# Patient Record
Sex: Male | Born: 1955 | Race: Black or African American | Hispanic: No | Marital: Married | Smoking: Former smoker
Health system: Southern US, Community
[De-identification: ages and names within clinical notes are randomized; demographics above are authoritative.]

## PROBLEM LIST (undated history)

## (undated) ENCOUNTER — Emergency Department (HOSPITAL_COMMUNITY): Payer: Medicare Other | Source: Home / Self Care

## (undated) DIAGNOSIS — G4730 Sleep apnea, unspecified: Secondary | ICD-10-CM

## (undated) DIAGNOSIS — T7840XA Allergy, unspecified, initial encounter: Secondary | ICD-10-CM

## (undated) DIAGNOSIS — H269 Unspecified cataract: Secondary | ICD-10-CM

## (undated) DIAGNOSIS — E669 Obesity, unspecified: Secondary | ICD-10-CM

## (undated) DIAGNOSIS — N529 Male erectile dysfunction, unspecified: Secondary | ICD-10-CM

## (undated) DIAGNOSIS — J302 Other seasonal allergic rhinitis: Secondary | ICD-10-CM

## (undated) DIAGNOSIS — C801 Malignant (primary) neoplasm, unspecified: Secondary | ICD-10-CM

## (undated) DIAGNOSIS — I10 Essential (primary) hypertension: Secondary | ICD-10-CM

## (undated) DIAGNOSIS — K648 Other hemorrhoids: Secondary | ICD-10-CM

## (undated) DIAGNOSIS — Z8619 Personal history of other infectious and parasitic diseases: Secondary | ICD-10-CM

## (undated) DIAGNOSIS — M1710 Unilateral primary osteoarthritis, unspecified knee: Secondary | ICD-10-CM

## (undated) DIAGNOSIS — E119 Type 2 diabetes mellitus without complications: Secondary | ICD-10-CM

## (undated) HISTORY — DX: Sleep apnea, unspecified: G47.30

## (undated) HISTORY — PX: CARPAL TUNNEL RELEASE: SHX101

## (undated) HISTORY — PX: UMBILICAL HERNIA REPAIR: SHX196

## (undated) HISTORY — DX: Unilateral primary osteoarthritis, unspecified knee: M17.10

## (undated) HISTORY — DX: Male erectile dysfunction, unspecified: N52.9

## (undated) HISTORY — DX: Allergy, unspecified, initial encounter: T78.40XA

## (undated) HISTORY — DX: Essential (primary) hypertension: I10

## (undated) HISTORY — PX: HERNIA REPAIR: SHX51

## (undated) HISTORY — DX: Other seasonal allergic rhinitis: J30.2

## (undated) HISTORY — DX: Obesity, unspecified: E66.9

## (undated) HISTORY — DX: Type 2 diabetes mellitus without complications: E11.9

## (undated) HISTORY — DX: Unspecified cataract: H26.9

## (undated) SURGERY — Surgical Case
Anesthesia: *Unknown

---

## 1988-09-14 DIAGNOSIS — I10 Essential (primary) hypertension: Secondary | ICD-10-CM

## 1988-09-14 HISTORY — DX: Essential (primary) hypertension: I10

## 2002-09-14 DIAGNOSIS — G4730 Sleep apnea, unspecified: Secondary | ICD-10-CM

## 2002-09-14 HISTORY — DX: Sleep apnea, unspecified: G47.30

## 2003-11-16 ENCOUNTER — Ambulatory Visit (HOSPITAL_COMMUNITY): Admission: RE | Admit: 2003-11-16 | Discharge: 2003-11-16 | Payer: Self-pay | Admitting: General Surgery

## 2004-08-13 ENCOUNTER — Ambulatory Visit: Payer: Self-pay | Admitting: Family Medicine

## 2004-11-11 ENCOUNTER — Ambulatory Visit: Payer: Self-pay | Admitting: Family Medicine

## 2004-12-23 ENCOUNTER — Ambulatory Visit: Payer: Self-pay | Admitting: Family Medicine

## 2005-02-16 ENCOUNTER — Ambulatory Visit: Payer: Self-pay | Admitting: Family Medicine

## 2005-05-20 ENCOUNTER — Ambulatory Visit: Payer: Self-pay | Admitting: Family Medicine

## 2005-08-18 ENCOUNTER — Ambulatory Visit: Payer: Self-pay | Admitting: Family Medicine

## 2005-11-23 ENCOUNTER — Ambulatory Visit: Payer: Self-pay | Admitting: Family Medicine

## 2006-03-03 ENCOUNTER — Ambulatory Visit: Payer: Self-pay | Admitting: Internal Medicine

## 2006-03-26 ENCOUNTER — Ambulatory Visit: Payer: Self-pay | Admitting: Family Medicine

## 2006-05-26 ENCOUNTER — Encounter: Payer: Self-pay | Admitting: Family Medicine

## 2006-05-26 LAB — CONVERTED CEMR LAB: PSA: 1.05 ng/mL

## 2006-06-01 ENCOUNTER — Ambulatory Visit: Payer: Self-pay | Admitting: Family Medicine

## 2006-09-30 ENCOUNTER — Encounter: Payer: Self-pay | Admitting: Family Medicine

## 2006-09-30 LAB — CONVERTED CEMR LAB: Hgb A1c MFr Bld: 6 % (ref 4.6–6.1)

## 2006-10-04 ENCOUNTER — Ambulatory Visit: Payer: Self-pay | Admitting: Family Medicine

## 2007-01-28 ENCOUNTER — Ambulatory Visit: Payer: Self-pay | Admitting: Family Medicine

## 2007-05-12 ENCOUNTER — Ambulatory Visit: Payer: Self-pay | Admitting: Family Medicine

## 2007-08-17 ENCOUNTER — Ambulatory Visit: Payer: Self-pay | Admitting: Family Medicine

## 2007-08-17 LAB — CONVERTED CEMR LAB: PSA: 1.31 ng/mL

## 2007-08-18 ENCOUNTER — Encounter: Payer: Self-pay | Admitting: Family Medicine

## 2007-08-18 LAB — CONVERTED CEMR LAB
BUN: 15 mg/dL (ref 6–23)
Basophils Absolute: 0 10*3/uL (ref 0.0–0.1)
Basophils Relative: 0 % (ref 0–1)
CO2: 27 meq/L (ref 19–32)
Calcium: 9 mg/dL (ref 8.4–10.5)
Chloride: 102 meq/L (ref 96–112)
Creatinine, Ser: 0.96 mg/dL (ref 0.40–1.50)
Glucose, Bld: 98 mg/dL (ref 70–99)
HCT: 41.6 % (ref 39.0–52.0)
Hemoglobin: 14 g/dL (ref 13.0–17.0)
LDL Cholesterol: 91 mg/dL (ref 0–99)
Lymphocytes Relative: 35 % (ref 12–46)
Monocytes Relative: 9 % (ref 3–12)
Platelets: 223 10*3/uL (ref 150–400)
Potassium: 3.7 meq/L (ref 3.5–5.3)
RDW: 13.4 % (ref 11.5–15.5)
Sodium: 140 meq/L (ref 135–145)
TSH: 1.119 microintl units/mL (ref 0.350–5.50)
Triglycerides: 72 mg/dL (ref <150)
Uric Acid, Serum: 5.3 mg/dL (ref 2.4–7.0)
WBC: 3.7 10*3/uL — ABNORMAL LOW (ref 4.0–10.5)

## 2007-10-04 ENCOUNTER — Ambulatory Visit (HOSPITAL_COMMUNITY): Admission: RE | Admit: 2007-10-04 | Discharge: 2007-10-04 | Payer: Self-pay | Admitting: General Surgery

## 2007-11-09 ENCOUNTER — Ambulatory Visit: Payer: Self-pay | Admitting: Internal Medicine

## 2007-11-09 DIAGNOSIS — J302 Other seasonal allergic rhinitis: Secondary | ICD-10-CM

## 2007-11-09 DIAGNOSIS — G4733 Obstructive sleep apnea (adult) (pediatric): Secondary | ICD-10-CM

## 2007-11-09 DIAGNOSIS — I10 Essential (primary) hypertension: Secondary | ICD-10-CM | POA: Insufficient documentation

## 2007-11-09 DIAGNOSIS — G4710 Hypersomnia, unspecified: Secondary | ICD-10-CM | POA: Insufficient documentation

## 2007-11-09 HISTORY — DX: Obstructive sleep apnea (adult) (pediatric): G47.33

## 2007-11-28 ENCOUNTER — Ambulatory Visit: Payer: Self-pay | Admitting: Family Medicine

## 2007-12-02 ENCOUNTER — Encounter: Payer: Self-pay | Admitting: Family Medicine

## 2007-12-02 DIAGNOSIS — G5600 Carpal tunnel syndrome, unspecified upper limb: Secondary | ICD-10-CM

## 2007-12-09 ENCOUNTER — Ambulatory Visit: Payer: Self-pay | Admitting: Internal Medicine

## 2008-01-22 ENCOUNTER — Encounter: Payer: Self-pay | Admitting: Internal Medicine

## 2008-03-28 ENCOUNTER — Ambulatory Visit: Payer: Self-pay | Admitting: Family Medicine

## 2008-05-07 ENCOUNTER — Encounter: Payer: Self-pay | Admitting: Family Medicine

## 2008-06-11 ENCOUNTER — Ambulatory Visit: Payer: Self-pay | Admitting: Internal Medicine

## 2008-08-15 ENCOUNTER — Encounter: Payer: Self-pay | Admitting: Family Medicine

## 2008-08-17 ENCOUNTER — Encounter: Payer: Self-pay | Admitting: Family Medicine

## 2008-08-17 LAB — CONVERTED CEMR LAB
BUN: 13 mg/dL (ref 6–23)
Basophils Relative: 0 % (ref 0–1)
CO2: 23 meq/L (ref 19–32)
Chloride: 102 meq/L (ref 96–112)
Creatinine, Ser: 0.93 mg/dL (ref 0.40–1.50)
HDL: 41 mg/dL (ref >39)
LDL Cholesterol: 109 mg/dL — ABNORMAL HIGH (ref 0–99)
MCV: 88.1 fL (ref 78.0–100.0)
Monocytes Absolute: 0.3 10*3/uL (ref 0.1–1.0)
PSA: 1.79 ng/mL (ref 0.10–4.00)
Potassium: 3.4 meq/L — ABNORMAL LOW (ref 3.5–5.3)
Triglycerides: 80 mg/dL (ref <150)
Uric Acid, Serum: 5.5 mg/dL (ref 4.0–7.8)
VLDL: 16 mg/dL (ref 0–40)

## 2008-08-23 ENCOUNTER — Ambulatory Visit: Payer: Self-pay | Admitting: Family Medicine

## 2008-08-23 LAB — CONVERTED CEMR LAB: Glucose, Bld: 103 mg/dL

## 2008-08-28 ENCOUNTER — Encounter: Payer: Self-pay | Admitting: Family Medicine

## 2008-09-04 ENCOUNTER — Ambulatory Visit: Payer: Self-pay | Admitting: Internal Medicine

## 2008-09-17 ENCOUNTER — Encounter: Payer: Self-pay | Admitting: Family Medicine

## 2008-11-13 ENCOUNTER — Encounter: Payer: Self-pay | Admitting: Family Medicine

## 2008-11-27 ENCOUNTER — Encounter: Payer: Self-pay | Admitting: Family Medicine

## 2008-11-27 LAB — CONVERTED CEMR LAB
CO2: 25 meq/L (ref 19–32)
Chloride: 103 meq/L (ref 96–112)
Cholesterol: 141 mg/dL (ref 0–200)
Creatinine, Ser: 0.86 mg/dL (ref 0.40–1.50)
Glucose, Bld: 82 mg/dL (ref 70–99)
HDL: 38 mg/dL — ABNORMAL LOW (ref >39)
LDL Cholesterol: 89 mg/dL (ref 0–99)
Sodium: 140 meq/L (ref 135–145)
Total CHOL/HDL Ratio: 3.7
Triglycerides: 72 mg/dL (ref <150)

## 2008-12-12 ENCOUNTER — Encounter: Payer: Self-pay | Admitting: Family Medicine

## 2008-12-24 ENCOUNTER — Encounter: Payer: Self-pay | Admitting: Family Medicine

## 2009-01-03 ENCOUNTER — Ambulatory Visit: Payer: Self-pay | Admitting: Family Medicine

## 2009-04-12 ENCOUNTER — Encounter: Payer: Self-pay | Admitting: Family Medicine

## 2009-05-07 ENCOUNTER — Ambulatory Visit: Payer: Self-pay | Admitting: Family Medicine

## 2009-05-07 LAB — CONVERTED CEMR LAB: Hgb A1c MFr Bld: 6.4 %

## 2009-07-02 ENCOUNTER — Encounter: Payer: Self-pay | Admitting: Family Medicine

## 2009-08-21 ENCOUNTER — Ambulatory Visit: Payer: Self-pay | Admitting: Internal Medicine

## 2009-09-05 ENCOUNTER — Telehealth: Payer: Self-pay | Admitting: Internal Medicine

## 2009-09-11 ENCOUNTER — Telehealth (INDEPENDENT_AMBULATORY_CARE_PROVIDER_SITE_OTHER): Payer: Self-pay | Admitting: *Deleted

## 2009-09-14 HISTORY — PX: JOINT REPLACEMENT: SHX530

## 2009-10-16 LAB — CONVERTED CEMR LAB
BUN: 15 mg/dL (ref 6–23)
Calcium: 8.4 mg/dL (ref 8.4–10.5)
Chloride: 104 meq/L (ref 96–112)
Creatinine, Ser: 1 mg/dL (ref 0.40–1.50)
Creatinine, Urine: 342.5 mg/dL
Eosinophils Absolute: 0.1 10*3/uL (ref 0.0–0.7)
Glucose, Bld: 95 mg/dL (ref 70–99)
Hemoglobin: 13.3 g/dL (ref 13.0–17.0)
Lymphocytes Relative: 38 % (ref 12–46)
MCHC: 32.2 g/dL (ref 30.0–36.0)
MCV: 91.6 fL (ref 78.0–100.0)
Monocytes Absolute: 0.3 10*3/uL (ref 0.1–1.0)
Monocytes Relative: 9 % (ref 3–12)
PSA: 1.52 ng/mL (ref 0.10–4.00)
TSH: 1.297 microintl units/mL (ref 0.350–4.500)
WBC: 3.7 10*3/uL — ABNORMAL LOW (ref 4.0–10.5)

## 2009-10-21 ENCOUNTER — Ambulatory Visit: Payer: Self-pay | Admitting: Family Medicine

## 2009-10-30 DIAGNOSIS — M129 Arthropathy, unspecified: Secondary | ICD-10-CM

## 2009-11-03 ENCOUNTER — Encounter: Payer: Self-pay | Admitting: Internal Medicine

## 2010-01-27 ENCOUNTER — Telehealth: Payer: Self-pay | Admitting: Family Medicine

## 2010-02-11 ENCOUNTER — Encounter: Payer: Self-pay | Admitting: Family Medicine

## 2010-02-19 ENCOUNTER — Ambulatory Visit (HOSPITAL_COMMUNITY): Admission: RE | Admit: 2010-02-19 | Discharge: 2010-02-19 | Payer: Self-pay | Admitting: Family Medicine

## 2010-02-19 ENCOUNTER — Ambulatory Visit: Payer: Self-pay | Admitting: Family Medicine

## 2010-02-20 LAB — CONVERTED CEMR LAB: Vit D, 25-Hydroxy: 26 ng/mL — ABNORMAL LOW (ref 30–89)

## 2010-03-26 ENCOUNTER — Inpatient Hospital Stay (HOSPITAL_COMMUNITY): Admission: RE | Admit: 2010-03-26 | Discharge: 2010-03-28 | Payer: Self-pay | Admitting: Orthopedic Surgery

## 2010-06-14 HISTORY — PX: OTHER SURGICAL HISTORY: SHX169

## 2010-06-26 ENCOUNTER — Ambulatory Visit: Payer: Self-pay | Admitting: Family Medicine

## 2010-06-26 DIAGNOSIS — E669 Obesity, unspecified: Secondary | ICD-10-CM

## 2010-06-26 DIAGNOSIS — E1169 Type 2 diabetes mellitus with other specified complication: Secondary | ICD-10-CM | POA: Insufficient documentation

## 2010-06-27 ENCOUNTER — Encounter: Payer: Self-pay | Admitting: Family Medicine

## 2010-08-20 ENCOUNTER — Ambulatory Visit: Payer: Self-pay | Admitting: Internal Medicine

## 2010-09-23 ENCOUNTER — Telehealth: Payer: Self-pay | Admitting: Internal Medicine

## 2010-09-24 LAB — CONVERTED CEMR LAB
Bilirubin, Direct: 0.1 mg/dL (ref 0.0–0.3)
CO2: 27 meq/L (ref 19–32)
Cholesterol: 159 mg/dL (ref 0–200)
Creatinine, Ser: 0.96 mg/dL (ref 0.40–1.50)
Indirect Bilirubin: 0.6 mg/dL (ref 0.0–0.9)
LDL Cholesterol: 102 mg/dL — ABNORMAL HIGH (ref 0–99)
Potassium: 3.6 meq/L (ref 3.5–5.3)
Sodium: 139 meq/L (ref 135–145)
Total CHOL/HDL Ratio: 3.9
Total Protein: 7.4 g/dL (ref 6.0–8.3)
VLDL: 16 mg/dL (ref 0–40)

## 2010-10-07 ENCOUNTER — Ambulatory Visit
Admission: RE | Admit: 2010-10-07 | Discharge: 2010-10-07 | Payer: Self-pay | Source: Home / Self Care | Attending: Family Medicine | Admitting: Family Medicine

## 2010-10-14 NOTE — Progress Notes (Signed)
Summary: MURPHY/ WAINER  MURPHY/ Thurston [facility]   Imported By: Lind Guest 02/19/2010 09:54:22  _____________________________________________________________________  External Attachment:    Type:   Image     Comment:   External Document

## 2010-10-14 NOTE — Assessment & Plan Note (Signed)
Summary: F UP   Vital Signs:  Patient profile:   55 year old male Height:      66.5 inches Weight:      289.25 pounds BMI:     46.15 O2 Sat:      97 % Pulse rate:   96 / minute Pulse rhythm:   regular Resp:     16 per minute BP sitting:   120 / 82  (left arm) Cuff size:   large  Vitals Entered By: Everitt Amber LPN (June 26, 2010 7:58 AM)  Nutrition Counseling: Patient's BMI is greater than 25 and therefore counseled on weight management options. CC: Follow up chronic problems   Primary Care Provider:  M. [name]  CC:  Follow up chronic problems.  History of Present Illness: Reports  that he has been  doing well. Denies recent fever or chills. Denies sinus pressure, nasal congestion , ear pain or sore throat. Denies chest congestion, or cough productive of sputum. Denies chest pain, palpitations, PND, orthopnea or leg swelling. Denies abdominal pain, nausea, vomitting, diarrhea or constipation. Denies change in bowel movements or bloody stool. Denies dysuria , frequency, incontinence or hesitancy. Denies  joint pain, swelling, or reduced mobility.Reports good result from recent right knee replacement Denies headaches, vertigo, seizures. Denies depression, anxiety or insomnia. Denies  rash, lesions, or itch.     Allergies (verified): No Known Drug Allergies  Past History:  Past medical, surgical, family and social histories (including risk factors) reviewed, and no changes noted (except as noted below).  Past Medical History: Reviewed history from 06/11/2008 and no changes required. Allergic Rhinitis Diabetes Hypertension Sleep Apnea- NPSG 08/26/04 AHI 92/hr in [place]  Past Surgical History: Right Carpal tunnel 1994 Umbilical hernia 2005 Right knee replacement 2011  Family History: Reviewed history from 12/02/2007 and no changes required. MOTHER  DECEASED 77  HTN  LUNG DISEASE FATHER DECEASED 78 COLON CANCER HTN SEVEN SIBLINGS LIVING ONE  MS ONE CVA  UNDER AGE 88 HTN X 5 ONE MX3 NO CHILDREN  Social History: Reviewed history from 11/09/2007 and no changes required. Patient never smoked.  Pt is married. Pt works at a [employer].  Review of Systems      See HPI General:  Complains of fatigue. Eyes:  Complains of blurring and discharge. Endo:  Denies excessive thirst and excessive urination. Heme:  Denies abnormal bruising, bleeding, and skin discoloration. Allergy:  Denies itching eyes and persistent infections.  Physical Exam  General:  Well-developed,obese,in no acute distress; alert,appropriate and cooperative throughout examination HEENT: No facial asymmetry,  EOMI, No sinus tenderness, TM's Clear, oropharynx  pink and moist.   Chest: Clear to auscultation bilaterally.  CVS: S1, S2, No murmurs, No S3.   Abd: Soft, Nontender.  MS: Adequate ROM spine, hips, shoulders and  knees.  Ext: No edema.   CNS: CN 2-12 intact, power tone and sensation normal throughout.   Skin: Intact, no visible lesions or rashes.  Psych: Good eye contact, normal affect.  Memory intact, not anxious or depressed appearing.    Impression & Recommendations:  Problem # 1:  OBESITY (ICD-278.00) Assessment Deteriorated  Ht: 66.5 (06/26/2010)   Wt: 289.25 (06/26/2010)   BMI: 46.15 (06/26/2010) therapeutic lifestyle change discussed and encouraged  Problem # 2:  PREDIABETES (ICD-790.29) Assessment: Improved  The following medications were removed from the medication list:    Metformin Hcl 500 Mg Tb24 (Metformin hcl) .Marland Kitchen... Take 1 tablet by mouth two times a day  Orders: T- Hemoglobin A1C (  [phone]) T- Hemoglobin A1C ([id number])  Labs Reviewed: Creat: 1.00 (10/16/2009)     Problem # 3:  HYPERTENSION (ICD-401.9) Assessment: Improved  The following medications were removed from the medication list:    Procardia Xl 90 Mg Xr24h-tab (Nifedipine) ..... One tab by mouth qd His updated medication list for this problem  includes:    Nifedipine 90 Mg Tb24 (Nifedipine) .Marland Kitchen... Take 1 tablet by mouth once a day    Benazepril-hydrochlorothiazide 20-25 Mg Tabs (Benazepril-hydrochlorothiazide) .Marland Kitchen... Take 1 tablet by mouth once a day  Orders: T-Basic Metabolic Panel ([id number])  BP today: 120/82 Prior BP: 132/84 (02/19/2010)  Labs Reviewed: K+: 3.9 (10/16/2009) Creat: : 1.00 (10/16/2009)   Chol: 141 (11/27/2008)   HDL: 38 (11/27/2008)   LDL: 89 (11/27/2008)   TG: 72 (11/27/2008)  Complete Medication List: 1)  Nifedipine 90 Mg Tb24 (Nifedipine) .... Take 1 tablet by mouth once a day 2)  Benazepril-hydrochlorothiazide 20-25 Mg Tabs (Benazepril-hydrochlorothiazide) .... Take 1 tablet by mouth once a day 3)  Allopurinol 300 Mg Tabs (Allopurinol) .... Take 1 tablet by mouth once a day 4)  Meloxicam 15 Mg Tabs (Meloxicam) .... Take 1 tablet by mouth once a day 5)  Flonase 50 Mcg/act Susp (Fluticasone propionate) .Marland Kitchen.. 1 spray each nostril two times a day 6)  Provigil 200 Mg Tabs (Modafinil) .Marland Kitchen.. 1 daily as needed 7)  Cpap 16 Cwp [facility]  8)  Replacement Cpap Mask and Supplies   Other Orders: T-Hepatic Function [id number]) T-Lipid Profile ([id number]) Influenza Vaccine NON MCR (02725)  Patient Instructions: 1)  Please schedule a follow-up appointment in 3 months. 2)  It is important that you exercise regularly at least 20 minutes 5 times a week. If you develop chest pain, have severe difficulty breathing, or feel very tired , stop exercising immediately and seek medical attention. 3)  You need to lose weight. Consider a lower calorie diet and regular exercise. Goal is 11 pounds 4)  BMP prior to visit, ICD-9: 5)  Hepatic Panel prior to visit, ICD-9: 6)  Lipid Panel prior to visit, ICD-9:   fasting labs today 7)  HbgA1C prior to visit, ICD-9: 8)  Flu vac today. 9)  hBA1C in 3 months 10)  I am removing the dx of diabetes and calling you prediabetic as of today. 11)  pls stop the metformin, and I will send a  messager to the pharmacy not to refill 12)  I know you can do this. 13)  The medication list was reviewed and reconciled..All changed/newly prescribed medications were explained. A complete medication list was provided to the patient/caregiver.  14)  Test sugars at least 3 times weekly 15)  I am happy that your sugar went well   Influenza Vaccine    Vaccine Type: Fluvax Non-MCR    Site: right deltoid    Mfr: novartis    Dose: 0.5 ml    Route: IM    Given by: Everitt Amber LPN    Exp. Date: 01/2011    Lot #: [id number]

## 2010-10-14 NOTE — Letter (Signed)
Summary: med review sheet  med review sheet   Imported By: Rudene Anda 06/27/2010 16:52:05  _____________________________________________________________________  External Attachment:    Type:   Image     Comment:   External Document

## 2010-10-14 NOTE — Progress Notes (Signed)
Summary: referral  Phone Note Call from Patient   Summary of Call: pt requests appt with ortho, Dr Eulah Pont re bilateral knee pain and instability  which is worsening, he saw him years ago and only wants dr Eulah Pont Initial call taken by: Syliva Overman MD,  Jan 27, 2010 1:46 PM  Follow-up for Phone Call        pt has appt with dr. Eulah Pont office for 02/11/2010 8:45. pt aware and notified  Follow-up by: Rudene Anda,  Jan 28, 2010 8:56 AM

## 2010-10-14 NOTE — Letter (Signed)
Summary: [facility] pharmacy  [facility]   Imported By: Lind Guest 06/27/2010 08:00:31  _____________________________________________________________________  External Attachment:    Type:   Image     Comment:   External Document

## 2010-10-14 NOTE — Assessment & Plan Note (Signed)
Summary: office visit   Vital Signs:  Patient profile:   55 year old male Height:      66.5 inches Weight:      278 pounds BMI:     44.36 O2 Sat:      97 % Pulse rate:   66 / minute Pulse rhythm:   regular Resp:     16 per minute BP sitting:   132 / 84  (left arm) Cuff size:   large  Vitals Entered By: Everitt Amber LPN (February 20, 6212 12:55 PM)  Nutrition Counseling: Patient's BMI is greater than 25 and therefore counseled on weight management options. CC: Follow up chronic problems   Primary Care Provider:  M. Lynne Takemoto  CC:  Follow up chronic problems.  History of Present Illness: Reports  that he ahs been  doing well. Denies recent fever or chills. Denies sinus pressure, nasal congestion , ear pain or sore throat. Denies chest congestion, or cough productive of sputum. Denies chest pain, palpitations, PND, orthopnea or leg swelling. Denies abdominal pain, nausea, vomitting, diarrhea or constipation. Denies change in bowel movements or bloody stool. Denies dysuria , frequency, incontinence or hesitancy. Reports increased right knee pain, swelling and reduced mobility has surgery upcoming. Denies headaches, vertigo, seizures. Denies depression, anxiety or insomnia. Denies  rash, lesions, or itch. He tests his blood sugards regularely, once daily and the fasting sugars are seldom over 110 He has been diligent in dietary change with excellent weight loss results.     Current Medications (verified): 1)  Nifedipine 90 Mg  Tb24 (Nifedipine) .... Take 1 Tablet By Mouth Once A Day 2)  Benazepril-Hydrochlorothiazide 20-25 Mg  Tabs (Benazepril-Hydrochlorothiazide) .... Take 1 Tablet By Mouth Once A Day 3)  Metformin Hcl 500 Mg  Tb24 (Metformin Hcl) .... Take 1 Tablet By Mouth Two Times A Day 4)  Allopurinol 300 Mg  Tabs (Allopurinol) .... Take 1 Tablet By Mouth Once A Day 5)  Meloxicam 15 Mg  Tabs (Meloxicam) .... Take 1 Tablet By Mouth Once A Day 6)  Flonase 50 Mcg/act  Susp  (Fluticasone Propionate) .Marland Kitchen.. 1 Spray Each Nostril Two Times A Day 7)  Provigil 200 Mg  Tabs (Modafinil) .Marland Kitchen.. 1 Daily As Needed 8)  Cpap 16 Cwp [facility] 9)  Replacement Cpap Mask and Supplies 10)  Procardia Xl 90 Mg Xr24h-Tab (Nifedipine) .... One Tab By Mouth Qd  Allergies (verified): No Known Drug Allergies  Review of Systems      See HPI Eyes:  Denies blurring and discharge. Endo:  Denies cold intolerance, excessive hunger, excessive thirst, excessive urination, heat intolerance, polyuria, and weight change. Heme:  Denies abnormal bruising and bleeding. Allergy:  Complains of seasonal allergies.  Physical Exam  General:  alert, well-hydrated, and overweight-appearing. HEENT: No facial asymmetry,  EOMI, No sinus tenderness, TM's Clear, oropharynx  pink and moist.   Chest: Clear to auscultation bilaterally.  CVS: S1, S2, No murmurs, No S3.   Abd: Soft, Nontender.  MS: Adequate ROM spine, hips, shoulders and reduced inknees.  Ext: No edema.   CNS: CN 2-12 intact, power tone and sensation normal throughout.   Skin: Intact, tinea pedis bilaterally Psych: Good eye contact, normal affect.  Memory intact, not anxious or depressed appearing.     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  Take 1 tablet by mouth once a day    Benazepril-hydrochlorothiazide 20-25 Mg Tabs (Benazepril-hydrochlorothiazide) .Marland Kitchen... Take 1 tablet by mouth once a day    Procardia Xl 90 Mg Xr24h-tab (Nifedipine) ..... 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 Take 1 tablet by mouth once a day    Metformin Hcl 500 Mg Tb24 (Metformin hcl) .Marland Kitchen... Take 1 tablet by mouth two times a day  Orders: T- Hemoglobin A1C ([id number])  Labs Reviewed: Creat: 1.00 (10/16/2009)    Reviewed HgBA1c results: 5.7 (10/21/2009)  6.4 (05/07/2009)  Complete Medication List: 1)  Nifedipine 90 Mg Tb24 (Nifedipine) .... Take 1 tablet by mouth once a day 2)  Benazepril-hydrochlorothiazide 20-25 Mg Tabs (Benazepril-hydrochlorothiazide) .... Take 1 tablet by mouth once a day 3)  Metformin Hcl 500 Mg Tb24 (Metformin hcl) .... Take 1 tablet by mouth two times a day 4)  Allopurinol 300 Mg Tabs (Allopurinol) .... Take 1 tablet by mouth once a day 5)  Meloxicam 15 Mg Tabs (Meloxicam) .... Take 1 tablet by mouth once a day 6)  Flonase 50 Mcg/act Susp (Fluticasone propionate) .Marland Kitchen.. 1 spray each nostril two times a day 7)  Provigil 200 Mg Tabs (Modafinil) .Marland Kitchen.. 1 daily as needed 8)  Cpap 16 Cwp [facility]  9)  Replacement Cpap Mask and Supplies  10)  Procardia Xl 90 Mg Xr24h-tab (Nifedipine) .... One tab by mouth qd  Other Orders: T-Vitamin D (25-Hydroxy) ([id number])  Patient Instructions: 1)  Please schedule a  follow-up appointment in 4 months. 2)  It is important that you exercise regularly at least 20 minutes 5 times a week. If you develop chest pain, have severe difficulty breathing, or feel very tired , stop exercising immediately and seek medical attention. 3)  You need to lose weight. Consider a lower calorie diet and regular exercise. You are doing exceptionally well, pls keep it up. 4)  HbgA1C prior to visit, ICD-9: 5)  Vitamin d   today 6)  cXR 7)  Reduce metformin to ONE tablet daily.

## 2010-10-14 NOTE — Assessment & Plan Note (Signed)
Summary: OFFICE VISIT   Vital Signs:  Patient profile:   55 year old male Height:      66.5 inches Weight:      284.75 pounds BMI:     45.43 O2 Sat:      97 % Pulse rate:   82 / minute Resp:     16 per minute BP sitting:   134 / 88  Vitals Entered By: Everitt Amber (October 21, 2009 2:09 PM) CC: Follow up chronic problems Is Patient Diabetic? Yes   Primary Care Provider:  M. Zahir Eisenhour  CC:  Follow up chronic problems.  History of Present Illness: Reports  that he has been  doing well. Denies recent fever or chills. Denies sinus pressure, nasal congestion , ear pain or sore throat. Denies chest congestion, or cough productive of sputum. Denies chest pain, palpitations, PND, orthopnea or leg swelling. Denies abdominal pain, nausea, vomitting, diarrhea or constipation. Denies change in bowel movements or bloody stool. Denies dysuria , frequency, incontinence or hesitancy. Denies  joint pain, swelling, or reduced mobility. Denies headaches, vertigo, seizures. Denies depression, anxiety or insomnia. Denies  rash, lesions, or itch.     Current Medications (verified): 1)  Nifedipine 90 Mg  Tb24 (Nifedipine) .... Take 1 Tablet By Mouth Once A Day 2)  Benazepril-Hydrochlorothiazide 20-25 Mg  Tabs (Benazepril-Hydrochlorothiazide) .... Take 1 Tablet By Mouth Once A Day 3)  Metformin Hcl 500 Mg  Tb24 (Metformin Hcl) .... Take 1 Tablet By Mouth Two Times A Day 4)  Allopurinol 300 Mg  Tabs (Allopurinol) .... Take 1 Tablet By Mouth Once A Day 5)  Meloxicam 15 Mg  Tabs (Meloxicam) .... Take 1 Tablet By Mouth Once A Day 6)  Flonase 50 Mcg/act  Susp (Fluticasone Propionate) .Marland Kitchen.. 1 Spray Each Nostril Two Times A Day 7)  Astepro 137 Mcg/spray Soln (Azelastine Hcl) .Marland Kitchen.. 1-2 Sprays Each Nostril Up To Twice Daily As Needed 8)  Provigil 200 Mg  Tabs (Modafinil) .Marland Kitchen.. 1 Daily As Needed 9)  Cpap 16 Cwp [facility] 10)  Replacement Cpap Mask and Supplies 11)  Procardia Xl 90 Mg Xr24h-Tab (Nifedipine)  .... One Tab By Mouth Qd  Allergies (verified): No Known Drug Allergies  Review of Systems      See HPI Eyes:  Denies blurring and discharge. Endo:  Denies cold intolerance, excessive hunger, excessive thirst, excessive urination, heat intolerance, polyuria, and weight change. Heme:  Denies abnormal bruising and bleeding. Allergy:  Complains of seasonal allergies; denies hives or rash.  Physical Exam  General:  alert, well-hydrated, and overweight-appearing. HEENT: No facial asymmetry,  EOMI, No sinus tenderness, TM's Clear, oropharynx  pink and moist.   Chest: Clear to auscultation bilaterally.  CVS: S1, S2, No murmurs, No S3.   Abd: Soft, Nontender.  MS: Adequate ROM spine, hips, shoulders and reduced inknees.  Ext: No edema.   CNS: CN 2-12 intact, power tone and sensation normal throughout.   Skin: Intact, tinea pedis bilaterally Psych: Good eye contact, normal affect.  Memory intact, not anxious or depressed appearing.      Impression & Recommendations:  Problem # 1:  DIABETES MELLITUS, TYPE II, WITHOUT COMPLICATIONS (ICD-250.00) Assessment Improved  His updated medication list for this problem includes:    Benazepril-hydrochlorothiazide 20-25 Mg Tabs (Benazepril-hydrochlorothiazide) .Marland Kitchen... Take 1 tablet by mouth once a day    Metformin Hcl 500 Mg Tb24 (Metformin hcl) .Marland Kitchen... Take 1 tablet by mouth two times a day  Orders: Glucose, (CBG) (82962) Hemoglobin A1C (83036)  Labs  Reviewed: Creat: 1.00 (10/16/2009)    Reviewed HgBA1c results: 5.7 (10/21/2009)  6.4 (05/07/2009)  Problem # 2:  HYPERTENSION (ICD-401.9) Assessment: Unchanged  The following medications were removed from the medication list:    Adalat Cc 90 Mg Xr24h-tab (Nifedipine) ..... One tab by mouth qd His updated medication list for this problem includes:    Nifedipine 90 Mg Tb24 (Nifedipine) .Marland Kitchen... Take 1 tablet by mouth once a day    Benazepril-hydrochlorothiazide 20-25 Mg Tabs  (Benazepril-hydrochlorothiazide) .Marland Kitchen... Take 1 tablet by mouth once a day    Procardia Xl 90 Mg Xr24h-tab (Nifedipine) ..... One tab by mouth qd  BP today: 134/88 Prior BP: 136/88 (08/21/2009)  Labs Reviewed: K+: 3.9 (10/16/2009) Creat: : 1.00 (10/16/2009)   Chol: 141 (11/27/2008)   HDL: 38 (11/27/2008)   LDL: 89 (11/27/2008)   TG: 72 (11/27/2008)  Problem # 3:  OBESITY (ICD-278.00) Assessment: Improved  Ht: 66.5 (10/21/2009)   Wt: 284.75 (10/21/2009)   BMI: 45.43 (10/21/2009)  Problem # 4:  ALLERGIC RHINITIS (ICD-477.9) Assessment: Unchanged  His updated medication list for this problem includes:    Flonase 50 Mcg/act Susp (Fluticasone propionate) .Marland Kitchen... 1 spray each nostril two times a day    Astepro 137 Mcg/spray Soln (Azelastine hcl) .Marland Kitchen... 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 Take 1 tablet by mouth once a day 2)  Benazepril-hydrochlorothiazide 20-25 Mg Tabs (Benazepril-hydrochlorothiazide) .... Take 1 tablet by mouth once a day 3)  Metformin Hcl 500 Mg Tb24 (Metformin hcl) .... Take 1 tablet by mouth two times a day 4)  Allopurinol 300 Mg Tabs (Allopurinol) .... Take 1 tablet by mouth once a day 5)  Meloxicam 15 Mg Tabs (Meloxicam) .... Take 1 tablet by mouth once a day 6)  Flonase 50 Mcg/act Susp (Fluticasone propionate) .Marland Kitchen.. 1 spray each nostril two times a day 7)  Astepro 137 Mcg/spray Soln (Azelastine hcl) .Marland Kitchen.. 1-2 sprays each nostril up to twice daily as needed 8)  Provigil 200 Mg Tabs (Modafinil) .Marland Kitchen.. 1 daily as needed 9)  Cpap 16 Cwp [facility]  10)  Replacement Cpap Mask and Supplies  11)  Procardia Xl 90 Mg Xr24h-tab (Nifedipine) .... One tab by mouth qd  Patient Instructions: 1)  F/U in 3.5 to 4 months 2)  Congrats on weight loss.TEN pOUNDS!!! 3)  Your blood pressure, blood sugar and cholesterol  arer all  much better, they are great , keep it up. 4)  It is important that you exercise regularly at least 20 minutes 5 times a week. If you develop chest pain, have severe difficulty breathing, or feel very tired , stop exercising immediately and seek medical attention. 5)  You need to lose weight. Consider a lower calorie diet and regular exercise.    Laboratory Results   Blood Tests   Date/Time Received: October 21, 2009  Date/Time Reported: October 21, 2009   Glucose (random): 141 mg/dL   (Normal Range: 78-295) HGBA1C: 5.7%   (Normal Range: Non-Diabetic - 3-6%   Control Diabetic - 6-8%)

## 2010-10-16 NOTE — Assessment & Plan Note (Signed)
Summary: rov 1 yr ///kp   Primary Provider/Referring Provider:  M. [name]  CC:  Yearly follow up visit-sleep apnea-using CPAP each night; needs new supplies..  History of Present Illness: 06/11/08- Provigil expensive. No snore through cpap. Compliant. weight gain. some days sleepy, working 8 hrs 5-6 days/wk. Denies cataplexy.  09/04/08- OSA, rhinitis Continues compliant cpap without change. Takes a little while to deal with stuffy nose. Uses Flonase and Astelin. Needs replacement cpap mask-  needing to tighten harder lately. Does comment on exertional painless dyspnea climbing stairs. Working 6-7 days/ week- glad to have a job. sleep hygiene. Dieting has lost 9 lbs.  August 21, 2009- OSA, rhinitis Uses CPAP all night every night. Wife tells him he seems to sleep well. Bedtime 930PM, up at 4AM. On weekends he sleeps in for another hour and that helps some. Seldom naps. No caffeine. Provigil didn't seem to work in past trial. His machine is 54 years old and beginning to sound/ run differently.  August 20, 2010-  OSA, rhinitis Nurse-CC: Yearly follow up visit-sleep apnea-using CPAP each night; needs new supplies. Using CPAP 16 every night- good control, sleeps well.  Since last here had right TKR with no respiratory complications. working on weight and able to come off DM meds. Minor cough blamed on leaf mold. No problem with benazepril.  Hasn't need Provigil lately. Used flonase for a month earlier in the fall.    Preventive Screening-Counseling & Management  Alcohol-Tobacco     Smoking Status: never  Current Medications (verified): 1)  Nifedipine 90 Mg  Tb24 (Nifedipine) .... Take 1 Tablet By Mouth Once A Day 2)  Benazepril-Hydrochlorothiazide 20-25 Mg  Tabs (Benazepril-Hydrochlorothiazide) .... Take 1 Tablet By Mouth Once A Day 3)  Allopurinol 300 Mg  Tabs (Allopurinol) .... Take 1 Tablet By Mouth Once A Day 4)  Meloxicam 15 Mg  Tabs (Meloxicam) .... Take 1 Tablet By Mouth Once A  Day 5)  Flonase 50 Mcg/act  Susp (Fluticasone Propionate) .Marland Kitchen.. 1 Spray Each Nostril Two Times A Day 6)  Provigil 200 Mg  Tabs (Modafinil) .Marland Kitchen.. 1 Daily As Needed 7)  Cpap 16 Cwp Genesis 8)  Replacement Cpap Mask and Supplies  Allergies (verified): No Known Drug Allergies  Past History:  Past Medical History: Last updated: 06/11/2008 Allergic Rhinitis Diabetes Hypertension Sleep Apnea- NPSG 08/26/04 AHI 92/hr in [place]  Past Surgical History: Last updated: 06/26/2010 Right Carpal tunnel 1994 Umbilical hernia 2005 Right knee replacement 2011  Family History: Last updated: Dec 31, 2007 MOTHER  DECEASED 76  HTN  LUNG DISEASE FATHER DECEASED 78 COLON CANCER HTN SEVEN SIBLINGS LIVING ONE  MS ONE CVA UNDER AGE 45 HTN X 5 ONE MX3 NO CHILDREN  Social History: Last updated: 11/09/2007 Patient never smoked.  Pt is married. Pt works at a [employer].  Risk Factors: Smoking Status: never (08/20/2010)  Review of Systems      See HPI       The patient complains of nasal congestion/difficulty breathing through nose and sneezing.  The patient denies shortness of breath with activity, shortness of breath at rest, productive cough, non-productive cough, coughing up blood, chest pain, irregular heartbeats, acid heartburn, indigestion, loss of appetite, weight change, abdominal pain, difficulty swallowing, sore throat, tooth/dental problems, and headaches.    Vital Signs:  Patient profile:   55 year old male Height:      66.5 inches Weight:      291.50 pounds BMI:     46.51 O2 Sat:  99 % on Room air Pulse rate:   71 / minute BP sitting:   134 / 78  (left arm) Cuff size:   large  Vitals Entered By: Reynaldo Minium CMA (August 20, 2010 10:24 AM)  O2 Flow:  Room air CC: Yearly follow up visit-sleep apnea-using CPAP each night; needs new supplies.   Physical Exam  Mouth:  postnasal drip.   Additional Exam:  General: A/Ox3; pleasant and cooperative, NAD, obese,  not sleepy appearing. SKIN: no rash, lesions NODES: no lymphadenopathy HEENT: [place]/AT, EOM- WNL, Conjuctivae- clear, PERRLA, TM-WNL, Nose- clear, Throat- clear and wnl, Mallampati  IV NECK: Supple w/ fair ROM, JVD- none, normal carotid impulses w/o bruits Thyroid- normal to palpation CHEST: Clear to P&A HEART: RRR, no m/g/r heard ABDOMEN: Soft and nl;  WUJ:WJXB, nl pulses,edema right lower leg, Neg Homan's NEURO: Grossly intact to observation      Impression & Recommendations:  Problem # 1:  SLEEP APNEA (ICD-780.57)  Good compliance and control on CPAP at 16. He definitely sleeps better. Weight loss would help substantially.  We reiewed sleep hygiene, the medical goals of CPAP therapy, and some treatment alternatives.  Problem # 2:  ALLERGIC RHINITIS (ICD-477.9) We compared his nasal steroid spray with alternatives.  His updated medication list for this problem includes:    Flonase 50 Mcg/act Susp (Fluticasone propionate) .Marland Kitchen... 1 spray each nostril two times a day  Other Orders: Est. Patient Level IV (14782)  Patient Instructions: 1)  Please schedule a follow-up appointment in 1 year. 2)  continue CPAP at 16. 3)  Set your sights on losing about 20 lbs and I think that would make a real difference.

## 2010-10-16 NOTE — Progress Notes (Signed)
Summary: Replacement CPAP Supplies request  Phone Note Other Incoming   Summary of Call: [name] faxed request for script for CPAP supplies.   Follow-up for Phone Call        Rx for Replacement CPAP Supplies faxed to [facility]. Rhonda Cobb  September 24, 2010 9:45 AM     New/Updated Medications: * REPLACEMENT CPAP SUPPLIES  Prescriptions: REPLACEMENT CPAP SUPPLIES   #1 x prn   Entered and Authorized by:   Waymon Budge MD   Signed by:   Waymon Budge MD on 09/23/2010   Method used:   Printed then faxed to ...       [facility] [address] [phone]* (retail)       [address], [place]  [address]       Ph: [phone]       Fax: [phone]   RxID:   [id number]

## 2010-10-30 NOTE — Assessment & Plan Note (Signed)
Summary: F UP   Vital Signs:  Patient profile:   55 year old male Height:      66.5 inches Weight:      290.50 pounds BMI:     46.35 O2 Sat:      97 % Pulse rate:   76 / minute Pulse rhythm:   regular Resp:     16 per minute BP sitting:   120 / 82  (left arm) Cuff size:   large  Vitals Entered By: Everitt Amber LPN (October 07, 2010 8:12 AM)  Nutrition Counseling: Patient's BMI is greater than 25 and therefore counseled on weight management options. CC: Follow up chronic problems   Primary Care Provider:  M. Sevyn Markham  CC:  Follow up chronic problems.  History of Present Illness: Reports  that he has been doing well. Denies recent fever or chills. Denies sinus pressure, nasal congestion , ear pain or sore throat. Denies chest congestion, or cough productive of sputum. Denies chest pain, palpitations, PND, orthopnea or leg swelling. Denies abdominal pain, nausea, vomitting, diarrhea or constipation. Denies change in bowel movements or bloody stool. Denies dysuria , frequency, incontinence or hesitancy.  Denies headaches, vertigo, seizures. Denies depression, anxiety or insomnia. Denies  rash, lesions, or itch.     Preventive Screening-Counseling & Management      Drug Use:  no.    Current Medications (verified): 1)  Nifedipine 90 Mg  Tb24 (Nifedipine) .... Take 1 Tablet By Mouth Once A Day 2)  Benazepril-Hydrochlorothiazide 20-25 Mg  Tabs (Benazepril-Hydrochlorothiazide) .... Take 1 Tablet By Mouth Once A Day 3)  Allopurinol 300 Mg  Tabs (Allopurinol) .... Take 1 Tablet By Mouth Once A Day 4)  Meloxicam 15 Mg  Tabs (Meloxicam) .... Take 1 Tablet By Mouth Once A Day 5)  Flonase 50 Mcg/act  Susp (Fluticasone Propionate) .Marland Kitchen.. 1 Spray Each Nostril Two Times A Day 6)  Provigil 200 Mg  Tabs (Modafinil) .Marland Kitchen.. 1 Daily As Needed 7)  Cpap 16 Cwp Genesis 8)  Replacement Cpap Mask and Supplies 9)  Replacement Cpap Supplies  Allergies (verified): No Known Drug Allergies  Past  History:  Past Medical History: Allergic Rhinitis, worse in the spring and fall Diabetes, diet controlled in 02-15-2011 Hypertension Sleep Apnea- NPSG 08/26/04 AHI 92/hr in [place] obesity dyslipidemia  Family History: MOTHER  DECEASED 61  HTN  LUNG DISEASE FATHER DECEASED 59 COLON CANCER HTN SIX SIBLINGS LIVING, 2 bothers deceased, one bbrother died under age 40 with mI, sibling #2 died at age 45 in Feb 15, 2011 multiple health issues, exact cause unclear one sibling had a stroke under age 18   HTN X 5 ONE MX3 NO CHILDREN  Social History: Patient never smoked.  Pt is married. Pt works at a [employer]. Alcohol use-no Drug use-no Drug Use:  no  Review of Systems      See HPI General:  Complains of sleep disorder; imroved with new cOPAP. Eyes:  Complains of vision loss-both eyes. MS:  Complains of joint pain and stiffness; inc left knee pain and instability. Endo:  Denies cold intolerance, excessive hunger, excessive thirst, excessive urination, heat intolerance, and polyuria. Heme:  Denies abnormal bruising and bleeding. Allergy:  Complains of seasonal allergies.  Physical Exam  General:  Well-developed,obese,in no acute distress; alert,appropriate and cooperative throughout examination HEENT: No facial asymmetry,  EOMI, No sinus tenderness, TM's Clear, oropharynx  pink and moist.   Chest: Clear to auscultation bilaterally.  CVS: S1, S2, No murmurs, No S3.  Abd: Soft, Nontender.  MS: Adequate ROM spine, hips, shoulders and  knees.  Ext: No edema.   CNS: CN 2-12 intact, power tone and sensation normal throughout.   Skin: Intact, no visible lesions or rashes.  Psych: Good eye contact, normal affect.  Memory intact, not anxious or depressed appearing.    Impression & Recommendations:  Problem # 1:  PREDIABETES (ICD-790.29) Assessment Improved  Orders: T- Hemoglobin A1C ([id number])  Problem # 2:  HYPERTENSION (ICD-401.9) Assessment: Improved  His updated  medication list for this problem includes:    Nifedipine 90 Mg Tb24 (Nifedipine) .Marland Kitchen... Take 1 tablet by mouth once a day    Benazepril-hydrochlorothiazide 20-25 Mg Tabs (Benazepril-hydrochlorothiazide) .Marland Kitchen... Take 1 tablet by mouth once a day  Orders: T-Basic Metabolic Panel ([id number])  BP today: 120/82 Prior BP: 134/78 (08/20/2010)  Labs Reviewed: K+: 3.6 (06/26/2010) Creat: : 0.96 (06/26/2010)   Chol: 159 (06/26/2010)   HDL: 41 (06/26/2010)   LDL: 102 (06/26/2010)   TG: 82 (06/26/2010)  Problem # 3:  OBESITY (ICD-278.00) Assessment: Improved  Orders: T-Lipid Profile ([id number])  Ht: 66.5 (10/07/2010)   Wt: 290.50 (10/07/2010)   BMI: 46.35 (10/07/2010)  Problem # 4:  ERECTILE DYSFUNCTION, ORGANIC (ICD-607.84) Assessment: Deteriorated  His updated medication list for this problem includes:    Viagra 100 Mg Tabs (Sildenafil citrate) ..... One tablet once daily to be used 30 minutes before sexual activity  Discussed proper use of medications, as well as side effects.   Problem # 5:  ALLERGIC RHINITIS (ICD-477.9) Assessment: Unchanged  His updated medication list for this problem includes:    Flonase 50 Mcg/act Susp (Fluticasone propionate) .Marland Kitchen... 1 spray each nostril two times a day  Complete Medication List: 1)  Nifedipine 90 Mg Tb24 (Nifedipine) .... Take 1 tablet by mouth once a day 2)  Benazepril-hydrochlorothiazide 20-25 Mg Tabs (Benazepril-hydrochlorothiazide) .... Take 1 tablet by mouth once a day 3)  Allopurinol 300 Mg Tabs (Allopurinol) .... Take 1 tablet by mouth once a day 4)  Meloxicam 15 Mg Tabs (Meloxicam) .... Take 1 tablet by mouth once a day 5)  Flonase 50 Mcg/act Susp (Fluticasone propionate) .Marland Kitchen.. 1 spray each nostril two times a day 6)  Provigil 200 Mg Tabs (Modafinil) .Marland Kitchen.. 1 daily as needed 7)  Cpap 16 Cwp Genesis  8)  Replacement Cpap Mask and Supplies  9)  Replacement Cpap Supplies  10)  Viagra 100 Mg Tabs (Sildenafil citrate) .... One tablet once  daily to be used 30 minutes before sexual activity  Other Orders: T-CBC w/Diff ([id number]) T-TSH [phone]) T-PSA ([id number]) T-Uric Acid (Blood) ([id number])  Patient Instructions: 1)  Please schedule a follow-up appointment in 4.5 months. 2)  It is important that you exercise regularly at least 45 minutes6 times a week. If you develop chest pain, have severe difficulty breathing, or feel very tired , stop exercising immediately and seek medical attention. 3)  You need to lose weight. Consider a lower calorie diet and regular exercise. goal is 9 to 12 pounds 4)  BMP prior to visit, ICD-9: 5)  Lipid Panel prior to visit, ICD-9: 6)  TSH prior to visit, ICD-9:  fasting March 1 or after 7)  CBC w/ Diff prior to visit, ICD-9: 8)  PSA prior to visit, ICD-9: 9)  uric acid level 10)  HbgA1C prior to visit, ICD-9: 11)  no med changes , alll the best for 2012 12)  We will refill all meds for the next 5 months Prescriptions: VIAGRA 100 MG TABS (SILDENAFIL  CITRATE) one tablet once daily to be used 30 minutes before sexual activity  #8 x 3   Entered and Authorized by:   Syliva Overman MD   Signed by:   Syliva Overman MD on 10/07/2010   Method used:   Electronically to        [facility] [address] [phone]* (retail)       [address] [place], [place]  [address]       Ph: [phone]       Fax: [phone]   RxID:   [id number]    Orders Added: 1)  Est. Patient Level IV [29518] 2)  T-Basic Metabolic Panel [phone] 3)  T-Lipid Profile [[id number]] 4)  T-CBC w/Diff [60109-32355] 5)  T-TSH [73220-25427] 6)  T-PSA [06237-62831] 7)  T-Uric Acid (Blood) [84550-23180] 8)  T- Hemoglobin A1C [83036-23375]   viagra 100mg  x 2 samples given   [id number] 7/15

## 2010-11-29 LAB — CBC
HCT: 35.4 % — ABNORMAL LOW (ref 39.0–52.0)
MCH: 31.5 pg (ref 26.0–34.0)
MCHC: 34.5 g/dL (ref 30.0–36.0)
MCV: 91.4 fL (ref 78.0–100.0)
Platelets: 155 10*3/uL (ref 150–400)
RDW: 13.3 % (ref 11.5–15.5)
WBC: 5.8 10*3/uL (ref 4.0–10.5)

## 2010-11-29 LAB — PROTIME-INR: INR: 1.21 (ref 0.00–1.49)

## 2010-11-29 LAB — BASIC METABOLIC PANEL
BUN: 11 mg/dL (ref 6–23)
CO2: 33 mEq/L — ABNORMAL HIGH (ref 19–32)
GFR calc Af Amer: >60 mL/min (ref >60)
Potassium: 3.4 mEq/L — ABNORMAL LOW (ref 3.5–5.1)
Sodium: 137 mEq/L (ref 135–145)

## 2010-11-29 LAB — GLUCOSE, CAPILLARY

## 2010-11-30 LAB — GLUCOSE, CAPILLARY
Glucose-Capillary: 104 mg/dL — ABNORMAL HIGH (ref 70–99)
Glucose-Capillary: 114 mg/dL — ABNORMAL HIGH (ref 70–99)
Glucose-Capillary: 156 mg/dL — ABNORMAL HIGH (ref 70–99)

## 2010-11-30 LAB — COMPREHENSIVE METABOLIC PANEL
AST: 22 U/L (ref 0–37)
BUN: 11 mg/dL (ref 6–23)
CO2: 30 mEq/L (ref 19–32)
Calcium: 9.4 mg/dL (ref 8.4–10.5)
Chloride: 99 mEq/L (ref 96–112)
Creatinine, Ser: 0.92 mg/dL (ref 0.4–1.5)
GFR calc Af Amer: >60 mL/min (ref >60)
Potassium: 3.2 mEq/L — ABNORMAL LOW (ref 3.5–5.1)
Total Bilirubin: 0.6 mg/dL (ref 0.3–1.2)
Total Protein: 7.5 g/dL (ref 6.0–8.3)

## 2010-11-30 LAB — CBC
HCT: 38 % — ABNORMAL LOW (ref 39.0–52.0)
Hemoglobin: 13.1 g/dL (ref 13.0–17.0)
Hemoglobin: 15 g/dL (ref 13.0–17.0)
MCH: 31.4 pg (ref 26.0–34.0)
MCHC: 34.3 g/dL (ref 30.0–36.0)
MCV: 91.5 fL (ref 78.0–100.0)
MCV: 91.7 fL (ref 78.0–100.0)
RDW: 13.6 % (ref 11.5–15.5)
WBC: 4.9 10*3/uL (ref 4.0–10.5)
WBC: 5.3 10*3/uL (ref 4.0–10.5)

## 2010-11-30 LAB — URINALYSIS, ROUTINE W REFLEX MICROSCOPIC
Ketones, ur: NEGATIVE mg/dL
Nitrite: NEGATIVE
Protein, ur: NEGATIVE mg/dL
Specific Gravity, Urine: 1.012 (ref 1.005–1.030)
pH: 7.5 (ref 5.0–8.0)

## 2010-11-30 LAB — BASIC METABOLIC PANEL
BUN: 11 mg/dL (ref 6–23)
Chloride: 97 mEq/L (ref 96–112)
Glucose, Bld: 136 mg/dL — ABNORMAL HIGH (ref 70–99)
Potassium: 3.9 mEq/L (ref 3.5–5.1)
Sodium: 137 mEq/L (ref 135–145)

## 2010-11-30 LAB — TYPE AND SCREEN: ABO/RH(D): A POS

## 2010-12-19 ENCOUNTER — Other Ambulatory Visit: Payer: Self-pay | Admitting: Family Medicine

## 2010-12-19 LAB — CBC WITH DIFFERENTIAL/PLATELET
Eosinophils Relative: 4 % (ref 0–5)
HCT: 41.5 % (ref 39.0–52.0)
Hemoglobin: 14.2 g/dL (ref 13.0–17.0)
Lymphocytes Relative: 31 % (ref 12–46)
Lymphs Abs: 1.3 10*3/uL (ref 0.7–4.0)
MCV: 88.7 fL (ref 78.0–100.0)
Monocytes Absolute: 0.4 10*3/uL (ref 0.1–1.0)
Monocytes Relative: 10 % (ref 3–12)
Neutro Abs: 2.2 10*3/uL (ref 1.7–7.7)
RBC: 4.68 MIL/uL (ref 4.22–5.81)
RDW: 13.3 % (ref 11.5–15.5)
WBC: 4 10*3/uL (ref 4.0–10.5)

## 2010-12-19 LAB — LIPID PANEL
LDL Cholesterol: 93 mg/dL (ref 0–99)
Triglycerides: 82 mg/dL (ref <150)
VLDL: 16 mg/dL (ref 0–40)

## 2010-12-19 LAB — BASIC METABOLIC PANEL
BUN: 16 mg/dL (ref 6–23)
CO2: 27 mEq/L (ref 19–32)
Chloride: 104 mEq/L (ref 96–112)
Creat: 0.99 mg/dL (ref 0.40–1.50)
Potassium: 3.8 mEq/L (ref 3.5–5.3)

## 2010-12-19 LAB — URIC ACID: Uric Acid, Serum: 5.4 mg/dL (ref 4.0–7.8)

## 2010-12-19 LAB — HEMOGLOBIN A1C
Hgb A1c MFr Bld: 6 % — ABNORMAL HIGH (ref <5.7)
Mean Plasma Glucose: 126 mg/dL — ABNORMAL HIGH (ref <117)

## 2011-01-27 NOTE — H&P (Signed)
NAME:  Christopher Lambert, Christopher Lambert                 ACCOUNT NO.:  [id number]   MEDICAL RECORD NO.:  [id number]          PATIENT TYPE:  AMB   LOCATION:  DAY                           FACILITY:  [facility]   PHYSICIAN:  Dalia Heading, M.D.  DATE OF BIRTH:  03-26-1956   DATE OF ADMISSION:  DATE OF DISCHARGE:  LH                              HISTORY & PHYSICAL   CHIEF COMPLAINT:  Family history of colon carcinoma.   HISTORY OF PRESENT ILLNESS:  The patient is a 55 year old black male who  is referred for endoscopic evaluation.  He needs a colonoscopy due to a  family history of colon carcinoma.  He last had a colonoscopy 10 years  ago.  No nausea, vomiting, diarrhea, constipation, melena, hematochezia  have been noted.   PAST MEDICAL HISTORY:  Includes hypertension, non-insulin-dependent  diabetes mellitus.   PAST SURGICAL HISTORY:  1. Carpal tunnel release.  2. Herniorrhaphies.   CURRENT MEDICATIONS:  Lotensin, Procardia, allopurinol, metformin, an  arthritis pill.   ALLERGIES:  No known drug allergies.   REVIEW OF SYSTEMS:  Noncontributory.   PHYSICAL EXAMINATION:  The patient is a well-developed, well-nourished  black male in no acute distress.  LUNGS:  Clear to auscultation with equal breath sounds bilaterally.  HEART:  Examination reveals a regular rate and rhythm without S3, S4, or  murmurs.  ABDOMEN:  Soft, nontender, nondistended.  No hepatosplenomegaly or  masses are noted.  RECTAL:  Examination was deferred to the procedure.   IMPRESSION:  Family history of colon carcinoma.   PLAN:  The patient is scheduled for a colonoscopy on October 04, 2007.  The risks and benefits of the procedure including bleeding and  perforation were fully explained to the patient, gave informed consent.      Dalia Heading, M.D.  Electronically Signed     MAJ/MEDQ  D:  09/29/2007  T:  09/30/2007  Job:  [id number]   cc:   [name]. Lodema Hong, M.D.  Fax: [phone]

## 2011-01-30 NOTE — H&P (Signed)
NAME:  Christopher Lambert, Christopher Lambert                           ACCOUNT NO.:  [id number]   MEDICAL RECORD NO.:  [id number]                   PATIENT TYPE:  AMB   LOCATION:  DAY                                  FACILITY:  [facility]   PHYSICIAN:  Dirk Dress. Katrinka Blazing, M.D.                DATE OF BIRTH:  1955/12/15   DATE OF ADMISSION:  DATE OF DISCHARGE:                                HISTORY & PHYSICAL   HISTORY OF PRESENT ILLNESS:  [age]-year-old male with a history of an  enlarging symptomatic mass of the umbilicus.  Patient is having pain and  discomfort.  The patient lifts 75-80 pounds daily multiple times during the  day and this seems to be aggravating the problem.  He is scheduled for  repair of an incarcerated umbilical hernia.   PAST HISTORY:  He has:  1. Hypertension.  2. Osteoarthritis.  3. Carpal tunnel syndrome.   MEDICATIONS:  1. Procardia XL 90 mg daily.  2. Lotensin 20/25 daily.  3. Mobic 7.5 mg daily.   ALLERGIES:  He has no known drug allergies.   SOCIAL HISTORY:  He does not drink, smoke or use drugs.  He is employed.   PHYSICAL EXAMINATION:  VITAL SIGNS:  Blood pressure 108/76, pulse 80,  respirations 20.  Weight 296 pounds.  HEENT:  Unremarkable.  NECK:  Neck is supple without JVD or bruit.  CHEST:  Chest clear to auscultation.  HEART:  Regular rate and rhythm without murmur, gallop or rub.  ABDOMEN:  Abdomen soft, nontender.  There is a large umbilical hernia with  incarceration of presumed fat which is exquisitely tender.  EXTREMITIES:  No cyanosis, clubbing or edema.  NEUROLOGIC:  No focal motor, sensory or cerebellar deficit.   IMPRESSION:  1. Incarcerated umbilical hernia.  2. Hypertension.  3. Osteoarthritis.  4. History of carpal tunnel syndrome.   PLAN:  Umbilical hernia repair.     ___________________________________________                                         Dirk Dress. Katrinka Blazing, M.D.   LCS/MEDQ  D:  11/15/2003  T:  11/16/2003  Job:  [id number]

## 2011-01-30 NOTE — Op Note (Signed)
NAME:  Christopher Lambert, Christopher Lambert                           ACCOUNT NO.:  [id number]   MEDICAL RECORD NO.:  [id number]                   PATIENT TYPE:  AMB   LOCATION:  DAY                                  FACILITY:  [facility]   PHYSICIAN:  Dirk Dress. Katrinka Blazing, M.D.                DATE OF BIRTH:  12/28/1955   DATE OF PROCEDURE:  DATE OF DISCHARGE:                                 OPERATIVE REPORT   PREOPERATIVE DIAGNOSES:  Incarcerated umbilical hernia.   POSTOPERATIVE DIAGNOSES:  Incarcerated umbilical hernia.   PROCEDURE:  Umbilical hernia repair.   SURGEON:  Dirk Dress. Katrinka Blazing, M.D.   DESCRIPTION OF PROCEDURE:  Under general anesthesia, the patient's abdomen  was prepped and draped in a sterile field. An infraumbilical incision was  made. The incision extended down to the subcutaneous tissue.  Dissection was  then continued down to the very thin sac which contained intraabdominal  contents. The sac was dissected away from the overlying skin.  It was then  entered. On inspection, it contained only omentum. The edges of the sac were  debrided. The opening in the fascia was extended transversely. The contents  were then pushed back into the peritoneal cavity. There did not seem to be  any bowel involvement.  The fascia was enclosed transversely using  interrupted #0 Prolene.  Eight sutures were used.  This seemed to affect a  tension free closure.  The umbilical skin was then sutured to the fascia  with running 3-0 Biosyn. The subcutaneous tissue was closed with 2-0 Biosyn.  The skin was closed with subcuticular 4-0 Dexon.  The area was infiltrated  with 30 mL of 0.5% Marcaine with epinephrine.  A dressing was placed. The  patient tolerated the procedure well. He was awakened from anesthesia  uneventfully, transferred to a bed and taken to the post anesthesia care  unit for further monitoring.      ___________________________________________                                            Dirk Dress. Katrinka Blazing,  M.D.   LCS/MEDQ  D:  11/16/2003  T:  11/16/2003  Job:  [id number]

## 2011-02-04 ENCOUNTER — Encounter: Payer: Self-pay | Admitting: Family Medicine

## 2011-02-05 ENCOUNTER — Encounter: Payer: Self-pay | Admitting: Family Medicine

## 2011-02-10 ENCOUNTER — Telehealth: Payer: Self-pay | Admitting: Internal Medicine

## 2011-02-10 ENCOUNTER — Encounter: Payer: Self-pay | Admitting: Family Medicine

## 2011-02-10 ENCOUNTER — Ambulatory Visit (INDEPENDENT_AMBULATORY_CARE_PROVIDER_SITE_OTHER): Payer: BC Managed Care – PPO | Admitting: Family Medicine

## 2011-02-10 DIAGNOSIS — E669 Obesity, unspecified: Secondary | ICD-10-CM

## 2011-02-10 DIAGNOSIS — I10 Essential (primary) hypertension: Secondary | ICD-10-CM

## 2011-02-10 DIAGNOSIS — R7309 Other abnormal glucose: Secondary | ICD-10-CM

## 2011-02-10 DIAGNOSIS — E119 Type 2 diabetes mellitus without complications: Secondary | ICD-10-CM

## 2011-02-10 DIAGNOSIS — G4710 Hypersomnia, unspecified: Secondary | ICD-10-CM

## 2011-02-10 MED ORDER — MELOXICAM 15 MG PO TABS: 15.0000 mg | ORAL_TABLET | Freq: Every day | ORAL | Status: DC

## 2011-02-10 MED ORDER — ALLOPURINOL 300 MG PO TABS: 300.0000 mg | ORAL_TABLET | Freq: Every day | ORAL | Status: DC

## 2011-02-10 MED ORDER — BENAZEPRIL-HYDROCHLOROTHIAZIDE 20-25 MG PO TABS: 1.0000 | ORAL_TABLET | Freq: Every day | ORAL | Status: DC

## 2011-02-10 MED ORDER — NIFEDIPINE 90 MG (OSM) PO TB24: 90.0000 mg | ORAL_TABLET | Freq: Every day | ORAL | Status: DC

## 2011-02-10 MED ORDER — FLUTICASONE PROPIONATE 50 MCG/ACT NA SUSP: 2.0000 | Freq: Every day | NASAL | Status: DC

## 2011-02-10 NOTE — Assessment & Plan Note (Signed)
Controlled, no change in medication  

## 2011-02-10 NOTE — Assessment & Plan Note (Signed)
Deteriorated. Patient re-educated about  the importance of commitment to a  minimum of 150 minutes of exercise per week. The importance of healthy food choices with portion control discussed. Encouraged to start a food diary, count calories and to consider  joining a support group. Sample diet sheets offered. Goals set by the patient for the next several months.    

## 2011-02-10 NOTE — Patient Instructions (Signed)
CPE in mid November  It is important that you exercise regularly at least 30 minutes 5 times a week. If you develop chest pain, have severe difficulty breathing, or feel very tired, stop exercising immediately and seek medical attention  A healthy diet is rich in fruit, vegetables and whole grains. Poultry fish, nuts and beans are a healthy choice for protein rather then red meat. A low sodium diet and drinking 64 ounces of water daily is generally recommended. Oils and sweet should be limited. Carbohydrates especially for those who are diabetic or overweight, should be limited to 34-45 gram per meal. It is important to eat on a regular schedule, at least 3 times daily. Snacks should be primarily fruits, vegetables or nuts. Weight loss goal of a minimum of 2 pounds per month.  pls call Dr young  About your sleep apnea.  Blood pressure is great , no med changes  Non Fasting labs in nov, hBA1c and chem 7

## 2011-02-10 NOTE — Assessment & Plan Note (Signed)
Deteriorated, HBA1C increased , aggressive attempts at weight loss and lifestyle change over the next 4 to 5 months

## 2011-02-10 NOTE — Telephone Encounter (Signed)
Spoke to pt and he states  everytime he takes the provigil it causes increased heart rate and he is not comfortable taking this please advise of what pt should do, will he be given a replacement to try or does he need ov to discuss, pt aware it will be tomorrow when we call him back and he states that will be fine

## 2011-02-10 NOTE — Progress Notes (Signed)
  Subjective:    Patient ID: Christopher Lambert, male    DOB: 11/15/1955, 55 y.o.   MRN: [id number]  HPI Pt has been having increased fatigue , saw his specialist in the fall, provigil was prescribed, had palpitations, stopped this, next f/u is in November, states he is nodding off on the job, will call to set up sooner appt today, and I have told him i will send a message to his provider. For annual exam next visit, weight loss goal of approx 2 pounds per month Left knee is still painful with intermittent instability, esp on concrete, awaiting surgery   Review of Systems See HPI Denies recent fever or chills. Denies sinus pressure, nasal congestion, ear pain or sore throat. Denies chest congestion, productive cough or wheezing. Denies chest pains, palpitations, paroxysmal nocturnal dyspnea, orthopnea and leg swelling Denies abdominal pain, nausea, vomiting,diarrhea or constipation.  Denies rectal bleeding or change in bowel movement. Denies dysuria, frequency, hesitancy or incontinence. Denies headaches, seizure, numbness, or tingling. Denies depression, anxiety or insomnia. Denies skin break down or rash.        Objective:   Physical Exam Patient alert and oriented and in no Cardiopulmonary distress.  HEENT: No facial asymmetry, EOMI, no sinus tenderness, TM's clear, Oropharynx pink and moist.  Neck supple no adenopathy.  Chest: Clear to auscultation bilaterally.  CVS: S1, S2 no murmurs, no S3.  ABD: Soft non tender. Bowel sounds normal.  Ext: No edema  MS: Adequate ROM spine, shoulders, hips and reduced in  knees.  Skin: Intact, no ulcerations or rash noted.  Psych: Good eye contact, normal affect. Memory intact not anxious or depressed appearing.  CNS: CN 2-12 intact, power, tone and sensation normal throughout.        Assessment & Plan:

## 2011-02-10 NOTE — Assessment & Plan Note (Signed)
Deteriorated, needs to sched appt with specialist for f/u and re-eval;uation

## 2011-02-11 NOTE — Telephone Encounter (Signed)
Provigil is less apt to cause rapid heart beat than the other alerting meds, so if he can't tolerate the provigil, I should see him to discuss. Routine ov.

## 2011-02-12 NOTE — Telephone Encounter (Signed)
Pt set to see CY tomorrow at 2:45. Carron Curie, CMA

## 2011-02-13 ENCOUNTER — Encounter: Payer: Self-pay | Admitting: Internal Medicine

## 2011-02-13 ENCOUNTER — Ambulatory Visit (INDEPENDENT_AMBULATORY_CARE_PROVIDER_SITE_OTHER): Payer: BC Managed Care – PPO | Admitting: Internal Medicine

## 2011-02-13 ENCOUNTER — Other Ambulatory Visit: Payer: Self-pay | Admitting: Family Medicine

## 2011-02-13 VITALS — BP 132/78 | HR 72 | Ht 67.5 in | Wt 297.6 lb

## 2011-02-13 DIAGNOSIS — I4891 Unspecified atrial fibrillation: Secondary | ICD-10-CM

## 2011-02-13 DIAGNOSIS — G4730 Sleep apnea, unspecified: Secondary | ICD-10-CM

## 2011-02-13 DIAGNOSIS — R059 Cough, unspecified: Secondary | ICD-10-CM | POA: Insufficient documentation

## 2011-02-13 DIAGNOSIS — G4710 Hypersomnia, unspecified: Secondary | ICD-10-CM

## 2011-02-13 DIAGNOSIS — R05 Cough: Secondary | ICD-10-CM

## 2011-02-13 NOTE — Patient Instructions (Signed)
Try a caffeine drink like diet Pepsi, coffee or tea occasionally if needed for alertness. You could also try an otc caffeine tablet like Nodoz  Continue CPAP at 16

## 2011-02-13 NOTE — Progress Notes (Signed)
  Subjective:    Patient ID: Christopher Lambert, male    DOB: 1956-09-07, 55 y.o.   MRN: [id number]  HPI 02/13/11- OSA, rhinitis Last here August 20, 2010. Note reviewed. Continues CPAP 16, all night every night, all night. Still occasional yawn and nod. Tried Provigil for residual sleepiness but it caused tachy palpitation. He has not been a coffee drinker.  Mild nasal congestion early Spring, managed otc, but ok now.   Review of Systems Constitutional:   No weight loss, night sweats,  Fevers, chills, fatigue, lassitude. HEENT:   No headaches,  Difficulty swallowing,  Tooth/dental problems,  Sore throat,                No sneezing, itching, ear ache, nasal congestion, post nasal drip,   CV:  No chest pain,  Orthopnea, PND, swelling in lower extremities, anasarca, dizziness, palpitations  GI  No heartburn, indigestion, abdominal pain, nausea, vomiting, diarrhea, change in bowel habits, loss of appetite  Resp: No shortness of breath with exertion or at rest.  No excess mucus, no productive cough,  No non-productive cough,  No coughing up of blood.  No change in color of mucus.  No wheezing.  Skin: no rash or lesions.  GU: no dysuria, change in color of urine, no urgency or frequency.  No flank pain.  MS:  No joint pain or swelling.  No decreased range of motion.  No back pain.  Psych:  No change in mood or affect. No depression or anxiety.  No memory loss.      Objective:   Physical Exam General- Alert, Oriented, Affect-appropriate, Distress- none acute  Overweight  Skin- rash-none, lesions- none, excoriation- none  Lymphadenopathy- none  Head- atraumatic  Eyes- Gross vision intact, PERRLA, conjunctivae clear secretions  Ears- Hearing, canals, Tm- normal  Nose- Clear, No-Septal dev, mucus, polyps, erosion, perforation   Throat- Mallampati II , mucosa clear , drainage- none, tonsils- atrophic  Neck- flexible , trachea midline, no stridor , thyroid nl, carotid no bruit  Chest -  symmetrical excursion , unlabored     Heart/CV- RRR , no murmur , no gallop  , no rub, nl s1 s2                     - JVD- none , edema- none, stasis changes- none, varices- none     Lung- clear to P&A, wheeze- none, cough- none , dullness-none, rub- none     Chest wall- Abd- tender-no, distended-no, bowel sounds-present,   HSM- no  Br/ Gen/ Rectal- Not done, not indicated  Extrem- cyanosis- none, clubbing, none, atrophy- none, strength- nl  Neuro- grossly intact to observation        Assessment & Plan:

## 2011-02-13 NOTE — Assessment & Plan Note (Signed)
Good compliance and control with CPAP 16. Mild residual sleepiness, but intilerant of provigil. We discussed light use of caffeien and naps.

## 2011-02-15 ENCOUNTER — Encounter: Payer: Self-pay | Admitting: Internal Medicine

## 2011-02-15 NOTE — Assessment & Plan Note (Signed)
Not active complaint- controlled

## 2011-02-15 NOTE — Assessment & Plan Note (Addendum)
Had used occasional Provigil for residual somnolence despite CPAP, but it caused uncomfortable tachypalpitation. We explained other stimulants could be worse.  To emphasize naps and occasional caffeine

## 2011-05-07 ENCOUNTER — Encounter: Payer: Self-pay | Admitting: Family Medicine

## 2011-05-08 ENCOUNTER — Encounter: Payer: Self-pay | Admitting: Family Medicine

## 2011-05-08 ENCOUNTER — Ambulatory Visit (INDEPENDENT_AMBULATORY_CARE_PROVIDER_SITE_OTHER): Payer: BC Managed Care – PPO | Admitting: Family Medicine

## 2011-05-08 VITALS — BP 128/80 | HR 69 | Resp 16 | Ht 67.5 in | Wt 294.0 lb

## 2011-05-08 DIAGNOSIS — R7309 Other abnormal glucose: Secondary | ICD-10-CM

## 2011-05-08 DIAGNOSIS — J309 Allergic rhinitis, unspecified: Secondary | ICD-10-CM

## 2011-05-08 DIAGNOSIS — M129 Arthropathy, unspecified: Secondary | ICD-10-CM

## 2011-05-08 DIAGNOSIS — E669 Obesity, unspecified: Secondary | ICD-10-CM

## 2011-05-08 DIAGNOSIS — E119 Type 2 diabetes mellitus without complications: Secondary | ICD-10-CM

## 2011-05-08 DIAGNOSIS — Z79899 Other long term (current) drug therapy: Secondary | ICD-10-CM

## 2011-05-08 DIAGNOSIS — I10 Essential (primary) hypertension: Secondary | ICD-10-CM

## 2011-05-08 DIAGNOSIS — J302 Other seasonal allergic rhinitis: Secondary | ICD-10-CM

## 2011-05-08 DIAGNOSIS — M109 Gout, unspecified: Secondary | ICD-10-CM

## 2011-05-08 NOTE — Progress Notes (Signed)
  Subjective:    Patient ID: Christopher Lambert, male    DOB: September 27, 1955, 55 y.o.   MRN: [id number]  HPI Increased left knee pain and instability, not ready to see orthopedics as yet. The PT is here for follow up and re-evaluation of chronic medical conditions, medication management and review of any available recent lab and radiology data.  Preventive health is updated, specifically  Cancer screening and Immunization.   Questions or concerns regarding consultations or procedures which the PT has had in the interim are  addressed. The PT denies any adverse reactions to current medications since the last visit.  There are no new concerns.  Pt has not been as diligent as in the past where exercise and diet are concerned due to increased home responsibilities, he intend s to be more diligent in the next several months, his diabetes is controled by diet only      Review of Systems Denies recent fever or chills. Denies sinus pressure, nasal congestion, ear pain or sore throat. Denies chest congestion, productive cough or wheezing. Denies chest pains, palpitations and leg swelling Denies abdominal pain, nausea, vomiting,diarrhea or constipation.   Denies dysuria, frequency, hesitancy or incontinence. Denies headaches, seizures, numbness, or tingling. Denies depression, anxiety or insomnia. Denies skin break down or rash.        Objective:   Physical Exam Patient alert and oriented and in no cardiopulmonary distress.  HEENT: No facial asymmetry, EOMI, no sinus tenderness,  oropharynx pink and moist.  Neck supple no adenopathy.  Chest: Clear to auscultation bilaterally.  CVS: S1, S2 no murmurs, no S3.  ABD: Soft non tender. Bowel sounds normal.  Ext: No edema  MS: Adequate ROM spine, shoulders, hips and reduced in left knee.  Skin: Intact, no ulcerations or rash noted.  Psych: Good eye contact, normal affect. Memory intact not anxious or depressed appearing.  CNS: CN 2-12 intact,  power, tone and sensation normal throughout.        Assessment & Plan:

## 2011-05-09 LAB — BASIC METABOLIC PANEL
CO2: 26 mEq/L (ref 19–32)
Calcium: 8.7 mg/dL (ref 8.4–10.5)
Chloride: 102 mEq/L (ref 96–112)
Creat: 0.93 mg/dL (ref 0.50–1.35)
Glucose, Bld: 112 mg/dL — ABNORMAL HIGH (ref 70–99)
Sodium: 138 mEq/L (ref 135–145)

## 2011-05-09 LAB — HEMOGLOBIN A1C: Mean Plasma Glucose: 137 mg/dL — ABNORMAL HIGH (ref <117)

## 2011-05-09 NOTE — Assessment & Plan Note (Signed)
Unchanged. Patient re-educated about  the importance of commitment to a  minimum of 150 minutes of exercise per week. The importance of healthy food choices with portion control discussed. Encouraged to start a food diary, count calories and to consider  joining a support group. Sample diet sheets offered. Goals set by the patient for the next several months.    

## 2011-05-09 NOTE — Assessment & Plan Note (Signed)
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

## 2011-05-09 NOTE — Assessment & Plan Note (Signed)
Controlled, no change in medication  

## 2011-05-09 NOTE — Assessment & Plan Note (Signed)
Will f/u on hBA1C , pt controls blood sugar wih diet only at this time

## 2011-05-09 NOTE — Patient Instructions (Signed)
F/U in mid December.  No med changes at this visit.  Your blood pressure is excellent.  Consider ortho evaluation of your left knee since it is unstable   A healthy diet is rich in fruit, vegetables and whole grains. Poultry fish, nuts and beans are a healthy choice for protein rather then red meat. A low sodium diet and drinking 64 ounces of water daily is generally recommended. Oils and sweet should be limited. Carbohydrates especially for those who are diabetic or overweight, should be limited to 34-45 gram per meal. It is important to eat on a regular schedule, at least 3 times daily. Snacks should be primarily fruits, vegetables or nuts.  Weight loss recommended too improve your health

## 2011-05-13 ENCOUNTER — Telehealth: Payer: Self-pay | Admitting: Family Medicine

## 2011-05-13 NOTE — Telephone Encounter (Signed)
Duplicate message, see result note

## 2011-05-14 NOTE — Telephone Encounter (Signed)
Patient aware of lab results.

## 2011-05-14 NOTE — Progress Notes (Signed)
Patient aware.

## 2011-08-05 ENCOUNTER — Encounter: Payer: BC Managed Care – PPO | Admitting: Family Medicine

## 2011-08-06 LAB — HEMOGLOBIN A1C: Hgb A1c MFr Bld: 6.2 % — ABNORMAL HIGH (ref <5.7)

## 2011-08-07 LAB — BASIC METABOLIC PANEL
BUN: 17 mg/dL (ref 6–23)
CO2: 28 mEq/L (ref 19–32)
Calcium: 9 mg/dL (ref 8.4–10.5)
Chloride: 100 mEq/L (ref 96–112)
Creat: 1.02 mg/dL (ref 0.50–1.35)

## 2011-08-07 LAB — LIPID PANEL: LDL Cholesterol: 108 mg/dL — ABNORMAL HIGH (ref 0–99)

## 2011-08-11 ENCOUNTER — Other Ambulatory Visit: Payer: Self-pay | Admitting: Family Medicine

## 2011-08-19 ENCOUNTER — Encounter: Payer: Self-pay | Admitting: Internal Medicine

## 2011-08-19 ENCOUNTER — Encounter: Payer: Self-pay | Admitting: Family Medicine

## 2011-08-19 ENCOUNTER — Other Ambulatory Visit: Payer: Self-pay | Admitting: Family Medicine

## 2011-08-19 ENCOUNTER — Ambulatory Visit (INDEPENDENT_AMBULATORY_CARE_PROVIDER_SITE_OTHER): Payer: BC Managed Care – PPO | Admitting: Internal Medicine

## 2011-08-19 VITALS — BP 138/78 | HR 89 | Ht 67.5 in | Wt 300.0 lb

## 2011-08-19 DIAGNOSIS — I4891 Unspecified atrial fibrillation: Secondary | ICD-10-CM

## 2011-08-19 DIAGNOSIS — G4730 Sleep apnea, unspecified: Secondary | ICD-10-CM

## 2011-08-19 DIAGNOSIS — G4710 Hypersomnia, unspecified: Secondary | ICD-10-CM

## 2011-08-19 NOTE — Progress Notes (Signed)
    Patient ID: Christopher Lambert, male    DOB: 23-Jul-1956, 55 y.o.   MRN: [id number]  HPI 02/13/11- OSA, rhinitis Last here August 20, 2010. Note reviewed. Continues CPAP 16, all night every night, all night. Still occasional yawn and nod. Tried Provigil for residual sleepiness but it caused tachy palpitation. He has not been a coffee drinker.  Mild nasal congestion early Spring, managed otc, but ok now.   08/19/11- 55 yoM never smoker followed for OSA, rhinitis, PAF PCP Dr Syliva Overman Has had flu vaccine. 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 Continues CPAP at 16 CWP with good compliance and control. Still experiences daytime sleepiness if quiet and will get himself caffeine to help. Has had intervals of sustained tachycardia palpitation at least twice. He describes a sudden onset without pain or stress, gradually easing off. He carries a diagnosis of atrial fib on his problem list.  Review of Systems Constitutional:   No-   weight loss, night sweats, fevers, chills, fatigue, +lassitude. HEENT:   No-  headaches, difficulty swallowing, tooth/dental problems, sore throat,       No-  sneezing, itching, ear ache, nasal congestion, post nasal drip,  CV:  No-   chest pain, orthopnea, PND, swelling in lower extremities, anasarca,                                  dizziness, +palpitations Resp: No-   shortness of breath with exertion or at rest.              No-   productive cough,  No non-productive cough,  No- coughing up of blood.              No-   change in color of mucus.  No- wheezing.   Skin: No-   rash or lesions.  Objective:   Physical Exam General- Alert, Oriented, Affect-appropriate, Distress- none acute, obese Skin- rash-none, lesions- none, excoriation- none Lymphadenopathy- none Head- atraumatic            Eyes- Gross vision intact, PERRLA, conjunctivae clear secretions            Ears- Hearing, canals-normal            Nose- Clear, no-Septal dev, mucus, polyps, erosion,  perforation             Throat- Mallampati IV , mucosa clear , drainage- none, tonsils- atrophic Neck- flexible , trachea midline, no stridor , thyroid nl, carotid no bruit Chest - symmetrical excursion , unlabored           Heart/CV- RRR , no murmur , no gallop  , no rub, nl s1 s2                           - JVD- none , edema- none, stasis changes- none, varices- none           Lung- clear to P&A, wheeze- none, cough- none , dullness-none, rub- none           Chest wall-  Abd- tender-no, distended-no, bowel sounds-present, HSM- no Br/ Gen/ Rectal- Not done, not indicated Extrem- cyanosis- none, clubbing, none, atrophy- none, strength- nl Neuro- grossly intact to observation

## 2011-08-19 NOTE — Patient Instructions (Addendum)
Continue CPAP 16  Weight loss would help [place] manage the sleep apnea better.  Before we would consider using any kind of stimulant to help with daytime sleepiness, I need you to discuss the spells of rapid heart beat with Dr Lodema Hong next week when you see her.

## 2011-08-22 NOTE — Assessment & Plan Note (Signed)
Describes tachycardia palpitation with abrupt onset and slow spontaneous resolution. I've asked him to discuss this with Dr. Lodema Hong.

## 2011-08-22 NOTE — Assessment & Plan Note (Signed)
He may have a separate primary sleep disorder. Because of his atrial fibrillation/tachycardia palpitations history, I don't want to increase stimulants. I have asked him to report this issue to Dr. Lodema Hong when he sees her as scheduled next week.

## 2011-08-22 NOTE — Assessment & Plan Note (Signed)
CPAP 16 with good compliance and control but with residual hypersomnia.

## 2011-08-24 ENCOUNTER — Ambulatory Visit (INDEPENDENT_AMBULATORY_CARE_PROVIDER_SITE_OTHER): Payer: BC Managed Care – PPO | Admitting: Family Medicine

## 2011-08-24 ENCOUNTER — Encounter: Payer: Self-pay | Admitting: Family Medicine

## 2011-08-24 VITALS — BP 122/84 | HR 62 | Resp 16 | Ht 67.5 in | Wt 298.4 lb

## 2011-08-24 DIAGNOSIS — I10 Essential (primary) hypertension: Secondary | ICD-10-CM

## 2011-08-24 DIAGNOSIS — E669 Obesity, unspecified: Secondary | ICD-10-CM

## 2011-08-24 DIAGNOSIS — R7309 Other abnormal glucose: Secondary | ICD-10-CM

## 2011-08-24 DIAGNOSIS — Z0000 Encounter for general adult medical examination without abnormal findings: Secondary | ICD-10-CM

## 2011-08-24 DIAGNOSIS — M129 Arthropathy, unspecified: Secondary | ICD-10-CM

## 2011-08-24 DIAGNOSIS — B351 Tinea unguium: Secondary | ICD-10-CM | POA: Insufficient documentation

## 2011-08-24 DIAGNOSIS — R002 Palpitations: Secondary | ICD-10-CM

## 2011-08-24 DIAGNOSIS — Z23 Encounter for immunization: Secondary | ICD-10-CM

## 2011-08-24 HISTORY — DX: Palpitations: R00.2

## 2011-08-24 MED ORDER — TERBINAFINE HCL 250 MG PO TABS: 250.0000 mg | ORAL_TABLET | Freq: Every day | ORAL | Status: AC

## 2011-08-24 NOTE — Progress Notes (Signed)
  Subjective:    Patient ID: Christopher Lambert, male    DOB: 1956/01/04, 55 y.o.   MRN: [id number]  HPI 3 weeks ago pt had a single episode of palpitations, no near syncope, no chest pain , nausea, diaphoresis. This happened for 3 to 4 days consecutively, denies symptoms of anxiety , panic or increased caffeine intake.Was diabetic , now controlled on diet only and his brother died of cAD in late 69's Increased pain and instability of left knee noted, knows he needs surgery but still putting this off. No fall    Review of Systems See HPI Denies recent fever or chills. Denies sinus pressure, nasal congestion, ear pain or sore throat. Denies chest congestion, productive cough or wheezing.  Denies abdominal pain, nausea, vomiting,diarrhea or constipation.   Denies dysuria, frequency, hesitancy or incontinence. Denies headaches, seizures, numbness, or tingling. Denies depression, anxiety or insomnia. Denies skin break down or rash.        Objective:   Physical Exam Patient alert and oriented and in no cardiopulmonary distress.  HEENT: No facial asymmetry, EOMI, no sinus tenderness,  oropharynx pink and moist.  Neck supple no adenopathy.  Chest: Clear to auscultation bilaterally.  CVS: S1, S2 no murmurs, no S3.  ABD: Soft non tender. Bowel sounds normal.  Ext: No edema  MS: Adequate ROM spine, shoulders, hips and decreased in left  Knee which is also slightly swollen  Skin: Intact, no ulcerations or rash noted.  Psych: Good eye contact, normal affect. Memory intact not anxious or depressed appearing.  CNS: CN 2-12 intact, power, tone and sensation normal throughout.        Assessment & Plan:

## 2011-08-24 NOTE — Patient Instructions (Signed)
F/u in 4.5 months.  You are being referred to cardiology for further evaluation of new onset palpitations. If symptoms recur, you need to go to the ED  Med is sent in for fungal nail infection, lab needed to check your liver and I will rept tSH due new palpitations  Pls reduce caloric intake to facilitate weight loss,  PSA, HBA!C and chem 7 in 4.5 months just before return also hepatic panel

## 2011-08-24 NOTE — Assessment & Plan Note (Signed)
Check hepatic panel, aln 90 day of lamisil, improved somewhat but still present

## 2011-08-24 NOTE — Assessment & Plan Note (Signed)
New onset palpitations, will do EKG and refer to cardiology due to multiple cardiac risk factors and the need to further eval

## 2011-08-25 ENCOUNTER — Ambulatory Visit (INDEPENDENT_AMBULATORY_CARE_PROVIDER_SITE_OTHER): Payer: BC Managed Care – PPO | Admitting: Cardiology

## 2011-08-25 ENCOUNTER — Encounter: Payer: Self-pay | Admitting: Cardiology

## 2011-08-25 ENCOUNTER — Encounter: Payer: Self-pay | Admitting: *Deleted

## 2011-08-25 VITALS — BP 125/80 | HR 60 | Ht 67.0 in | Wt 297.0 lb

## 2011-08-25 DIAGNOSIS — I10 Essential (primary) hypertension: Secondary | ICD-10-CM

## 2011-08-25 DIAGNOSIS — R0602 Shortness of breath: Secondary | ICD-10-CM

## 2011-08-25 DIAGNOSIS — R002 Palpitations: Secondary | ICD-10-CM

## 2011-08-25 DIAGNOSIS — G4730 Sleep apnea, unspecified: Secondary | ICD-10-CM

## 2011-08-25 LAB — HEPATIC FUNCTION PANEL
ALT: 17 U/L (ref 0–53)
AST: 14 U/L (ref 0–37)
Bilirubin, Direct: 0.2 mg/dL (ref 0.0–0.3)
Total Protein: 6.7 g/dL (ref 6.0–8.3)

## 2011-08-25 NOTE — Assessment & Plan Note (Signed)
Have now resolved, reported episode approximately 3 weeks ago. Baseline ECG shows sinus bradycardia. We discussed the possibility of a transient arrhythmia such as atrial fibrillation, particularly with his history of sleep apnea and hypertension. For now would recommend observation. If he has recurrent symptoms with any regularity, then we can choose an appropriate time course for further outpatient monitoring.

## 2011-08-25 NOTE — Assessment & Plan Note (Signed)
Blood pressure is well-controlled today. 

## 2011-08-25 NOTE — Assessment & Plan Note (Signed)
Described with exertion, particularly when walking up stairs, progressive over the last 6 months. Could be multifactorial in setting of morbid obesity, however additional cardiac risk factors include age and gender, hypertension, also family history of premature cardiovascular disease in a brother. At his baseline ECG shows sinus bradycardia with no acute ST segment changes. He would have difficulty ambulating on a treadmill related to his arthritic knee problems and previous right knee replacement. Plan will be further ischemic evaluation with a 2 day protocol Lexiscan Myoview. I will bring him back to the office to discuss the results.

## 2011-08-25 NOTE — Assessment & Plan Note (Signed)
Patient reports compliance with CPAP 

## 2011-08-25 NOTE — Progress Notes (Signed)
Clinical Summary [name] is a 55 y.o.male referred for cardiology consultation by Dr. Lodema Hong. He reports two separate symptom concerns. Firstly, he mentions an episode of palpitations that he experienced approximately 3 weeks ago. He states he felt like his heart suddenly "sped up" around the time that he prepares to go to bed. This happened for a few evenings in a row, and then completely resolved. Symptoms lasted for a few minutes at a time. There was no associated dizziness or syncope. No chest pain. Secondly, he also has noticed somewhat progressive dyspnea on exertion with activities such as climbing stairs, over the last 6 months. He denies any exertional chest pain, or exertional palpitations.  He reports faithful compliance with CPAP therapy and also his medications. Denies any orthopnea or PND. He does have chronic lower extremity edema, right worse than left, status post previous right knee replacement.   No Known Allergies  Medication list reviewed.  Past Medical History  Diagnosis Date  . Seasonal allergies   . Essential hypertension, benign   . Sleep apnea   . Obesity   . Arthritis of knee     Bilateral  . ED (erectile dysfunction)     Past Surgical History  Procedure Date  . Carpal tunnel release     Right   . Umbilical hernia repair   . Right knee replacement 06/2010    Family History  Problem Relation Age of Onset  . Coronary artery disease Brother     Premature    Social History Mr. Swoboda reports that he has never smoked. He has never used smokeless tobacco. Mr. Faller reports that he does not drink alcohol.  Review of Systems Has chronic arthritic knee pain. Negative except as outlined above.  Physical Examination Filed Vitals:   08/25/11 0834  BP: 125/80  Pulse: 60   Morbidly obese male in no acute distress. HEENT: Conjunctiva and lids normal, oropharynx clear. Neck: Supple, no elevated JVP or carotid bruits, no thyromegaly. Lungs: Diminished but  clear breath sounds, nonlabored. Cardiac: Regular rate and rhythm, no S3, no rub. Abdomen: Obese, nontender, bowel sounds present. Skin: Warm and dry. Extremities: Chronic appearing 2+ edema below the knees, right worse than left. Distal pulses one plus. Musculoskeletal: No kyphosis. Neuropsychiatric: Alert and oriented x3, affect appropriate.  ECG Sinus bradycardia   Problem List and Plan

## 2011-08-25 NOTE — Patient Instructions (Signed)
Your physician recommends that you schedule a follow-up appointment in: 3-4 weeks with Dr Diona Browner  Your physician has requested that you have a lexiscan myoview. For further information please visit [email or web address] Please follow instruction sheet, as given.

## 2011-08-26 NOTE — Assessment & Plan Note (Signed)
unchanged Patient re-educated about  the importance of commitment to a  minimum of 150 minutes of exercise per week. The importance of healthy food choices with portion control discussed. Encouraged to start a food diary, count calories and to consider  joining a support group. Sample diet sheets offered. Goals set by the patient for the next several months.    

## 2011-08-26 NOTE — Assessment & Plan Note (Signed)
Worsening pain in left knee will wait until next Summer for surgery per pt preference, no h/o falls

## 2011-08-26 NOTE — Assessment & Plan Note (Signed)
Controlled, no change in medication  

## 2011-08-26 NOTE — Assessment & Plan Note (Signed)
Controlled with dietary change and weight loss

## 2011-08-31 ENCOUNTER — Encounter: Payer: Self-pay | Admitting: Family Medicine

## 2011-09-02 ENCOUNTER — Encounter (HOSPITAL_COMMUNITY)
Admission: RE | Admit: 2011-09-02 | Discharge: 2011-09-02 | Disposition: A | Discharge status: Home / Self Care | Payer: BC Managed Care – PPO | Source: Ambulatory Visit | Attending: Cardiology | Admitting: Cardiology

## 2011-09-02 ENCOUNTER — Ambulatory Visit (INDEPENDENT_AMBULATORY_CARE_PROVIDER_SITE_OTHER): Payer: BC Managed Care – PPO | Admitting: *Deleted

## 2011-09-02 ENCOUNTER — Encounter (HOSPITAL_COMMUNITY): Payer: Self-pay

## 2011-09-02 DIAGNOSIS — R0602 Shortness of breath: Secondary | ICD-10-CM

## 2011-09-02 DIAGNOSIS — R0609 Other forms of dyspnea: Secondary | ICD-10-CM | POA: Insufficient documentation

## 2011-09-02 DIAGNOSIS — R0989 Other specified symptoms and signs involving the circulatory and respiratory systems: Secondary | ICD-10-CM | POA: Insufficient documentation

## 2011-09-02 DIAGNOSIS — R002 Palpitations: Secondary | ICD-10-CM | POA: Insufficient documentation

## 2011-09-02 MED ORDER — TECHNETIUM TC 99M TETROFOSMIN IV KIT: 30.0000 | PACK | Freq: Once | INTRAVENOUS | Status: DC | PRN

## 2011-09-02 NOTE — Progress Notes (Signed)
Stress Lab Nurses Notes - Christopher Lambert  Christopher Lambert 09/02/2011  Reason for doing test: Dyspnea  Type of test: Steffanie Dunn  Nurse performing test: Marlena Clipper, RN  Nuclear Medicine Tech: Lou Cal  Echo Tech: Not Applicable  MD performing test: Wende Bushy PA  Family MD:   Test explained and consent signed: yes  IV started: 22g jelco  Symptoms: Slight SOB   Treatment/Intervention: None  Reason test stopped: protocol completed  After recovery IV was: Discontinued via X-ray tech and Saline Lock flushed  Patient to return to Nuc. Med at :10:45  Patient discharged: Home  Patient's Condition upon discharge was: stable   Comments: Patient's Resting HR was 57 and resting BP was 118/60. His peak HR was 80 and Peak BP was 120/70. His symptoms from Lexiscan was slight SOB  Jaci Lazier, Lelon Huh

## 2011-09-03 ENCOUNTER — Encounter (HOSPITAL_COMMUNITY)
Admission: RE | Admit: 2011-09-03 | Discharge: 2011-09-03 | Disposition: A | Discharge status: Home / Self Care | Payer: BC Managed Care – PPO | Source: Ambulatory Visit | Attending: Cardiology | Admitting: Cardiology

## 2011-09-03 MED ORDER — TECHNETIUM TC 99M TETROFOSMIN IV KIT
30.0000 | PACK | Freq: Once | INTRAVENOUS | Status: AC | PRN
  Administered 2011-09-03: 25 via INTRAVENOUS

## 2011-09-15 HISTORY — PX: JOINT REPLACEMENT: SHX530

## 2011-09-21 ENCOUNTER — Ambulatory Visit (HOSPITAL_COMMUNITY)
Admission: RE | Admit: 2011-09-21 | Discharge: 2011-09-21 | Disposition: A | Discharge status: Home / Self Care | Payer: BC Managed Care – PPO | Source: Ambulatory Visit | Attending: Cardiology | Admitting: Cardiology

## 2011-09-21 ENCOUNTER — Encounter: Payer: Self-pay | Admitting: Cardiology

## 2011-09-21 ENCOUNTER — Other Ambulatory Visit: Payer: Self-pay | Admitting: Cardiology

## 2011-09-21 ENCOUNTER — Ambulatory Visit (INDEPENDENT_AMBULATORY_CARE_PROVIDER_SITE_OTHER): Payer: BC Managed Care – PPO | Admitting: Cardiology

## 2011-09-21 VITALS — BP 137/82 | HR 65 | Resp 18 | Ht 67.0 in | Wt 296.0 lb

## 2011-09-21 DIAGNOSIS — Z01818 Encounter for other preprocedural examination: Secondary | ICD-10-CM | POA: Insufficient documentation

## 2011-09-21 DIAGNOSIS — I10 Essential (primary) hypertension: Secondary | ICD-10-CM

## 2011-09-21 DIAGNOSIS — R9430 Abnormal result of cardiovascular function study, unspecified: Secondary | ICD-10-CM

## 2011-09-21 DIAGNOSIS — R002 Palpitations: Secondary | ICD-10-CM

## 2011-09-21 DIAGNOSIS — R0602 Shortness of breath: Secondary | ICD-10-CM

## 2011-09-21 HISTORY — DX: Abnormal result of cardiovascular function study, unspecified: R94.30

## 2011-09-21 LAB — BASIC METABOLIC PANEL
Calcium: 9 mg/dL (ref 8.4–10.5)
Creat: 1.04 mg/dL (ref 0.50–1.35)

## 2011-09-21 LAB — CBC WITH DIFFERENTIAL/PLATELET
Basophils Absolute: 0 10*3/uL (ref 0.0–0.1)
Eosinophils Absolute: 0.1 10*3/uL (ref 0.0–0.7)
Eosinophils Relative: 4 % (ref 0–5)
Lymphocytes Relative: 35 % (ref 12–46)
MCH: 30.3 pg (ref 26.0–34.0)
MCV: 89.1 fL (ref 78.0–100.0)
Platelets: 200 10*3/uL (ref 150–400)
RDW: 13.4 % (ref 11.5–15.5)
WBC: 4 10*3/uL (ref 4.0–10.5)

## 2011-09-21 LAB — PROTIME-INR: Prothrombin Time: 13.8 seconds (ref 11.6–15.2)

## 2011-09-21 NOTE — Progress Notes (Signed)
Clinical Summary [name] is a 56 y.o.male presenting for followup. He was seen in December with history of dyspnea on exertion as well as transient palpitations. He was referred for a 2-day Myoview study, interpreted by Dr. Dietrich Lambert to reveal evidence of anteroseptal ischemia, scarring or attenuation affecting the basal inferior wall, and an LVEF of 47%. We discussed these results today.  He denies any further palpitations. Still has dyspnea on exertion, mainly noticed at work when he has to climb up steps or do heavy lifting. No frank chest pain. Possibility of underlying obstructive CAD is certainly to be considered in light of his Myoview, although he is morbidly obese which may also be playing a role.  We discussed either continuing observation on medical therapy for presumptive CAD, versus continuing on to a cardiac catheterization to clearly define the coronary anatomy and assess her revascularization options. He spoke with his wife, and is in agreement to proceed after reviewing the risks and benefits with me.   No Known Allergies  Current Outpatient Prescriptions  Medication Sig Dispense Refill  . allopurinol (ZYLOPRIM) 300 MG tablet TAKE 1 TABLET BY MOUTH EVERY DAY  30 tablet  2  . benazepril-hydrochlorthiazide (LOTENSIN HCT) 20-25 MG per tablet TAKE 1 TABLET BY MOUTH EVERY DAY  30 tablet  2  . cholecalciferol (VITAMIN D) 1000 UNITS tablet Take 1,000 Units by mouth daily.        . clindamycin (CLEOCIN) 150 MG capsule Take 150 mg by mouth. Take 4 capsules by mouth 1 hour prior to procedure       . doxycycline (PERIOSTAT) 20 MG tablet 20 mg 2 (two) times daily.       . fluticasone (FLONASE) 50 MCG/ACT nasal spray Place 2 sprays into the nose daily.  16 g  5  . meloxicam (MOBIC) 15 MG tablet TAKE 1 TABLET BY MOUTH EVERY DAY  30 tablet  2  . NIFEdipine (PROCARDIA XL/ADALAT-CC) 90 MG 24 hr tablet TAKE 1 TABLET BY MOUTH EVERY DAY  30 tablet  2  . terbinafine (LAMISIL) 250 MG tablet Take 1  tablet (250 mg total) by mouth daily.  90 tablet  0    Past Medical History  Diagnosis Date  . Seasonal allergies   . Essential hypertension, benign   . Sleep apnea   . Obesity   . Arthritis of knee     Bilateral  . ED (erectile dysfunction)     Past Surgical History  Procedure Date  . Carpal tunnel release     Right   . Umbilical hernia repair   . Right knee replacement 06/2010    Family History  Problem Relation Age of Onset  . Coronary artery disease Brother     Premature    Social History [name] reports that he has never smoked. He has never used smokeless tobacco. [name] reports that he does not drink alcohol.  Review of Systems No syncope. No reported bleeding problems. No fevers or chills. Otherwise negative except as outlined.  Physical Examination Filed Vitals:   09/21/11 0839  BP: 137/82  Pulse: 65  Resp: 18   Morbidly obese male in no acute distress.  HEENT: Conjunctiva and lids normal, oropharynx clear.  Neck: Supple, no elevated JVP or carotid bruits, no thyromegaly.  Lungs: Diminished but clear breath sounds, nonlabored.  Cardiac: Regular rate and rhythm, no S3, no rub.  Abdomen: Obese, nontender, bowel sounds present.  Skin: Warm and dry.  Extremities: Chronic appearing 2+ edema  below the knees, right worse than left. Distal pulses one plus.  Musculoskeletal: No kyphosis.    Problem List and Plan

## 2011-09-21 NOTE — Assessment & Plan Note (Signed)
For now, continue present regimen.

## 2011-09-21 NOTE — Assessment & Plan Note (Signed)
Plan to exclude obstructive CAD as etiology, however certainly morbid obesity may be playing a role.

## 2011-09-21 NOTE — Patient Instructions (Signed)
Your physician has requested that you have a cardiac catheterization. Cardiac catheterization is used to diagnose and/or treat various heart conditions. Doctors may recommend this procedure for a number of different reasons. The most common reason is to evaluate chest pain. Chest pain can be a symptom of coronary artery disease (CAD), and cardiac catheterization can show whether plaque is narrowing or blocking your heart's arteries. This procedure is also used to evaluate the valves, as well as measure the blood flow and oxygen levels in different parts of your heart. For further information please visit [email or web address]. Please follow instruction sheet, as given.  Your physician recommends that you return for lab work in: today  A chest x-ray takes a picture of the organs and structures inside the chest, including the heart, lungs, and blood vessels. This test can show several things, including, whether the heart is enlarges; whether fluid is building up in the lungs; and whether pacemaker / defibrillator leads are still in place.  Your physician recommends that you schedule a follow-up appointment in: after cath   

## 2011-09-21 NOTE — Assessment & Plan Note (Signed)
As yet not recurrent.

## 2011-09-21 NOTE — Assessment & Plan Note (Signed)
We discussed the results of the recent Myoview, possibility of underlying obstructive CAD with evidence of anteroseptal ischemia and possible basal inferior scar. As discussed above, plan is to pursue a diagnostic cardiac catheterization to clearly define the coronary anatomy and assess revascularization options. This will be arranged later this week as an outpatient. Risks and benefits were discussed.

## 2011-09-21 NOTE — Assessment & Plan Note (Signed)
We did discuss weight loss.

## 2011-09-25 ENCOUNTER — Inpatient Hospital Stay (HOSPITAL_BASED_OUTPATIENT_CLINIC_OR_DEPARTMENT_OTHER)
Admission: RE | Admit: 2011-09-25 | Discharge: 2011-09-25 | Disposition: A | Discharge status: Home / Self Care | Payer: BC Managed Care – PPO | Source: Ambulatory Visit | Attending: Cardiovascular Disease | Admitting: Cardiovascular Disease

## 2011-09-25 ENCOUNTER — Encounter (HOSPITAL_BASED_OUTPATIENT_CLINIC_OR_DEPARTMENT_OTHER)
Admission: RE | Disposition: A | Discharge status: Home / Self Care | Payer: Self-pay | Source: Ambulatory Visit | Attending: Cardiovascular Disease

## 2011-09-25 DIAGNOSIS — R0609 Other forms of dyspnea: Secondary | ICD-10-CM | POA: Insufficient documentation

## 2011-09-25 DIAGNOSIS — R0602 Shortness of breath: Secondary | ICD-10-CM

## 2011-09-25 DIAGNOSIS — R0989 Other specified symptoms and signs involving the circulatory and respiratory systems: Secondary | ICD-10-CM | POA: Insufficient documentation

## 2011-09-25 DIAGNOSIS — R9430 Abnormal result of cardiovascular function study, unspecified: Secondary | ICD-10-CM

## 2011-09-25 HISTORY — PX: CARDIAC CATHETERIZATION: SHX172

## 2011-09-25 SURGERY — JV LEFT HEART CATHETERIZATION WITH CORONARY ANGIOGRAM
Anesthesia: Moderate Sedation

## 2011-09-25 MED ORDER — DIAZEPAM 5 MG PO TABS
5.0000 mg | ORAL_TABLET | ORAL | Status: AC
  Administered 2011-09-25: 5 mg via ORAL

## 2011-09-25 MED ORDER — ASPIRIN 81 MG PO CHEW
324.0000 mg | CHEWABLE_TABLET | ORAL | Status: AC
  Administered 2011-09-25: 324 mg via ORAL

## 2011-09-25 MED ORDER — SODIUM CHLORIDE 0.9 % IV SOLN
INTRAVENOUS | Status: DC
  Administered 2011-09-25: 09:00:00 via INTRAVENOUS

## 2011-09-25 NOTE — Brief Op Note (Signed)
    Cardiac Cath Note  KYAL ARTS [id number] 07/28/1956  Procedure: Left Heart Cardiac Catheterization Note Indications: mildly reduced LV systolic function. dyspnea  Procedure Details Consent: Obtained Time Out: Verified patient identification, verified procedure, site/side was marked, verified correct patient position, special equipment/implants available, Radiology Safety Procedures followed,  medications/allergies/relevent history reviewed, required imaging and test results available.  Performed  Medications: Versed 2 mg IV Fentanyl 25 mcg IV  The right femoral artery was easily canulated using a modified Seldinger technique.  Hemodynamics:   LV pressure: 118/19 Aortic pressure: 118/69  Angiography   Left Main: The left main is large and normal.  Left anterior Descending: The left anterior descending artery is large. It is smooth and normal throughout the course. The LAD gives off a moderate-sized diagonal branch which is normal.  Left Circumflex: The left circumflex artery is a large system is codominant. He gives off a large first obtuse marginal artery which is normal. The posterior descending artery and posterior lateral segment artery are normal.  Right Coronary Artery: Right coronary artery is moderate in size and is codominant. It is smooth and normal throughout its course. The posterior descending artery and in the posterior lateral segment artery are normal.  LV Gram: The left ventriculogram was performed in the 30 RAO position. It reveals normal left ventricular systolic function. Ejection fraction is around 65% to  Complications: No apparent complications Patient did tolerate procedure well.  Conclusions:    1. Smooth and normal coronary arteries 2. Normal left systolic function  Contrast used: 70 cc.   Vesta Mixer, Montez Hageman., MD, [facility] 09/25/2011, 11:38 AM

## 2011-09-25 NOTE — Progress Notes (Signed)
Discharge instructions completed.  Ambulated to bathroom without bleeding from right groin site.  IV d/c'd.  Discharged to home via wheelchair with wife.

## 2011-09-25 NOTE — H&P (View-Only) (Signed)
 Clinical Summary Mr. Barillas is a 56 y.o.male presenting for followup. He was seen in December with history of dyspnea on exertion as well as transient palpitations. He was referred for a 2-day Myoview study, interpreted by Dr. Rothbart to reveal evidence of anteroseptal ischemia, scarring or attenuation affecting the basal inferior wall, and an LVEF of 47%. We discussed these results today.  He denies any further palpitations. Still has dyspnea on exertion, mainly noticed at work when he has to climb up steps or do heavy lifting. No frank chest pain. Possibility of underlying obstructive CAD is certainly to be considered in light of his Myoview, although he is morbidly obese which may also be playing a role.  We discussed either continuing observation on medical therapy for presumptive CAD, versus continuing on to a cardiac catheterization to clearly define the coronary anatomy and assess her revascularization options. He spoke with his wife, and is in agreement to proceed after reviewing the risks and benefits with me.   No Known Allergies  Current Outpatient Prescriptions  Medication Sig Dispense Refill  . allopurinol (ZYLOPRIM) 300 MG tablet TAKE 1 TABLET BY MOUTH EVERY DAY  30 tablet  2  . benazepril-hydrochlorthiazide (LOTENSIN HCT) 20-25 MG per tablet TAKE 1 TABLET BY MOUTH EVERY DAY  30 tablet  2  . cholecalciferol (VITAMIN D) 1000 UNITS tablet Take 1,000 Units by mouth daily.        . clindamycin (CLEOCIN) 150 MG capsule Take 150 mg by mouth. Take 4 capsules by mouth 1 hour prior to procedure       . doxycycline (PERIOSTAT) 20 MG tablet 20 mg 2 (two) times daily.       . fluticasone (FLONASE) 50 MCG/ACT nasal spray Place 2 sprays into the nose daily.  16 g  5  . meloxicam (MOBIC) 15 MG tablet TAKE 1 TABLET BY MOUTH EVERY DAY  30 tablet  2  . NIFEdipine (PROCARDIA XL/ADALAT-CC) 90 MG 24 hr tablet TAKE 1 TABLET BY MOUTH EVERY DAY  30 tablet  2  . terbinafine (LAMISIL) 250 MG tablet Take 1  tablet (250 mg total) by mouth daily.  90 tablet  0    Past Medical History  Diagnosis Date  . Seasonal allergies   . Essential hypertension, benign   . Sleep apnea   . Obesity   . Arthritis of knee     Bilateral  . ED (erectile dysfunction)     Past Surgical History  Procedure Date  . Carpal tunnel release     Right   . Umbilical hernia repair   . Right knee replacement 06/2010    Family History  Problem Relation Age of Onset  . Coronary artery disease Brother     Premature    Social History Mr. Fuertes reports that he has never smoked. He has never used smokeless tobacco. Mr. Highley reports that he does not drink alcohol.  Review of Systems No syncope. No reported bleeding problems. No fevers or chills. Otherwise negative except as outlined.  Physical Examination Filed Vitals:   09/21/11 0839  BP: 137/82  Pulse: 65  Resp: 18   Morbidly obese male in no acute distress.  HEENT: Conjunctiva and lids normal, oropharynx clear.  Neck: Supple, no elevated JVP or carotid bruits, no thyromegaly.  Lungs: Diminished but clear breath sounds, nonlabored.  Cardiac: Regular rate and rhythm, no S3, no rub.  Abdomen: Obese, nontender, bowel sounds present.  Skin: Warm and dry.  Extremities: Chronic appearing 2+ edema   below the knees, right worse than left. Distal pulses one plus.  Musculoskeletal: No kyphosis.    Problem List and Plan   

## 2011-09-25 NOTE — Interval H&P Note (Signed)
History and Physical Interval Note:  09/25/2011 11:37 AM  Christopher Lambert  has presented today for surgery, with the diagnosis of cp  The various methods of treatment have been discussed with the patient and family. After consideration of risks, benefits and other options for treatment, the patient has consented to  Procedure(s): JV LEFT HEART CATHETERIZATION WITH CORONARY ANGIOGRAM as a surgical intervention .  The patients' history has been reviewed, patient examined, no change in status, stable for surgery.  I have reviewed the patients' chart and labs.  Questions were answered to the patient's satisfaction.     Elyn Aquas.

## 2011-09-25 NOTE — OR Nursing (Signed)
Meal served 

## 2011-09-25 NOTE — OR Nursing (Signed)
Dr Nahser at bedside to discuss results and treatment plan with pt and family 

## 2011-09-25 NOTE — Op Note (Signed)
    Cardiac Cath Note  Christopher Lambert [id number] 10/14/1955  Procedure: Left Heart Cardiac Catheterization Note Indications: mildly reduced LV systolic function. dyspnea  Procedure Details Consent: Obtained Time Out: Verified patient identification, verified procedure, site/side was marked, verified correct patient position, special equipment/implants available, Radiology Safety Procedures followed,  medications/allergies/relevent history reviewed, required imaging and test results available.  Performed  Medications: Versed 2 mg IV Fentanyl 25 mcg IV  The right femoral artery was easily canulated using a modified Seldinger technique.  Hemodynamics:   LV pressure: 118/19 Aortic pressure: 118/69  Angiography   Left Main: The left main is large and normal.  Left anterior Descending: The left anterior descending artery is large. It is smooth and normal throughout the course. The LAD gives off a moderate-sized diagonal branch which is normal.  Left Circumflex: The left circumflex artery is a large system is codominant. He gives off a large first obtuse marginal artery which is normal. The posterior descending artery and posterior lateral segment artery are normal.  Right Coronary Artery: Right coronary artery is moderate in size and is codominant. It is smooth and normal throughout its course. The posterior descending artery and in the posterior lateral segment artery are normal.  LV Gram: The left ventriculogram was performed in the 30 RAO position. It reveals normal left ventricular systolic function. Ejection fraction is around 65% to  Complications: No apparent complications Patient did tolerate procedure well.  Conclusions:    1. Smooth and normal coronary arteries 2. Normal left systolic function  Contrast used: 70 cc.   Philip J. Nahser, Jr., MD, FACC 09/25/2011, 11:38 AM     

## 2011-10-09 ENCOUNTER — Ambulatory Visit (INDEPENDENT_AMBULATORY_CARE_PROVIDER_SITE_OTHER): Payer: BC Managed Care – PPO | Admitting: Adult Health

## 2011-10-09 ENCOUNTER — Encounter: Payer: Self-pay | Admitting: Adult Health

## 2011-10-09 DIAGNOSIS — I10 Essential (primary) hypertension: Secondary | ICD-10-CM

## 2011-10-09 DIAGNOSIS — R9430 Abnormal result of cardiovascular function study, unspecified: Secondary | ICD-10-CM

## 2011-10-09 NOTE — Patient Instructions (Signed)
Your physician recommends that you continue on your current medications as directed. Please refer to the Current Medication list given to you today.  Your physician recommends that you schedule a follow-up appointment in: as needed  

## 2011-10-12 ENCOUNTER — Encounter: Payer: Self-pay | Admitting: Adult Health

## 2011-10-12 NOTE — Assessment & Plan Note (Signed)
He is doing better with symptoms. BP is moderately controlled. Recheck in the office exam room correlates with is triage check in BP. He is to continue his medications. Wt loss and increased exercise is encourage. No further cardiac work-up or testing is planned. Will see on PRN basis.

## 2011-10-12 NOTE — Progress Notes (Signed)
   HPI: Christopher Lambert is a 56 y/o patient of Dr. Dietrich Pates we are seeing on hospital follow-up after admission for shortness of breath and chest pressure. He has cardiac cath revealing normal coronary arteries and normal LV fx. He has a history of hypertension and OSA. He comes without complaint today. No recurrent chest pain. He has lost weight and plans on losing about 50 lbs more. He wants to get better about exercise.   No Known Allergies  Current Outpatient Prescriptions  Medication Sig Dispense Refill  . allopurinol (ZYLOPRIM) 300 MG tablet TAKE 1 TABLET BY MOUTH EVERY DAY  30 tablet  2  . benazepril-hydrochlorthiazide (LOTENSIN HCT) 20-25 MG per tablet TAKE 1 TABLET BY MOUTH EVERY DAY  30 tablet  2  . cholecalciferol (VITAMIN D) 1000 UNITS tablet Take 1,000 Units by mouth daily.        . clindamycin (CLEOCIN) 150 MG capsule Take 150 mg by mouth. Take 4 capsules by mouth 1 hour prior to procedure       . doxycycline (PERIOSTAT) 20 MG tablet 20 mg 2 (two) times daily.       . fluticasone (FLONASE) 50 MCG/ACT nasal spray Place 2 sprays into the nose daily.  16 g  5  . meloxicam (MOBIC) 15 MG tablet TAKE 1 TABLET BY MOUTH EVERY DAY  30 tablet  2  . NIFEdipine (PROCARDIA XL/ADALAT-CC) 90 MG 24 hr tablet TAKE 1 TABLET BY MOUTH EVERY DAY  30 tablet  2  . terbinafine (LAMISIL) 250 MG tablet Take 1 tablet (250 mg total) by mouth daily.  90 tablet  0    Past Medical History  Diagnosis Date  . Seasonal allergies   . Essential hypertension, benign   . Sleep apnea   . Obesity   . Arthritis of knee     Bilateral  . ED (erectile dysfunction)     Past Surgical History  Procedure Date  . Carpal tunnel release     Right   . Umbilical hernia repair   . Right knee replacement 06/2010    RUE:AVWUJW of systems complete and found to be negative unless listed above PHYSICAL EXAM BP 140/85  Pulse 67  Ht 5' 7.5" (1.715 m)  Wt 298 lb (135.172 kg)  BMI 45.98 kg/m2 General: Well developed, well  nourished, in no acute distress, Obese Head: Eyes PERRLA, No xanthomas.   Normal cephalic and atramatic  Lungs: Clear bilaterally to auscultation and percussion. Heart: HRRR S1 S2, without MRG.  Pulses are 2+ & equal.            No carotid bruit. No JVD.  No abdominal bruits. No femoral bruits. Abdomen: Bowel sounds are positive, abdomen soft and non-tender without masses or                  Hernia's noted. Msk:  Back normal, normal gait. Normal strength and tone for age. Extremities: No clubbing, cyanosis or edema.  DP +1 Neuro: Alert and oriented X 3. Psych:  Good affect, responds appropriately   ASSESSMENT AND PLAN

## 2011-10-12 NOTE — Assessment & Plan Note (Signed)
Cardiac cath showed normal coronaries and normal LV fx.  No further cardiac recommendations with the exception of wt loss.  Will see on prn basis only.

## 2011-10-12 NOTE — Progress Notes (Deleted)
Anesthesia Post Note  Patient: Christopher Lambert  Procedure(s) Performed: * No surgery found *  Anesthesia type: {PROCEDURES; ANE POST ANESTHESIA TYPE:19480}  Patient location: {PLACES; ANE ZOXW:96045}  Post pain: {FINDINGS; ANE POST WUJW:11914}  Post assessment: {ASSESSMENT; ANE NWGN:56213}  Last Vitals:  Filed Vitals:   10/09/11 1105  BP: 140/85  Pulse: 67    Post vital signs: {DESC; ANE POST VITALS:19483}  Level of consciousness: {FINDINGS; ANE POST LEVEL OF CONSCIOUSNESS:19484}  Complications: {FINDINGS; ANE POST COMPLICATIONS:19485}

## 2011-12-10 LAB — COMPREHENSIVE METABOLIC PANEL
ALT: 19 U/L (ref 0–53)
AST: 19 U/L (ref 0–37)
Albumin: 3.9 g/dL (ref 3.5–5.2)
Alkaline Phosphatase: 40 U/L (ref 39–117)
Glucose, Bld: 121 mg/dL — ABNORMAL HIGH (ref 70–99)
Potassium: 3.5 mEq/L (ref 3.5–5.3)
Sodium: 140 mEq/L (ref 135–145)
Total Bilirubin: 0.9 mg/dL (ref 0.3–1.2)
Total Protein: 6.6 g/dL (ref 6.0–8.3)

## 2011-12-10 LAB — HEPATIC FUNCTION PANEL
ALT: 20 U/L (ref 0–53)
AST: 20 U/L (ref 0–37)
Albumin: 4 g/dL (ref 3.5–5.2)
Alkaline Phosphatase: 41 U/L (ref 39–117)
Indirect Bilirubin: 0.6 mg/dL (ref 0.0–0.9)
Total Protein: 6.6 g/dL (ref 6.0–8.3)

## 2011-12-10 LAB — PSA: PSA: 2 ng/mL (ref <=4.00)

## 2012-01-14 ENCOUNTER — Other Ambulatory Visit: Payer: Self-pay | Admitting: Family Medicine

## 2012-01-19 ENCOUNTER — Other Ambulatory Visit: Payer: Self-pay | Admitting: Family Medicine

## 2012-01-21 ENCOUNTER — Encounter: Payer: Self-pay | Admitting: Family Medicine

## 2012-01-21 ENCOUNTER — Ambulatory Visit (INDEPENDENT_AMBULATORY_CARE_PROVIDER_SITE_OTHER): Payer: BC Managed Care – PPO | Admitting: Family Medicine

## 2012-01-21 VITALS — BP 134/82 | HR 76 | Resp 16 | Ht 67.5 in | Wt 294.0 lb

## 2012-01-21 DIAGNOSIS — M129 Arthropathy, unspecified: Secondary | ICD-10-CM

## 2012-01-21 DIAGNOSIS — I10 Essential (primary) hypertension: Secondary | ICD-10-CM

## 2012-01-21 DIAGNOSIS — R7301 Impaired fasting glucose: Secondary | ICD-10-CM

## 2012-01-21 DIAGNOSIS — E669 Obesity, unspecified: Secondary | ICD-10-CM

## 2012-01-21 DIAGNOSIS — E8881 Metabolic syndrome: Secondary | ICD-10-CM

## 2012-01-21 DIAGNOSIS — Z1211 Encounter for screening for malignant neoplasm of colon: Secondary | ICD-10-CM

## 2012-01-21 DIAGNOSIS — J309 Allergic rhinitis, unspecified: Secondary | ICD-10-CM

## 2012-01-21 LAB — POC HEMOCCULT BLD/STL (OFFICE/1-CARD/DIAGNOSTIC): Fecal Occult Blood, POC: NEGATIVE

## 2012-01-21 NOTE — Patient Instructions (Signed)
F/u mid to end September.  Please call if you need me before.  No changes in medication.  You are referred to Dr. Eulah Pont mid July per your request.  Please be careful not to fall.  Weight loss goal of at least 3 pounds per month  HBA1c in September and chem 7

## 2012-01-21 NOTE — Progress Notes (Signed)
  Subjective:    Patient ID: Christopher Lambert, male    DOB: 04-26-1956, 56 y.o.   MRN: [id number]  HPI The PT is here for follow up and re-evaluation of chronic medical conditions, medication management and review of any available recent lab and radiology data.  Preventive health is updated, specifically  Cancer screening and Immunization.   Questions or concerns regarding consultations or procedures which the PT has had in the interim are  addressed. The PT denies any adverse reactions to current medications since the last visit.  There are no new concerns.  C/o increased left knee pain and instability, recently fell, mild trauma to thumb, ready for knee replacement      Review of Systems    See HPI Denies recent fever or chills. Denies sinus pressure, nasal congestion, ear pain or sore throat.Allergies increased but controlled Denies chest congestion, productive cough or wheezing. Denies chest pains, palpitations and leg swelling Denies abdominal pain, nausea, vomiting,diarrhea or constipation.   Denies dysuria, frequency, hesitancy or incontinence. Denies headaches, seizures, numbness, or tingling. Denies depression, anxiety or insomnia. Denies  rash.     Objective:   Physical Exam Patient alert and oriented and in no cardiopulmonary distress.  HEENT: No facial asymmetry, EOMI, no sinus tenderness,  oropharynx pink and moist.  Neck supple no adenopathy.  Chest: Clear to auscultation bilaterally.  CVS: S1, S2 no murmurs, no S3.  ABD: Soft non tender. Bowel sounds normal.  Ext: No edema  MS: Adequate ROM spine, shoulders, hips and reduced in leftknees.  Skin: Intact, no ulcerations or rash noted.  Psych: Good eye contact, normal affect. Memory intact not anxious or depressed appearing.  CNS: CN 2-12 intact, power, tone and sensation normal throughout.        Assessment & Plan:

## 2012-01-22 ENCOUNTER — Ambulatory Visit: Payer: BC Managed Care – PPO | Admitting: Family Medicine

## 2012-01-23 DIAGNOSIS — E8881 Metabolic syndrome: Secondary | ICD-10-CM

## 2012-01-23 HISTORY — DX: Metabolic syndrome: E88.81

## 2012-01-23 HISTORY — DX: Metabolic syndrome: E88.810

## 2012-01-23 NOTE — Assessment & Plan Note (Signed)
Importance of lifestyle change stressed to lose weight and improve health

## 2012-01-23 NOTE — Assessment & Plan Note (Signed)
Has had right knee replacement, ready for left knee, increased pain and instability, recently fell

## 2012-01-23 NOTE — Assessment & Plan Note (Signed)
Controlled, no change in medication  

## 2012-01-23 NOTE — Assessment & Plan Note (Signed)
Unchanged. Patient re-educated about  the importance of commitment to a  minimum of 150 minutes of exercise per week. The importance of healthy food choices with portion control discussed. Encouraged to start a food diary, count calories and to consider  joining a support group. Sample diet sheets offered. Goals set by the patient for the next several months.    

## 2012-02-05 ENCOUNTER — Telehealth: Payer: Self-pay | Admitting: Internal Medicine

## 2012-02-05 DIAGNOSIS — G4733 Obstructive sleep apnea (adult) (pediatric): Secondary | ICD-10-CM

## 2012-02-05 NOTE — Telephone Encounter (Signed)
Per CY-okay to send RX.

## 2012-02-05 NOTE — Telephone Encounter (Signed)
Order was sent to [facility]  LMTCB for the pt  

## 2012-02-05 NOTE — Telephone Encounter (Signed)
CY please advise if ok to send in rx to [facility] for the cpap supplies.  thanks

## 2012-02-09 NOTE — Telephone Encounter (Signed)
Spoke with pt and notified that order was sent to [facility]. Pt verbalized understanding and states nothing further needed.

## 2012-02-11 ENCOUNTER — Telehealth: Payer: Self-pay | Admitting: Internal Medicine

## 2012-02-11 NOTE — Telephone Encounter (Signed)
Apt has been moved to 6/11 at 9:15

## 2012-02-17 ENCOUNTER — Ambulatory Visit: Payer: BC Managed Care – PPO | Admitting: Internal Medicine

## 2012-02-23 ENCOUNTER — Encounter: Payer: Self-pay | Admitting: Internal Medicine

## 2012-02-23 ENCOUNTER — Ambulatory Visit (INDEPENDENT_AMBULATORY_CARE_PROVIDER_SITE_OTHER): Payer: BC Managed Care – PPO | Admitting: Internal Medicine

## 2012-02-23 VITALS — BP 116/62 | HR 62 | Ht 67.5 in | Wt 295.2 lb

## 2012-02-23 DIAGNOSIS — G4733 Obstructive sleep apnea (adult) (pediatric): Secondary | ICD-10-CM

## 2012-02-23 NOTE — Progress Notes (Signed)
Patient ID: Christopher Lambert, male    DOB: September 11, 1956, 56 y.o.   MRN: [id number]  HPI 02/13/11- OSA, rhinitis Last here August 20, 2010. Note reviewed. Continues CPAP 16, all night every night, all night. Still occasional yawn and nod. Tried Provigil for residual sleepiness but it caused tachy palpitation. He has not been a coffee drinker.  Mild nasal congestion early Spring, managed otc, but ok now.   08/19/11- 55 yoM never smoker followed for OSA, rhinitis, PAF PCP Dr Syliva Overman Has had flu vaccine. 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 Continues CPAP at 16 CWP with good compliance and control. Still experiences daytime sleepiness if quiet and will get himself caffeine to help. Has had intervals of sustained tachycardia palpitation at least twice. He describes a sudden onset without pain or stress, gradually easing off. He carries a diagnosis of atrial fib on his problem list.  02/23/12- 55 yoM never smoker followed for OSA, rhinitis,     PCP Dr Judie Petit. [name] Wears CPAP every night for approx 5-6 hours; pressure  working well; was checked by Cardiology [facility]) and given okay on heart. Good with CPAP 16/ [name]. Waiting arrival for new mask. Some dry mouth  ROS-see HPI Constitutional:   No-   weight loss, night sweats, fevers, chills, fatigue, lassitude. HEENT:   No-  headaches, difficulty swallowing, tooth/dental problems, sore throat,       No-  sneezing, itching, ear ache, nasal congestion, post nasal drip,  CV:  No-   chest pain, orthopnea, PND, swelling in lower extremities, anasarca, dizziness, palpitations Resp: No-   shortness of breath with exertion or at rest.              No-   productive cough,  No non-productive cough,  No- coughing up of blood.              No-   change in color of mucus.  No- wheezing.   Skin: No-   rash or lesions. GI:  No-   heartburn, indigestion, abdominal pain, nausea, vomiting, GU:  MS:  No-   joint pain or swelling.   Neuro-     nothing  unusual Psych:  No- change in mood or affect. No depression or anxiety.  No memory loss.  Objective:   Physical Exam General- Alert, Oriented, Affect-appropriate, Distress- none acute, obese Skin- rash-none, lesions- none, excoriation- none Lymphadenopathy- none Head- atraumatic            Eyes- Gross vision intact, PERRLA, conjunctivae clear secretions            Ears- Hearing, canals-normal            Nose- Clear, no-Septal dev, mucus, polyps, erosion, perforation             Throat- Mallampati IV , mucosa clear and it seems normally moist, drainage- none, tonsils- atrophic Neck- flexible , trachea midline, no stridor , thyroid nl, carotid no bruit Chest - symmetrical excursion , unlabored           Heart/CV- RRR , no murmur , no gallop  , no rub, nl s1 s2                           - JVD- none , edema- none, stasis changes- none, varices- none           Lung- clear to P&A, wheeze- none, cough- none , dullness-none, rub- none  Chest wall-  Abd-  Br/ Gen/ Rectal- Not done, not indicated Extrem- cyanosis- none, clubbing, none, atrophy- none, strength- nl Neuro- grossly intact to observation

## 2012-02-23 NOTE — Patient Instructions (Addendum)
For dry mouth- you can check at your drug store for a chin strap to be worn in addition to CPAP. It might help dryness by keeping your mouth closed.  You can also check at drug store for Biotene for dry mouth. See if it helps if used at bedtime.  We will continue your pressure at 16 for now. That can be changed later if we need to.

## 2012-02-28 NOTE — Assessment & Plan Note (Signed)
Good compliance and control with CPAP therapy For his dry mouth I suggested a trial of a chin strap and I suggested Biotene

## 2012-03-08 ENCOUNTER — Ambulatory Visit: Payer: BC Managed Care – PPO | Admitting: Internal Medicine

## 2012-03-28 ENCOUNTER — Telehealth: Payer: Self-pay | Admitting: Internal Medicine

## 2012-03-28 NOTE — Telephone Encounter (Signed)
Pt is needing new cpap mask and supplies but [name] cannot find where we sent an order for this. Order was sent back in May 2013. Order has been reprinted and faxed [name] as requested, ATTN:  Evalee Jefferson.

## 2012-05-04 ENCOUNTER — Encounter (HOSPITAL_COMMUNITY): Payer: Self-pay | Admitting: Respiratory Therapy

## 2012-05-11 NOTE — Pre-Procedure Instructions (Signed)
20 BROUGHTON EPPINGER  05/11/2012   Your procedure is scheduled on:  Wed, Sept 4 @ 1:15 PM  Report to [facility] at 10:15 AM.  Call this number if you have problems the morning of surgery: [phone]   Remember:   Do not eat food:After Midnight.    Take these medicines the morning of surgery with A SIP OF WATER: Nifedipine(Procardia),Fluticasone(Flonase),Periostat(Doxycycline),Clindamycin(Cleocin),and Allopurinol(Zyloprim)   Do not wear jewelry  Do not wear lotions, powders, or colognes  Men may shave face and neck.  Do not bring valuables to the hospital.  Contacts, dentures or bridgework may not be worn into surgery.  Leave suitcase in the car. After surgery it may be brought to your room.  For patients admitted to the hospital, checkout time is 11:00 AM the day of discharge.   Patients discharged the day of surgery will not be allowed to drive home.  Special Instructions: CHG Shower Use Special Wash: 1/2 bottle night before surgery and 1/2 bottle morning of surgery.   Please read over the following fact sheets that you were given: Pain Booklet, Coughing and Deep Breathing, Blood Transfusion Information, Total Joint Packet, MRSA Information and Surgical Site Infection Prevention

## 2012-05-12 ENCOUNTER — Encounter (HOSPITAL_COMMUNITY): Payer: Self-pay

## 2012-05-12 ENCOUNTER — Encounter (HOSPITAL_COMMUNITY)
Admission: RE | Admit: 2012-05-12 | Discharge: 2012-05-12 | Disposition: A | Discharge status: Home / Self Care | Payer: BC Managed Care – PPO | Source: Ambulatory Visit | Attending: Orthopedic Surgery | Admitting: Orthopedic Surgery

## 2012-05-12 HISTORY — DX: Other hemorrhoids: K64.8

## 2012-05-12 HISTORY — DX: Personal history of other infectious and parasitic diseases: Z86.19

## 2012-05-12 LAB — COMPREHENSIVE METABOLIC PANEL WITH GFR
ALT: 21 U/L (ref 0–53)
AST: 20 U/L (ref 0–37)
Albumin: 3.9 g/dL (ref 3.5–5.2)
Alkaline Phosphatase: 48 U/L (ref 39–117)
BUN: 16 mg/dL (ref 6–23)
CO2: 31 meq/L (ref 19–32)
Calcium: 9.4 mg/dL (ref 8.4–10.5)
Chloride: 101 meq/L (ref 96–112)
Creatinine, Ser: 1.08 mg/dL (ref 0.50–1.35)
GFR calc Af Amer: 87 mL/min — ABNORMAL LOW
GFR calc non Af Amer: 75 mL/min — ABNORMAL LOW
Glucose, Bld: 143 mg/dL — ABNORMAL HIGH (ref 70–99)
Potassium: 3 meq/L — ABNORMAL LOW (ref 3.5–5.1)
Sodium: 141 meq/L (ref 135–145)
Total Bilirubin: 0.7 mg/dL (ref 0.3–1.2)
Total Protein: 7.5 g/dL (ref 6.0–8.3)

## 2012-05-12 LAB — URINALYSIS, ROUTINE W REFLEX MICROSCOPIC
Bilirubin Urine: NEGATIVE
Glucose, UA: NEGATIVE mg/dL
Ketones, ur: NEGATIVE mg/dL
Leukocytes, UA: NEGATIVE
Nitrite: NEGATIVE
Protein, ur: NEGATIVE mg/dL
Specific Gravity, Urine: 1.021 (ref 1.005–1.030)
Urobilinogen, UA: 0.2 mg/dL (ref 0.0–1.0)
pH: 6 (ref 5.0–8.0)

## 2012-05-12 LAB — TYPE AND SCREEN
ABO/RH(D): A POS
Antibody Screen: NEGATIVE

## 2012-05-12 LAB — URINE MICROSCOPIC-ADD ON

## 2012-05-12 LAB — CBC
MCH: 30.8 pg (ref 26.0–34.0)
MCHC: 34.9 g/dL (ref 30.0–36.0)
Platelets: 195 10*3/uL (ref 150–400)

## 2012-05-12 LAB — APTT: aPTT: 31 seconds (ref 24–37)

## 2012-05-12 LAB — SURGICAL PCR SCREEN: MRSA, PCR: NEGATIVE

## 2012-05-12 LAB — PROTIME-INR
INR: 1 (ref 0.00–1.49)
Prothrombin Time: 13.4 s (ref 11.6–15.2)

## 2012-05-12 NOTE — Progress Notes (Signed)
Calf size 21 1/2 in; no TED hose to fit ; [facility] @ Dr Sherene Sires office notified. She will notify Dr Thurston Hole.

## 2012-05-12 NOTE — Progress Notes (Signed)
Pt to bring CPAP mask to hospital.

## 2012-05-15 HISTORY — PX: OTHER SURGICAL HISTORY: SHX169

## 2012-05-17 MED ORDER — DEXTROSE 5 % IV SOLN
3.0000 g | INTRAVENOUS | Status: AC
  Administered 2012-05-18: 3 g via INTRAVENOUS
  Filled 2012-05-17 (×2): qty 3000

## 2012-05-17 NOTE — H&P (Signed)
  [facility] [address]   [address] [place], [place] [address] [phone] A Division of [facility]  Loreta Ave, M.D.   Robert A. Thurston Hole, M.D.   Burnell Blanks, M.D.   Eulas Post, M.D.   Lunette Stands, M.D Buford Dresser, M.D.  Charlsie Quest, M.D.   Estell Harpin, M.D.   Melina Fiddler, M.D. Genene Churn. Barry Dienes, PA-C            Kirstin A. Shepperson, PA-C Josh Mantoloking, PA-C [place], [place]   RE: Christopher Lambert, Christopher Lambert                                [id number]      DOB: 10-04-1955 PROGRESS NOTE: 05-10-12 Chief complaint: Left knee pain.  History of present illness: 60 five year-old black male with a history of end stage DJD, left knee, and chronic pain.  Returns.  States that knee symptoms unchanged from previous visit.  He is wanting to proceed with total knee replacement as scheduled.   Current medications: See attached list. Allergies: No known drug allergies. Past medical/surgical history: Status post right total knee replacement in 2011, Type II diabetes, hypertension, sleep apnea, seasonal allergies and gout.  Review of systems: Patient currently denies lightheadedness, dizziness, fevers or chills, cardiac, pulmonary, GI or GU issues. Family history: Negative. Social history: Does not smoke or drink.  Patient is married.      EXAMINATION: Height: 5?7.  Weight: 286 pounds.  Temperature: 98.7.  Respirations: 16.  Blood pressure: 142/84.  Pulse: 56.  Pleasant, obese black male, alert and oriented x 3 and in no acute distress.  No increase in respiratory effort.  Head is normocephalic, a traumatic.  PERRLA, EOMI.  Cervical spine unremarkable.  Lungs: CTA bilaterally.  No wheezes.  Heart: RR, bradycardic. No murmurs.  Abdomen: Round.  NBS x 4.  Soft and non-tender.  Gait antalgic.  Left knee: Decreased range of motion.  Positive crepitus.  Joint line tender.  Positive effusion.  Ligaments stable.  Calf  non-tender.  Neurovascularly intact.  Skin warm and dry.     X-RAYS: Left knee show end stage DJD with periarticular spurs.    IMPRESSION: End stage DJD, left knee.  Failed conservative treatment.  PLAN: We will proceed with left total knee replacement as scheduled.  Surgical procedure, along with potential rehab/recovery time discussed.  All questions answered.    Loreta Ave, M.D.   Electronically verified by Loreta Ave, M.D. DFM(JMO):jjh D 05-13-12 T 05-13-12

## 2012-05-18 ENCOUNTER — Inpatient Hospital Stay (HOSPITAL_COMMUNITY): Payer: BC Managed Care – PPO

## 2012-05-18 ENCOUNTER — Encounter (HOSPITAL_COMMUNITY): Payer: Self-pay | Admitting: Anesthesiology

## 2012-05-18 ENCOUNTER — Inpatient Hospital Stay (HOSPITAL_COMMUNITY): Payer: BC Managed Care – PPO | Admitting: Anesthesiology

## 2012-05-18 ENCOUNTER — Inpatient Hospital Stay (HOSPITAL_COMMUNITY)
Admission: RE | Admit: 2012-05-18 | Discharge: 2012-05-20 | DRG: 209 | Disposition: A | Discharge status: Home Health | Payer: BC Managed Care – PPO | Source: Ambulatory Visit | Attending: Orthopedic Surgery | Admitting: Orthopedic Surgery

## 2012-05-18 ENCOUNTER — Encounter (HOSPITAL_COMMUNITY)
Admission: RE | Disposition: A | Discharge status: Home Health | Payer: Self-pay | Source: Ambulatory Visit | Attending: Orthopedic Surgery

## 2012-05-18 DIAGNOSIS — Z01818 Encounter for other preprocedural examination: Secondary | ICD-10-CM

## 2012-05-18 DIAGNOSIS — Z23 Encounter for immunization: Secondary | ICD-10-CM

## 2012-05-18 DIAGNOSIS — G4733 Obstructive sleep apnea (adult) (pediatric): Secondary | ICD-10-CM | POA: Diagnosis present

## 2012-05-18 DIAGNOSIS — M1710 Unilateral primary osteoarthritis, unspecified knee: Principal | ICD-10-CM | POA: Diagnosis present

## 2012-05-18 DIAGNOSIS — Z96659 Presence of unspecified artificial knee joint: Secondary | ICD-10-CM

## 2012-05-18 DIAGNOSIS — M109 Gout, unspecified: Secondary | ICD-10-CM | POA: Diagnosis present

## 2012-05-18 DIAGNOSIS — Z01812 Encounter for preprocedural laboratory examination: Secondary | ICD-10-CM

## 2012-05-18 DIAGNOSIS — E119 Type 2 diabetes mellitus without complications: Secondary | ICD-10-CM | POA: Diagnosis present

## 2012-05-18 DIAGNOSIS — Z01811 Encounter for preprocedural respiratory examination: Secondary | ICD-10-CM

## 2012-05-18 DIAGNOSIS — I10 Essential (primary) hypertension: Secondary | ICD-10-CM | POA: Diagnosis present

## 2012-05-18 HISTORY — PX: TOTAL KNEE ARTHROPLASTY: SHX125

## 2012-05-18 SURGERY — ARTHROPLASTY, KNEE, TOTAL
Anesthesia: General | Site: Knee | Laterality: Left | Wound class: Clean

## 2012-05-18 MED ORDER — HYDROCHLOROTHIAZIDE 25 MG PO TABS
25.0000 mg | ORAL_TABLET | Freq: Every day | ORAL | Status: DC
  Administered 2012-05-18 – 2012-05-19 (×2): 25 mg via ORAL
  Filled 2012-05-18 (×5): qty 1

## 2012-05-18 MED ORDER — ACETAMINOPHEN 325 MG PO TABS: 650.0000 mg | ORAL_TABLET | Freq: Four times a day (QID) | ORAL | Status: DC | PRN

## 2012-05-18 MED ORDER — PROPOFOL 10 MG/ML IV EMUL
INTRAVENOUS | Status: DC | PRN
  Administered 2012-05-18: 250 mg via INTRAVENOUS

## 2012-05-18 MED ORDER — METHOCARBAMOL 100 MG/ML IJ SOLN
500.0000 mg | Freq: Four times a day (QID) | INTRAVENOUS | Status: DC | PRN
  Filled 2012-05-18: qty 5

## 2012-05-18 MED ORDER — ENOXAPARIN SODIUM 30 MG/0.3ML ~~LOC~~ SOLN
30.0000 mg | Freq: Two times a day (BID) | SUBCUTANEOUS | Status: DC
  Administered 2012-05-19 – 2012-05-20 (×3): 30 mg via SUBCUTANEOUS
  Filled 2012-05-18 (×5): qty 0.3

## 2012-05-18 MED ORDER — LACTATED RINGERS IV SOLN
INTRAVENOUS | Status: DC | PRN
  Administered 2012-05-18 (×2): via INTRAVENOUS

## 2012-05-18 MED ORDER — MORPHINE SULFATE (PF) 1 MG/ML IV SOLN
INTRAVENOUS | Status: AC
  Filled 2012-05-18: qty 25

## 2012-05-18 MED ORDER — METOCLOPRAMIDE HCL 5 MG/ML IJ SOLN: 5.0000 mg | Freq: Three times a day (TID) | INTRAMUSCULAR | Status: DC | PRN

## 2012-05-18 MED ORDER — MORPHINE SULFATE 4 MG/ML IJ SOLN
INTRAMUSCULAR | Status: DC | PRN
  Administered 2012-05-18: 4 mg via SUBCUTANEOUS

## 2012-05-18 MED ORDER — METHOCARBAMOL 500 MG PO TABS
500.0000 mg | ORAL_TABLET | Freq: Four times a day (QID) | ORAL | Status: DC | PRN
  Administered 2012-05-19: 500 mg via ORAL
  Filled 2012-05-18: qty 1

## 2012-05-18 MED ORDER — MORPHINE SULFATE (PF) 1 MG/ML IV SOLN
INTRAVENOUS | Status: DC
  Administered 2012-05-18 (×2): via INTRAVENOUS
  Administered 2012-05-18: 1.5 mg via INTRAVENOUS
  Administered 2012-05-18: 19.5 mg via INTRAVENOUS
  Administered 2012-05-18: 4.04 mg via INTRAVENOUS
  Administered 2012-05-19: 3 mg via INTRAVENOUS
  Administered 2012-05-19: 6 mg via INTRAVENOUS
  Filled 2012-05-18: qty 25

## 2012-05-18 MED ORDER — WARFARIN SODIUM 10 MG PO TABS
10.0000 mg | ORAL_TABLET | Freq: Once | ORAL | Status: AC
  Administered 2012-05-18: 10 mg via ORAL
  Filled 2012-05-18: qty 1

## 2012-05-18 MED ORDER — COUMADIN BOOK
Freq: Once | Status: AC
  Administered 2012-05-18: 19:00:00
  Filled 2012-05-18: qty 1

## 2012-05-18 MED ORDER — WARFARIN SODIUM 10 MG PO TABS: 10.0000 mg | ORAL_TABLET | Freq: Once | ORAL | Status: DC

## 2012-05-18 MED ORDER — BUPIVACAINE HCL (PF) 0.25 % IJ SOLN
INTRAMUSCULAR | Status: AC
  Filled 2012-05-18: qty 30

## 2012-05-18 MED ORDER — FLEET ENEMA 7-19 GM/118ML RE ENEM: 1.0000 | ENEMA | Freq: Once | RECTAL | Status: AC | PRN

## 2012-05-18 MED ORDER — BENAZEPRIL-HYDROCHLOROTHIAZIDE 20-25 MG PO TABS: 1.0000 | ORAL_TABLET | Freq: Every day | ORAL | Status: DC

## 2012-05-18 MED ORDER — ONDANSETRON HCL 4 MG/2ML IJ SOLN: 4.0000 mg | Freq: Four times a day (QID) | INTRAMUSCULAR | Status: DC | PRN

## 2012-05-18 MED ORDER — CEFAZOLIN SODIUM 1-5 GM-% IV SOLN
1.0000 g | Freq: Three times a day (TID) | INTRAVENOUS | Status: AC
  Administered 2012-05-18 – 2012-05-19 (×2): 1 g via INTRAVENOUS
  Filled 2012-05-18 (×2): qty 50

## 2012-05-18 MED ORDER — NIFEDIPINE ER OSMOTIC RELEASE 90 MG PO TB24
90.0000 mg | ORAL_TABLET | Freq: Every day | ORAL | Status: DC
  Administered 2012-05-19 – 2012-05-20 (×2): 90 mg via ORAL
  Filled 2012-05-18 (×3): qty 1

## 2012-05-18 MED ORDER — NEOSTIGMINE METHYLSULFATE 1 MG/ML IJ SOLN
INTRAMUSCULAR | Status: DC | PRN
  Administered 2012-05-18: 1.5 mg via INTRAVENOUS

## 2012-05-18 MED ORDER — MIDAZOLAM HCL 2 MG/2ML IJ SOLN
1.0000 mg | INTRAMUSCULAR | Status: DC | PRN
  Administered 2012-05-18: 2 mg via INTRAVENOUS

## 2012-05-18 MED ORDER — WARFARIN - PHARMACIST DOSING INPATIENT: Freq: Every day | Status: DC

## 2012-05-18 MED ORDER — FLUTICASONE PROPIONATE 50 MCG/ACT NA SUSP
2.0000 | Freq: Every day | NASAL | Status: DC
  Administered 2012-05-20: 2 via NASAL
  Filled 2012-05-18: qty 16

## 2012-05-18 MED ORDER — DOCUSATE SODIUM 100 MG PO CAPS
100.0000 mg | ORAL_CAPSULE | Freq: Two times a day (BID) | ORAL | Status: DC
  Administered 2012-05-18 – 2012-05-20 (×4): 100 mg via ORAL
  Filled 2012-05-18 (×5): qty 1

## 2012-05-18 MED ORDER — 0.9 % SODIUM CHLORIDE (POUR BTL) OPTIME
TOPICAL | Status: DC | PRN
  Administered 2012-05-18: 1000 mL

## 2012-05-18 MED ORDER — GLYCOPYRROLATE 0.2 MG/ML IJ SOLN
INTRAMUSCULAR | Status: DC | PRN
  Administered 2012-05-18: .2 mg via INTRAVENOUS

## 2012-05-18 MED ORDER — ACETAMINOPHEN 650 MG RE SUPP: 650.0000 mg | Freq: Four times a day (QID) | RECTAL | Status: DC | PRN

## 2012-05-18 MED ORDER — FENTANYL CITRATE 0.05 MG/ML IJ SOLN
INTRAMUSCULAR | Status: DC | PRN
  Administered 2012-05-18: 50 ug via INTRAVENOUS
  Administered 2012-05-18: 150 ug via INTRAVENOUS
  Administered 2012-05-18 (×2): 50 ug via INTRAVENOUS

## 2012-05-18 MED ORDER — BENAZEPRIL HCL 20 MG PO TABS
20.0000 mg | ORAL_TABLET | Freq: Every day | ORAL | Status: DC
  Administered 2012-05-19 – 2012-05-20 (×2): 20 mg via ORAL
  Filled 2012-05-18 (×3): qty 1

## 2012-05-18 MED ORDER — METOCLOPRAMIDE HCL 10 MG PO TABS: 5.0000 mg | ORAL_TABLET | Freq: Three times a day (TID) | ORAL | Status: DC | PRN

## 2012-05-18 MED ORDER — LIDOCAINE HCL (CARDIAC) 20 MG/ML IV SOLN
INTRAVENOUS | Status: DC | PRN
  Administered 2012-05-18: 80 mg via INTRAVENOUS

## 2012-05-18 MED ORDER — SODIUM CHLORIDE 0.9 % IJ SOLN: 9.0000 mL | INTRAMUSCULAR | Status: DC | PRN

## 2012-05-18 MED ORDER — DIPHENHYDRAMINE HCL 50 MG/ML IJ SOLN: 12.5000 mg | Freq: Four times a day (QID) | INTRAMUSCULAR | Status: DC | PRN

## 2012-05-18 MED ORDER — ALLOPURINOL 300 MG PO TABS
300.0000 mg | ORAL_TABLET | Freq: Every day | ORAL | Status: DC
  Administered 2012-05-18 – 2012-05-19 (×2): 300 mg via ORAL
  Filled 2012-05-18 (×3): qty 1

## 2012-05-18 MED ORDER — POTASSIUM CHLORIDE IN NACL 20-0.9 MEQ/L-% IV SOLN
INTRAVENOUS | Status: DC
  Administered 2012-05-18 – 2012-05-19 (×3): via INTRAVENOUS
  Filled 2012-05-18 (×6): qty 1000

## 2012-05-18 MED ORDER — DIPHENHYDRAMINE HCL 12.5 MG/5ML PO ELIX: 12.5000 mg | ORAL_SOLUTION | Freq: Four times a day (QID) | ORAL | Status: DC | PRN

## 2012-05-18 MED ORDER — PNEUMOCOCCAL VAC POLYVALENT 25 MCG/0.5ML IJ INJ
0.5000 mL | INJECTION | INTRAMUSCULAR | Status: AC
  Administered 2012-05-20: 0.5 mL via INTRAMUSCULAR
  Filled 2012-05-18: qty 0.5

## 2012-05-18 MED ORDER — SENNOSIDES-DOCUSATE SODIUM 8.6-50 MG PO TABS: 1.0000 | ORAL_TABLET | Freq: Every evening | ORAL | Status: DC | PRN

## 2012-05-18 MED ORDER — SODIUM CHLORIDE 0.9 % IR SOLN
Status: DC | PRN
  Administered 2012-05-18: 3000 mL

## 2012-05-18 MED ORDER — ONDANSETRON HCL 4 MG/2ML IJ SOLN
INTRAMUSCULAR | Status: DC | PRN
  Administered 2012-05-18: 4 mg via INTRAVENOUS

## 2012-05-18 MED ORDER — BUPIVACAINE HCL (PF) 0.25 % IJ SOLN
INTRAMUSCULAR | Status: DC | PRN
  Administered 2012-05-18: 30 mL via INTRA_ARTICULAR

## 2012-05-18 MED ORDER — NALOXONE HCL 0.4 MG/ML IJ SOLN: 0.4000 mg | INTRAMUSCULAR | Status: DC | PRN

## 2012-05-18 MED ORDER — HYDROMORPHONE HCL PF 1 MG/ML IJ SOLN: 0.2500 mg | INTRAMUSCULAR | Status: DC | PRN

## 2012-05-18 MED ORDER — ONDANSETRON HCL 4 MG PO TABS: 4.0000 mg | ORAL_TABLET | Freq: Four times a day (QID) | ORAL | Status: DC | PRN

## 2012-05-18 MED ORDER — WARFARIN VIDEO: Freq: Once | Status: DC

## 2012-05-18 MED ORDER — LACTATED RINGERS IV SOLN
INTRAVENOUS | Status: DC
  Administered 2012-05-18: 12:00:00 via INTRAVENOUS

## 2012-05-18 MED ORDER — ROCURONIUM BROMIDE 100 MG/10ML IV SOLN
INTRAVENOUS | Status: DC | PRN
  Administered 2012-05-18: 50 mg via INTRAVENOUS

## 2012-05-18 MED ORDER — MORPHINE SULFATE 4 MG/ML IJ SOLN
INTRAMUSCULAR | Status: AC
  Filled 2012-05-18: qty 1

## 2012-05-18 MED ORDER — FENTANYL CITRATE 0.05 MG/ML IJ SOLN
INTRAMUSCULAR | Status: AC
  Filled 2012-05-18: qty 2

## 2012-05-18 MED ORDER — FENTANYL CITRATE 0.05 MG/ML IJ SOLN
50.0000 ug | INTRAMUSCULAR | Status: DC | PRN
  Administered 2012-05-18: 100 ug via INTRAVENOUS

## 2012-05-18 MED ORDER — PHENOL 1.4 % MT LIQD: 1.0000 | OROMUCOSAL | Status: DC | PRN

## 2012-05-18 MED ORDER — MENTHOL 3 MG MT LOZG: 1.0000 | LOZENGE | OROMUCOSAL | Status: DC | PRN

## 2012-05-18 MED ORDER — SODIUM CHLORIDE 0.9 % IR SOLN
Status: DC | PRN
  Administered 2012-05-18: 1000 mL

## 2012-05-18 MED ORDER — MIDAZOLAM HCL 2 MG/2ML IJ SOLN
INTRAMUSCULAR | Status: AC
  Filled 2012-05-18: qty 2

## 2012-05-18 SURGICAL SUPPLY — 60 items
BANDAGE ELASTIC 4 VELCRO ST LF (GAUZE/BANDAGES/DRESSINGS) ×2 IMPLANT
BANDAGE ELASTIC 6 VELCRO ST LF (GAUZE/BANDAGES/DRESSINGS) ×1 IMPLANT
BANDAGE ESMARK 6X9 LF (GAUZE/BANDAGES/DRESSINGS) ×1 IMPLANT
BLADE SAG 18X100X1.27 (BLADE) ×4 IMPLANT
BLADE SURG 10 STRL SS (BLADE) ×4 IMPLANT
BNDG CMPR 9X6 STRL LF SNTH (GAUZE/BANDAGES/DRESSINGS) ×1
BNDG ESMARK 6X9 LF (GAUZE/BANDAGES/DRESSINGS) ×2
BOOTCOVER CLEANROOM LRG (PROTECTIVE WEAR) ×4 IMPLANT
BOWL SMART MIX CTS (DISPOSABLE) ×2 IMPLANT
CEMENT BONE SIMPLEX SPEEDSET (Cement) ×4 IMPLANT
CLOTH BEACON ORANGE TIMEOUT ST (SAFETY) ×2 IMPLANT
COVER BACK TABLE 24X17X13 BIG (DRAPES) ×2 IMPLANT
COVER SURGICAL LIGHT HANDLE (MISCELLANEOUS) ×2 IMPLANT
CUFF TOURNIQUET SINGLE 34IN LL (TOURNIQUET CUFF) ×1 IMPLANT
CUFF TOURNIQUET SINGLE 44IN (TOURNIQUET CUFF) ×1 IMPLANT
DRAPE EXTREMITY T 121X128X90 (DRAPE) ×2 IMPLANT
DRAPE PROXIMA HALF (DRAPES) ×2 IMPLANT
DRAPE U-SHAPE 47X51 STRL (DRAPES) ×2 IMPLANT
DRSG PAD ABDOMINAL 8X10 ST (GAUZE/BANDAGES/DRESSINGS) ×2 IMPLANT
DURAPREP 26ML APPLICATOR (WOUND CARE) ×2 IMPLANT
ELECT CAUTERY BLADE 6.4 (BLADE) ×2 IMPLANT
ELECT REM PT RETURN 9FT ADLT (ELECTROSURGICAL) ×2
ELECTRODE REM PT RTRN 9FT ADLT (ELECTROSURGICAL) ×1 IMPLANT
EVACUATOR 1/8 PVC DRAIN (DRAIN) ×2 IMPLANT
FACESHIELD LNG OPTICON STERILE (SAFETY) ×2 IMPLANT
GAUZE XEROFORM 5X9 LF (GAUZE/BANDAGES/DRESSINGS) ×2 IMPLANT
GLOVE BIOGEL PI IND STRL 8 (GLOVE) ×1 IMPLANT
GLOVE BIOGEL PI INDICATOR 8 (GLOVE) ×1
GLOVE ORTHO TXT STRL SZ7.5 (GLOVE) ×2 IMPLANT
GOWN PREVENTION PLUS XLARGE (GOWN DISPOSABLE) ×4 IMPLANT
GOWN STRL NON-REIN LRG LVL3 (GOWN DISPOSABLE) ×4 IMPLANT
GOWN STRL REIN 2XL XLG LVL4 (GOWN DISPOSABLE) ×2 IMPLANT
HANDPIECE INTERPULSE COAX TIP (DISPOSABLE) ×2
IMMOBILIZER KNEE 22 UNIV (SOFTGOODS) ×2 IMPLANT
IMMOBILIZER KNEE 24 THIGH 36 (MISCELLANEOUS) IMPLANT
IMMOBILIZER KNEE 24 UNIV (MISCELLANEOUS)
KIT BASIN OR (CUSTOM PROCEDURE TRAY) ×2 IMPLANT
KIT ROOM TURNOVER OR (KITS) ×2 IMPLANT
MANIFOLD NEPTUNE II (INSTRUMENTS) ×2 IMPLANT
NS IRRIG 1000ML POUR BTL (IV SOLUTION) ×2 IMPLANT
PACK TOTAL JOINT (CUSTOM PROCEDURE TRAY) ×2 IMPLANT
PAD ARMBOARD 7.5X6 YLW CONV (MISCELLANEOUS) ×4 IMPLANT
PAD CAST 4YDX4 CTTN HI CHSV (CAST SUPPLIES) ×1 IMPLANT
PADDING CAST COTTON 4X4 STRL (CAST SUPPLIES) ×2
PADDING CAST COTTON 6X4 STRL (CAST SUPPLIES) ×2 IMPLANT
RUBBERBAND STERILE (MISCELLANEOUS) ×2 IMPLANT
SET HNDPC FAN SPRY TIP SCT (DISPOSABLE) ×1 IMPLANT
SPONGE GAUZE 4X4 12PLY (GAUZE/BANDAGES/DRESSINGS) ×2 IMPLANT
STAPLER VISISTAT 35W (STAPLE) ×2 IMPLANT
SUCTION FRAZIER TIP 10 FR DISP (SUCTIONS) ×2 IMPLANT
SUT VIC AB 1 CTX 36 (SUTURE) ×4
SUT VIC AB 1 CTX36XBRD ANBCTR (SUTURE) ×2 IMPLANT
SUT VIC AB 2-0 CT1 27 (SUTURE) ×4
SUT VIC AB 2-0 CT1 TAPERPNT 27 (SUTURE) ×2 IMPLANT
SYR 30ML LL (SYRINGE) ×2 IMPLANT
SYR 30ML SLIP (SYRINGE) ×2 IMPLANT
TOWEL OR 17X24 6PK STRL BLUE (TOWEL DISPOSABLE) ×2 IMPLANT
TOWEL OR 17X26 10 PK STRL BLUE (TOWEL DISPOSABLE) ×2 IMPLANT
TRAY FOLEY CATH 14FR (SET/KITS/TRAYS/PACK) ×2 IMPLANT
WATER STERILE IRR 1000ML POUR (IV SOLUTION) ×6 IMPLANT

## 2012-05-18 NOTE — Progress Notes (Signed)
Orthopedic Tech Progress Note Patient Details:  Christopher Lambert [birth year] [id number] Applied overhead frame and trapeze bar. CPM Left Knee CPM Left Knee: On Left Knee Flexion (Degrees): 60  Left Knee Extension (Degrees): 0    [name] 05/18/2012, 4:32 PM

## 2012-05-18 NOTE — Plan of Care (Signed)
Problem: Consults Goal: Diagnosis- Total Joint Replacement Primary Total Knee Left     

## 2012-05-18 NOTE — Anesthesia Procedure Notes (Signed)
Anesthesia Regional Block:  Femoral nerve block  Pre-Anesthetic Checklist: ,, timeout performed, Correct Patient, Correct Site, Correct Laterality, Correct Procedure, Correct Position, site marked, Risks and benefits discussed,  Surgical consent,  Pre-op evaluation,  At surgeon's request and post-op pain management  Laterality: Left  Prep: chloraprep       Needles:   Needle Type: Echogenic Needle          Additional Needles:  Procedures: Doppler guided and ultrasound guided Femoral nerve block  Nerve Stimulator or Paresthesia:  Response: 0.5 mA,   Additional Responses:   Narrative:  Start time: 05/18/2012 12:00 PM End time: 05/18/2012 12:15 PM Injection made incrementally with aspirations every 5 mL. Anesthesiologist: Dr. [name]  Femoral nerve block

## 2012-05-18 NOTE — Transfer of Care (Signed)
Immediate Anesthesia Transfer of Care Note  Patient: Christopher Lambert  Procedure(s) Performed: Procedure(s) (LRB) with comments: TOTAL KNEE ARTHROPLASTY (Left) - left total knee arthroplasty  Patient Location: PACU  Anesthesia Type: General and GA combined with regional for post-op pain  Level of Consciousness: awake, alert  and oriented  Airway & Oxygen Therapy: Patient Spontanous Breathing and Patient connected to face mask oxygen  Post-op Assessment: Report given to PACU RN  Post vital signs: Reviewed and stable  Complications: No apparent anesthesia complications

## 2012-05-18 NOTE — Progress Notes (Signed)
cpm on 0 - 60 4pm

## 2012-05-18 NOTE — Progress Notes (Addendum)
Placed pt on cpap 16cm h2o per home settings.  Pt is wearing his full face mask/tubing from home and tolerating well at this time.  RN notified.

## 2012-05-18 NOTE — Anesthesia Preprocedure Evaluation (Addendum)
Anesthesia Evaluation  Patient identified by MRN, date of birth, ID band Patient awake    Reviewed: Allergy & Precautions, H&P , NPO status , Patient's Chart, lab work & pertinent test results  Airway Mallampati: II      Dental   Pulmonary shortness of breath and with exertion, sleep apnea and Continuous Positive Airway Pressure Ventilation ,  breath sounds clear to auscultation        Cardiovascular hypertension, Pt. on medications Rhythm:Regular Rate:Normal     Neuro/Psych    GI/Hepatic negative GI ROS, Neg liver ROS,   Endo/Other  diabetes, Type 2  Renal/GU negative Renal ROS     Musculoskeletal   Abdominal   Peds  Hematology negative hematology ROS (+)   Anesthesia Other Findings   Reproductive/Obstetrics                           Anesthesia Physical Anesthesia Plan  ASA: III  Anesthesia Plan: General   Post-op Pain Management:    Induction: Intravenous  Airway Management Planned: Oral ETT  Additional Equipment:   Intra-op Plan:   Post-operative Plan: Extubation in OR  Informed Consent:   Dental advisory given  Plan Discussed with: CRNA, Anesthesiologist and Surgeon  Anesthesia Plan Comments:         Anesthesia Quick Evaluation

## 2012-05-18 NOTE — Interval H&P Note (Signed)
History and Physical Interval Note:  05/18/2012 8:11 AM  Christopher Lambert  has presented today for surgery, with the diagnosis of DJD LEFT KNEE  The various methods of treatment have been discussed with the patient and family. After consideration of risks, benefits and other options for treatment, the patient has consented to  Procedure(s) (LRB) with comments: TOTAL KNEE ARTHROPLASTY (Left) as a surgical intervention .  The patient's history has been reviewed, patient examined, no change in status, stable for surgery.  I have reviewed the patient's chart and labs.  Questions were answered to the patient's satisfaction.     La Dibella F

## 2012-05-18 NOTE — Brief Op Note (Signed)
05/18/2012  9:11 PM  PATIENT:  Christopher Lambert  56 y.o. male  PRE-OPERATIVE DIAGNOSIS:  DJD LEFT KNEE  POST-OPERATIVE DIAGNOSIS:  DJD LEFT KNEE  PROCEDURE:  Procedure(s) (LRB) with comments: TOTAL KNEE ARTHROPLASTY (Left) - left total knee arthroplasty  SURGEON:  Surgeon(s) and Role:    * Loreta Ave, MD - Primary  PHYSICIAN ASSISTANT: Zonia Kief M    ANESTHESIA:   regional and general  EBL:   minimal   BLOOD ADMINISTERED:none  SPECIMEN:  No Specimen  DISPOSITION OF SPECIMEN:  N/A  COUNTS:  YES  TOURNIQUET:   Total Tourniquet Time Documented: Thigh (Left) - 78 minutes   PATIENT DISPOSITION:  PACU - hemodynamically stable.

## 2012-05-18 NOTE — Progress Notes (Signed)
ANTICOAGULATION CONSULT NOTE - Initial Consult  Pharmacy Consult for Warfarin Indication: VTE px s/p L-TKA on 9/4  No Known Allergies  Patient Measurements: Height: 5\' 7"  (170.2 cm) Weight: 288 lb 2.3 oz (130.7 kg) IBW/kg (Calculated) : 66.1   Vital Signs: Temp: 97.6 F (36.4 C) (09/04 1630) Temp src: Oral (09/04 0948) BP: 112/61 mmHg (09/04 1630) Pulse Rate: 63  (09/04 1630)  Labs: No results found for this basename: HGB:2,HCT:3,PLT:3,APTT:3,LABPROT:3,INR:3,HEPARINUNFRC:3,CREATININE:3,CKTOTAL:3,CKMB:3,TROPONINI:3 in the last 72 hours  Estimated Creatinine Clearance: 100.5 ml/min (by C-G formula based on Cr of 1.08).   Medical History: Past Medical History  Diagnosis Date  . Seasonal allergies   . Essential hypertension, benign   . Obesity   . Arthritis of knee     Bilateral  . ED (erectile dysfunction)   . Hemorrhoids, internal   . H/O: whooping cough     aschild  . Diabetes mellitus     type 2 NIDDM x 7 yrs; off meds  . Sleep apnea     wears CPAP nightly  x 6 years    Assessment: 56 y.o. M admitted for planned L-TKA on 9/4. The patient is now post-op and pharmacy has been consulted to dose warfarin for VTE prophylaxis. Warfarin points >8, baseline INR 1, baseline Hgb/Hct 14.8/42.4. No new CBC or PT/INR this admission.   Goal of Therapy:  INR 2-3   Plan:  1. Warfarin 10 mg x 1 dose at 1800 today 2. Daily PT/INR 3. Warfarin book/video 4. Will continue to monitor for any signs/symptoms of bleeding and will follow up with PT/INR in the a.m.   Georgina Pillion, PharmD, BCPS Clinical Pharmacist Pager: [phone] 05/18/2012 6:29 PM

## 2012-05-18 NOTE — Anesthesia Postprocedure Evaluation (Signed)
  Anesthesia Post-op Note  Patient: Christopher Lambert  Procedure(s) Performed: Procedure(s) (LRB) with comments: TOTAL KNEE ARTHROPLASTY (Left) - left total knee arthroplasty  Patient Location: PACU  Anesthesia Type: General  Level of Consciousness: awake  Airway and Oxygen Therapy: Patient Spontanous Breathing  Post-op Pain: mild  Post-op Assessment: Post-op Vital signs reviewed  Post-op Vital Signs: Reviewed  Complications: No apparent anesthesia complications

## 2012-05-18 NOTE — Preoperative (Signed)
Beta Blockers   Reason not to administer Beta Blockers:Not Applicable 

## 2012-05-19 ENCOUNTER — Encounter (HOSPITAL_COMMUNITY): Payer: Self-pay | Admitting: Orthopedic Surgery

## 2012-05-19 LAB — BASIC METABOLIC PANEL
BUN: 14 mg/dL (ref 6–23)
CO2: 29 mEq/L (ref 19–32)
Calcium: 8.5 mg/dL (ref 8.4–10.5)
GFR calc non Af Amer: 81 mL/min — ABNORMAL LOW (ref >90)
Glucose, Bld: 154 mg/dL — ABNORMAL HIGH (ref 70–99)
Potassium: 3.6 mEq/L (ref 3.5–5.1)

## 2012-05-19 LAB — CBC
HCT: 39.8 % (ref 39.0–52.0)
Hemoglobin: 13.7 g/dL (ref 13.0–17.0)
MCH: 30.2 pg (ref 26.0–34.0)
MCHC: 34.4 g/dL (ref 30.0–36.0)
RBC: 4.53 MIL/uL (ref 4.22–5.81)

## 2012-05-19 LAB — PROTIME-INR: INR: 1.08 (ref 0.00–1.49)

## 2012-05-19 MED ORDER — WARFARIN SODIUM 10 MG PO TABS
10.0000 mg | ORAL_TABLET | Freq: Once | ORAL | Status: AC
  Administered 2012-05-19: 10 mg via ORAL
  Filled 2012-05-19: qty 1

## 2012-05-19 MED ORDER — OXYCODONE-ACETAMINOPHEN 5-325 MG PO TABS
1.0000 | ORAL_TABLET | ORAL | Status: DC | PRN
  Administered 2012-05-19: 2 via ORAL
  Administered 2012-05-19 (×2): 1 via ORAL
  Administered 2012-05-20 (×2): 2 via ORAL
  Filled 2012-05-19 (×2): qty 2
  Filled 2012-05-19: qty 1
  Filled 2012-05-19: qty 2
  Filled 2012-05-19: qty 1

## 2012-05-19 NOTE — Progress Notes (Signed)
Occupational Therapy Evaluation Patient Details Name: WINNIE BARSKY MRN: [id number] DOB: 24-Dec-1955 Today's Date: 05/19/2012 Time: 4098-1191 OT Time Calculation (min): 24 min  OT Assessment / Plan / Recommendation Clinical Impression  56 yo s/p L TKA. Pt making excellent progress. Pt @ Mod I to S level with all ADL and mobility for ADL. No euipment needs.     OT Assessment  Patient does not need any further OT services    Follow Up Recommendations  No OT follow up    Barriers to Discharge      Equipment Recommendations  None recommended by OT    Recommendations for Other Services    Frequency       Precautions / Restrictions Precautions Precautions: Knee Required Braces or Orthoses: Knee Immobilizer - Left Restrictions Weight Bearing Restrictions: Yes LLE Weight Bearing: Weight bearing as tolerated   Pertinent Vitals/Pain 3    ADL  Grooming: Performed;Modified independent Where Assessed - Grooming: Unsupported standing Upper Body Bathing: Performed;Set up Where Assessed - Upper Body Bathing: Unsupported sitting Lower Body Bathing: Performed;Set up Where Assessed - Lower Body Bathing: Unsupported sit to stand Upper Body Dressing: Performed;Set up Where Assessed - Upper Body Dressing: Unsupported sitting Lower Body Dressing: Performed;Set up Where Assessed - Lower Body Dressing: Unsupported sit to stand Toilet Transfer: Simulated;Modified independent Toilet Transfer Method: Sit to Barista: Other (comment) (bed - chair) Toileting - Clothing Manipulation and Hygiene: Performed;Modified independent Where Assessed - Toileting Clothing Manipulation and Hygiene: Standing Tub/Shower Transfer: Simulated;Modified independent Tub/Shower Transfer Method: Science writer: Other (comment) (will use 3 in 1) Equipment Used: Gait belt;Knee Immobilizer;Reacher;Rolling walker Transfers/Ambulation Related to ADLs: S ADL Comments: Pt @  mod I to S level.    OT Diagnosis:    OT Problem List:   OT Treatment Interventions:     OT Goals Acute Rehab OT Goals OT Goal Formulation:  (eval only)  Visit Information  Last OT Received On: 05/19/12 Assistance Needed: +1 PT/OT Co-Evaluation/Treatment: Yes    Subjective Data      Prior Functioning  Vision/Perception  Home Living Lives With: Spouse Available Help at Discharge: Family Type of Home: House Home Access: Ramped entrance Home Layout: Two level;Able to live on main level with bedroom/bathroom Bathroom Shower/Tub: Walk-in shower;Door;Other (comment) (threshold) Bathroom Toilet: Standard Home Adaptive Equipment: Bedside commode/3-in-1;Shower chair with back;Straight cane;Walker - rolling Prior Function Level of Independence: Independent Able to Take Stairs?: Reciprically Driving: Yes Vocation: Full time employment Comments: Designer, multimedia Communication: No difficulties      Cognition  Overall Cognitive Status: Appears within functional limits for tasks assessed/performed Arousal/Alertness: Awake/alert Orientation Level: Oriented X4 / Intact;Appears intact for tasks assessed Behavior During Session: [facility] for tasks performed    Extremity/Trunk Assessment Right Upper Extremity Assessment RUE ROM/Strength/Tone: [facility] for tasks assessed RUE Sensation: WFL - Light Touch;WFL - Proprioception RUE Coordination: WFL - gross/fine motor Left Upper Extremity Assessment LUE ROM/Strength/Tone: WFL for tasks assessed LUE Sensation: WFL - Light Touch;WFL - Proprioception LUE Coordination: WFL - gross/fine motor Right Lower Extremity Assessment RLE ROM/Strength/Tone: WFL for tasks assessed RLE Sensation: WFL - Light Touch;WFL - Proprioception RLE Coordination: WFL - gross/fine motor Left Lower Extremity Assessment LLE ROM/Strength/Tone: Deficits;Unable to fully assess;Due to pain;Due to precautions Trunk Assessment Trunk Assessment: Normal    Mobility    Bed Mobility Bed Mobility: Supine to Sit;Sitting - Scoot to Edge of Bed Supine to Sit: 6: Modified independent (Device/Increase time) Sitting - Scoot to Edge of Bed: 6:  Modified independent (Device/Increase time) Transfers Transfers: Sit to Stand;Stand to Sit Sit to Stand: 5: Supervision;With upper extremity assist;From bed Stand to Sit: 4: Min guard;To chair/3-in-1;With armrests       Exercise     Balance  WFL   End of Session OT - End of Session Equipment Utilized During Treatment: Gait belt;Left knee immobilizer Activity Tolerance: Patient tolerated treatment well Patient left: in chair;with call bell/phone within reach;with family/visitor present Nurse Communication: Mobility status  GO     Kerman Pfost,HILLARY 05/19/2012, 9:31 AM [facility], OTR/L  [phone] 05/19/2012

## 2012-05-19 NOTE — Op Note (Signed)
NAMECORDAY, WYKA NO.:  [id number]  MEDICAL RECORD NO.:  [id number]  LOCATION:  5N01C                        FACILITY:  [facility]  PHYSICIAN:  Loreta Ave, M.D. DATE OF BIRTH:  Mar 19, 1956  DATE OF PROCEDURE:  05/18/2012 DATE OF DISCHARGE:                              OPERATIVE REPORT   PREOPERATIVE DIAGNOSIS:  Left knee end-stage degenerative arthritis, varus alignment.  POSTOPERATIVE DIAGNOSIS:  Left knee end-stage degenerative arthritis, varus alignment.  PROCEDURE:  Modified minimally invasive left total knee replacement, Stryker triathlon prosthesis.  Soft tissue balancing.  Cemented pegged posterior stabilized #6 femoral component.  Cemented #6 tibial component, 11 mm polyethylene insert.  Cemented medial offset pegged 35 mm patellar component.  SURGEON:  Loreta Ave, M.D.  ASSISTANT:  Genene Churn. Barry Dienes, PA present throughout the entire case and necessary for timely completion of procedure.  ANESTHESIA:  General.  BLOOD LOSS:  Minimal.  SPECIMENS:  None.  CULTURES:  None.  COMPLICATIONS:  None.  DRESSINGS:  Soft compressive knee immobilizer.  DRAINS:  Hemovac x1.  TOURNIQUET TIME:  1 hour.  PROCEDURE:  The patient was brought to the operating room, placed on the operating table in supine position.  After adequate level of anesthesia had been obtained, left knee examined.  Fairly good extension, flexion better at 90 degrees.  Varus alignment correctable.  Tourniquet applied. Prepped and draped in usual sterile fashion.  Exsanguinated with elevation, Esmarch.  Tourniquet inflated to 350 mmHg.  Straight incision above the patella down to tibial tubercle.  Hemostasis with cautery. Medial arthrotomy, vastus splitting, preserving quad tendon.  Knee exposed.  Medial capsule release.  Remnants of menisci, periarticular spurs, cruciate ligaments removed.  Distal femur exposed. Intramedullary guide placed.  10 mm resection, 5 degrees of  valgus. Using epicondylar axis, the femur was sized, cut, and fitted for a posterior stabilized pegged #6 component.  Proximal tibial resection extramedullary guide.  Size #6 component.  Debris cleared throughout all recesses.  Patella exposed, posterior 10 mm removed.  Drilled, sized, and fitted for a 35 mm component.  Thorough lavage throughout.  Trial was put in place.  With the 11 mm insert on the tibia, I was very pleased with mechanical axis, balancing in flexion/extension and patellar tracking.  Tibia was marked for rotation and reamed.  All trials removed.  Copious irrigation with pulse irrigating device. Cement prepared, placed on all components, firmly seated.  Patella held with a clamp.  Once the cement had hardened, the knee was reexamined. Excellent biomechanical axis.  Nicely balanced in flexion/extension. Stable in flexion and extension as well.  Good patellar tracking. Hemovac was placed and brought out through a separate stab wound. Arthrotomy closed with #1 Vicryl.  Skin and subcutaneous tissue with Vicryl and staples.  Sterile compressive dressing applied.  Tourniquet deflated and removed.  Knee immobilizer applied.  Anesthesia reversed. Brought to the recovery room.  Tolerated surgery well.  No complications.     Loreta Ave, M.D.     DFM/MEDQ  D:  05/18/2012  T:  05/19/2012  Job:  [id number]

## 2012-05-19 NOTE — Progress Notes (Addendum)
ANTICOAGULATION CONSULT NOTE - Follow-up  Pharmacy Consult for Warfarin Indication: VTE px s/p L-TKA on 9/4  No Known Allergies  Patient Measurements: Height: 5\' 7"  (170.2 cm) Weight: 288 lb 2.3 oz (130.7 kg) IBW/kg (Calculated) : 66.1   Vital Signs: Temp: 97.8 F (36.6 C) (09/05 0713) BP: 120/62 mmHg (09/05 0713) Pulse Rate: 64  (09/05 0713)  Labs:  Basename 05/19/12 0640  HGB 13.7  HCT 39.8  PLT 177  APTT --  LABPROT 14.2  INR 1.08  HEPARINUNFRC --  CREATININE --  CKTOTAL --  CKMB --  TROPONINI --    Estimated Creatinine Clearance: 100.5 ml/min (by C-G formula based on Cr of 1.08).   Medical History: Past Medical History  Diagnosis Date  . Seasonal allergies   . Essential hypertension, benign   . Obesity   . Arthritis of knee     Bilateral  . ED (erectile dysfunction)   . Hemorrhoids, internal   . H/O: whooping cough     aschild  . Diabetes mellitus     type 2 NIDDM x 7 yrs; off meds  . Sleep apnea     wears CPAP nightly  x 6 years    Assessment: 56 y.o. M admitted for planned L-TKA on 9/4. The patient is now post-op and pharmacy has been consulted to dose warfarin for VTE prophylaxis. Warfarin points >8, baseline INR 1, baseline Hgb/Hct 14.8/42.4. POD 1: Hgb/Hct: 13.7/39.8, INR 1.08.    Goal of Therapy:  INR 2-3   Plan:  1. Repeat Warfarin 10 mg x 1 dose at 1800 today 2. Daily PT/INR 3. Will continue to monitor for any signs/symptoms of bleeding and will follow up with PT/INR in the a.m.     05/19/2012 8:19 AM   Thanks for allowing pharmacy to be a part of this patient's care.  Talbert Cage, PharmD Clinical Pharmacist, [phone]

## 2012-05-19 NOTE — Progress Notes (Signed)
Referral received for SNF. Chart reviewed and CSW has spoken with RNCM who indicates that patient is for DC to home with Home Health and DME if indicated.  CSW to sign off. Please re-consult if CSW needs arise.  Lorri Frederick. [place]  [phone]

## 2012-05-19 NOTE — Progress Notes (Signed)
CARE MANAGEMENT NOTE 05/19/2012  Patient:  Christopher Lambert, Christopher Lambert   Account Number:  [id number]  Date Initiated:  05/19/2012  Documentation initiated by:  Vance Peper  Subjective/Objective Assessment:   56 yr old male s/p left total knee arthroplasty     Action/Plan:   Cm spoke with patient regarding home health and DME needs at discharge. Choice offered. Patient preoperatively setup with [facility], no changes. Rolling walker, 3in1 and CPM have been delivered to the home. Patient has family support .   Anticipated DC Date:  05/20/2012   Anticipated DC Plan:  HOME W HOME HEALTH SERVICES      DC Planning Services  CM consult      [facility] Choice  HOME HEALTH   Choice offered to / List presented to:  C-1 Patient        HH arranged  HH-2 PT  HH-1 RN      [facility] agency  [facility]   Status of service:  Completed, signed off Medicare Important Message given?   (If response is "NO", the following Medicare IM given date fields will be blank) Date Medicare IM given:   Date Additional Medicare IM given:    Discharge Disposition:  HOME W HOME HEALTH SERVICES  Per UR Regulation:    If discussed at Long Length of Stay Meetings, dates discussed:    Comments:

## 2012-05-19 NOTE — Progress Notes (Signed)
UR COMPLETED  

## 2012-05-19 NOTE — Progress Notes (Signed)
Physical Therapy Treatment Patient Details Name: Christopher Lambert MRN: [id number] DOB: [birth year] Today's Date: 05/19/2012 Time: 8295-6213 PT Time Calculation (min): 27 min  PT Assessment / Plan / Recommendation Comments on Treatment Session  Pt tolerated treatment very well. Was able toincrease ambulation significantly and tolerated initiation and set up on CPM maching at end of treatment session.    Follow Up Recommendations  Home health PT    Barriers to Discharge        Equipment Recommendations  None recommended by PT    Recommendations for Other Services    Frequency 7X/week   Plan Discharge plan remains appropriate    Precautions / Restrictions Precautions Precautions: Knee Required Braces or Orthoses: Knee Immobilizer - Left Restrictions Weight Bearing Restrictions: Yes LLE Weight Bearing: Weight bearing as tolerated   Pertinent Vitals/Pain 4/10    Mobility  Bed Mobility Bed Mobility: Sit to Supine Sit to Supine: 6: Modified independent (Device/Increase time) Transfers Transfers: Sit to Stand;Stand to Sit Sit to Stand: 5: Supervision;From chair/3-in-1;With armrests Stand to Sit: 5: Supervision;To bed Ambulation/Gait Ambulation/Gait Assistance: 4: Min guard Ambulation Distance (Feet): 260 Feet Assistive device: Rolling walker Gait Pattern: Step-to pattern;Decreased stride length;Antalgic Gait velocity: decreased Stairs: No      PT Goals    Visit Information  Last PT Received On: 05/19/12 Assistance Needed: +2    Subjective Data  Subjective: i'm gonna get goingt so  I can go home Patient Stated Goal: to go home   Cognition  Overall Cognitive Status: Appears within functional limits for tasks assessed/performed Arousal/Alertness: Awake/alert Orientation Level: Oriented X4 / Intact;Appears intact for tasks assessed Behavior During Session: [facility] for tasks performed    Balance     End of Session PT - End of Session Equipment Utilized During Treatment:  Gait belt;Left knee immobilizer Activity Tolerance: Patient tolerated treatment well Patient left: in chair;with call bell/phone within reach Nurse Communication: Mobility status CPM Left Knee CPM Left Knee: On   GP     Fabio Asa 05/19/2012, 4:01 PM Charlotte Crumb, PT DPT  [phone]

## 2012-05-19 NOTE — Evaluation (Signed)
Physical Therapy Evaluation Patient Details Name: Christopher Lambert MRN: [id number] DOB: Feb 18, 1956 Today's Date: 05/19/2012 Time: 4098-1191 PT Time Calculation (min): 27 min  PT Assessment / Plan / Recommendation Clinical Impression  Pt is a pleasent 56 y.o. male s/p left knee TKA. Pt demonstrates good functional mobility despite deficits in ROM and strength.  Patient able to perform curb/signle step negotiation with ease. Feel patient will be safe for discharge with home PT to follow. Will continue to see to address ROM, mobility and HEP to maximize independence for discharge.    PT Assessment  Patient needs continued PT services    Follow Up Recommendations  Home health PT    Barriers to Discharge        Equipment Recommendations  None recommended by OT    Recommendations for Other Services     Frequency 7X/week    Precautions / Restrictions Precautions Precautions: Knee Required Braces or Orthoses: Knee Immobilizer - Left Restrictions Weight Bearing Restrictions: Yes LLE Weight Bearing: Weight bearing as tolerated   Pertinent Vitals/Pain 3/10      Mobility  Bed Mobility Bed Mobility: Supine to Sit;Sitting - Scoot to Edge of Bed Supine to Sit: 6: Modified independent (Device/Increase time) Sitting - Scoot to Edge of Bed: 6: Modified independent (Device/Increase time) Transfers Transfers: Sit to Stand;Stand to Sit Sit to Stand: 5: Supervision;With upper extremity assist;From bed Stand to Sit: 5: Supervision;To chair/3-in-1;With armrests Ambulation/Gait Ambulation/Gait Assistance: 4: Min guard Ambulation Distance (Feet): 25 Feet Assistive device: Rolling walker Gait Pattern: Step-to pattern;Decreased stride length;Antalgic Gait velocity: decreased Stairs: Yes Stairs Assistance: 5: Supervision Stair Management Technique: No rails;With walker;Backwards;Forwards Number of Stairs: 1     Exercises     PT Diagnosis: Abnormality of gait;Generalized weakness;Acute pain    PT Problem List: Decreased strength;Decreased range of motion;Pain PT Treatment Interventions: Gait training;Stair training;Therapeutic activities;Therapeutic exercise;Patient/family education   PT Goals    Visit Information  Last PT Received On: 05/19/12 Assistance Needed: +1    Subjective Data  Subjective: i'm gonna get goingt so  I can go home Patient Stated Goal: to go home   Prior Functioning  Home Living Lives With: Spouse Available Help at Discharge: Family Type of Home: House Home Access: Ramped entrance Home Layout: Two level;Able to live on main level with bedroom/bathroom Bathroom Shower/Tub: Walk-in shower;Door;Other (comment) (threshold) Bathroom Toilet: Standard Home Adaptive Equipment: Bedside commode/3-in-1;Shower chair with back;Straight cane;Walker - rolling Prior Function Level of Independence: Independent Able to Take Stairs?: Reciprically Driving: Yes Vocation: Full time employment Comments: Designer, multimedia Communication: No difficulties    Cognition  Overall Cognitive Status: Appears within functional limits for tasks assessed/performed Arousal/Alertness: Awake/alert Orientation Level: Oriented X4 / Intact;Appears intact for tasks assessed Behavior During Session: [facility] for tasks performed    Extremity/Trunk Assessment Right Upper Extremity Assessment RUE ROM/Strength/Tone: [facility] for tasks assessed RUE Sensation: WFL - Light Touch;WFL - Proprioception RUE Coordination: WFL - gross/fine motor Left Upper Extremity Assessment LUE ROM/Strength/Tone: WFL for tasks assessed LUE Sensation: WFL - Light Touch;WFL - Proprioception LUE Coordination: WFL - gross/fine motor Right Lower Extremity Assessment RLE ROM/Strength/Tone: WFL for tasks assessed RLE Sensation: WFL - Light Touch;WFL - Proprioception RLE Coordination: WFL - gross/fine motor Left Lower Extremity Assessment LLE ROM/Strength/Tone: Deficits;Unable to fully assess;Due to  pain;Due to precautions Trunk Assessment Trunk Assessment: Normal   Balance    End of Session PT - End of Session Equipment Utilized During Treatment: Gait belt;Left knee immobilizer Activity Tolerance: Patient tolerated treatment well Patient  left: in chair;with call bell/phone within reach Nurse Communication: Mobility status CPM Left Knee CPM Left Knee: Off  GP     Fabio Asa 05/19/2012, 11:00 AM

## 2012-05-19 NOTE — Progress Notes (Signed)
Subjective: Doing well.  Pain controlled. Objective: Vital signs in last 24 hours: Temp:  [97 F (36.1 C)-97.8 F (36.6 C)] 97.8 F (36.6 C) (09/05 0713) Pulse Rate:  [48-68] 64  (09/05 0713) Resp:  [8-22] 18  (09/05 0725) BP: (112-132)/(56-67) 122/67 mmHg (09/05 0943) SpO2:  [95 %-100 %] 99 % (09/05 0725) Weight:  [130.7 kg (288 lb 2.3 oz)] 130.7 kg (288 lb 2.3 oz) (09/04 1640)  Intake/Output from previous day: 09/04 0701 - 09/05 0700 In: 2395 [I.V.:2345; IV Piggyback:50] Out: 2855 [Urine:2725; Drains:80; Blood:50] Intake/Output this shift:     [facility] 05/19/12 0640  HGB 13.7    Basename 05/19/12 0640  WBC 6.7  RBC 4.53  HCT 39.8  PLT 177    Basename 05/19/12 0640  NA 134*  K 3.6  CL 95*  CO2 29  BUN 14  CREATININE 1.02  GLUCOSE 154*  CALCIUM 8.5    Basename 05/19/12 0640  LABPT --  INR 1.08    Exam:  Dressing c/d/i.  Calf nt, nvi.    Assessment/Plan: Anticipate d/c home tomorrow.  D/c pca.  Start therapy.     Nyx Keady M 05/19/2012, 10:02 AM

## 2012-05-20 ENCOUNTER — Encounter (HOSPITAL_COMMUNITY): Payer: Self-pay | Admitting: General Practice

## 2012-05-20 LAB — CBC
HCT: 38.9 % — ABNORMAL LOW (ref 39.0–52.0)
Hemoglobin: 13.5 g/dL (ref 13.0–17.0)
MCV: 88.4 fL (ref 78.0–100.0)
RDW: 13.3 % (ref 11.5–15.5)
WBC: 6.7 10*3/uL (ref 4.0–10.5)

## 2012-05-20 LAB — PROTIME-INR
INR: 1.43 (ref 0.00–1.49)
Prothrombin Time: 17.7 seconds — ABNORMAL HIGH (ref 11.6–15.2)

## 2012-05-20 MED ORDER — MAGNESIUM CITRATE PO SOLN
1.0000 | Freq: Once | ORAL | Status: DC
  Filled 2012-05-20: qty 296

## 2012-05-20 MED ORDER — ENOXAPARIN SODIUM 30 MG/0.3ML ~~LOC~~ SOLN: 30.0000 mg | Freq: Two times a day (BID) | SUBCUTANEOUS | Status: DC

## 2012-05-20 MED ORDER — OXYCODONE-ACETAMINOPHEN 7.5-325 MG PO TABS: 1.0000 | ORAL_TABLET | ORAL | Status: AC | PRN

## 2012-05-20 MED ORDER — WARFARIN SODIUM 5 MG PO TABS: 5.0000 mg | ORAL_TABLET | Freq: Every day | ORAL | Status: DC

## 2012-05-20 MED ORDER — METHOCARBAMOL 500 MG PO TABS: 500.0000 mg | ORAL_TABLET | Freq: Four times a day (QID) | ORAL | Status: AC | PRN

## 2012-05-20 MED ORDER — WARFARIN SODIUM 7.5 MG PO TABS
7.5000 mg | ORAL_TABLET | Freq: Once | ORAL | Status: DC
  Filled 2012-05-20: qty 1

## 2012-05-20 NOTE — Progress Notes (Signed)
Subjective: Doing well.  Great with therapy.  Wants to go home today.   Objective: Vital signs in last 24 hours: Temp:  [98.1 F (36.7 C)-98.4 F (36.9 C)] 98.4 F (36.9 C) (09/06 0650) Pulse Rate:  [56-85] 56  (09/06 0650) Resp:  [18] 18  (09/06 0650) BP: (130-132)/(74-77) 130/77 mmHg (09/06 0650) SpO2:  [98 %-99 %] 98 % (09/06 0650)  Intake/Output from previous day: 09/05 0701 - 09/06 0700 In: 1040 [P.O.:840; I.V.:200] Out: 4400 [Urine:4250; Drains:150] Intake/Output this shift:     Basename 05/20/12 0655 05/19/12 0640  HGB 13.5 13.7    Basename 05/20/12 0655 05/19/12 0640  WBC 6.7 6.7  RBC 4.40 4.53  HCT 38.9* 39.8  PLT 161 177    Basename 05/19/12 0640  NA 134*  K 3.6  CL 95*  CO2 29  BUN 14  CREATININE 1.02  GLUCOSE 154*  CALCIUM 8.5    Basename 05/20/12 0655 05/19/12 0640  LABPT -- --  INR 1.43 1.08    Exam:  Wound looks good. Staples intact.  No signs of infection.  Calf nt, nvi.  Drain removed.  Assessment/Plan: D/c home today.  F/u 2 weeks postop.     OWENS,JAMES M 05/20/2012, 12:34 PM

## 2012-05-20 NOTE — Progress Notes (Signed)
Physical Therapy Treatment Patient Details Name: Christopher Lambert MRN: [id number] DOB: 10-Jan-1956 Today's Date: 05/20/2012 Time: 0802-0830 PT Time Calculation (min): 28 min  PT Assessment / Plan / Recommendation Comments on Treatment Session  Pt tolerated ther-ex and ambulation well.     Follow Up Recommendations  Home health PT       Equipment Recommendations  None recommended by PT       Frequency 7X/week   Plan Discharge plan remains appropriate    Precautions / Restrictions Precautions Precautions: Knee Required Braces or Orthoses: Knee Immobilizer - Left Restrictions Weight Bearing Restrictions: Yes LLE Weight Bearing: Weight bearing as tolerated   Pertinent Vitals/Pain 5/10    Mobility  Bed Mobility Bed Mobility: Not assessed Transfers Transfers: Sit to Stand;Stand to Sit Sit to Stand: 5: Supervision;From chair/3-in-1;With armrests Stand to Sit: 5: Supervision;To chair/3-in-1;With armrests Ambulation/Gait Ambulation/Gait Assistance: 5: Supervision Ambulation Distance (Feet): 75 Feet Assistive device: Rolling walker Gait Pattern: Step-to pattern;Decreased stride length;Antalgic Gait velocity: decreased    Exercises Total Joint Exercises Ankle Circles/Pumps: AROM;Both;20 reps Quad Sets: AROM;Strengthening;Left;20 reps Heel Slides: AROM;Strengthening;Left;20 reps Hip ABduction/ADduction: AROM;Strengthening;Left;20 reps Long Arc Quad: AROM;Strengthening;Left;20 reps Knee Flexion: AROM;Strengthening;Left;20 reps Marching in Standing: AROM;Strengthening;Left;20 reps    PT Goals    Visit Information  Last PT Received On: 05/20/12 Assistance Needed: +1    Subjective Data  Subjective: i'm a little sore today Patient Stated Goal: to go home   Cognition  Overall Cognitive Status: Appears within functional limits for tasks assessed/performed Arousal/Alertness: Awake/alert Orientation Level: Oriented X4 / Intact;Appears intact for tasks assessed Behavior  During Session: [facility] for tasks performed    Balance     End of Session PT - End of Session Equipment Utilized During Treatment: Gait belt;Left knee immobilizer Activity Tolerance: Patient tolerated treatment well Patient left: in chair;with call bell/phone within reach Nurse Communication: Mobility status   GP     Fabio Asa 05/20/2012, 8:44 AM Charlotte Crumb, PT DPT  [phone]

## 2012-05-20 NOTE — Progress Notes (Signed)
ANTICOAGULATION CONSULT NOTE - Follow-up  Pharmacy Consult for Warfarin Indication: VTE px s/p L-TKA on 9/4  No Known Allergies  Patient Measurements: Height: 5\' 7"  (170.2 cm) Weight: 288 lb 2.3 oz (130.7 kg) IBW/kg (Calculated) : 66.1   Vital Signs: Temp: 98.4 F (36.9 C) (09/06 0650) BP: 130/77 mmHg (09/06 0650) Pulse Rate: 56  (09/06 0650)  Labs:  Basename 05/20/12 0655 05/19/12 0640  HGB 13.5 13.7  HCT 38.9* 39.8  PLT 161 177  APTT -- --  LABPROT 17.7* 14.2  INR 1.43 1.08  HEPARINUNFRC -- --  CREATININE -- 1.02  CKTOTAL -- --  CKMB -- --  TROPONINI -- --    Estimated Creatinine Clearance: 106.4 ml/min (by C-G formula based on Cr of 1.02).   Medical History: Past Medical History  Diagnosis Date  . Seasonal allergies   . Essential hypertension, benign   . Obesity   . Arthritis of knee     Bilateral  . ED (erectile dysfunction)   . Hemorrhoids, internal   . H/O: whooping cough     aschild  . Diabetes mellitus     type 2 NIDDM x 7 yrs; off meds  . Sleep apnea     wears CPAP nightly  x 6 years    Assessment: 56 y.o. M admitted for planned L-TKA on 9/4. The patient is now post-op and pharmacy has been consulted to dose warfarin for VTE prophylaxis. Warfarin points >8, baseline INR 1, baseline Hgb/Hct 14.8/42.4. INR today 1.43  Goal of Therapy:  INR 2-3   Plan:  1. Warfarin 7.5 mg x 1 dose at 1800 today 2. Daily PT/INR 3. Rec 6mg  daily as oupt.    05/20/2012 9:27 AM   Thanks for allowing pharmacy to be a part of this patient's care.  Talbert Cage, PharmD Clinical Pharmacist, [phone]

## 2012-05-20 NOTE — Progress Notes (Signed)
D/C instructions reviewed with patient and wife. RX x 1 given. RX x3 called into pharmacy by PA. hh equipment at home already. hh services arranged and confirmed with [facility]. All questions answered. Pt d/c'ed via wheelchair in stable condition

## 2012-05-20 NOTE — Progress Notes (Signed)
Physical Therapy Treatment Patient Details Name: Christopher Lambert MRN: [id number] DOB: 01-03-1956 Today's Date: 05/20/2012 Time: 4098-1191 PT Time Calculation (min): 27 min  PT Assessment / Plan / Recommendation Comments on Treatment Session  Pt tolerated ambulation well and was able to perform car transfer (x3) with no difficulties. Addressed all concerns related to mobility at discharge. Rec d/c home with Home PT.                    Plan Discharge plan remains appropriate    Precautions / Restrictions Precautions Precautions: Knee Restrictions Weight Bearing Restrictions: Yes LLE Weight Bearing: Weight bearing as tolerated   Pertinent Vitals/Pain 4/10    Mobility  Bed Mobility Bed Mobility: Not assessed Transfers Transfers: Sit to Stand;Stand to Sit Sit to Stand: 5: Supervision;From chair/3-in-1;With armrests Stand to Sit: 5: Supervision;To chair/3-in-1;With armrests Ambulation/Gait Ambulation/Gait Assistance: 5: Supervision Ambulation Distance (Feet): 260 Feet Assistive device: Rolling walker Gait Pattern: Step-to pattern;Decreased stride length;Antalgic Gait velocity: decreased      PT Goals    Visit Information  Last PT Received On: 05/20/12 Assistance Needed: +1    Subjective Data  Subjective: I'm doing well and almost ready to get out of here Patient Stated Goal: to go home   Cognition  Overall Cognitive Status: Appears within functional limits for tasks assessed/performed Arousal/Alertness: Awake/alert Orientation Level: Oriented X4 / Intact;Appears intact for tasks assessed Behavior During Session: [facility] for tasks performed    Balance     End of Session PT - End of Session Equipment Utilized During Treatment: Gait belt Activity Tolerance: Patient tolerated treatment well Patient left: in chair;with call bell/phone within reach;with family/visitor present Nurse Communication: Mobility status CPM Left Knee CPM Left Knee: Off   GP     Fabio Asa 05/20/2012, 12:46 PM Charlotte Crumb, PT DPT  [phone]

## 2012-05-27 ENCOUNTER — Other Ambulatory Visit: Payer: Self-pay | Admitting: Family Medicine

## 2012-05-28 LAB — BASIC METABOLIC PANEL
CO2: 31 mEq/L (ref 19–32)
Calcium: 9.1 mg/dL (ref 8.4–10.5)
Chloride: 98 mEq/L (ref 96–112)
Creat: 1.06 mg/dL (ref 0.50–1.35)
Sodium: 138 mEq/L (ref 135–145)

## 2012-05-28 LAB — HEMOGLOBIN A1C: Mean Plasma Glucose: 146 mg/dL — ABNORMAL HIGH (ref <117)

## 2012-05-30 ENCOUNTER — Ambulatory Visit (INDEPENDENT_AMBULATORY_CARE_PROVIDER_SITE_OTHER): Payer: BC Managed Care – PPO | Admitting: Family Medicine

## 2012-05-30 ENCOUNTER — Encounter: Payer: Self-pay | Admitting: Family Medicine

## 2012-05-30 VITALS — BP 148/84 | HR 82 | Resp 18 | Ht 67.5 in | Wt 278.0 lb

## 2012-05-30 DIAGNOSIS — Z23 Encounter for immunization: Secondary | ICD-10-CM

## 2012-05-30 DIAGNOSIS — E669 Obesity, unspecified: Secondary | ICD-10-CM

## 2012-05-30 DIAGNOSIS — Z13220 Encounter for screening for lipoid disorders: Secondary | ICD-10-CM

## 2012-05-30 DIAGNOSIS — I10 Essential (primary) hypertension: Secondary | ICD-10-CM

## 2012-05-30 DIAGNOSIS — Z1329 Encounter for screening for other suspected endocrine disorder: Secondary | ICD-10-CM

## 2012-05-30 DIAGNOSIS — R7309 Other abnormal glucose: Secondary | ICD-10-CM

## 2012-05-30 NOTE — Assessment & Plan Note (Signed)
Controlled, no change in medication DASH diet and commitment to daily physical activity for a minimum of 30 minutes discussed and encouraged, as a part of hypertension management. The importance of attaining a healthy weight is also discussed.  

## 2012-05-30 NOTE — Assessment & Plan Note (Signed)
Deteroioration in hBA1C, no regular activity at this time. Importance of dietary change and weight loss stressed

## 2012-05-30 NOTE — Progress Notes (Signed)
  Subjective:    Patient ID: Christopher Lambert, male    DOB: 1956-03-19, 56 y.o.   MRN: [id number]  HPI  The PT is here for follow up and re-evaluation of chronic medical conditions, medication management and review of any available recent lab and radiology data.  Preventive health is updated, specifically  Cancer screening and Immunization.   Questions or concerns regarding consultations or procedures which the PT has had in the interim are  Addressed.Had left knee replacement 09/4/201, doing well in home Pt, anxious for staples to be removed. Expects to be out for 6 month The PT denies any adverse reactions to current medications since the last visit.  There are no new concerns.  There are no specific complaints excepts discomfort of left knee Has worked hard on dietary change with excellent weight loss    Review of Systems See HPI Denies recent fever or chills. Denies sinus pressure, nasal congestion, ear pain or sore throat. Denies chest congestion, productive cough or wheezing. Denies chest pains, palpitations and leg swelling Denies abdominal pain, nausea, vomiting,diarrhea or constipation.   Denies dysuria, frequency, hesitancy or incontinence.  Denies headaches, seizures, numbness, or tingling. Denies depression, anxiety or insomnia. Denies skin break down or rash.        Objective:   Physical Exam  Patient alert and oriented and in no cardiopulmonary distress.  HEENT: No facial asymmetry, EOMI, no sinus tenderness,  oropharynx pink and moist.  Neck supple no adenopathy.  Chest: Clear to auscultation bilaterally.  CVS: S1, S2 no murmurs, no S3.  ABD: Soft non tender. Bowel sounds normal.  Ext: No edema  MS: Adequate ROM spine, shoulders, hips and reduced in left  knee.  Skin: Intact, no ulcerations or rash noted.  Psych: Good eye contact, normal affect. Memory intact not anxious or depressed appearing.  CNS: CN 2-12 intact, power, tone and sensation normal  throughout.       Assessment & Plan:

## 2012-05-30 NOTE — Patient Instructions (Addendum)
F/u end January, call if you need me before  hBA1c and chem 7, fasting lipid and tSH in January before visit.  I am happy knee replacement, left, went well  Congrats on weight loss, keep healthy healthy eating habits up  Flu vaccine today

## 2012-05-30 NOTE — Assessment & Plan Note (Signed)
Improved. Pt applauded on succesful weight loss through lifestyle change, and encouraged to continue same. Weight loss goal set for the next several months.  

## 2012-05-31 NOTE — Discharge Summary (Signed)
  ABBREVIATED DISCHARGE SUMMARY      DATE OF HOSPITALIZATION:  4 sept 2013  REASON FOR HOSPITALIZATION:  56yo bm with hx end stage djd left knee and pain.  Failed conservative treatment.      SIGNIFICANT FINDINGS:  DJD  OPERATION:  Left total knee replacement  FINAL DIAGNOSIS:  same  SECONDARY DIAGNOSIS: none  CONSULTANTS:  none  DISCHARGE CONDITION:  STABLE  DISCHARGED TO:  HOME

## 2012-07-07 ENCOUNTER — Other Ambulatory Visit: Payer: Self-pay | Admitting: Family Medicine

## 2012-08-17 ENCOUNTER — Other Ambulatory Visit: Payer: Self-pay | Admitting: Family Medicine

## 2012-10-06 ENCOUNTER — Other Ambulatory Visit: Payer: Self-pay | Admitting: Family Medicine

## 2012-10-06 LAB — HEMOGLOBIN A1C: Hgb A1c MFr Bld: 6.4 % — ABNORMAL HIGH (ref <5.7)

## 2012-10-07 LAB — LIPID PANEL
Cholesterol: 157 mg/dL (ref 0–200)
HDL: 40 mg/dL (ref >39)
Total CHOL/HDL Ratio: 3.9 Ratio
VLDL: 13 mg/dL (ref 0–40)

## 2012-10-07 LAB — BASIC METABOLIC PANEL
Calcium: 9.2 mg/dL (ref 8.4–10.5)
Glucose, Bld: 117 mg/dL — ABNORMAL HIGH (ref 70–99)
Potassium: 3.9 mEq/L (ref 3.5–5.3)
Sodium: 141 mEq/L (ref 135–145)

## 2012-10-19 ENCOUNTER — Ambulatory Visit (INDEPENDENT_AMBULATORY_CARE_PROVIDER_SITE_OTHER): Payer: BC Managed Care – PPO | Admitting: Family Medicine

## 2012-10-19 ENCOUNTER — Encounter: Payer: Self-pay | Admitting: Family Medicine

## 2012-10-19 VITALS — BP 130/80 | HR 78 | Resp 16 | Ht 67.5 in | Wt 300.8 lb

## 2012-10-19 DIAGNOSIS — J309 Allergic rhinitis, unspecified: Secondary | ICD-10-CM

## 2012-10-19 DIAGNOSIS — I10 Essential (primary) hypertension: Secondary | ICD-10-CM

## 2012-10-19 DIAGNOSIS — Z125 Encounter for screening for malignant neoplasm of prostate: Secondary | ICD-10-CM

## 2012-10-19 DIAGNOSIS — M109 Gout, unspecified: Secondary | ICD-10-CM

## 2012-10-19 DIAGNOSIS — R7309 Other abnormal glucose: Secondary | ICD-10-CM

## 2012-10-19 DIAGNOSIS — G4733 Obstructive sleep apnea (adult) (pediatric): Secondary | ICD-10-CM

## 2012-10-19 DIAGNOSIS — E669 Obesity, unspecified: Secondary | ICD-10-CM

## 2012-10-19 DIAGNOSIS — E8881 Metabolic syndrome: Secondary | ICD-10-CM

## 2012-10-19 MED ORDER — ASPIRIN EC 81 MG PO TBEC: 81.0000 mg | DELAYED_RELEASE_TABLET | Freq: Every day | ORAL | Status: AC

## 2012-10-19 NOTE — Patient Instructions (Addendum)
F/u in 6 month, call if you need me before  Weight loss goal of 24 pounds   All the best for this year.  HBA1C, chem 7, uric acid , PSA  Pls start aspirin 81mg  once daily top reduce risk of heart disease

## 2012-10-28 NOTE — Assessment & Plan Note (Signed)
Improved HBA1C despite weight gain Patient educated about the importance of limiting  Carbohydrate intake , the need to commit to daily physical activity for a minimum of 30 minutes , and to commit weight loss. The fact that changes in all these areas will reduce or eliminate all together the development of diabetes is stressed.

## 2012-10-28 NOTE — Assessment & Plan Note (Signed)
Deteriorated. Patient re-educated about  the importance of commitment to a  minimum of 150 minutes of exercise per week. The importance of healthy food choices with portion control discussed. Encouraged to start a food diary, count calories and to consider  joining a support group. Sample diet sheets offered. Goals set by the patient for the next several months.    

## 2012-10-28 NOTE — Assessment & Plan Note (Signed)
Controlled, no change in medication  

## 2012-10-28 NOTE — Assessment & Plan Note (Signed)
Importance of compliance with CPAP stressed, ot remains compliant by his reporting

## 2012-10-28 NOTE — Progress Notes (Signed)
  Subjective:    Patient ID: Christopher Lambert, male    DOB: 11-14-1955, 57 y.o.   MRN: [id number]  HPI The PT is here for follow up and re-evaluation of chronic medical conditions, medication management and review of any available recent lab and radiology data.  Preventive health is updated, specifically  Cancer screening and Immunization.   Had successful modified left knee replacement,sept 2013, still out of work and uncertain as to whether he will be able to return to his previous job. He is stressed about this as income will fall, affecting his sleep, but he does believe that things will work out He has been over eating and is not active and has gained a significant mount of weight     Review of Systems See HPI Denies recent fever or chills. Denies sinus pressure, nasal congestion, ear pain or sore throat. Denies chest congestion, productive cough or wheezing. Denies chest pains, palpitations and leg swelling Denies abdominal pain, nausea, vomiting,diarrhea or constipation.   Denies dysuria, frequency, hesitancy or incontinence. Reduced ROM knees Denies headaches, seizures, numbness, or tingling. Denies skin break down or rash.        Objective:   Physical Exam  Patient alert and oriented and in no cardiopulmonary distress.  HEENT: No facial asymmetry, EOMI, no sinus tenderness,  oropharynx pink and moist.  Neck supple no adenopathy.  Chest: Clear to auscultation bilaterally.  CVS: S1, S2 no murmurs, no S3.  ABD: Soft non tender. Bowel sounds normal.  Ext: No edema  MS: Adequate ROM spine, shoulders, hips and reduced in  knees.  Skin: Intact, no ulcerations or rash noted.  Psych: Good eye contact, normal affect. Memory intact not anxious or depressed appearing.  CNS: CN 2-12 intact, power, tone and sensation normal throughout.       Assessment & Plan:

## 2012-10-28 NOTE — Assessment & Plan Note (Signed)
Controlled, no change in medication DASH diet and commitment to daily physical activity for a minimum of 30 minutes discussed and encouraged, as a part of hypertension management. The importance of attaining a healthy weight is also discussed.  

## 2012-11-19 ENCOUNTER — Other Ambulatory Visit: Payer: Self-pay | Admitting: Family Medicine

## 2012-12-05 ENCOUNTER — Other Ambulatory Visit: Payer: Self-pay | Admitting: Family Medicine

## 2013-01-09 ENCOUNTER — Other Ambulatory Visit: Payer: Self-pay | Admitting: Family Medicine

## 2013-02-22 ENCOUNTER — Ambulatory Visit: Payer: BC Managed Care – PPO | Admitting: Internal Medicine

## 2013-02-24 ENCOUNTER — Ambulatory Visit: Payer: BC Managed Care – PPO | Admitting: Internal Medicine

## 2013-03-07 ENCOUNTER — Other Ambulatory Visit: Payer: Self-pay

## 2013-03-07 MED ORDER — FLUTICASONE PROPIONATE 50 MCG/ACT NA SUSP: 2.0000 | Freq: Every day | NASAL | Status: DC

## 2013-03-16 ENCOUNTER — Encounter: Payer: Self-pay | Admitting: Internal Medicine

## 2013-03-16 ENCOUNTER — Ambulatory Visit (INDEPENDENT_AMBULATORY_CARE_PROVIDER_SITE_OTHER): Payer: BC Managed Care – PPO | Admitting: Internal Medicine

## 2013-03-16 VITALS — BP 120/80 | HR 71 | Ht 67.5 in | Wt 311.8 lb

## 2013-03-16 DIAGNOSIS — G4733 Obstructive sleep apnea (adult) (pediatric): Secondary | ICD-10-CM

## 2013-03-16 NOTE — Patient Instructions (Addendum)
Order- DME Christopher Lambert  CPAP reduce pressure to 14 cwp    Also, needs replacement straps and supplies      Dx OSA

## 2013-03-16 NOTE — Progress Notes (Signed)
Patient ID: Christopher Lambert, male    DOB: [birth year], 56 y.o.   MRN: [id number]  HPI 02/13/11- OSA, rhinitis Last here August 20, 2010. Note reviewed. Continues CPAP 16, all night every night, all night. Still occasional yawn and nod. Tried Provigil for residual sleepiness but it caused tachy palpitation. He has not been a coffee drinker.  Mild nasal congestion early Spring, managed otc, but ok now.   08/19/11- 55 yoM never smoker followed for OSA, rhinitis, PAF PCP Dr Syliva Overman Has had flu vaccine. 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 Continues CPAP at 16 CWP with good compliance and control. Still experiences daytime sleepiness if quiet and will get himself caffeine to help. Has had intervals of sustained tachycardia palpitation at least twice. He describes a sudden onset without pain or stress, gradually easing off. He carries a diagnosis of atrial fib on his problem list.  02/23/12- 55 yoM never smoker followed for OSA, rhinitis,     PCP Dr Judie Petit. [name] Wears CPAP every night for approx 5-6 hours; pressure  working well; was checked by Cardiology [facility]) and given okay on heart. Good with CPAP 16/ [name]. Waiting arrival for new mask. Some dry mouth  03/16/13- 57 yoM never smoker followed for OSA, rhinitis,     PCP Dr Judie Petit. [name] Yearly Follow up.  Wearing cpap16/ [name]  every night for approx 5-6 hours.  Wakes up with dry mouth in the mornings.  Head strap is loosing up.  Dry mouth despite Biotene and fullface mask. Hx bilateral TKR- disabled  CXR 05/18/12 IMPRESSION:  No acute abnormality.  Original Report Authenticated By: Fuller Canada, M.D.  ROS-see HPI Constitutional:   No-   weight loss, night sweats, fevers, chills, fatigue, lassitude. HEENT:   No-  headaches, difficulty swallowing, tooth/dental problems, sore throat,       No-  sneezing, itching, ear ache, nasal congestion, post nasal drip,  CV:  No-   chest pain, orthopnea, PND, swelling in lower extremities,  anasarca, dizziness, palpitations Resp: No-   shortness of breath with exertion or at rest.              No-   productive cough,  No non-productive cough,  No- coughing up of blood.              No-   change in color of mucus.  No- wheezing.   Skin: No-   rash or lesions. GI:  No-   heartburn, indigestion, abdominal pain, nausea, vomiting, GU:  MS:  No-   joint pain or swelling.   Neuro-     nothing unusual Psych:  No- change in mood or affect. No depression or anxiety.  No memory loss.  Objective:   Physical Exam General- Alert, Oriented, Affect-appropriate, Distress- none acute, obese Skin- rash-none, lesions- none, excoriation- none Lymphadenopathy- none Head- atraumatic            Eyes- Gross vision intact, PERRLA, conjunctivae clear secretions            Ears- Hearing, canals-normal            Nose- Clear, no-Septal dev, mucus, polyps, erosion, perforation             Throat- Mallampati IV , mucosa clear and it seems normally moist, drainage- none, tonsils- atrophic Neck- flexible , trachea midline, no stridor , thyroid nl, carotid no bruit Chest - symmetrical excursion , unlabored  Heart/CV- RRR , no murmur , no gallop  , no rub, nl s1 s2                           - JVD- none , edema4+, stasis changes-+, varices- none           Lung- clear to P&A, wheeze- none, cough- none , dullness-none, rub- none           Chest wall-  Abd-  Br/ Gen/ Rectal- Not done, not indicated Extrem- cyanosis- none, clubbing, none, atrophy- none, strength- nl Neuro- grossly intact to observation

## 2013-03-28 ENCOUNTER — Telehealth: Payer: Self-pay | Admitting: Internal Medicine

## 2013-03-28 NOTE — Telephone Encounter (Signed)
Please advise PCC's thanks  EPIC shows order was sent.

## 2013-03-29 NOTE — Telephone Encounter (Signed)
Unable to reach pt printed and refaxed order to [facility]

## 2013-04-02 NOTE — Assessment & Plan Note (Signed)
Adequate compliance and the pressure seems appropriate. Because of his complaints of dry mouth we will try reducing pressure to 14 to reduce airflow.

## 2013-04-10 ENCOUNTER — Other Ambulatory Visit: Payer: Self-pay | Admitting: Family Medicine

## 2013-04-14 LAB — BASIC METABOLIC PANEL
CO2: 28 mEq/L (ref 19–32)
Calcium: 8.7 mg/dL (ref 8.4–10.5)
Chloride: 102 mEq/L (ref 96–112)
Glucose, Bld: 122 mg/dL — ABNORMAL HIGH (ref 70–99)
Potassium: 3.4 mEq/L — ABNORMAL LOW (ref 3.5–5.3)
Sodium: 137 mEq/L (ref 135–145)

## 2013-04-14 LAB — PSA: PSA: 3.97 ng/mL (ref <=4.00)

## 2013-04-14 LAB — HEMOGLOBIN A1C: Mean Plasma Glucose: 131 mg/dL — ABNORMAL HIGH (ref <117)

## 2013-04-17 ENCOUNTER — Encounter: Payer: Self-pay | Admitting: Family Medicine

## 2013-04-17 ENCOUNTER — Ambulatory Visit (INDEPENDENT_AMBULATORY_CARE_PROVIDER_SITE_OTHER): Payer: BC Managed Care – PPO | Admitting: Family Medicine

## 2013-04-17 VITALS — BP 130/70 | HR 60 | Resp 18 | Ht 67.5 in | Wt 308.0 lb

## 2013-04-17 DIAGNOSIS — I10 Essential (primary) hypertension: Secondary | ICD-10-CM

## 2013-04-17 DIAGNOSIS — Z1211 Encounter for screening for malignant neoplasm of colon: Secondary | ICD-10-CM

## 2013-04-17 DIAGNOSIS — E669 Obesity, unspecified: Secondary | ICD-10-CM

## 2013-04-17 DIAGNOSIS — Z1212 Encounter for screening for malignant neoplasm of rectum: Secondary | ICD-10-CM

## 2013-04-17 DIAGNOSIS — M129 Arthropathy, unspecified: Secondary | ICD-10-CM

## 2013-04-17 DIAGNOSIS — J309 Allergic rhinitis, unspecified: Secondary | ICD-10-CM

## 2013-04-17 DIAGNOSIS — G4733 Obstructive sleep apnea (adult) (pediatric): Secondary | ICD-10-CM

## 2013-04-17 DIAGNOSIS — R7309 Other abnormal glucose: Secondary | ICD-10-CM

## 2013-04-17 LAB — POC HEMOCCULT BLD/STL (OFFICE/1-CARD/DIAGNOSTIC): Fecal Occult Blood, POC: NEGATIVE

## 2013-04-17 NOTE — Progress Notes (Signed)
  Subjective:    Patient ID: Christopher Lambert, male    DOB: 01/18/1956, 57 y.o.   MRN: [id number]  HPI The PT is here for follow up and re-evaluation of chronic medical conditions, medication management and review of any available recent lab and radiology data.  Preventive health is updated, specifically  Cancer screening and Immunization.   Out of work permanently due to bilateral knee re[placements, figuring out what to do to keep himself busy, but enjoying being home with his wife. The PT denies any adverse reactions to current medications since the last visit.  There are no new concerns except excessive weight gain which he attributes to reduced activity as well as to home environment all day  There are no specific complaints       Review of Systems See HPI Denies recent fever or chills. Denies sinus pressure, nasal congestion, ear pain or sore throat. Denies chest congestion, productive cough or wheezing. Denies chest pains, palpitations and leg swelling Denies abdominal pain, nausea, vomiting,diarrhea or constipation.   Denies dysuria, frequency, hesitancy or incontinence. Denies joint pain,  He does have swelling and limitation in mobility of right knee in particular Denies headaches, seizures, numbness, or tingling. Denies depression, anxiety or insomnia. Denies skin break down or rash.        Objective:   Physical Exam  Patient alert and oriented and in no cardiopulmonary distress.  HEENT: No facial asymmetry, EOMI, no sinus tenderness,  oropharynx pink and moist.  Neck supple no adenopathy.  Chest: Clear to auscultation bilaterally.  CVS: S1, S2 no murmurs, no S3.  ABD: Soft non tender. Bowel sounds normal. Rectal: no mass, prostate smooth, not enlarged . Heme negative stool Ext: No edema  MS: Adequate ROM spine, shoulders, hips and reduced in  knees.  Skin: Intact, no ulcerations or rash noted.  Psych: Good eye contact, normal affect. Memory intact not  anxious or depressed appearing.  CNS: CN 2-12 intact, power, tone and sensation normal throughout.       Assessment & Plan:

## 2013-04-17 NOTE — Patient Instructions (Addendum)
F/u in January, after 25th, call if you need me before  Call in October for your flu vaccine  Blood pressure is good.   Please  Work on changing eating habits so that you lose some weight. You have gained 30 pounds   PLAN on a 10 pound weigh loss by January  STOP meloxicam, instead use tylenol as needed for arthritis pain  Fasting lipid, chem 7, TSH, HBA1C sand CBC January 23 or after   Rectal; today

## 2013-04-18 ENCOUNTER — Ambulatory Visit: Payer: BC Managed Care – PPO | Admitting: Family Medicine

## 2013-04-29 NOTE — Assessment & Plan Note (Signed)
Controlled, no change in medication DASH diet and commitment to daily physical activity for a minimum of 30 minutes discussed and encouraged, as a part of hypertension management. The importance of attaining a healthy weight is also discussed.  

## 2013-04-29 NOTE — Assessment & Plan Note (Signed)
Improved, despite weight gain Patient educated about the importance of limiting  Carbohydrate intake , the need to commit to daily physical activity for a minimum of 30 minutes , and to commit weight loss. The fact that changes in all these areas will reduce or eliminate all together the development of diabetes is stressed.

## 2013-04-29 NOTE — Assessment & Plan Note (Signed)
Deteriorated. Patient re-educated about  the importance of commitment to a  minimum of 150 minutes of exercise per week. The importance of healthy food choices with portion control discussed. Encouraged to start a food diary, count calories and to consider  joining a support group. Sample diet sheets offered. Goals set by the patient for the next several months.    

## 2013-04-29 NOTE — Assessment & Plan Note (Signed)
Compliance with CPAP use is stressed

## 2013-04-29 NOTE — Assessment & Plan Note (Signed)
Controlled, no change in medication  

## 2013-05-04 ENCOUNTER — Other Ambulatory Visit: Payer: Self-pay

## 2013-05-04 MED ORDER — NIFEDIPINE ER OSMOTIC RELEASE 90 MG PO TB24: ORAL_TABLET | ORAL | Status: DC

## 2013-05-04 MED ORDER — BENAZEPRIL-HYDROCHLOROTHIAZIDE 20-25 MG PO TABS: ORAL_TABLET | ORAL | Status: DC

## 2013-06-23 ENCOUNTER — Ambulatory Visit (INDEPENDENT_AMBULATORY_CARE_PROVIDER_SITE_OTHER): Payer: BC Managed Care – PPO

## 2013-06-23 ENCOUNTER — Encounter (INDEPENDENT_AMBULATORY_CARE_PROVIDER_SITE_OTHER): Payer: Self-pay

## 2013-06-23 DIAGNOSIS — Z23 Encounter for immunization: Secondary | ICD-10-CM

## 2013-10-06 LAB — BASIC METABOLIC PANEL
BUN: 15 mg/dL (ref 6–23)
CO2: 29 mEq/L (ref 19–32)
Calcium: 8.9 mg/dL (ref 8.4–10.5)
Chloride: 101 mEq/L (ref 96–112)
Creat: 1.05 mg/dL (ref 0.50–1.35)
Glucose, Bld: 116 mg/dL — ABNORMAL HIGH (ref 70–99)
Potassium: 3.6 mEq/L (ref 3.5–5.3)
Sodium: 140 mEq/L (ref 135–145)

## 2013-10-06 LAB — CBC
HCT: 45.2 % (ref 39.0–52.0)
Hemoglobin: 15 g/dL (ref 13.0–17.0)
MCH: 30 pg (ref 26.0–34.0)
MCHC: 33.2 g/dL (ref 30.0–36.0)
MCV: 90.4 fL (ref 78.0–100.0)
Platelets: 226 10*3/uL (ref 150–400)
RBC: 5 MIL/uL (ref 4.22–5.81)
RDW: 14 % (ref 11.5–15.5)
WBC: 3.8 10*3/uL — ABNORMAL LOW (ref 4.0–10.5)

## 2013-10-06 LAB — LIPID PANEL
Cholesterol: 167 mg/dL (ref 0–200)
HDL: 37 mg/dL — ABNORMAL LOW (ref >39)
LDL Cholesterol: 114 mg/dL — ABNORMAL HIGH (ref 0–99)
Total CHOL/HDL Ratio: 4.5 Ratio
Triglycerides: 80 mg/dL (ref <150)
VLDL: 16 mg/dL (ref 0–40)

## 2013-10-06 LAB — HEMOGLOBIN A1C
Hgb A1c MFr Bld: 6.4 % — ABNORMAL HIGH (ref <5.7)
Mean Plasma Glucose: 137 mg/dL — ABNORMAL HIGH (ref <117)

## 2013-10-06 LAB — TSH: TSH: 1.495 u[IU]/mL (ref 0.350–4.500)

## 2013-10-09 ENCOUNTER — Ambulatory Visit (INDEPENDENT_AMBULATORY_CARE_PROVIDER_SITE_OTHER): Payer: BC Managed Care – PPO | Admitting: Family Medicine

## 2013-10-09 ENCOUNTER — Encounter (INDEPENDENT_AMBULATORY_CARE_PROVIDER_SITE_OTHER): Payer: Self-pay

## 2013-10-09 ENCOUNTER — Encounter: Payer: Self-pay | Admitting: Family Medicine

## 2013-10-09 VITALS — BP 130/74 | HR 70 | Resp 18 | Ht 67.5 in | Wt 301.1 lb

## 2013-10-09 DIAGNOSIS — E669 Obesity, unspecified: Secondary | ICD-10-CM

## 2013-10-09 DIAGNOSIS — E8881 Metabolic syndrome: Secondary | ICD-10-CM

## 2013-10-09 DIAGNOSIS — E785 Hyperlipidemia, unspecified: Secondary | ICD-10-CM

## 2013-10-09 DIAGNOSIS — E119 Type 2 diabetes mellitus without complications: Secondary | ICD-10-CM

## 2013-10-09 DIAGNOSIS — I10 Essential (primary) hypertension: Secondary | ICD-10-CM

## 2013-10-09 DIAGNOSIS — G4733 Obstructive sleep apnea (adult) (pediatric): Secondary | ICD-10-CM

## 2013-10-09 DIAGNOSIS — E1169 Type 2 diabetes mellitus with other specified complication: Secondary | ICD-10-CM

## 2013-10-09 MED ORDER — METFORMIN HCL 500 MG PO TABS: 500.0000 mg | ORAL_TABLET | Freq: Every day | ORAL | Status: DC

## 2013-10-09 MED ORDER — ATORVASTATIN CALCIUM 10 MG PO TABS: 10.0000 mg | ORAL_TABLET | Freq: Every day | ORAL | Status: DC

## 2013-10-09 NOTE — Patient Instructions (Addendum)
F/u in  4  Month, please call if you need me before  Microalb from office today    New additional medication for cholesterol is lipitor  And metformin for your blood sugar continue BP med as before  Please consider exercise twice daily for 30 mins each session and cutting back on food portion size, also increased vegetable in diet so that you are able to lose weight  Congrats on weight loss , keep it up  Fasting l;ipid, cmp and EGFR, and HBA1C in 4 month, before next viisit  All the best for 2015!

## 2013-10-09 NOTE — Progress Notes (Signed)
   Subjective:    Patient ID: Christopher Lambert, male    DOB: 1956-01-13, 58 y.o.   MRN: [id number]  HPI The PT is here for follow up and re-evaluation of chronic medical conditions, medication management and review of any available recent lab and radiology data.  Preventive health is updated, specifically  Cancer screening and Immunization.   Questions or concerns regarding consultations or procedures which the PT has had in the interim are  addressed. The PT denies any adverse reactions to current medications since the last visit.  There are no new concerns.  There are no specific complaints       Review of Systems See HPI Denies recent fever or chills. Denies sinus pressure, nasal congestion, ear pain or sore throat. Denies chest congestion, productive cough or wheezing. Denies chest pains, palpitations and leg swelling Denies abdominal pain, nausea, vomiting,diarrhea or constipation.   Denies dysuria, frequency, hesitancy or incontinence. Denies joint pain, swelling and limitation in mobility. Denies headaches, seizures, numbness, or tingling. Denies depression, anxiety or insomnia. Denies skin break down or rash.        Objective:   Physical Exam Patient alert and oriented and in no cardiopulmonary distress.  HEENT: No facial asymmetry, EOMI, no sinus tenderness,  oropharynx pink and moist.  Neck supple no adenopathy.  Chest: Clear to auscultation bilaterally.  CVS: S1, S2 no murmurs, no S3.  ABD: Soft non tender. Bowel sounds normal.  Ext: No edema  MS: Adequate ROM spine, shoulders, hips and knees.  Skin: Intact, no ulcerations or rash noted.  Psych: Good eye contact, normal affect. Memory intact not anxious or depressed appearing.  CNS: CN 2-12 intact, power, tone and sensation normal throughout.        Assessment & Plan:

## 2013-10-11 LAB — MICROALBUMIN / CREATININE URINE RATIO
Creatinine, Urine: 107.9 mg/dL
MICROALB UR: 0.5 mg/dL (ref 0.00–1.89)
Microalb Creat Ratio: 4.6 mg/g (ref 0.0–30.0)

## 2013-10-15 DIAGNOSIS — E785 Hyperlipidemia, unspecified: Secondary | ICD-10-CM

## 2013-10-15 DIAGNOSIS — E1169 Type 2 diabetes mellitus with other specified complication: Secondary | ICD-10-CM | POA: Insufficient documentation

## 2013-10-15 NOTE — Assessment & Plan Note (Signed)
Deteriorated. Resume metformin  Patient advised to reduce carb and sweets, commit to regular physical activity, take meds as prescribed, test blood as directed, and attempt to lose weight, to improve blood sugar control.

## 2013-10-15 NOTE — Assessment & Plan Note (Signed)
The increased risk of cardiovascular disease associated with this diagnosis, and the need to consistently work on lifestyle to change this is discussed. Following  a  heart healthy diet ,commitment to 30 minutes of exercise at least 5 days per week, as well as control of blood sugar and cholesterol , and achieving a healthy weight are all the areas to be addressed .  

## 2013-10-15 NOTE — Assessment & Plan Note (Signed)
Ongoing use of CPAP, followed by pulmonary

## 2013-10-15 NOTE — Assessment & Plan Note (Signed)
Improved. Pt applauded on succesful weight loss through lifestyle change, and encouraged to continue same. Weight loss goal set for the next several months.  

## 2013-10-15 NOTE — Assessment & Plan Note (Signed)
Hyperlipidemia:Low fat diet discussed and encouraged.  Start statin  Increase exercise to improve HDL

## 2013-10-15 NOTE — Assessment & Plan Note (Signed)
Controlled, no change in medication DASH diet and commitment to daily physical activity for a minimum of 30 minutes discussed and encouraged, as a part of hypertension management. The importance of attaining a healthy weight is also discussed.  

## 2013-12-05 ENCOUNTER — Other Ambulatory Visit: Payer: Self-pay

## 2013-12-05 DIAGNOSIS — E119 Type 2 diabetes mellitus without complications: Secondary | ICD-10-CM

## 2013-12-05 MED ORDER — METFORMIN HCL 500 MG PO TABS: 500.0000 mg | ORAL_TABLET | Freq: Every day | ORAL | Status: DC

## 2013-12-05 MED ORDER — ATORVASTATIN CALCIUM 10 MG PO TABS: 10.0000 mg | ORAL_TABLET | Freq: Every day | ORAL | Status: DC

## 2013-12-11 ENCOUNTER — Other Ambulatory Visit: Payer: Self-pay | Admitting: Family Medicine

## 2013-12-31 ENCOUNTER — Other Ambulatory Visit: Payer: Self-pay | Admitting: Family Medicine

## 2014-02-01 LAB — COMPLETE METABOLIC PANEL WITH GFR
ALK PHOS: 37 U/L — AB (ref 39–117)
ALT: 18 U/L (ref 0–53)
AST: 16 U/L (ref 0–37)
Albumin: 3.9 g/dL (ref 3.5–5.2)
BUN: 12 mg/dL (ref 6–23)
CO2: 28 mEq/L (ref 19–32)
Calcium: 8.8 mg/dL (ref 8.4–10.5)
Chloride: 101 mEq/L (ref 96–112)
Creat: 0.99 mg/dL (ref 0.50–1.35)
GFR, Est African American: >89 mL/min
GFR, Est Non African American: 85 mL/min
Glucose, Bld: 110 mg/dL — ABNORMAL HIGH (ref 70–99)
POTASSIUM: 3.4 meq/L — AB (ref 3.5–5.3)
Sodium: 137 mEq/L (ref 135–145)
TOTAL PROTEIN: 6.8 g/dL (ref 6.0–8.3)
Total Bilirubin: 1.1 mg/dL (ref 0.2–1.2)

## 2014-02-01 LAB — LIPID PANEL
CHOL/HDL RATIO: 2.9 ratio
Cholesterol: 99 mg/dL (ref 0–200)
HDL: 34 mg/dL — ABNORMAL LOW (ref >39)
LDL Cholesterol: 47 mg/dL (ref 0–99)
Triglycerides: 90 mg/dL (ref <150)
VLDL: 18 mg/dL (ref 0–40)

## 2014-02-01 LAB — HEMOGLOBIN A1C
Hgb A1c MFr Bld: 6.5 % — ABNORMAL HIGH (ref <5.7)
Mean Plasma Glucose: 140 mg/dL — ABNORMAL HIGH (ref <117)

## 2014-02-06 ENCOUNTER — Encounter: Payer: Self-pay | Admitting: Family Medicine

## 2014-02-06 ENCOUNTER — Encounter (INDEPENDENT_AMBULATORY_CARE_PROVIDER_SITE_OTHER): Payer: Self-pay

## 2014-02-06 ENCOUNTER — Ambulatory Visit (INDEPENDENT_AMBULATORY_CARE_PROVIDER_SITE_OTHER): Payer: BC Managed Care – PPO | Admitting: Family Medicine

## 2014-02-06 VITALS — BP 120/80 | HR 80 | Resp 16 | Wt 297.0 lb

## 2014-02-06 DIAGNOSIS — Z96659 Presence of unspecified artificial knee joint: Secondary | ICD-10-CM

## 2014-02-06 DIAGNOSIS — E785 Hyperlipidemia, unspecified: Secondary | ICD-10-CM

## 2014-02-06 DIAGNOSIS — I10 Essential (primary) hypertension: Secondary | ICD-10-CM

## 2014-02-06 DIAGNOSIS — E1169 Type 2 diabetes mellitus with other specified complication: Secondary | ICD-10-CM

## 2014-02-06 DIAGNOSIS — G4733 Obstructive sleep apnea (adult) (pediatric): Secondary | ICD-10-CM

## 2014-02-06 DIAGNOSIS — Z125 Encounter for screening for malignant neoplasm of prostate: Secondary | ICD-10-CM

## 2014-02-06 DIAGNOSIS — M129 Arthropathy, unspecified: Secondary | ICD-10-CM

## 2014-02-06 DIAGNOSIS — E669 Obesity, unspecified: Secondary | ICD-10-CM

## 2014-02-06 DIAGNOSIS — E119 Type 2 diabetes mellitus without complications: Secondary | ICD-10-CM

## 2014-02-06 NOTE — Assessment & Plan Note (Signed)
Controlled, no change in medication DASH diet and commitment to daily physical activity for a minimum of 30 minutes discussed and encouraged, as a part of hypertension management. The importance of attaining a healthy weight is also discussed.  

## 2014-02-06 NOTE — Progress Notes (Signed)
   Subjective:    Patient ID: Christopher Lambert, male    DOB: 03/17/1956, 58 y.o.   MRN: [id number]  HPI The PT is here for follow up and re-evaluation of chronic medical conditions, medication management and review of any available recent lab and radiology data.  Preventive health is updated, specifically  Cancer screening and Immunization.   Questions or concerns regarding consultations or procedures which the PT has had in the interim are  addressed. The PT denies any adverse reactions to current medications since the last visit.  There are no new concerns.  There are no specific complaints , requests handicap form replacement       Review of Systems See HPI Denies recent fever or chills. Denies sinus pressure, nasal congestion, ear pain or sore throat. Denies chest congestion, productive cough or wheezing. Denies chest pains, palpitations and leg swelling Denies abdominal pain, nausea, vomiting,diarrhea or constipation.   Denies dysuria, frequency, hesitancy or incontinence. C/o limitation in mobility and inability to weight bear for prolonged periods following bilateral knee replacement Denies headaches, seizures, numbness, or tingling. Denies depression, anxiety or insomnia. Denies skin break down or rash.        Objective:   Physical Exam  BP 120/80  Pulse 80  Resp 16  Wt 297 lb (134.718 kg)  SpO2 97% Patient alert and oriented and in no cardiopulmonary distress.  HEENT: No facial asymmetry, EOMI, no sinus tenderness,  oropharynx pink and moist.  Neck supple no adenopathy.  Chest: Clear to auscultation bilaterally.  CVS: S1, S2 no murmurs, no S3.  ABD: Soft non tender. Bowel sounds normal.  Ext: No edema  MS: Adequate ROM spine, shoulders, hips and reduced in  knees.  Skin: Intact, no ulcerations or rash noted.  Psych: Good eye contact, normal affect. Memory intact not anxious or depressed appearing.  CNS: CN 2-12 intact, power, tone and sensation normal  throughout.       Assessment & Plan:  Essential hypertension, benign Controlled, no change in medication DASH diet and commitment to daily physical activity for a minimum of 30 minutes discussed and encouraged, as a part of hypertension management. The importance of attaining a healthy weight is also discussed.   Obstructive sleep apnea Reports daily use of CPAP, encouraged to maintain this  DM type 2 with diabetic dyslipidemia Increase in hBA1C, needs to reduce sugar and carb intake Patient advised to reduce carb and sweets, commit to regular physical activity, take meds as prescribed, test blood as directed, and attempt to lose weight, to improve blood sugar control. Needs eye exam and is aware  Reduce dose of lipitor to 5mg  daily, will split the 10mg  tab   OBESITY Improved. Pt applauded on succesful weight loss through lifestyle change, and encouraged to continue same. Weight loss goal set for the next several months.   ARTHRITIS, KNEES, BILATERAL Requests handicap form renewal, despite bilateral replacement , reports inabilityu to weight bear for prolonged periods without needing to rest, form provided

## 2014-02-06 NOTE — Assessment & Plan Note (Signed)
Requests handicap form renewal, despite bilateral replacement , reports inabilityu to weight bear for prolonged periods without needing to rest, form provided

## 2014-02-06 NOTE — Assessment & Plan Note (Addendum)
Increase in hBA1C, needs to reduce sugar and carb intake Patient advised to reduce carb and sweets, commit to regular physical activity, take meds as prescribed, test blood as directed, and attempt to lose weight, to improve blood sugar control. Needs eye exam and is aware  Reduce dose of lipitor to 5mg  daily, will split the 10mg  tab

## 2014-02-06 NOTE — Assessment & Plan Note (Signed)
Improved. Pt applauded on succesful weight loss through lifestyle change, and encouraged to continue same. Weight loss goal set for the next several months.  

## 2014-02-06 NOTE — Patient Instructions (Addendum)
F/u with rectal in October, call if you need me before  Congrats on weight loss.  Need to reduce sugar and carb intake , blood sugar has increased slightly  Cut lipitor 44m tab in half, dose is reduced   Handicap sticker today  Please try to get eye exam over the Summer  Foot exam today is good  HBA1C, chem 7 and EGFR , pSA in October before visit, non fasting

## 2014-02-06 NOTE — Assessment & Plan Note (Signed)
Reports daily use of CPAP, encouraged to maintain this

## 2014-02-12 ENCOUNTER — Other Ambulatory Visit: Payer: Self-pay | Admitting: Family Medicine

## 2014-03-19 ENCOUNTER — Encounter: Payer: Self-pay | Admitting: Internal Medicine

## 2014-03-19 ENCOUNTER — Ambulatory Visit (INDEPENDENT_AMBULATORY_CARE_PROVIDER_SITE_OTHER): Payer: BC Managed Care – PPO | Admitting: Internal Medicine

## 2014-03-19 VITALS — BP 128/86 | HR 64

## 2014-03-19 DIAGNOSIS — G4733 Obstructive sleep apnea (adult) (pediatric): Secondary | ICD-10-CM

## 2014-03-19 NOTE — Patient Instructions (Signed)
We can continue CPAP [name]. Ask them to check out your humidifier as we discussed.  Please call if we can help.

## 2014-03-19 NOTE — Progress Notes (Signed)
Patient ID: Christopher Lambert, male    DOB: 1956-08-05, 58 y.o.   MRN: [id number]  HPI 02/13/11- OSA, rhinitis Last here August 20, 2010. Note reviewed. Continues CPAP 16, all night every night, all night. Still occasional yawn and nod. Tried Provigil for residual sleepiness but it caused tachy palpitation. He has not been a coffee drinker.  Mild nasal congestion early Spring, managed otc, but ok now.   08/19/11- 65 yoM never smoker followed for OSA, rhinitis, PAF PCP Dr Tula Nakayama Has had flu vaccine. 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 Continues CPAP at [facility] with good compliance and control. Still experiences daytime sleepiness if quiet and will get himself caffeine to help. Has had intervals of sustained tachycardia palpitation at least twice. He describes a sudden onset without pain or stress, gradually easing off. He carries a diagnosis of atrial fib on his problem list.  02/23/12- 55 yoM never smoker followed for OSA, rhinitis,     PCP Dr Jerilynn Mages. [name] Wears CPAP every night for approx 5-6 hours; pressure  working well; was checked by [facility]) and given okay on heart. Good with CPAP 16/ [facility]. Waiting arrival for new mask. Some dry mouth  03/16/13- 57 yoM never smoker followed for OSA, rhinitis,     PCP Dr Jerilynn Mages. [name] Yearly Follow up.  Wearing cpap16/ [name]  every night for approx 5-6 hours.  Wakes up with dry mouth in the mornings.  Head strap is loosing up.  Dry mouth despite Biotene and fullface mask. Hx bilateral TKR- disabled  CXR 05/18/12 IMPRESSION:  No acute abnormality.  Original Report Authenticated By: Doug Sou, M.D.  03/19/14- 66 yoM never smoker followed for OSA, rhinitis,     PCP Dr Jerilynn Mages. [name] FOLLOWS FOR: Wears CPAP 16/ [facility] every night for about 7-8 hours;Pt thinks his humidifier is not working correctly-will contact [facility] about this.  Tolerated spring pollen -Flonase  ROS-see HPI Constitutional:   No-   weight loss, night sweats,  fevers, chills, fatigue, lassitude. HEENT:   No-  headaches, difficulty swallowing, tooth/dental problems, sore throat,       No-  sneezing, itching, ear ache, nasal congestion, post nasal drip,  CV:  No-   chest pain, orthopnea, PND, swelling in lower extremities, anasarca, dizziness, palpitations Resp: No-   shortness of breath with exertion or at rest.              No-   productive cough,  No non-productive cough,  No- coughing up of blood.              No-   change in color of mucus.  No- wheezing.   Skin: No-   rash or lesions. GI:  No-   heartburn, indigestion, abdominal pain, nausea, vomiting, GU:  MS:  No-   joint pain or swelling.   Neuro-     nothing unusual Psych:  No- change in mood or affect. No depression or anxiety.  No memory loss.  Objective:   Physical Exam General- Alert, Oriented, Affect-appropriate, Distress- none acute, +obese Skin- rash-none, lesions- none, excoriation- none Lymphadenopathy- none Head- atraumatic            Eyes- Gross vision intact, PERRLA, conjunctivae clear secretions            Ears- Hearing, canals-normal            Nose- Clear, no-Septal dev, mucus, polyps, erosion, perforation  Throat- Mallampati IV , mucosa clear and it seems normally moist, drainage- none, tonsils- atrophic Neck- flexible , trachea midline, no stridor , thyroid nl, carotid no bruit Chest - symmetrical excursion , unlabored           Heart/CV- RRR , no murmur , no gallop  , no rub, nl s1 s2                           - JVD- none , edema 4+, stasis changes-+, varices- none           Lung- clear to P&A, wheeze- none, cough- none , dullness-none, rub- none           Chest wall-  Abd-  Br/ Gen/ Rectal- Not done, not indicated Extrem- cyanosis- none, clubbing, none, atrophy- none, strength- nl Neuro- grossly intact to observation

## 2014-04-06 ENCOUNTER — Other Ambulatory Visit: Payer: Self-pay | Admitting: Internal Medicine

## 2014-04-06 DIAGNOSIS — G4733 Obstructive sleep apnea (adult) (pediatric): Secondary | ICD-10-CM

## 2014-06-10 NOTE — Assessment & Plan Note (Signed)
Fair control at pressure 16. Discussed comfort measures for compliance Plan- he'll discuss humidifier attachment with his DME company

## 2014-06-13 ENCOUNTER — Other Ambulatory Visit: Payer: Self-pay | Admitting: Family Medicine

## 2014-06-14 LAB — BASIC METABOLIC PANEL WITH GFR
BUN: 12 mg/dL (ref 6–23)
CO2: 31 mEq/L (ref 19–32)
CREATININE: 1.01 mg/dL (ref 0.50–1.35)
Calcium: 8.9 mg/dL (ref 8.4–10.5)
Chloride: 100 mEq/L (ref 96–112)
GFR, EST NON AFRICAN AMERICAN: 82 mL/min
GFR, Est African American: >89 mL/min
Glucose, Bld: 121 mg/dL — ABNORMAL HIGH (ref 70–99)
Potassium: 3.8 mEq/L (ref 3.5–5.3)
Sodium: 138 mEq/L (ref 135–145)

## 2014-06-14 LAB — PSA: PSA: 3.78 ng/mL (ref <=4.00)

## 2014-06-14 LAB — HEMOGLOBIN A1C
HEMOGLOBIN A1C: 6.6 % — AB (ref <5.7)
MEAN PLASMA GLUCOSE: 143 mg/dL — AB (ref <117)

## 2014-06-15 ENCOUNTER — Other Ambulatory Visit: Payer: Self-pay

## 2014-06-15 MED ORDER — ATORVASTATIN CALCIUM 10 MG PO TABS: 10.0000 mg | ORAL_TABLET | Freq: Every day | ORAL | Status: DC

## 2014-06-15 MED ORDER — NIFEDIPINE ER OSMOTIC RELEASE 90 MG PO TB24: ORAL_TABLET | ORAL | Status: DC

## 2014-06-15 MED ORDER — BENAZEPRIL-HYDROCHLOROTHIAZIDE 20-25 MG PO TABS: ORAL_TABLET | ORAL | Status: DC

## 2014-06-15 MED ORDER — METFORMIN HCL 500 MG PO TABS: 500.0000 mg | ORAL_TABLET | Freq: Every day | ORAL | Status: DC

## 2014-06-19 ENCOUNTER — Encounter: Payer: Self-pay | Admitting: Family Medicine

## 2014-06-19 ENCOUNTER — Ambulatory Visit (INDEPENDENT_AMBULATORY_CARE_PROVIDER_SITE_OTHER): Payer: BC Managed Care – PPO | Admitting: Family Medicine

## 2014-06-19 VITALS — BP 122/82 | HR 81 | Resp 16 | Ht 67.0 in | Wt 296.8 lb

## 2014-06-19 DIAGNOSIS — K649 Unspecified hemorrhoids: Secondary | ICD-10-CM

## 2014-06-19 DIAGNOSIS — E119 Type 2 diabetes mellitus without complications: Secondary | ICD-10-CM

## 2014-06-19 DIAGNOSIS — I10 Essential (primary) hypertension: Secondary | ICD-10-CM

## 2014-06-19 DIAGNOSIS — Z23 Encounter for immunization: Secondary | ICD-10-CM

## 2014-06-19 DIAGNOSIS — E1169 Type 2 diabetes mellitus with other specified complication: Secondary | ICD-10-CM

## 2014-06-19 DIAGNOSIS — B351 Tinea unguium: Secondary | ICD-10-CM

## 2014-06-19 DIAGNOSIS — E785 Hyperlipidemia, unspecified: Secondary | ICD-10-CM

## 2014-06-19 DIAGNOSIS — K648 Other hemorrhoids: Secondary | ICD-10-CM

## 2014-06-19 HISTORY — DX: Unspecified hemorrhoids: K64.9

## 2014-06-19 LAB — LIPID PANEL
CHOLESTEROL: 117 mg/dL (ref 0–200)
HDL: 38 mg/dL — AB (ref >39)
LDL Cholesterol: 55 mg/dL (ref 0–99)
TRIGLYCERIDES: 121 mg/dL (ref <150)
Total CHOL/HDL Ratio: 3.1 Ratio
VLDL: 24 mg/dL (ref 0–40)

## 2014-06-19 LAB — HEPATIC FUNCTION PANEL
ALBUMIN: 4 g/dL (ref 3.5–5.2)
ALT: 19 U/L (ref 0–53)
AST: 16 U/L (ref 0–37)
Alkaline Phosphatase: 39 U/L (ref 39–117)
BILIRUBIN INDIRECT: 0.4 mg/dL (ref 0.2–1.2)
Bilirubin, Direct: 0.1 mg/dL (ref 0.0–0.3)
Total Bilirubin: 0.5 mg/dL (ref 0.2–1.2)
Total Protein: 7.1 g/dL (ref 6.0–8.3)

## 2014-06-19 NOTE — Assessment & Plan Note (Signed)
Will check updated lDL, had recent dose reduction in statin, lab add on

## 2014-06-19 NOTE — Assessment & Plan Note (Signed)
unchnaged Patient re-educated about  the importance of commitment to a  minimum of 150 minutes of exercise per week. The importance of healthy food choices with portion control discussed. Encouraged to start a food diary, count calories and to consider  joining a support group. Sample diet sheets offered. Goals set by the patient for the next several months.    

## 2014-06-19 NOTE — Patient Instructions (Signed)
F/u with rectal in 4 month, call if you need me before  Increase metformin to ONE daily  We will call with cholesterol info tomorrow and also to let you know to fill new script that will be sent for fungal infection once liver is good  Excellent BP, may be able to reduce dose at next visit  Lets get disciplined re care of body so thing will continue to improve  hBA1c cmp7 and EGFR, CBC, tSH, and microalb non fast in 4 month  Flu vaccine today  Please sched eye exam this is past due

## 2014-06-19 NOTE — Assessment & Plan Note (Signed)
Well controlled, may be able to reduce med dose at next visit DASH diet and commitment to daily physical activity for a minimum of 30 minutes discussed and encouraged, as a part of hypertension management. The importance of attaining a healthy weight is also discussed.

## 2014-06-19 NOTE — Assessment & Plan Note (Signed)
Severe, will re treat x 4 months if liver function tests are good

## 2014-06-19 NOTE — Progress Notes (Signed)
Subjective:    Patient ID: Christopher Lambert, male    DOB: 25-Aug-1956, 58 y.o.   MRN: [id number]  HPI  The PT is here for follow up and re-evaluation of chronic medical conditions, medication management and review of any available recent lab and radiology data.  Preventive health is updated, specifically  Cancer screening and Immunization.   . The PT denies any adverse reactions to current medications since the last visit.  There are no new concerns. Has been having intermittent rectal bleeding for the past 1 month due to flare of hemorhoids. Denies change in stiool caliber, elects to have rectal at next visit due to current condition , which is reasonable. Has been inconsistent with health lifestyle , but will work on this There are no specific complaints      Review of Systems See HPI Denies recent fever or chills. Denies sinus pressure, nasal congestion, ear pain or sore throat. Denies chest congestion, productive cough or wheezing. Denies chest pains, palpitations and leg swelling Denies abdominal pain, nausea, vomiting,diarrhea has had some  Hard stool with decreased  Denies dysuria, frequency, hesitancy or incontinence. Denies joint pain, swelling and limitation in mobility. Denies headaches, seizures, numbness, or tingling. Denies depression, anxiety or insomnia. excessive fungal infection of toenails and feet    Objective:   Physical Exam BP 122/82  Pulse 81  Resp 16  Ht 5\' 7"  (1.702 m)  Wt 296 lb 12.8 oz (134.628 kg)  BMI 46.47 kg/m2  SpO2 96% Patient alert and oriented and in no cardiopulmonary distress.  HEENT: No facial asymmetry, EOMI,   oropharynx pink and moist.  Neck supple no JVD, no mass.  Chest: Clear to auscultation bilaterally.  CVS: S1, S2 no murmurs, no S3.Regular rate.  ABD: Soft non tender.   Ext: No edema  MS: Adequate ROM spine, shoulders, hips and knees.  Skin: severe  tinea pedis and onychomycosis bilaterally Psych: Good eye contact,  normal affect. Memory intact not anxious or depressed appearing.  CNS: CN 2-12 intact, power,  normal throughout.no focal deficits noted.        Assessment & Plan:  Piles (hemorrhoids) Bleeding x 4 weeks intermittent has established hemmoryoids and has been straining at stool from bad diet, will chnage this, states he will start increased vegetables toaddress  Essential hypertension, benign Well controlled, may be able to reduce med dose at next visit DASH diet and commitment to daily physical activity for a minimum of 30 minutes discussed and encouraged, as a part of hypertension management. The importance of attaining a healthy weight is also discussed.   DM type 2 with diabetic dyslipidemia Controlled but hBa1C has increased , pt was taking half tab metformin , he is to increase to whole Patient advised to reduce carb and sweets, commit to regular physical activity, take meds as prescribed, test blood as directed, and attempt to lose weight, to improve blood sugar control.   Onychomycosis Severe, will re treat x 4 months if liver function tests are good  Morbid obesity unchnaged Patient re-educated about  the importance of commitment to a  minimum of 150 minutes of exercise per week. The importance of healthy food choices with portion control discussed. Encouraged to start a food diary, count calories and to consider  joining a support group. Sample diet sheets offered. Goals set by the patient for the next several months.     Dyslipidemia, goal LDL below 100 Will check updated lDL, had recent dose reduction in statin, lab  add on  Need for prophylactic vaccination and inoculation against influenza Vaccine administered at visit.

## 2014-06-19 NOTE — Assessment & Plan Note (Signed)
Controlled but hBa1C has increased , pt was taking half tab metformin , he is to increase to whole Patient advised to reduce carb and sweets, commit to regular physical activity, take meds as prescribed, test blood as directed, and attempt to lose weight, to improve blood sugar control.

## 2014-06-19 NOTE — Assessment & Plan Note (Signed)
Vaccine administered at visit.  

## 2014-06-19 NOTE — Assessment & Plan Note (Signed)
Bleeding x 4 weeks intermittent has established hemmoryoids and has been straining at stool from bad diet, will chnage this, states he will start increased vegetables toaddress

## 2014-06-21 ENCOUNTER — Ambulatory Visit: Payer: BC Managed Care – PPO | Admitting: Family Medicine

## 2014-07-03 ENCOUNTER — Ambulatory Visit: Payer: BC Managed Care – PPO | Admitting: Family Medicine

## 2014-10-23 ENCOUNTER — Ambulatory Visit (INDEPENDENT_AMBULATORY_CARE_PROVIDER_SITE_OTHER): Payer: BC Managed Care – PPO | Admitting: Family Medicine

## 2014-10-23 ENCOUNTER — Encounter: Payer: Self-pay | Admitting: Family Medicine

## 2014-10-23 VITALS — BP 120/70 | HR 72 | Resp 16 | Ht 67.0 in | Wt 298.0 lb

## 2014-10-23 DIAGNOSIS — Z23 Encounter for immunization: Secondary | ICD-10-CM

## 2014-10-23 DIAGNOSIS — E119 Type 2 diabetes mellitus without complications: Secondary | ICD-10-CM

## 2014-10-23 DIAGNOSIS — E1169 Type 2 diabetes mellitus with other specified complication: Secondary | ICD-10-CM

## 2014-10-23 DIAGNOSIS — I10 Essential (primary) hypertension: Secondary | ICD-10-CM

## 2014-10-23 DIAGNOSIS — G4733 Obstructive sleep apnea (adult) (pediatric): Secondary | ICD-10-CM

## 2014-10-23 DIAGNOSIS — E785 Hyperlipidemia, unspecified: Secondary | ICD-10-CM

## 2014-10-23 DIAGNOSIS — Z1211 Encounter for screening for malignant neoplasm of colon: Secondary | ICD-10-CM

## 2014-10-23 DIAGNOSIS — E8881 Metabolic syndrome: Secondary | ICD-10-CM

## 2014-10-23 LAB — COMPLETE METABOLIC PANEL WITH GFR
ALBUMIN: 3.8 g/dL (ref 3.5–5.2)
ALK PHOS: 31 U/L — AB (ref 39–117)
ALT: 22 U/L (ref 0–53)
AST: 16 U/L (ref 0–37)
BUN: 15 mg/dL (ref 6–23)
CHLORIDE: 101 meq/L (ref 96–112)
CO2: 30 mEq/L (ref 19–32)
CREATININE: 0.93 mg/dL (ref 0.50–1.35)
Calcium: 8.7 mg/dL (ref 8.4–10.5)
GFR, Est African American: >89 mL/min
GFR, Est Non African American: >89 mL/min
Glucose, Bld: 116 mg/dL — ABNORMAL HIGH (ref 70–99)
Potassium: 4.2 mEq/L (ref 3.5–5.3)
Sodium: 139 mEq/L (ref 135–145)
TOTAL PROTEIN: 6.6 g/dL (ref 6.0–8.3)
Total Bilirubin: 0.6 mg/dL (ref 0.2–1.2)

## 2014-10-23 LAB — POC HEMOCCULT BLD/STL (OFFICE/1-CARD/DIAGNOSTIC): FECAL OCCULT BLD: NEGATIVE

## 2014-10-23 LAB — CBC WITH DIFFERENTIAL/PLATELET
Basophils Absolute: 0 K/uL (ref 0.0–0.1)
Basophils Relative: 0 % (ref 0–1)
Eosinophils Absolute: 0.1 K/uL (ref 0.0–0.7)
Eosinophils Relative: 3 % (ref 0–5)
HCT: 42.5 % (ref 39.0–52.0)
Hemoglobin: 14.4 g/dL (ref 13.0–17.0)
Lymphocytes Relative: 31 % (ref 12–46)
Lymphs Abs: 1.2 K/uL (ref 0.7–4.0)
MCH: 30.1 pg (ref 26.0–34.0)
MCHC: 33.9 g/dL (ref 30.0–36.0)
MCV: 88.7 fL (ref 78.0–100.0)
Monocytes Absolute: 0.4 K/uL (ref 0.1–1.0)
Monocytes Relative: 9 % (ref 3–12)
Neutro Abs: 2.2 K/uL (ref 1.7–7.7)
Neutrophils Relative %: 57 % (ref 43–77)
Platelets: 203 K/uL (ref 150–400)
RBC: 4.79 MIL/uL (ref 4.22–5.81)
RDW: 14.2 % (ref 11.5–15.5)
WBC: 3.9 K/uL — ABNORMAL LOW (ref 4.0–10.5)

## 2014-10-23 LAB — HEMOGLOBIN A1C
HEMOGLOBIN A1C: 6.8 % — AB (ref <5.7)
Mean Plasma Glucose: 148 mg/dL — ABNORMAL HIGH (ref <117)

## 2014-10-23 LAB — MICROALBUMIN / CREATININE URINE RATIO
Creatinine, Urine: 277.9 mg/dL
MICROALB UR: 0.4 mg/dL (ref <2.0)
Microalb Creat Ratio: 1.4 mg/g (ref 0.0–30.0)

## 2014-10-23 LAB — TSH: TSH: 2.27 u[IU]/mL (ref 0.350–4.500)

## 2014-10-23 NOTE — Assessment & Plan Note (Signed)
Deteriorated Patient advised to reduce carb and sweets, commit to regular physical activity, take meds as prescribed, test blood as directed, and attempt to lose weight, to improve blood sugar control. Updated lab needed at/ before next visit.

## 2014-10-23 NOTE — Progress Notes (Signed)
Subjective:    Patient ID: Christopher Lambert, male    DOB: 1956/09/13, 59 y.o.   MRN: [id number]  HPI  The PT is here for follow up and re-evaluation of chronic medical conditions, medication management and review of any available recent lab and radiology data.  Preventive health is updated, specifically  Cancer screening and Immunization.   Questions or concerns regarding consultations or procedures which the PT has had in the interim are  addressed. The PT denies any adverse reactions to current medications since the last visit.  There are no new concerns.  There are no specific complaints  Denies polyuria, polydipsia, blurred vision , or hypoglycemic episodes.      Review of Systems See HPI Denies recent fever or chills. Denies sinus pressure, nasal congestion, ear pain or sore throat. Denies chest congestion, productive cough or wheezing. Denies chest pains, palpitations and leg swelling Denies abdominal pain, nausea, vomiting,diarrhea or constipation.   Denies dysuria, frequency, hesitancy or incontinence. Denies joint pain, swelling and limitation in mobility. Denies headaches, seizures, numbness, or tingling. Denies depression, anxiety or insomnia. Denies skin break down or rash.        Objective:   Physical Exam  BP 120/70 mmHg  Pulse 72  Resp 16  Ht 5\' 7"  (1.702 m)  Wt 298 lb (135.172 kg)  BMI 46.66 kg/m2  SpO2 97% Patient alert and oriented and in no cardiopulmonary distress.  HEENT: No facial asymmetry, EOMI,   oropharynx pink and moist.  Neck supple no JVD, no mass.  Chest: Clear to auscultation bilaterally.  CVS: S1, S2 no murmurs, no S3.Regular rate.  ABD: Soft non tender.   Ext: No edema  MS: Adequate ROM spine, shoulders, hips and knees.  Skin: Intact, no ulcerations or rash noted.  Psych: Good eye contact, normal affect. Memory intact not anxious or depressed appearing.  CNS: CN 2-12 intact, power,  normal throughout.no focal deficits  noted.       Assessment & Plan:  Essential hypertension, benign Controlled, no change in medication DASH diet and commitment to daily physical activity for a minimum of 30 minutes discussed and encouraged, as a part of hypertension management. The importance of attaining a healthy weight is also discussed.    DM type 2 with diabetic dyslipidemia Deteriorated Patient advised to reduce carb and sweets, commit to regular physical activity, take meds as prescribed, test blood as directed, and attempt to lose weight, to improve blood sugar control. Updated lab needed at/ before next visit.    Need for vaccination with 13-polyvalent pneumococcal conjugate vaccine Vaccine administered at visit.    Dyslipidemia, goal LDL below 100 Controlled, no change in medication Hyperlipidemia:Low fat diet discussed and encouraged.  Updated lab needed at/ before next visit.    Morbid obesity Deteriorated. Patient re-educated about  the importance of commitment to a  minimum of 150 minutes of exercise per week. The importance of healthy food choices with portion control discussed. Encouraged to start a food diary, count calories and to consider  joining a support group. Sample diet sheets offered. Goals set by the patient for the next several months.      Metabolic syndrome The increased risk of cardiovascular disease associated with this diagnosis, and the need to consistently work on lifestyle to change this is discussed. Following  a  heart healthy diet ,commitment to 30 minutes of exercise at least 5 days per week, as well as control of blood sugar and cholesterol , and achieving a  healthy weight are all the areas to be addressed .    Obstructive sleep apnea Importance of ongoing compliance with CPAP is stressed   Special screening for malignant neoplasms, colon Heme negative stool, prostate smooth and normal in size, no [palpable rectal mass

## 2014-10-23 NOTE — Patient Instructions (Signed)
F/u in 4.5 month, call if you need me before  PLEASE get eye exam , past due  It is important that you exercise regularly at least 30 minutes 5 times a week. If you develop chest pain, have severe difficulty breathing, or feel very tired, stop exercising immediately and seek medical attention  A healthy diet is rich in fruit, vegetables and whole grains. Poultry fish, nuts and beans are a healthy choice for protein rather then red meat. A low sodium diet and drinking 64 ounces of water daily is generally recommended. Oils and sweet should be limited. Carbohydrates especially for those who are diabetic or overweight, should be limited to 45 to 60 gram per meal. It is important to eat on a regular schedule, at least 3 times daily. Snacks should be primarily fruits, vegetables or nuts.  No hidden blood in stool and normal rectal exam  Prevnar today  Try and improve blood sugar, blood pressure is exellent  Fasting lipid, cmp and EGFr, HBA1C in 4.5 month

## 2014-10-23 NOTE — Assessment & Plan Note (Signed)
Vaccine administered at visit.  

## 2014-10-23 NOTE — Assessment & Plan Note (Signed)
Controlled, no change in medication DASH diet and commitment to daily physical activity for a minimum of 30 minutes discussed and encouraged, as a part of hypertension management. The importance of attaining a healthy weight is also discussed.  

## 2014-10-29 NOTE — Assessment & Plan Note (Signed)
Heme negative stool, prostate smooth and normal in size, no [palpable rectal mass

## 2014-10-29 NOTE — Assessment & Plan Note (Signed)
Deteriorated. Patient re-educated about  the importance of commitment to a  minimum of 150 minutes of exercise per week. The importance of healthy food choices with portion control discussed. Encouraged to start a food diary, count calories and to consider  joining a support group. Sample diet sheets offered. Goals set by the patient for the next several months.    

## 2014-10-29 NOTE — Assessment & Plan Note (Signed)
The increased risk of cardiovascular disease associated with this diagnosis, and the need to consistently work on lifestyle to change this is discussed. Following  a  heart healthy diet ,commitment to 30 minutes of exercise at least 5 days per week, as well as control of blood sugar and cholesterol , and achieving a healthy weight are all the areas to be addressed .  

## 2014-10-29 NOTE — Assessment & Plan Note (Signed)
Controlled, no change in medication Hyperlipidemia:Low fat diet discussed and encouraged.  Updated lab needed at/ before next visit.  

## 2014-10-29 NOTE — Assessment & Plan Note (Signed)
Importance of ongoing compliance with CPAP is stressed

## 2014-12-24 ENCOUNTER — Other Ambulatory Visit: Payer: Self-pay | Admitting: Family Medicine

## 2015-01-09 ENCOUNTER — Telehealth: Payer: Self-pay | Admitting: Internal Medicine

## 2015-01-09 NOTE — Telephone Encounter (Signed)
Pt is in need of new CPAP supplies. Has upcoming appointment on 03/21/15. Needs to be seen before this time due to needing supplies.  CY - please advise if we can work pt in sooner.

## 2015-01-10 NOTE — Telephone Encounter (Signed)
Pt returning call for eariler appointment and i used 5/11 @ 11:15 ok to use per Joellen Jersey.Hillery Hunter

## 2015-01-10 NOTE — Telephone Encounter (Signed)
Katie, please see what scheduling we can work out

## 2015-01-10 NOTE — Telephone Encounter (Signed)
Wednesday 01-23-15 at 11:15am can be used. Thanks.

## 2015-01-10 NOTE — Telephone Encounter (Signed)
[email or web address] x1 

## 2015-01-23 ENCOUNTER — Encounter: Payer: Self-pay | Admitting: Internal Medicine

## 2015-01-23 ENCOUNTER — Ambulatory Visit (INDEPENDENT_AMBULATORY_CARE_PROVIDER_SITE_OTHER): Payer: Medicare Other | Admitting: Internal Medicine

## 2015-01-23 VITALS — BP 126/76 | HR 73 | Ht 67.5 in | Wt 297.8 lb

## 2015-01-23 DIAGNOSIS — G4733 Obstructive sleep apnea (adult) (pediatric): Secondary | ICD-10-CM

## 2015-01-23 NOTE — Progress Notes (Signed)
Patient ID: Christopher Lambert, male    DOB: Jan 16, 1956, 59 y.o.   MRN: [id number]  HPI 02/13/11- OSA, rhinitis Last here August 20, 2010. Note reviewed. Continues CPAP 16, all night every night, all night. Still occasional yawn and nod. Tried Provigil for residual sleepiness but it caused tachy palpitation. He has not been a coffee drinker.  Mild nasal congestion early Spring, managed otc, but ok now.   08/19/11- 4 yoM never smoker followed for OSA, rhinitis, PAF PCP Dr Tula Nakayama Has had flu vaccine. 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 Continues CPAP at 16 CWP with good compliance and control. Still experiences daytime sleepiness if quiet and will get himself caffeine to help. Has had intervals of sustained tachycardia palpitation at least twice. He describes a sudden onset without pain or stress, gradually easing off. He carries a diagnosis of atrial fib on his problem list.  02/23/12- 55 yoM never smoker followed for OSA, rhinitis,     PCP Dr Jerilynn Mages. [name] Wears CPAP every night for approx 5-6 hours; pressure  working well; was checked by [facility]) and given okay on heart. Good with CPAP 16/ [name]. Waiting arrival for new mask. Some dry mouth  03/16/13- 57 yoM never smoker followed for OSA, rhinitis,     PCP Dr Jerilynn Mages. [name] Yearly Follow up.  Wearing cpap16/ [name]  every night for approx 5-6 hours.  Wakes up with dry mouth in the mornings.  Head strap is loosing up.  Dry mouth despite Biotene and fullface mask. Hx bilateral TKR- disabled  CXR 05/18/12 IMPRESSION:  No acute abnormality.  Original Report Authenticated By: Christopher Lambert, M.D.  03/19/14- 33 yoM never smoker followed for OSA, rhinitis,     PCP Dr Jerilynn Mages. [name] FOLLOWS FOR: Wears CPAP 16/ Christopher Lambert every night for about 7-8 hours;Pt thinks his humidifier is not working correctly-will contact Christopher Lambert about this.  Tolerated spring pollen -Flonase  01/23/15- 58 yoM never smoker followed for OSA, rhinitis,     PCP Dr  Jerilynn Mages. [name] FOLLOWS FOR: Due to contract of insurnace with [name]-pt will need to go through [facility] sleep study is too old per [facility]-will need new sleep study to transfer to [facility]. Wears CPAP 16 every night for about 8-9 hours. We discussed transfer to [facility] company to take over management of his CPAP. We should be able to get an unattended study for this documentation  ROS-see HPI Constitutional:   No-   weight loss, night sweats, fevers, chills, fatigue, lassitude. HEENT:   No-  headaches, difficulty swallowing, tooth/dental problems, sore throat,       No-  sneezing, itching, ear ache, nasal congestion, post nasal drip,  CV:  No-   chest pain, orthopnea, PND, swelling in lower extremities, anasarca, dizziness, palpitations Resp: No-   shortness of breath with exertion or at rest.              No-   productive cough,  No non-productive cough,  No- coughing up of blood.              No-   change in color of mucus.  No- wheezing.   Skin: No-   rash or lesions. GI:  No-   heartburn, indigestion, abdominal pain, nausea, vomiting, GU:  MS:  No-   joint pain or swelling.   Neuro-     nothing unusual Psych:  No- change in mood or affect. No depression or anxiety.  No memory loss.  Objective:   Physical Exam General- Alert, Oriented, Affect-appropriate, Distress- none acute,                +obese Skin- rash-none, lesions- none, excoriation- none Lymphadenopathy- none Head- atraumatic            Eyes- Gross vision intact, PERRLA, conjunctivae clear secretions            Ears- Hearing, canals-normal            Nose- Clear, no-Septal dev, mucus, polyps, erosion, perforation             Throat- Mallampati IV , mucosa clear and it seems normally moist, drainage- none, tonsils- atrophic Neck- flexible , trachea midline, no stridor , thyroid nl, carotid no bruit Chest - symmetrical excursion , unlabored           Heart/CV- RRR , no murmur , no gallop  , no rub, nl s1 s2                            - JVD- none , edema 3+, stasis changes-+, varices- none           Lung- clear to P&A, wheeze- none, cough- none , dullness-none, rub- none           Chest wall-  Abd-  Br/ Gen/ Rectal- Not done, not indicated Extrem- +scars from bilateral TKR Neuro- grossly intact to observation

## 2015-01-23 NOTE — Assessment & Plan Note (Signed)
He should be a good candidate for an unattended home sleep study to 3 document his obstructive sleep apnea. He has no cardiopulmonary disease or other complicating issues. If his insurance will not cover, we can arrange an unattended study. Once this is completed we can go forward with ordering replacement CPAP machine and supplies from his new DME company.

## 2015-01-23 NOTE — Assessment & Plan Note (Signed)
He remains too heavy. Mobility is better since he's had both knees replaced, but still limited. Option to refer for bariatric evaluation.

## 2015-01-23 NOTE — Patient Instructions (Addendum)
Order- schedule Unattended home sleep study   Dx OSA Sent to [facility] for in lab study-quicker for patient.  As soon as we have the documentation from your sleep study, we should be able to order your replacement CPAP machine and supplies through [facility]  It will be important that we see you back between 31 and 90 days after your study

## 2015-01-31 ENCOUNTER — Ambulatory Visit: Payer: Medicare Other | Attending: Internal Medicine | Admitting: Sleep Medicine

## 2015-01-31 DIAGNOSIS — G4730 Sleep apnea, unspecified: Secondary | ICD-10-CM | POA: Diagnosis present

## 2015-01-31 DIAGNOSIS — Z6841 Body Mass Index (BMI) 40.0 and over, adult: Secondary | ICD-10-CM | POA: Insufficient documentation

## 2015-01-31 DIAGNOSIS — G4710 Hypersomnia, unspecified: Secondary | ICD-10-CM | POA: Insufficient documentation

## 2015-01-31 DIAGNOSIS — G4733 Obstructive sleep apnea (adult) (pediatric): Secondary | ICD-10-CM

## 2015-02-01 ENCOUNTER — Telehealth: Payer: Self-pay | Admitting: Internal Medicine

## 2015-02-01 NOTE — Telephone Encounter (Signed)
Pt called back. Per note he only needs appt. Pt already has appt for July 6. Made him aware.

## 2015-02-01 NOTE — Telephone Encounter (Signed)
Looks like he needs ov 30-90 days p PSG LMTCB

## 2015-02-01 NOTE — Telephone Encounter (Signed)
Pt just needs appt with [facility] in 31-90 days  LMTCB to make his appt

## 2015-02-01 NOTE — Telephone Encounter (Signed)
Pt returned call [phone]

## 2015-02-10 DIAGNOSIS — G4733 Obstructive sleep apnea (adult) (pediatric): Secondary | ICD-10-CM | POA: Diagnosis not present

## 2015-02-10 NOTE — Sleep Study (Signed)
   NAME: Christopher Lambert DATE OF BIRTH:  Jun 07, 1956 MEDICAL RECORD NUMBER [id number]  LOCATION: [facility]  PHYSICIAN: Inri Sobieski D  DATE OF STUDY: 01/31/2015  SLEEP STUDY TYPE: Nocturnal Polysomnogram               REFERRING PHYSICIAN: Baird Lyons D, MD  INDICATION FOR STUDY: Hypersomnia with sleep apnea  EPWORTH SLEEPINESS SCORE:   7/24 HEIGHT: 5' 7.5" (171.5 cm)  WEIGHT: 294 lb (133.358 kg)    Body mass index is 45.34 kg/(m^2).  NECK SIZE: 18 in.  MEDICATIONS: Charted for review  SLEEP ARCHITECTURE: Split study protocol. During the diagnostic phase, total sleep time 104.5 minutes with sleep efficiency 66.6%. Stage I was 38.3%, stage II 49.3%, stage III absent, REM 12.4% of total sleep time. Sleep latency 10.5 minutes, REM latency 48 minutes, awake after sleep onset 36 minutes, arousal index 54.5, bedtime medication: Benazepril HCTZ, metformin, atorvastatin, aspirin, vitamin D, Flonase  RESPIRATORY DATA: Apnea hypopnea index (AHI) 49.4 per hour. 86 total events scored including 5 obstructive apneas and 81 hypopneas. All events were while supine. REM AHI 64.6 per hour. CPAP was titrated to 16 CWP, AHI 0 per hour. He wore a medium fullface mask.  OXYGEN DATA: Loud snoring before CPAP with oxygen desaturation to a nadir of 84% on room air. With CPAP control, snoring was prevented and mean oxygen saturation was 95.5%.  CARDIAC DATA: Sinus rhythm with occasional PAC  MOVEMENT/PARASOMNIA: Limb jerks were noted only during the titration phase. A total of 51 them jerks were counted of which 6 were associated with arousal or awakening for periodic limb movement with arousal index of 2.2 per hour. Bathroom 1  IMPRESSION/ RECOMMENDATION:   1) Severe obstructive sleep apnea/hypopnea syndrome, AHI 49.4 per hour with mostly supine sleep and events. REM AHI 64.6 per hour. Loud snoring with oxygen desaturation to a nadir of 84% on room air. 2) CPAP titration to 16 CWP.  Adequate control of obstructive events was noted at pressures of 11 CWP and higher. Suggest initial trial at CPAP 13. He wore a medium F&P Simplus fullface mask with heated humidifier. Snoring was prevented and mean oxygen saturation was 95.5%.  Deneise Lever Diplomate, American Board of Sleep Medicine  ELECTRONICALLY SIGNED ON:  02/10/2015, 9:17 AM [place] PH: [phone]   FX: [phone] [place]

## 2015-02-26 LAB — COMPLETE METABOLIC PANEL WITH GFR
ALT: 23 U/L (ref 0–53)
AST: 19 U/L (ref 0–37)
Albumin: 3.9 g/dL (ref 3.5–5.2)
Alkaline Phosphatase: 35 U/L — ABNORMAL LOW (ref 39–117)
BILIRUBIN TOTAL: 0.7 mg/dL (ref 0.2–1.2)
BUN: 12 mg/dL (ref 6–23)
CO2: 29 meq/L (ref 19–32)
Calcium: 8.7 mg/dL (ref 8.4–10.5)
Chloride: 99 mEq/L (ref 96–112)
Creat: 0.99 mg/dL (ref 0.50–1.35)
GFR, EST NON AFRICAN AMERICAN: 84 mL/min
Glucose, Bld: 98 mg/dL (ref 70–99)
Potassium: 3.8 mEq/L (ref 3.5–5.3)
Sodium: 139 mEq/L (ref 135–145)
Total Protein: 7 g/dL (ref 6.0–8.3)

## 2015-02-26 LAB — LIPID PANEL
CHOL/HDL RATIO: 2.8 ratio
CHOLESTEROL: 109 mg/dL (ref 0–200)
HDL: 39 mg/dL — ABNORMAL LOW (ref >=40)
LDL CALC: 53 mg/dL (ref 0–99)
Triglycerides: 86 mg/dL (ref <150)
VLDL: 17 mg/dL (ref 0–40)

## 2015-02-26 LAB — HEMOGLOBIN A1C
Hgb A1c MFr Bld: 6.3 % — ABNORMAL HIGH (ref <5.7)
MEAN PLASMA GLUCOSE: 134 mg/dL — AB (ref <117)

## 2015-02-27 ENCOUNTER — Ambulatory Visit (INDEPENDENT_AMBULATORY_CARE_PROVIDER_SITE_OTHER): Payer: Medicare Other | Admitting: Family Medicine

## 2015-02-27 ENCOUNTER — Encounter: Payer: Self-pay | Admitting: Family Medicine

## 2015-02-27 ENCOUNTER — Telehealth: Payer: Self-pay | Admitting: Internal Medicine

## 2015-02-27 VITALS — BP 124/74 | HR 61 | Resp 16 | Ht 68.0 in | Wt 292.8 lb

## 2015-02-27 DIAGNOSIS — E1169 Type 2 diabetes mellitus with other specified complication: Secondary | ICD-10-CM | POA: Diagnosis not present

## 2015-02-27 DIAGNOSIS — Z125 Encounter for screening for malignant neoplasm of prostate: Secondary | ICD-10-CM

## 2015-02-27 DIAGNOSIS — G4733 Obstructive sleep apnea (adult) (pediatric): Secondary | ICD-10-CM

## 2015-02-27 DIAGNOSIS — I10 Essential (primary) hypertension: Secondary | ICD-10-CM

## 2015-02-27 DIAGNOSIS — Z23 Encounter for immunization: Secondary | ICD-10-CM | POA: Diagnosis not present

## 2015-02-27 DIAGNOSIS — E785 Hyperlipidemia, unspecified: Secondary | ICD-10-CM

## 2015-02-27 DIAGNOSIS — E119 Type 2 diabetes mellitus without complications: Secondary | ICD-10-CM | POA: Diagnosis not present

## 2015-02-27 DIAGNOSIS — J3089 Other allergic rhinitis: Secondary | ICD-10-CM

## 2015-02-27 DIAGNOSIS — E8881 Metabolic syndrome: Secondary | ICD-10-CM

## 2015-02-27 MED ORDER — SIMVASTATIN 5 MG PO TABS: 5.0000 mg | ORAL_TABLET | Freq: Every day | ORAL | Status: DC

## 2015-02-27 NOTE — Progress Notes (Signed)
Subjective:    Patient ID: Christopher Lambert, male    DOB: 04/07/1956, 59 y.o.   MRN: [id number]  HPI   AMOND SPERANZA     MRN: [id number]      DOB: 08-14-1956   HPI Mr. Cornforth is here for follow up and re-evaluation of chronic medical conditions, medication management and review of any available recent lab and radiology data.  Preventive health is updated, specifically  Cancer screening and Immunization.   Questions or concerns regarding consultations or procedures which the PT has had in the interim are  addressed. The PT denies any adverse reactions to current medications since the last visit.  Denies polyuria, polydipsia, blurred vision , or hypoglycemic episodes. ROS Denies recent fever or chills. Denies sinus pressure, nasal congestion, ear pain or sore throat. Denies chest congestion, productive cough or wheezing. Denies chest pains, palpitations and leg swelling Denies abdominal pain, nausea, vomiting,diarrhea or constipation.   Denies dysuria, frequency, hesitancy or incontinence. Denies uncontrolled joint pain, swelling and limitation in mobility. Denies headaches, seizures, numbness, or tingling. Denies depression, anxiety or insomnia. C/o small cut on left 5th finger due to recent injury , accidental, no pain, redness or drainage  PE  BP 124/74 mmHg  Pulse 61  Resp 16  Ht 5\' 8"  (1.727 m)  Wt 292 lb 12.8 oz (132.813 kg)  BMI 44.53 kg/m2  SpO2 100%  Patient alert and oriented and in no cardiopulmonary distress.  HEENT: No facial asymmetry, EOMI,   oropharynx pink and moist.  Neck supple no JVD, no mass.  Chest: Clear to auscultation bilaterally.  CVS: S1, S2 no murmurs, no S3.Regular rate.  ABD: Soft non tender.   Ext: No edema  MS: Adequate ROM spine, shoulders, hips and knees.  Skin: open laceration on left 5th digit , approx 1.5 cm long  Psych: Good eye contact, normal affect. Memory intact not anxious or depressed appearing.  CNS: CN 2-12 intact, power,   normal throughout.no focal deficits noted.   Assessment & Plan   Essential hypertension, benign Controlled, no change in medication DASH diet and commitment to daily physical activity for a minimum of 30 minutes discussed and encouraged, as a part of hypertension management. The importance of attaining a healthy weight is also discussed.  BP/Weight 02/27/2015 01/31/2015 01/23/2015 10/23/2014 06/19/2014 03/19/2014 0/06/2724  Systolic BP 366 - 440 347 425 956 387  Diastolic BP 74 - 76 70 82 86 80  Wt. (Lbs) 292.8 294 297.8 298 296.8 - 297  BMI 44.53 45.34 45.93 46.66 46.47 - 45.8        Morbid obesity Improved Patient re-educated about  the importance of commitment to a  minimum of 150 minutes of exercise per week.  The importance of healthy food choices with portion control discussed. Encouraged to start a food diary, count calories and to consider  joining a support group. Sample diet sheets offered. Goals set by the patient for the next several months.   Weight /BMI 02/27/2015 01/31/2015 01/23/2015  WEIGHT 292 lb 12.8 oz 294 lb 297 lb 12.8 oz  HEIGHT 5\' 8"  5' 7.5" 5' 7.5"  BMI 44.53 kg/m2 45.34 kg/m2 45.93 kg/m2    Current exercise per week 120 minutes.   Metabolic syndrome The increased risk of cardiovascular disease associated with this diagnosis, and the need to consistently work on lifestyle to change this is discussed. Following  a  heart healthy diet ,commitment to 30 minutes of exercise at least 5 days per week, as  well as control of blood sugar and cholesterol , and achieving a healthy weight are all the areas to be addressed .   Dyslipidemia, goal LDL below 100 Over  Corrected reduced med dose. Hyperlipidemia:Low fat diet discussed and encouraged.   Lipid Panel  Lab Results  Component Value Date   CHOL 109 02/25/2015   HDL 39* 02/25/2015   LDLCALC 53 02/25/2015   TRIG 86 02/25/2015   CHOLHDL 2.8 02/25/2015  increased exercise to improve HDL     Obstructive  sleep apnea Reports consistent use of CPA machine  Allergic rhinitis No current symptoms , controlled , no change in management  DM type 2 with diabetic dyslipidemia Mr. Fosdick is reminded of the importance of commitment to daily physical activity for 30 minutes or more, as able and the need to limit carbohydrate intake to 30 to 60 grams per meal to help with blood sugar control.   The need to take medication as prescribed, test blood sugar as directed, and to call between visits if there is a concern that blood sugar is uncontrolled is also discussed.   Mr. Alessio is reminded of the importance of daily foot exam, annual eye examination, and good blood sugar, blood pressure and cholesterol control. Improved, pt applauded on this  Diabetic Labs Latest Ref Rng 02/25/2015 10/22/2014 06/13/2014 02/01/2014 10/10/2013  HbA1c <5.7 % 6.3(H) 6.8(H) 6.6(H) 6.5(H) -  Microalbumin <2.0 mg/dL - 0.4 - - 0.50  Micro/Creat Ratio 0.0 - 30.0 mg/g - 1.4 - - 4.6  Chol 0 - 200 mg/dL 109 - 117 99 -  HDL >=40 mg/dL 39(L) - 38(L) 34(L) -  Calc LDL 0 - 99 mg/dL 53 - 55 47 -  Triglycerides <150 mg/dL 86 - 121 90 -  Creatinine 0.50 - 1.35 mg/dL 0.99 0.93 1.01 0.99 -   BP/Weight 02/27/2015 01/31/2015 01/23/2015 10/23/2014 06/19/2014 03/19/2014 6/59/9357  Systolic BP 017 - 793 903 009 233 007  Diastolic BP 74 - 76 70 82 86 80  Wt. (Lbs) 292.8 294 297.8 298 296.8 - 297  BMI 44.53 45.34 45.93 46.66 46.47 - 45.8   Foot/eye exam completion dates 02/27/2015  Foot Form Completion Done         Need for Tdap vaccination Laceration to left 5th finger, open, not infected, TdAP administered as past due      Review of Systems     Objective:   Physical Exam        Assessment & Plan:

## 2015-02-27 NOTE — Patient Instructions (Addendum)
Welcome to medicare in 4 months  Congrats on improved blood sugar , cholesterol and weight loss  Foot exam today is good  STOP lipitor when you have finished current script, new is simvastatin (zocor) at 5mg  daily  pls commit to daily exercise for 30 mins daily

## 2015-02-27 NOTE — Telephone Encounter (Signed)
Order has been placed. Pt is aware of results. Nothing further was needed.

## 2015-02-27 NOTE — Telephone Encounter (Signed)
Spoke with pt. He is wanting his sleep study results from May. He has pending appt 03/19/14. Also requesting results be sent to [facility] so he can get supplies for his CPAP. Please advise Dr. Annamaria Boots thanks

## 2015-02-27 NOTE — Telephone Encounter (Signed)
Due to insurance he was changing to [facility] for management of CPAP  For dx OSA  His sleep study 01/31/15 does confirm he still needs CPAP. AHI was in the severe range  49.4/ hr.  Please send [facility] order to continue his CPAP:  CPAP 13 cwp, mask of choice, supplies, humidifier, set up AirView for downloads.

## 2015-03-10 DIAGNOSIS — Z23 Encounter for immunization: Secondary | ICD-10-CM | POA: Insufficient documentation

## 2015-03-10 NOTE — Assessment & Plan Note (Signed)
Over  Corrected reduced med dose. Hyperlipidemia:Low fat diet discussed and encouraged.   Lipid Panel  Lab Results  Component Value Date   CHOL 109 02/25/2015   HDL 39* 02/25/2015   LDLCALC 53 02/25/2015   TRIG 86 02/25/2015   CHOLHDL 2.8 02/25/2015  increased exercise to improve HDL

## 2015-03-10 NOTE — Assessment & Plan Note (Signed)
The increased risk of cardiovascular disease associated with this diagnosis, and the need to consistently work on lifestyle to change this is discussed. Following  a  heart healthy diet ,commitment to 30 minutes of exercise at least 5 days per week, as well as control of blood sugar and cholesterol , and achieving a healthy weight are all the areas to be addressed .  

## 2015-03-10 NOTE — Assessment & Plan Note (Signed)
Reports consistent use of CPA machine

## 2015-03-10 NOTE — Assessment & Plan Note (Signed)
No current symptoms , controlled , no change in management

## 2015-03-10 NOTE — Assessment & Plan Note (Signed)
Christopher Lambert is reminded of the importance of commitment to daily physical activity for 30 minutes or more, as able and the need to limit carbohydrate intake to 30 to 60 grams per meal to help with blood sugar control.   The need to take medication as prescribed, test blood sugar as directed, and to call between visits if there is a concern that blood sugar is uncontrolled is also discussed.   Christopher Lambert is reminded of the importance of daily foot exam, annual eye examination, and good blood sugar, blood pressure and cholesterol control. Improved, pt applauded on this  Diabetic Labs Latest Ref Rng 02/25/2015 10/22/2014 06/13/2014 02/01/2014 10/10/2013  HbA1c <5.7 % 6.3(H) 6.8(H) 6.6(H) 6.5(H) -  Microalbumin <2.0 mg/dL - 0.4 - - 0.50  Micro/Creat Ratio 0.0 - 30.0 mg/g - 1.4 - - 4.6  Chol 0 - 200 mg/dL 109 - 117 99 -  HDL >=40 mg/dL 39(L) - 38(L) 34(L) -  Calc LDL 0 - 99 mg/dL 53 - 55 47 -  Triglycerides <150 mg/dL 86 - 121 90 -  Creatinine 0.50 - 1.35 mg/dL 0.99 0.93 1.01 0.99 -   BP/Weight 02/27/2015 01/31/2015 01/23/2015 10/23/2014 06/19/2014 03/19/2014 12/22/7351  Systolic BP 299 - 242 683 419 622 297  Diastolic BP 74 - 76 70 82 86 80  Wt. (Lbs) 292.8 294 297.8 298 296.8 - 297  BMI 44.53 45.34 45.93 46.66 46.47 - 45.8   Foot/eye exam completion dates 02/27/2015  Foot Form Completion Done

## 2015-03-10 NOTE — Assessment & Plan Note (Signed)
Laceration to left 5th finger, open, not infected, TdAP administered as past due

## 2015-03-10 NOTE — Assessment & Plan Note (Signed)
Improved Patient re-educated about  the importance of commitment to a  minimum of 150 minutes of exercise per week.  The importance of healthy food choices with portion control discussed. Encouraged to start a food diary, count calories and to consider  joining a support group. Sample diet sheets offered. Goals set by the patient for the next several months.   Weight /BMI 02/27/2015 01/31/2015 01/23/2015  WEIGHT 292 lb 12.8 oz 294 lb 297 lb 12.8 oz  HEIGHT 5\' 8"  5' 7.5" 5' 7.5"  BMI 44.53 kg/m2 45.34 kg/m2 45.93 kg/m2    Current exercise per week 120 minutes.

## 2015-03-10 NOTE — Assessment & Plan Note (Signed)
Controlled, no change in medication DASH diet and commitment to daily physical activity for a minimum of 30 minutes discussed and encouraged, as a part of hypertension management. The importance of attaining a healthy weight is also discussed.  BP/Weight 02/27/2015 01/31/2015 01/23/2015 10/23/2014 06/19/2014 03/19/2014 1/59/4585  Systolic BP 929 - 244 628 638 177 116  Diastolic BP 74 - 76 70 82 86 80  Wt. (Lbs) 292.8 294 297.8 298 296.8 - 297  BMI 44.53 45.34 45.93 46.66 46.47 - 45.8

## 2015-03-13 LAB — HM DIABETES EYE EXAM

## 2015-03-19 ENCOUNTER — Other Ambulatory Visit: Payer: Self-pay | Admitting: Internal Medicine

## 2015-03-19 DIAGNOSIS — G4733 Obstructive sleep apnea (adult) (pediatric): Secondary | ICD-10-CM

## 2015-03-20 ENCOUNTER — Encounter: Payer: Self-pay | Admitting: Internal Medicine

## 2015-03-20 ENCOUNTER — Ambulatory Visit (INDEPENDENT_AMBULATORY_CARE_PROVIDER_SITE_OTHER): Payer: Medicare Other | Admitting: Internal Medicine

## 2015-03-20 VITALS — BP 136/72 | HR 62 | Ht 67.5 in | Wt 293.4 lb

## 2015-03-20 DIAGNOSIS — G4733 Obstructive sleep apnea (adult) (pediatric): Secondary | ICD-10-CM | POA: Diagnosis not present

## 2015-03-20 DIAGNOSIS — I10 Essential (primary) hypertension: Secondary | ICD-10-CM | POA: Diagnosis not present

## 2015-03-20 NOTE — Progress Notes (Signed)
Patient ID: Christopher Lambert, male    DOB: 1956/01/17, 59 y.o.   MRN: [id number]  HPI 02/13/11- OSA, rhinitis Last here August 20, 2010. Note reviewed. Continues CPAP 16, all night every night, all night. Still occasional yawn and nod. Tried Provigil for residual sleepiness but it caused tachy palpitation. He has not been a coffee drinker.  Mild nasal congestion early Spring, managed otc, but ok now.   08/19/11- 53 yoM never smoker followed for OSA, rhinitis, PAF PCP Dr Tula Nakayama Has had flu vaccine. 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 Continues CPAP at 16 CWP with good compliance and control. Still experiences daytime sleepiness if quiet and will get himself caffeine to help. Has had intervals of sustained tachycardia palpitation at least twice. He describes a sudden onset without pain or stress, gradually easing off. He carries a diagnosis of atrial fib on his problem list.  02/23/12- 59 yoM never smoker followed for OSA, rhinitis,     PCP Dr Jerilynn Mages. [name] Wears CPAP every night for approx 5-6 hours; pressure  working well; was checked by Cardiology [facility]) and given okay on heart. Good with CPAP 16/ [name]. Waiting arrival for new mask. Some dry mouth  03/16/13- 59 yoM never smoker followed for OSA, rhinitis,     PCP Dr Jerilynn Mages. [name] Yearly Follow up.  Wearing cpap16/ [name]  every night for approx 5-6 hours.  Wakes up with dry mouth in the mornings.  Head strap is loosing up.  Dry mouth despite Biotene and fullface mask. Hx bilateral TKR- disabled  CXR 05/18/12 IMPRESSION:  No acute abnormality.  Original Report Authenticated By: Doug Sou, M.D.  03/19/14- 59 yoM never smoker followed for OSA, rhinitis,     PCP Dr Jerilynn Mages. [name] FOLLOWS FOR: Wears CPAP 16/ [name] every night for about 7-8 hours;Pt thinks his humidifier is not working correctly-will contact [name] about this.  Tolerated spring pollen -Flonase  01/23/15- 58 yoM never smoker followed for OSA, rhinitis,     PCP Dr  Jerilynn Mages. [name] FOLLOWS FOR: Due to contract of insurnace with [name]-pt will need to go through [facility] sleep study is too old per [facility]-will need new sleep study to transfer to [facility]. Wears CPAP 16 every night for about 8-9 hours. We discussed transfer to [place] [employer] company to take over management of his CPAP. We should be able to get an unattended study for this documentation\  03/20/15-58 yoM never smoker followed for OSA, rhinitis,     PCP Dr Jerilynn Mages. [name] FOLLOWS FOR: DME is [facility]; pt had newer sleep study as [facility] needed this for insurance purposes. Pt would like copy of sleep study as well. Wears CPAP every night. NPSG 01/31/15- severe OSA AHI 49.4/ hr, CPAP suggested 13, AHI 0.0, weight 294 lbs He still has an old machine needing supplies. He needed documentation with transferred to [facility] so that they can begin providing service. The new sleep study should meet requirement. He has not achieved meaningful weight loss. His responsibility for safe driving again reinforced.  ROS-see HPI Constitutional:   No-   weight loss, night sweats, fevers, chills, fatigue, lassitude. HEENT:   No-  headaches, difficulty swallowing, tooth/dental problems, sore throat,       No-  sneezing, itching, ear ache, nasal congestion, post nasal drip,  CV:  No-   chest pain, orthopnea, PND, swelling in lower extremities, anasarca, dizziness, palpitations Resp: No-   shortness of breath with exertion or at rest.  No-   productive cough,  No non-productive cough,  No- coughing up of blood.              No-   change in color of mucus.  No- wheezing.   Skin: No-   rash or lesions. GI:  No-   heartburn, indigestion, abdominal pain, nausea, vomiting, GU:  MS:  No-   joint pain or swelling.   Neuro-     nothing unusual Psych:  No- change in mood or affect. No depression or anxiety.  No memory loss.  Objective:   Physical Exam General- Alert, Oriented, Affect-appropriate, Distress- none acute,                 +obese Skin- rash-none, lesions- none, excoriation- none Lymphadenopathy- none Head- atraumatic            Eyes- Gross vision intact, PERRLA, conjunctivae clear secretions            Ears- Hearing, canals-normal            Nose- Clear, no-Septal dev, mucus, polyps, erosion, perforation             Throat- Mallampati IV , mucosa clear and it seems normally moist, drainage- none, tonsils- atrophic Neck- flexible , trachea midline, no stridor , thyroid nl, carotid no bruit Chest - symmetrical excursion , unlabored           Heart/CV- RRR , no murmur , no gallop  , no rub, nl s1 s2                           - JVD- none , edema 2+, stasis changes-+, varices- none           Lung- clear to P&A, wheeze- none, cough- none , dullness-none, rub- none           Chest wall-  Abd-  Br/ Gen/ Rectal- Not done, not indicated Extrem- +scars from bilateral TKR Neuro- grossly intact to observation

## 2015-03-20 NOTE — Patient Instructions (Signed)
Order- [facility] replacement for old, worn out CPAP machine, 13 cwp, mask of choice, humidifier, supplies    Dx OSA  Please call as needed

## 2015-03-24 NOTE — Assessment & Plan Note (Signed)
We have spent a fair amount of time interacting between the patient and [facility] to hopefully get him settled in with them for his DME services, replacement for old CPAP machine and supplies. We can start with the most recent pressure of record which we think was 13 and adjust based on download results

## 2015-03-24 NOTE — Assessment & Plan Note (Signed)
Discussed interaction of obstructive sleep apnea with hypertension

## 2015-04-01 ENCOUNTER — Other Ambulatory Visit: Payer: Self-pay | Admitting: Family Medicine

## 2015-07-01 ENCOUNTER — Encounter: Payer: Self-pay | Admitting: Family Medicine

## 2015-07-01 ENCOUNTER — Other Ambulatory Visit: Payer: Self-pay | Admitting: Family Medicine

## 2015-07-01 ENCOUNTER — Ambulatory Visit: Payer: Medicare Other | Admitting: Internal Medicine

## 2015-07-01 ENCOUNTER — Ambulatory Visit (INDEPENDENT_AMBULATORY_CARE_PROVIDER_SITE_OTHER): Payer: Medicare Other | Admitting: Family Medicine

## 2015-07-01 VITALS — BP 120/84 | HR 85 | Resp 16 | Ht 67.0 in | Wt 293.4 lb

## 2015-07-01 DIAGNOSIS — Z0000 Encounter for general adult medical examination without abnormal findings: Secondary | ICD-10-CM

## 2015-07-01 DIAGNOSIS — E1169 Type 2 diabetes mellitus with other specified complication: Secondary | ICD-10-CM

## 2015-07-01 DIAGNOSIS — E785 Hyperlipidemia, unspecified: Secondary | ICD-10-CM

## 2015-07-01 DIAGNOSIS — R319 Hematuria, unspecified: Secondary | ICD-10-CM

## 2015-07-01 DIAGNOSIS — R001 Bradycardia, unspecified: Secondary | ICD-10-CM

## 2015-07-01 DIAGNOSIS — Z23 Encounter for immunization: Secondary | ICD-10-CM

## 2015-07-01 LAB — POCT URINALYSIS DIPSTICK
BILIRUBIN UA: NEGATIVE
Glucose, UA: NEGATIVE
Ketones, UA: NEGATIVE
LEUKOCYTES UA: NEGATIVE
NITRITE UA: NEGATIVE
PH UA: 6
Protein, UA: NEGATIVE
Spec Grav, UA: 1.02
UROBILINOGEN UA: 0.2

## 2015-07-01 MED ORDER — SIMVASTATIN 5 MG PO TABS: 5.0000 mg | ORAL_TABLET | Freq: Every day | ORAL | Status: DC

## 2015-07-01 NOTE — Patient Instructions (Signed)
Annual physical exam end Feb, call if you need me before  Flu vaccine today  Please work on good  health habits so that your health will improve. 1. Commitment to daily physical activity for 30 to 60  minutes, if you are able to do this.  2. Commitment to wise food choices. Aim for half of your  food intake to be vegetable and fruit, one quarter starchy foods, and one quarter protein. Try to eat on a regular schedule  3 meals per day, snacking between meals should be limited to vegetables or fruits or small portions of nuts. 64 ounces of water per day is generally recommended, unless you have specific health conditions, like heart failure or kidney failure where you will need to limit fluid intake.  3. Commitment to sufficient and a  good quality of physical and mental rest daily, generally between 6 to 8 hours per day.  WITH PERSISTANCE AND PERSEVERANCE, THE IMPOSSIBLE , BECOMES THE NORM!  Thanks for choosing [facility], we consider it a privelige to serve you.

## 2015-07-01 NOTE — Progress Notes (Signed)
Subjective:    Patient ID: Christopher Lambert, male    DOB: August 16, 1956, 59 y.o.   MRN: [id number]  HPI  Preventive Screening-Counseling & Management   Patient present here today for a welcome to medicare l wellness visit.   Current Problems (verified)   Medications Prior to Visit Allergies (verified)   PAST HISTORY  Family History (verified)   Social History  Married for 30 plus years, no children, patient disabled x 5 years. Never smoker    Risk Factors  Current exercise habits: walks 3 days a week, cuts wood and gets physical activitiy working outdoors     Dietary issues discussed: Low fat, low carb    Cardiac risk factors: Type 2 diabetes   Depression Screen  (Note: if answer to either of the following is "Yes", a more complete depression screening is indicated)   Over the past two weeks, have you felt down, depressed or hopeless? No  Over the past two weeks, have you felt little interest or pleasure in doing things? No  Have you lost interest or pleasure in daily life? No  Do you often feel hopeless? No  Do you cry easily over simple problems? No   Activities of Daily Living  In your present state of health, do you have any difficulty performing the following activities?  Driving?: No Managing money?: No Feeding yourself?:No Getting from bed to chair?:No Climbing a flight of stairs?:No Preparing food and eating?:No Bathing or showering?:No Getting dressed?:No Getting to the toilet?:No Using the toilet?:No Moving around from place to place?: No  Fall Risk Assessment In the past year have you fallen or had a near fall?:No Are you currently taking any medications that make you dizzy?:No   Hearing Difficulties: No Do you often ask people to speak up or repeat themselves?:No Do you experience ringing or noises in your ears?:No Do you have difficulty understanding soft or whispered voices?:No  Cognitive Testing  Alert? Yes Normal Appearance?Yes  Oriented to  person? Yes Place? Yes  Time? Yes  Displays appropriate judgment?Yes  Can read the correct time from a watch face? yes Are you having problems remembering things?No  Advanced Directives have been discussed with the patient?Yes, brochure given    List the Names of Other Physician/Practitioners you currently use:  Dr Annamaria Boots (pulmonary )  Dr Percell Miller (ortho)   Indicate any recent Medical Services you may have received from other than [facility] providers in the past year (date may be approximate).   Assessment:    Annual Wellness Exam   Plan:     Medicare Attestation  I have personally reviewed:  The patient's medical and social history  Their use of alcohol, tobacco or illicit drugs  Their current medications and supplements  The patient's functional ability including ADLs,fall risks, home safety risks, cognitive, and hearing and visual impairment  Diet and physical activities  Evidence for depression or mood disorders  The patient's weight, height, BMI, and visual acuity have been recorded in the chart. I have made referrals, counseling, and provided education to the patient based on review of the above and I have provided the patient with a written personalized care plan for preventive services.     Review of Systems     Objective:   Physical Exam BP 120/84 mmHg  Pulse 85  Resp 16  Ht 5\' 7"  (1.702 m)  Wt 293 lb 6.4 oz (133.085 kg)  BMI 45.94 kg/m2  SpO2 97%  Chest: Clear to ascultation bilaterally EKG: sinus  bradycardia, no ischemic changes UA, positive for blood      Assessment & Plan:  Welcome to Medicare preventive visit Annual exam as documented. Counseling done  re healthy lifestyle involving commitment to 150 minutes exercise per week, heart healthy diet, and attaining healthy weight.The importance of adequate sleep also discussed. Regular seat belt use and home safety, is also discussed. Changes in health habits are decided on by the patient with goals and time  frames  set for achieving them. Immunization and cancer screening needs are specifically addressed at this visit.   Need for influenza vaccination After obtaining informed consent, the vaccine is  administered by LPN.   Hematuria Painless hematuria, needs to see urology for eval and management   Sinus bradycardia on ECG Improved rates since last test approx  4 years ago, and asymptomatic, no med change , healthy habits with a weight loss establishe  Morbid obesity Unchanged. Patient re-educated about  the importance of commitment to a  minimum of 150 minutes of exercise per week.  The importance of healthy food choices with portion control discussed. Encouraged to start a food diary, count calories and to consider  joining a support group. Sample diet sheets offered. Goals set by the patient for the next several months.   Weight /BMI 07/01/2015 03/20/2015 02/27/2015  WEIGHT 293 lb 6.4 oz 293 lb 6.4 oz 292 lb 12.8 oz  HEIGHT 5\' 7"  5' 7.5" 5\' 8"   BMI 45.94 kg/m2 45.25 kg/m2 44.53 kg/m2    Current exercise per week 60 minutes.

## 2015-07-02 DIAGNOSIS — R001 Bradycardia, unspecified: Secondary | ICD-10-CM | POA: Insufficient documentation

## 2015-07-02 DIAGNOSIS — Z23 Encounter for immunization: Secondary | ICD-10-CM | POA: Insufficient documentation

## 2015-07-02 DIAGNOSIS — R319 Hematuria, unspecified: Secondary | ICD-10-CM | POA: Insufficient documentation

## 2015-07-02 DIAGNOSIS — Z0000 Encounter for general adult medical examination without abnormal findings: Secondary | ICD-10-CM | POA: Insufficient documentation

## 2015-07-02 LAB — HIV ANTIBODY (ROUTINE TESTING W REFLEX): HIV 1&2 Ab, 4th Generation: NONREACTIVE

## 2015-07-02 LAB — COMPLETE METABOLIC PANEL WITH GFR
ALT: 22 U/L (ref 9–46)
AST: 16 U/L (ref 10–35)
Albumin: 3.9 g/dL (ref 3.6–5.1)
Alkaline Phosphatase: 35 U/L — ABNORMAL LOW (ref 40–115)
BUN: 14 mg/dL (ref 7–25)
CALCIUM: 8.7 mg/dL (ref 8.6–10.3)
CO2: 32 mmol/L — AB (ref 20–31)
CREATININE: 1.08 mg/dL (ref 0.70–1.33)
Chloride: 99 mmol/L (ref 98–110)
GFR, EST AFRICAN AMERICAN: 87 mL/min (ref >=60)
GFR, Est Non African American: 75 mL/min (ref >=60)
Glucose, Bld: 127 mg/dL — ABNORMAL HIGH (ref 65–99)
Potassium: 3.6 mmol/L (ref 3.5–5.3)
Sodium: 138 mmol/L (ref 135–146)
TOTAL PROTEIN: 7.3 g/dL (ref 6.1–8.1)
Total Bilirubin: 1 mg/dL (ref 0.2–1.2)

## 2015-07-02 LAB — PSA, MEDICARE: PSA: 3.14 ng/mL (ref <=4.00)

## 2015-07-02 LAB — HEPATITIS C ANTIBODY: HCV Ab: NEGATIVE

## 2015-07-02 NOTE — Addendum Note (Signed)
Addended by: Denman George B on: 07/02/2015 09:23 AM   Modules accepted: Orders

## 2015-07-02 NOTE — Assessment & Plan Note (Signed)
After obtaining informed consent, the vaccine is  administered by LPN.  

## 2015-07-02 NOTE — Assessment & Plan Note (Signed)
Improved rates since last test approx  4 years ago, and asymptomatic, no med change , healthy habits with a weight loss establishe

## 2015-07-02 NOTE — Assessment & Plan Note (Signed)

## 2015-07-02 NOTE — Assessment & Plan Note (Addendum)
Unchanged. Patient re-educated about  the importance of commitment to a  minimum of 150 minutes of exercise per week.  The importance of healthy food choices with portion control discussed. Encouraged to start a food diary, count calories and to consider  joining a support group. Sample diet sheets offered. Goals set by the patient for the next several months.   Weight /BMI 07/01/2015 03/20/2015 02/27/2015  WEIGHT 293 lb 6.4 oz 293 lb 6.4 oz 292 lb 12.8 oz  HEIGHT 5\' 7"  5' 7.5" 5\' 8"   BMI 45.94 kg/m2 45.25 kg/m2 44.53 kg/m2    Current exercise per week 60 minutes.

## 2015-07-02 NOTE — Assessment & Plan Note (Signed)
Painless hematuria, needs to see urology for eval and management

## 2015-07-16 ENCOUNTER — Encounter: Payer: Self-pay | Admitting: Family Medicine

## 2015-07-17 ENCOUNTER — Telehealth: Payer: Self-pay | Admitting: Internal Medicine

## 2015-07-17 DIAGNOSIS — G4733 Obstructive sleep apnea (adult) (pediatric): Secondary | ICD-10-CM

## 2015-07-17 NOTE — Telephone Encounter (Signed)
Spoke with pt, states that Christopher Lambert at [facility] called pt today saying that they need ov notes, sleep study, and other info from our office.   lmtcb X1 for Christopher Lambert at [facility] at the below #.

## 2015-07-18 NOTE — Telephone Encounter (Signed)
lmtcb x2 for Christopher Lambert.

## 2015-07-19 NOTE — Telephone Encounter (Signed)
lmtcb for tina w/ [facility]

## 2015-07-22 NOTE — Telephone Encounter (Signed)
Left message for Otila Kluver with [facility] to call back.

## 2015-07-22 NOTE — Telephone Encounter (Signed)
Spoke with pt. States that he needs a new CPAP machine order sent to [facility]. The last OV note and his last sleep study will need to be sent along with that. Order will be placed. Nothing further was needed.

## 2015-07-22 NOTE — Telephone Encounter (Signed)
Pt has called [facility] and spoke to [place]. They state we have not tried to call them.  Pt is willing to come by and pick up a rx or whatever is needed.  [phone] or call [phone]

## 2015-10-09 ENCOUNTER — Ambulatory Visit (INDEPENDENT_AMBULATORY_CARE_PROVIDER_SITE_OTHER): Payer: Medicare Other | Admitting: Internal Medicine

## 2015-10-09 ENCOUNTER — Encounter: Payer: Self-pay | Admitting: Internal Medicine

## 2015-10-09 VITALS — BP 138/88 | HR 69 | Ht 68.25 in | Wt 304.4 lb

## 2015-10-09 DIAGNOSIS — G4733 Obstructive sleep apnea (adult) (pediatric): Secondary | ICD-10-CM

## 2015-10-09 NOTE — Progress Notes (Signed)
Patient ID: Christopher Lambert, male    DOB: 17-Apr-1956, 60 y.o.   MRN: [id number]  HPI 02/13/11- OSA, rhinitis Last here August 20, 2010. Note reviewed. Continues CPAP 16, all night every night, all night. Still occasional yawn and nod. Tried Provigil for residual sleepiness but it caused tachy palpitation. He has not been a coffee drinker.  Mild nasal congestion early Spring, managed otc, but ok now.   08/19/11- 51 yoM never smoker followed for OSA, rhinitis, PAF PCP Dr Tula Nakayama Has had flu vaccine. 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 Continues CPAP at 16 CWP with good compliance and control. Still experiences daytime sleepiness if quiet and will get himself caffeine to help. Has had intervals of sustained tachycardia palpitation at least twice. He describes a sudden onset without pain or stress, gradually easing off. He carries a diagnosis of atrial fib on his problem list.  02/23/12- 55 yoM never smoker followed for OSA, rhinitis,     PCP Dr Jerilynn Mages. [name] Wears CPAP every night for approx 5-6 hours; pressure  working well; was checked by Cardiology [facility]) and given okay on heart. Good with CPAP 16/ [name]. Waiting arrival for new mask. Some dry mouth  03/16/13- 57 yoM never smoker followed for OSA, rhinitis,     PCP Dr Jerilynn Mages. [name] Yearly Follow up.  Wearing cpap16/ [name]  every night for approx 5-6 hours.  Wakes up with dry mouth in the mornings.  Head strap is loosing up.  Dry mouth despite Biotene and fullface mask. Hx bilateral TKR- disabled  CXR 05/18/12 IMPRESSION:  No acute abnormality.  Original Report Authenticated By: Christopher Lambert, M.D.  03/19/14- 80 yoM never smoker followed for OSA, rhinitis,     PCP Dr Jerilynn Mages. [name] FOLLOWS FOR: Wears CPAP 16/ Christopher Lambert every night for about 7-8 hours;Pt thinks his humidifier is not working correctly-will contact Christopher Lambert about this.  Tolerated spring pollen -Flonase  01/23/15- 58 yoM never smoker followed for OSA, rhinitis,     PCP Dr  Jerilynn Mages. [name] FOLLOWS FOR: Due to contract of insurnace with [name]-pt will need to go through [facility] sleep study is too old per [facility]-will need new sleep study to transfer to [facility]. Wears CPAP 16 every night for about 8-9 hours. We discussed transfer to [place] [facility] company to take over management of his CPAP. We should be able to get an unattended study for this documentation\  03/20/15-58 yoM never smoker followed for OSA, rhinitis,     PCP Dr Jerilynn Mages. [name] FOLLOWS FOR: DME is [facility]; pt had newer sleep study as [facility] needed this for insurance purposes. Pt would like copy of sleep study as well. Wears CPAP every night. NPSG 01/31/15- severe OSA AHI 49.4/ hr, CPAP suggested 13, AHI 0.0, weight 294 lbs He still has an old machine needing supplies. He needed documentation with transferred to [facility] so that they can begin providing service. The new sleep study should meet requirement. He has not achieved meaningful weight loss. His responsibility for safe driving again reinforced.  10/09/2015-60 year old male never smoker followed for OSA, rhinitis CPAP 16/ [facility] FOLLOW FOR: Sleep Apnea.  Patient using a loaner machine from [facility] that has name of another patient on SD card, however, patient states that he is the only one that has been using it.  Patient wears CPAP machine every night.  Patient still waiting for his machine to be replaced. The download sheet provided had a different person's name on it, a pressure of  14 instead of the 16 we have record of using, and very poor control of AHI despite excellent compliance. Not sure it is his information. It may have been left on the card from previous user. He admits he snores a little at times but uses CPAP every night and sleeps well with it. He is still trying to work out how to finance new CPAP machine.  ROS-see HPI Constitutional:   No-   weight loss, night sweats, fevers, chills, fatigue, lassitude. HEENT:   No-  headaches, difficulty  swallowing, tooth/dental problems, sore throat,       No-  sneezing, itching, ear ache, nasal congestion, post nasal drip,  CV:  No-   chest pain, orthopnea, PND, swelling in lower extremities, anasarca, dizziness, palpitations Resp: No-   shortness of breath with exertion or at rest.              No-   productive cough,  No non-productive cough,  No- coughing up of blood.              No-   change in color of mucus.  No- wheezing.   Skin: No-   rash or lesions. GI:  No-   heartburn, indigestion, abdominal pain, nausea, vomiting, GU:  MS:  No-   joint pain or swelling.   Neuro-     nothing unusual Psych:  No- change in mood or affect. No depression or anxiety.  No memory loss.  Objective:   Physical Exam General- Alert, Oriented, Affect-appropriate, Distress- none acute, +obese Skin- rash-none, lesions- none, excoriation- none Lymphadenopathy- none Head- atraumatic            Eyes- Gross vision intact, PERRLA, conjunctivae clear secretions            Ears- Hearing, canals-normal            Nose- Clear, no-Septal dev, mucus, polyps, erosion, perforation             Throat- Mallampati IV , mucosa clear and it seems normally moist, drainage- none, tonsils- atrophic Neck- flexible , trachea midline, no stridor , thyroid nl, carotid no bruit Chest - symmetrical excursion , unlabored           Heart/CV- RRR , no murmur , no gallop  , no rub, nl s1 s2                           - JVD- none , edema 1- 2+, stasis changes-+, varices- none           Lung- clear to P&A, wheeze- none, cough- none , dullness-none, rub- none           Chest wall-  Abd-  Br/ Gen/ Rectal- Not done, not indicated Extrem- +scars from bilateral TKR Neuro- grossly intact to observation

## 2015-10-09 NOTE — Assessment & Plan Note (Signed)
Discrepancy between the information on the download sheet provided from his SD card in the loaner machine he has been using Plan-we will ask his DME company to help [place] clarify settings and download status. Continue encouraging his efforts to finances new machine.

## 2015-10-09 NOTE — Patient Instructions (Signed)
Order- [facility]- please change CPAP pressure to 16. Note that download from [employer] had a different patient's name on it. Please verify and send [place] current download for him.  Please call as needed

## 2015-10-09 NOTE — Assessment & Plan Note (Signed)
He has not been prepared to make lifestyle changes required to lose significant weight. We again discussed the impact of morbid obesity on his sleep apnea and his diabetes

## 2015-11-11 ENCOUNTER — Encounter: Payer: Self-pay | Admitting: Family Medicine

## 2015-11-11 ENCOUNTER — Ambulatory Visit (INDEPENDENT_AMBULATORY_CARE_PROVIDER_SITE_OTHER): Payer: Medicare Other | Admitting: Family Medicine

## 2015-11-11 VITALS — BP 124/84 | HR 66 | Resp 18 | Ht 67.0 in | Wt 300.1 lb

## 2015-11-11 DIAGNOSIS — E785 Hyperlipidemia, unspecified: Secondary | ICD-10-CM | POA: Diagnosis not present

## 2015-11-11 DIAGNOSIS — E119 Type 2 diabetes mellitus without complications: Secondary | ICD-10-CM

## 2015-11-11 DIAGNOSIS — Z125 Encounter for screening for malignant neoplasm of prostate: Secondary | ICD-10-CM

## 2015-11-11 DIAGNOSIS — E669 Obesity, unspecified: Secondary | ICD-10-CM

## 2015-11-11 DIAGNOSIS — I10 Essential (primary) hypertension: Secondary | ICD-10-CM

## 2015-11-11 DIAGNOSIS — Z0000 Encounter for general adult medical examination without abnormal findings: Secondary | ICD-10-CM | POA: Diagnosis not present

## 2015-11-11 DIAGNOSIS — Z1211 Encounter for screening for malignant neoplasm of colon: Secondary | ICD-10-CM | POA: Diagnosis not present

## 2015-11-11 DIAGNOSIS — E1169 Type 2 diabetes mellitus with other specified complication: Secondary | ICD-10-CM

## 2015-11-11 LAB — CBC
HCT: 42.2 % (ref 39.0–52.0)
Hemoglobin: 14.7 g/dL (ref 13.0–17.0)
MCH: 31.1 pg (ref 26.0–34.0)
MCHC: 34.8 g/dL (ref 30.0–36.0)
MCV: 89.4 fL (ref 78.0–100.0)
MPV: 9.1 fL (ref 8.6–12.4)
PLATELETS: 226 10*3/uL (ref 150–400)
RBC: 4.72 MIL/uL (ref 4.22–5.81)
RDW: 13.7 % (ref 11.5–15.5)
WBC: 3.4 10*3/uL — AB (ref 4.0–10.5)

## 2015-11-11 LAB — COMPREHENSIVE METABOLIC PANEL
ALBUMIN: 4 g/dL (ref 3.6–5.1)
ALT: 21 U/L (ref 9–46)
AST: 17 U/L (ref 10–35)
Alkaline Phosphatase: 32 U/L — ABNORMAL LOW (ref 40–115)
BUN: 11 mg/dL (ref 7–25)
CALCIUM: 8.9 mg/dL (ref 8.6–10.3)
CHLORIDE: 100 mmol/L (ref 98–110)
CO2: 30 mmol/L (ref 20–31)
Creat: 1.01 mg/dL (ref 0.70–1.33)
GLUCOSE: 125 mg/dL — AB (ref 65–99)
Potassium: 3.7 mmol/L (ref 3.5–5.3)
SODIUM: 139 mmol/L (ref 135–146)
Total Bilirubin: 0.6 mg/dL (ref 0.2–1.2)
Total Protein: 7.1 g/dL (ref 6.1–8.1)

## 2015-11-11 LAB — POC HEMOCCULT BLD/STL (OFFICE/1-CARD/DIAGNOSTIC): FECAL OCCULT BLD: NEGATIVE

## 2015-11-11 LAB — LIPID PANEL
CHOLESTEROL: 124 mg/dL — AB (ref 125–200)
HDL: 35 mg/dL — AB (ref >=40)
LDL Cholesterol: 69 mg/dL (ref <130)
TRIGLYCERIDES: 102 mg/dL (ref <150)
Total CHOL/HDL Ratio: 3.5 Ratio (ref <=5.0)
VLDL: 20 mg/dL (ref <30)

## 2015-11-11 LAB — TSH: TSH: 1.47 mIU/L (ref 0.40–4.50)

## 2015-11-11 MED ORDER — FLUTICASONE PROPIONATE 50 MCG/ACT NA SUSP: 2.0000 | Freq: Two times a day (BID) | NASAL | Status: DC

## 2015-11-11 NOTE — Assessment & Plan Note (Signed)
Annual exam as documented. Counseling done  re healthy lifestyle involving commitment to 150 minutes exercise per week, heart healthy diet, and attaining healthy weight.The importance of adequate sleep also discussed. Changes in health habits are decided on by the patient with goals and time frames  set for achieving them. Immunization and cancer screening needs are specifically addressed at this visit. 

## 2015-11-11 NOTE — Progress Notes (Signed)
   Subjective:    Patient ID: Christopher Lambert, male    DOB: [birth year], 60 y.o.   MRN: [id number]  HPI Patient is in for annual physical exam. No other health concerns are expressed or addressed at the visit. Recent labs, e are reviewed. Immunization is reviewed , and  updated if needed.   Review of Systems See HPI     Objective:   Physical Exam  BP 124/84 mmHg  Pulse 66  Resp 18  Ht 5\' 7"  (1.702 m)  Wt 300 lb 1.9 oz (136.134 kg)  BMI 46.99 kg/m2  SpO2 99%  Pleasant obese male, alert and oriented x 3, in no cardio-pulmonary distress. Afebrile. HEENT No facial trauma or asymetry. Sinuses non tender. EOMI, PERTL External ears normal, tympanic membranes clear. Oropharynx moist, no exudate,fairly  good dentition. Neck: supple, no adenopathy,JVD or thyromegaly.No bruits.  Chest: Clear to ascultation bilaterally.No crackles or wheezes. Non tender to palpation  Breast: No asymetry,no masses. No nipple discharge or inversion. No axillary or supraclavicular adenopathy  Cardiovascular system; Heart sounds normal,  S1 and  S2 ,no S3.  No murmur, or thrill. Apical beat not displaced Peripheral pulses normal.  Abdomen: Soft, non tender, no organomegaly or masses. No bruits. Bowel sounds normal. No guarding, tenderness or rebound.  Rectal:  Normal sphincter tone. No hemorrhoids or  masses. guaiac negative stool. Prostate smooth and firm  GU: Not examined  Musculoskeletal exam: Full ROM of spine, hips , shoulders and knees. No deformity ,swelling or crepitus noted. No muscle wasting or atrophy.   Neurologic: Cranial nerves 2 to 12 intact. Power, tone ,sensation and reflexes normal throughout. No disturbance in gait. No tremor.  Skin: Intact, tinea pedis Pigmentation normal throughout  Psych; Normal mood and affect. Judgement and concentration normal       Assessment & Plan:  Annual physical exam Annual exam as documented. Counseling done  re healthy  lifestyle involving commitment to 150 minutes exercise per week, heart healthy diet, and attaining healthy weight.The importance of adequate sleep also discussed.  Changes in health habits are decided on by the patient with goals and time frames  set for achieving them. Immunization and cancer screening needs are specifically addressed at this visit.

## 2015-11-11 NOTE — Patient Instructions (Signed)
Annual wellness 10/18 or after , call if you need me sooner  Call and come for flu vaccine in early September  Fasting labs today   Please work on, losing weight with lifestyle change, this is very important  Eye exam due in June, pls schedule  No med changes   Exam today is good

## 2015-11-12 LAB — HEMOGLOBIN A1C
HEMOGLOBIN A1C: 6.8 % — AB (ref <5.7)
Mean Plasma Glucose: 148 mg/dL — ABNORMAL HIGH (ref <117)

## 2015-11-13 ENCOUNTER — Telehealth: Payer: Self-pay | Admitting: Internal Medicine

## 2015-11-13 NOTE — Telephone Encounter (Signed)
Spoke with pt, needs to see CY by the end of the month per his insurance d/t cpap guidelines.  Pt scheduled Friday at 10:30.  Nothing further needed.

## 2015-11-14 NOTE — Addendum Note (Signed)
Addended by: Eual Fines on: 11/14/2015 03:28 PM   Modules accepted: Orders

## 2015-11-15 ENCOUNTER — Encounter: Payer: Self-pay | Admitting: Internal Medicine

## 2015-11-15 ENCOUNTER — Ambulatory Visit (INDEPENDENT_AMBULATORY_CARE_PROVIDER_SITE_OTHER): Payer: Medicare Other | Admitting: Internal Medicine

## 2015-11-15 VITALS — BP 126/84 | HR 66 | Ht 68.25 in | Wt 306.8 lb

## 2015-11-15 DIAGNOSIS — G4733 Obstructive sleep apnea (adult) (pediatric): Secondary | ICD-10-CM

## 2015-11-15 DIAGNOSIS — J302 Other seasonal allergic rhinitis: Secondary | ICD-10-CM | POA: Diagnosis not present

## 2015-11-15 NOTE — Patient Instructions (Signed)
We agreed to continue CPAP 16 for now. If we want, we can try a little higher pressure sometime in the future.  Keep up the good work !  Please call if we can help

## 2015-11-15 NOTE — Assessment & Plan Note (Signed)
Plan-he will use Flonase and antihistamines. If these are insufficient he will let us know.

## 2015-11-15 NOTE — Assessment & Plan Note (Signed)
He has not accomplished weight loss

## 2015-11-15 NOTE — Assessment & Plan Note (Signed)
Very good compliance and patient comfort with improved quality of life using CPAP. We can increase pressure if needed.

## 2015-11-15 NOTE — Progress Notes (Signed)
Patient ID: Christopher Lambert, male    DOB: [birth year], 60 y.o.   MRN: [id number]  HPI 02/13/11- OSA, rhinitis Last here August 20, 2010. Note reviewed. Continues CPAP 16, all night every night, all night. Still occasional yawn and nod. Tried Provigil for residual sleepiness but it caused tachy palpitation. He has not been a coffee drinker.  Mild nasal congestion early Spring, managed otc, but ok now.   08/19/11- 58 yoM never smoker followed for OSA, rhinitis, PAF PCP Dr Tula Nakayama Has had flu vaccine. 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 Continues CPAP at 16 CWP with good compliance and control. Still experiences daytime sleepiness if quiet and will get himself caffeine to help. Has had intervals of sustained tachycardia palpitation at least twice. He describes a sudden onset without pain or stress, gradually easing off. He carries a diagnosis of atrial fib on his problem list.  02/23/12- 55 yoM never smoker followed for OSA, rhinitis,     PCP Dr Jerilynn Mages. [name] Wears CPAP every night for approx 5-6 hours; pressure  working well; was checked by [facility]) and given okay on heart. Good with CPAP 16/ [name]. Waiting arrival for new mask. Some dry mouth  03/16/13- 57 yoM never smoker followed for OSA, rhinitis,     PCP Dr Jerilynn Mages. [name] Yearly Follow up.  Wearing cpap16/ [name]  every night for approx 5-6 hours.  Wakes up with dry mouth in the mornings.  Head strap is loosing up.  Dry mouth despite Biotene and fullface mask. Hx bilateral TKR- disabled  CXR 05/18/12 IMPRESSION:  No acute abnormality.  Original Report Authenticated By: Doug Sou, M.D.  03/19/14- 74 yoM never smoker followed for OSA, rhinitis,     PCP Dr Jerilynn Mages. [name] FOLLOWS FOR: Wears CPAP 16/ [name] every night for about 7-8 hours;Pt thinks his humidifier is not working correctly-will contact [name] about this.  Tolerated spring pollen -Flonase  01/23/15- 58 yoM never smoker followed for OSA, rhinitis,     PCP Dr  Jerilynn Mages. [name] FOLLOWS FOR: Due to contract of insurnace with [name]-pt will need to go through [facility] sleep study is too old per [facility]-will need new sleep study to transfer to [facility]. Wears CPAP 16 every night for about 8-9 hours. We discussed transfer to [facility] company to take over management of his CPAP. We should be able to get an unattended study for this documentation\  03/20/15-58 yoM never smoker followed for OSA, rhinitis,     PCP Dr Jerilynn Mages. [name] FOLLOWS FOR: DME is [facility]; pt had newer sleep study as [facility] needed this for insurance purposes. Pt would like copy of sleep study as well. Wears CPAP every night. NPSG 01/31/15- severe OSA AHI 49.4/ hr, CPAP suggested 13, AHI 0.0, weight 294 lbs He still has an old machine needing supplies. He needed documentation with transferred to [facility] so that they can begin providing service. The new sleep study should meet requirement. He has not achieved meaningful weight loss. His responsibility for safe driving again reinforced.  10/09/2015-60 year old male never smoker followed for OSA, rhinitis CPAP 16/ [facility] FOLLOW FOR: Sleep Apnea.  Patient using a loaner machine from [facility] that has name of another patient on SD card, however, patient states that he is the only one that has been using it.  Patient wears CPAP machine every night.  Patient still waiting for his machine to be replaced. The download sheet provided had a different person's name on it, a pressure of  14 instead of the 16 we have record of using, and very poor control of AHI despite excellent compliance. Not sure it is his information. It may have been left on the card from previous user. He admits he snores a little at times but uses CPAP every night and sleeps well with it. He is still trying to work out how to finance new CPAP machine.  11/15/2015-60 year old male never smoker followed for OSA, rhinitis CPAP 16/ [facility] FOLLOWS FOR: Wears CPAP nightly. Denies problems with  mask/pressure. Reports good tolerance and sleeping through the night.  Very happy with his CPAP. Download confirms excellent compliance at 16. Residual AHI is a little high at 8.5 but this is unlikely to be medically important. We discussed option to increase his pressure but he would prefer to stay where he is comfortable. Also encourage weight loss. Some spring pollen rhinitis. He has Flonase and can add an antihistamine as needed.  ROS-see HPI Constitutional:   No-   weight loss, night sweats, fevers, chills, fatigue, lassitude. HEENT:   No-  headaches, difficulty swallowing, tooth/dental problems, sore throat,       No-  sneezing, itching, ear ache, nasal congestion, post nasal drip,  CV:  No-   chest pain, orthopnea, PND, swelling in lower extremities, anasarca, dizziness, palpitations Resp: No-   shortness of breath with exertion or at rest.              No-   productive cough,  No non-productive cough,  No- coughing up of blood.              No-   change in color of mucus.  No- wheezing.   Skin: No-   rash or lesions. GI:  No-   heartburn, indigestion, abdominal pain, nausea, vomiting, GU:  MS:  No-   joint pain or swelling.   Neuro-     nothing unusual Psych:  No- change in mood or affect. No depression or anxiety.  No memory loss.  Objective:   Physical Exam General- Alert, Oriented, Affect-appropriate, Distress- none acute, +obese Skin- rash-none, lesions- none, excoriation- none Lymphadenopathy- none Head- atraumatic            Eyes- Gross vision intact, PERRLA, conjunctivae clear secretions            Ears- Hearing, canals-normal            Nose- Clear, no-Septal dev, mucus, polyps, erosion, perforation             Throat- Mallampati IV , mucosa clear and it seems normally moist, drainage- none, tonsils- atrophic Neck- flexible , trachea midline, no stridor , thyroid nl, carotid no bruit Chest - symmetrical excursion , unlabored           Heart/CV- RRR , no murmur , no  gallop  , no rub, nl s1 s2                           - JVD- none , edema 1- 2+, stasis changes-+, varices- none           Lung- clear to P&A, wheeze- none, cough- none , dullness-none, rub- none           Chest wall-  Abd-  Br/ Gen/ Rectal- Not done, not indicated Extrem- +scars from bilateral TKR Neuro- grossly intact to observation

## 2015-11-20 LAB — HM DIABETES EYE EXAM

## 2015-11-26 ENCOUNTER — Encounter: Payer: Self-pay | Admitting: Internal Medicine

## 2015-12-02 ENCOUNTER — Encounter: Payer: Self-pay | Admitting: Internal Medicine

## 2015-12-30 ENCOUNTER — Other Ambulatory Visit: Payer: Self-pay | Admitting: Family Medicine

## 2016-04-07 ENCOUNTER — Ambulatory Visit: Payer: Medicare Other | Admitting: Internal Medicine

## 2016-06-22 ENCOUNTER — Encounter: Payer: Self-pay | Admitting: Family Medicine

## 2016-06-22 ENCOUNTER — Other Ambulatory Visit (HOSPITAL_COMMUNITY)
Admission: AD | Admit: 2016-06-22 | Discharge: 2016-06-22 | Disposition: A | Discharge status: Home / Self Care | Payer: Medicare Other | Source: Skilled Nursing Facility | Attending: Family Medicine | Admitting: Family Medicine

## 2016-06-22 ENCOUNTER — Ambulatory Visit (INDEPENDENT_AMBULATORY_CARE_PROVIDER_SITE_OTHER): Payer: Medicare Other | Admitting: Family Medicine

## 2016-06-22 VITALS — BP 122/78 | HR 89 | Temp 99.8°F | Ht 68.25 in | Wt 290.1 lb

## 2016-06-22 DIAGNOSIS — R3915 Urgency of urination: Secondary | ICD-10-CM

## 2016-06-22 DIAGNOSIS — N3091 Cystitis, unspecified with hematuria: Secondary | ICD-10-CM

## 2016-06-22 LAB — POC URINALSYSI DIPSTICK (AUTOMATED)
GLUCOSE UA: NEGATIVE
Ketones, UA: NEGATIVE
NITRITE UA: NEGATIVE
Spec Grav, UA: 1.025
Urobilinogen, UA: 1
pH, UA: 6

## 2016-06-22 MED ORDER — CIPROFLOXACIN HCL 500 MG PO TABS: 500.0000 mg | ORAL_TABLET | Freq: Two times a day (BID) | ORAL | 0 refills | Status: DC

## 2016-06-22 NOTE — Patient Instructions (Signed)
Take the antibiotic twice a day Today, take both doses, one now and one at bedtime  You may take tylenol or advil for pain Rest  Call if not improving in a couple of days  If you become worse, high fevers, vomiting - go to the ER

## 2016-06-22 NOTE — Progress Notes (Signed)
Chief Complaint  Patient presents with  . Urinary Tract Infection    x4 days, urgency, mild burning, no appetite, hemorroids   History of hematuria in the past with full urology workup in October last year negative Ct, cystoscopy and eval. No history kidney stone or infection, prostate enlargement of infection, urine tract infections at all. Now here for acute visit for urinary symptoms for 4 days.  Frequency, urgency, dysuria and intermittent hematuria with low back pain and fever.  No nausea or vomiting, but has decreased appetite.  Has not taken temp at home, just feels hot and sweats.    Patient Active Problem List   Diagnosis Date Noted  . Annual physical exam 11/11/2015  . Welcome to Medicare preventive visit 07/02/2015  . Need for influenza vaccination 07/02/2015  . Hematuria 07/02/2015  . Sinus bradycardia on ECG 07/02/2015  . Special screening for malignant neoplasms, colon 10/23/2014  . Piles (hemorrhoids) 06/19/2014  . Dyslipidemia, goal LDL below 100 06/19/2014  . 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 Metabolic syndrome 123456  . Abnormal cardiovascular function study 09/21/2011  . Palpitations 08/24/2011  . Onychomycosis 08/24/2011  . Diabetes mellitus type 2 in obese ([place]) 06/26/2010  . Morbid obesity ([place]) 12/02/2007  . Essential hypertension, benign 11/09/2007  . Seasonal allergic rhinitis 11/09/2007  . Obstructive sleep apnea 11/09/2007    Outpatient Encounter Prescriptions as of 06/22/2016  Medication Sig  . aspirin 81 MG EC tablet TAKE 1 TABLET EVERY DAY  . benazepril-hydrochlorthiazide (LOTENSIN HCT) 20-25 MG tablet TAKE 1 TABLET BY MOUTH EVERY DAY  . cholecalciferol (VITAMIN D) 1000 UNITS tablet Take 1,000 Units by mouth daily.    . fluticasone (FLONASE) 50 MCG/ACT nasal spray Place 2 sprays into both nostrils 2 (two) times daily.  . metFORMIN (GLUCOPHAGE) 500 MG tablet TAKE 1 TABLET BY MOUTH EVERY DAY WITH BREAKFAST  . NIFEdipine  (PROCARDIA XL/ADALAT-CC) 90 MG 24 hr tablet TAKE 1 TABLET BY MOUTH EVERY DAY  . simvastatin (ZOCOR) 5 MG tablet TAKE 1 TABLET BY MOUTH AT BEDTIME  . ciprofloxacin (CIPRO) 500 MG tablet Take 1 tablet (500 mg total) by mouth 2 (two) times daily.   No facility-administered encounter medications on file as of 06/22/2016.     No Known Allergies  Review of Systems  Constitutional: Positive for activity change, appetite change, chills and fever. Negative for unexpected weight change.  HENT: Negative for congestion, rhinorrhea and sore throat.   Eyes: Negative for redness and visual disturbance.  Respiratory: Negative.  Negative for cough and shortness of breath.   Cardiovascular: Negative.  Negative for chest pain, palpitations and leg swelling.  Gastrointestinal: Positive for blood in stool. Negative for abdominal pain, diarrhea, nausea and vomiting.       Hemorrhoids flared up  Genitourinary: Positive for dysuria, frequency, hematuria and urgency. Negative for discharge and flank pain.  Musculoskeletal: Positive for arthralgias and back pain.       Chronic per patient  Skin: Negative.  Negative for rash.  Neurological: Negative.   Psychiatric/Behavioral: Negative.     BP 122/78 (BP Location: Left Arm, Patient Position: Sitting, Cuff Size: Large)   Pulse 89   Temp 99.8 F (37.7 C) (Oral)   Ht 5' 8.25" (1.734 m)   Wt 290 lb 1.9 oz (131.6 kg)   SpO2 97%   BMI 43.79 kg/m   Physical Exam  Constitutional: He is oriented to person, place, and time. He appears well-developed and well-nourished.  Pleasant and  cooperative.  Obese.  Moves slowly  HENT:  Head: Normocephalic and atraumatic.  Right Ear: External ear normal.  Left Ear: External ear normal.  Mouth/Throat: Oropharynx is clear and moist.  Eyes: Conjunctivae are normal. Pupils are equal, round, and reactive to light.  Neck: Normal range of motion. No thyromegaly present.  Cardiovascular: Normal rate, regular rhythm and normal  heart sounds.   Pulmonary/Chest: Effort normal and breath sounds normal. He has no wheezes. He has no rales.  Abdominal: Soft. Bowel sounds are normal. He exhibits no distension. There is no tenderness.  No CVAT.  No suprapubic tenderness  Musculoskeletal: Normal range of motion. He exhibits no edema.  Lymphadenopathy:    He has no cervical adenopathy.  Neurological: He is alert and oriented to person, place, and time.  Psychiatric: He has a normal mood and affect. His behavior is normal. Thought content normal.  Results for HAYVEN, NACHMAN (MRN [id number]) as of 06/22/2016 13:43  Ref. Range 06/22/2016 13:10  Bilirubin, UA Unknown small  Clarity, UA Unknown cloudy  Color, UA Unknown Brown  Glucose Unknown negative  Ketones, UA Unknown negative  Leukocytes, UA Latest Ref Range: Negative  small (1+)   Nitrite, UA Unknown negative  pH, UA Unknown 6.0  Protein, UA Unknown 2+  RBC, UA Unknown Large  Specific Gravity, UA Unknown 1.025  Urobilinogen, UA Unknown 1.0    ASSESSMENT/PLAN:  1. Cystitis with hematuria  2. Urgency of urination  - POCT Urinalysis Dipstick (Automated) - Urine Culture   Patient Instructions  Take the antibiotic twice a day Today, take both doses, one now and one at bedtime  You may take tylenol or advil for pain Rest  Call if not improving in a couple of days  If you become worse, high fevers, vomiting - go to the ER     Raylene Everts, MD

## 2016-06-25 LAB — URINE CULTURE: Culture: >=100000 — AB

## 2016-06-26 ENCOUNTER — Other Ambulatory Visit: Payer: Self-pay | Admitting: Family Medicine

## 2016-06-29 ENCOUNTER — Telehealth: Payer: Self-pay

## 2016-06-29 DIAGNOSIS — Z125 Encounter for screening for malignant neoplasm of prostate: Secondary | ICD-10-CM

## 2016-06-29 DIAGNOSIS — E1169 Type 2 diabetes mellitus with other specified complication: Secondary | ICD-10-CM

## 2016-06-29 DIAGNOSIS — E785 Hyperlipidemia, unspecified: Principal | ICD-10-CM

## 2016-06-29 DIAGNOSIS — I10 Essential (primary) hypertension: Secondary | ICD-10-CM

## 2016-06-29 DIAGNOSIS — E559 Vitamin D deficiency, unspecified: Secondary | ICD-10-CM

## 2016-06-29 LAB — PSA: PSA: 10.9 ng/mL — ABNORMAL HIGH (ref <=4.0)

## 2016-06-29 LAB — HEMOGLOBIN A1C
HEMOGLOBIN A1C: 6.3 % — AB (ref <5.7)
MEAN PLASMA GLUCOSE: 134 mg/dL

## 2016-06-29 NOTE — Telephone Encounter (Signed)
Expired labs reordered  

## 2016-06-30 ENCOUNTER — Other Ambulatory Visit: Payer: Self-pay | Admitting: Family Medicine

## 2016-06-30 DIAGNOSIS — R972 Elevated prostate specific antigen [PSA]: Secondary | ICD-10-CM

## 2016-06-30 LAB — COMPLETE METABOLIC PANEL WITH GFR
ALK PHOS: 29 U/L — AB (ref 40–115)
ALT: 45 U/L (ref 9–46)
AST: 29 U/L (ref 10–35)
Albumin: 3.5 g/dL — ABNORMAL LOW (ref 3.6–5.1)
BILIRUBIN TOTAL: 0.7 mg/dL (ref 0.2–1.2)
BUN: 15 mg/dL (ref 7–25)
CALCIUM: 9 mg/dL (ref 8.6–10.3)
CO2: 25 mmol/L (ref 20–31)
CREATININE: 1.17 mg/dL (ref 0.70–1.33)
Chloride: 100 mmol/L (ref 98–110)
GFR, EST AFRICAN AMERICAN: 78 mL/min (ref >=60)
GFR, EST NON AFRICAN AMERICAN: 68 mL/min (ref >=60)
Glucose, Bld: 114 mg/dL — ABNORMAL HIGH (ref 65–99)
Potassium: 4.2 mmol/L (ref 3.5–5.3)
Sodium: 140 mmol/L (ref 135–146)
TOTAL PROTEIN: 6.9 g/dL (ref 6.1–8.1)

## 2016-06-30 LAB — LIPID PANEL
CHOLESTEROL: 133 mg/dL (ref 125–200)
HDL: 28 mg/dL — AB (ref >=40)
LDL CALC: 82 mg/dL (ref <130)
TRIGLYCERIDES: 116 mg/dL (ref <150)
Total CHOL/HDL Ratio: 4.8 Ratio (ref <=5.0)
VLDL: 23 mg/dL (ref <30)

## 2016-06-30 LAB — VITAMIN D 25 HYDROXY (VIT D DEFICIENCY, FRACTURES): VIT D 25 HYDROXY: 40 ng/mL (ref 30–100)

## 2016-07-05 LAB — URINE CULTURE

## 2016-07-14 ENCOUNTER — Ambulatory Visit (INDEPENDENT_AMBULATORY_CARE_PROVIDER_SITE_OTHER): Payer: Medicare Other

## 2016-07-14 VITALS — BP 130/76 | HR 70 | Resp 18 | Ht 68.25 in | Wt 293.1 lb

## 2016-07-14 DIAGNOSIS — Z23 Encounter for immunization: Secondary | ICD-10-CM | POA: Diagnosis not present

## 2016-07-14 DIAGNOSIS — Z0000 Encounter for general adult medical examination without abnormal findings: Secondary | ICD-10-CM | POA: Diagnosis not present

## 2016-07-14 NOTE — Patient Instructions (Signed)
Thank you for choosing [facility] for your health care needs  The Annual Wellness Visit is designed to allow [place] the chance to assist you in preserving and improving you health.   Dr. Moshe Cipro will see you back in 4 months for your next   Your lab order will be mailed to you with the date to have them done  If you have any concerns please don't hesitate to call.  The new # is [phone]

## 2016-07-19 NOTE — Progress Notes (Signed)
Subjective:    Christopher Lambert is a 60 y.o. male who presents for Medicare Initial preventive examination.   Preventive Screening-Counseling & Management  Tobacco History  Smoking Status  . Never Smoker  Smokeless Tobacco  . Never Used     Current Problems (verified) Patient Active Problem List   Diagnosis Date Noted  . Annual physical exam 11/11/2015  . Welcome to Medicare preventive visit 07/02/2015  . Need for influenza vaccination 07/02/2015  . Hematuria 07/02/2015  . Sinus bradycardia on ECG 07/02/2015  . Special screening for malignant neoplasms, colon 10/23/2014  . Piles (hemorrhoids) 06/19/2014  . Dyslipidemia, goal LDL below 100 06/19/2014  . 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 Metabolic syndrome 123456  . Abnormal cardiovascular function study 09/21/2011  . Palpitations 08/24/2011  . Onychomycosis 08/24/2011  . Diabetes mellitus type 2 in obese ([place]) 06/26/2010  . Morbid obesity ([place]) 12/02/2007  . Essential hypertension, benign 11/09/2007  . Seasonal allergic rhinitis 11/09/2007  . Obstructive sleep apnea 11/09/2007    Medications Prior to Visit Current Outpatient Prescriptions on File Prior to Visit  Medication Sig Dispense Refill  . aspirin 81 MG EC tablet TAKE 1 TABLET EVERY DAY 150 tablet 3  . benazepril-hydrochlorthiazide (LOTENSIN HCT) 20-25 MG tablet TAKE 1 TABLET BY MOUTH EVERY DAY 90 tablet 1  . cholecalciferol (VITAMIN D) 1000 UNITS tablet Take 1,000 Units by mouth daily.      . fluticasone (FLONASE) 50 MCG/ACT nasal spray Place 2 sprays into both nostrils 2 (two) times daily. 16 g 4  . metFORMIN (GLUCOPHAGE) 500 MG tablet TAKE 1 TABLET BY MOUTH EVERY DAY WITH BREAKFAST 90 tablet 1  . NIFEdipine (PROCARDIA XL/ADALAT-CC) 90 MG 24 hr tablet TAKE 1 TABLET BY MOUTH EVERY DAY 90 tablet 1  . simvastatin (ZOCOR) 5 MG tablet TAKE 1 TABLET BY MOUTH AT BEDTIME 90 tablet 1   No current facility-administered medications on file prior to  visit.     Current Medications (verified) Current Outpatient Prescriptions  Medication Sig Dispense Refill  . aspirin 81 MG EC tablet TAKE 1 TABLET EVERY DAY 150 tablet 3  . benazepril-hydrochlorthiazide (LOTENSIN HCT) 20-25 MG tablet TAKE 1 TABLET BY MOUTH EVERY DAY 90 tablet 1  . cholecalciferol (VITAMIN D) 1000 UNITS tablet Take 1,000 Units by mouth daily.      . fluticasone (FLONASE) 50 MCG/ACT nasal spray Place 2 sprays into both nostrils 2 (two) times daily. 16 g 4  . metFORMIN (GLUCOPHAGE) 500 MG tablet TAKE 1 TABLET BY MOUTH EVERY DAY WITH BREAKFAST 90 tablet 1  . NIFEdipine (PROCARDIA XL/ADALAT-CC) 90 MG 24 hr tablet TAKE 1 TABLET BY MOUTH EVERY DAY 90 tablet 1  . simvastatin (ZOCOR) 5 MG tablet TAKE 1 TABLET BY MOUTH AT BEDTIME 90 tablet 1   No current facility-administered medications for this visit.      Allergies (verified) Patient has no known allergies.   PAST HISTORY  Family History Family History  Problem Relation Age of Onset  . Coronary artery disease Brother 48    Premature  . Hypertension Brother   . Hypertension Father   . Cancer Father 75    colon   . Hypertension Mother   . Diabetes Brother   . Hypertension Sister   . Hypertension Sister   . Hypertension Sister   . Hypertension Sister     Social History Social History  Substance Use Topics  . Smoking status: Never Smoker  . Smokeless tobacco: Never Used  .  Alcohol use No    Are there smokers in your home (other than you)?  No  Risk Factors Current exercise habits: The patient does not participate in regular exercise at present.  Dietary issues discussed: Portion control and changes to food labels     Cardiac risk factors: advanced age (older than 80 for men, 8 for women), diabetes mellitus, dyslipidemia, hypertension, male gender, obesity (BMI >= 30 kg/m2) and sedentary lifestyle.  Depression Screen (Note: if answer to either of the following is "Yes", a more complete depression  screening is indicated)   Q1: Over the past two weeks, have you felt down, depressed or hopeless? No  Q2: Over the past two weeks, have you felt little interest or pleasure in doing things? No  Have you lost interest or pleasure in daily life? No  Do you often feel hopeless? No  Do you cry easily over simple problems? No  Activities of Daily Living In your present state of health, do you have any difficulty performing the following activities?:  Driving? No Managing money?  No Feeding yourself? No Getting from bed to chair? No Climbing a flight of stairs? No Preparing food and eating?: No Bathing or showering? No Getting dressed: No Getting to the toilet? No Using the toilet:No Moving around from place to place: No In the past year have you fallen or had a near fall?:No   Are you sexually active?  Yes  Do you have more than one partner?  No  Hearing Difficulties: No Do you often ask people to speak up or repeat themselves? No Do you experience ringing or noises in your ears? No Do you have difficulty understanding soft or whispered voices? No   Do you feel that you have a problem with memory? No  Do you often misplace items? No  Do you feel safe at home?  Yes  Cognitive Testing  Alert? Yes  Normal Appearance?Yes  Oriented to person? Yes  Place? Yes   Time? Yes  Recall of three objects?  Yes  Can perform simple calculations? Yes  Displays appropriate judgment?Yes  Can read the correct time from a watch face?Yes   Advanced Directives have been discussed with the patient? Yes   List the Names of Other Physician/Practitioners you currently use: 1.  Dr.  Jeffie Pollock (urology)  2.  Dr.  Annamaria Boots (pulmonology)   Indicate any recent Medical Services you may have received from other than [facility] providers in the past year (date may be approximate).  Immunization History  Administered Date(s) Administered  . Influenza Split 08/24/2011, 05/30/2012  . Influenza Whole 08/18/2005,  06/26/2010  . Influenza,inj,Quad PF,36+ Mos 06/23/2013, 06/19/2014, 07/14/2016  . Influenza-Unspecified 05/31/2015  . Pneumococcal Conjugate-13 10/23/2014  . Pneumococcal Polysaccharide-23 02/16/2005, 05/20/2012  . Td 02/16/2005  . Tdap 02/27/2015    Screening Tests Health Maintenance  Topic Date Due  . FOOT EXAM  11/10/2016  . OPHTHALMOLOGY EXAM  11/19/2016  . HEMOGLOBIN A1C  12/28/2016  . COLONOSCOPY  10/03/2017  . TETANUS/TDAP  02/26/2025  . PNEUMOCOCCAL POLYSACCHARIDE VACCINE  Completed  . INFLUENZA VACCINE  Completed  . Hepatitis C Screening  Completed  . HIV Screening  Completed    All answers were reviewed with the patient and necessary referrals were made:  Vanetta Mulders, LPN   [id number]   History reviewed: allergies, current medications, past family history, past medical history, past social history, past surgical history and problem list  Review of Systems A comprehensive review of systems was negative.  Objective:     Vision by Snellen chart: right eye:20/20, left eye:20/20 Blood pressure 130/76, pulse 70, resp. rate 18, height 5' 8.25" (1.734 m), weight 293 lb 1.9 oz (133 kg), SpO2 94 %. Body mass index is 44.24 kg/m.  No exam performed today, annual wellness without physical exam.     Assessment:   Medicare annual wellness visit, subsequent Annual exam as documented. Counseling done  re healthy lifestyle involving commitment to 150 minutes exercise per week, heart healthy diet, and attaining healthy weight.The importance of adequate sleep also discussed. Regular seat belt use and home safety, is also discussed. Changes in health habits are decided on by the patient with goals and time frames  set for achieving them. Immunization and cancer screening needs are specifically addressed at this visit.   Need for influenza vaccination After obtaining informed consent, the vaccine is  administered by LPN.        Plan:     During the  course of the visit the patient was educated and counseled about appropriate screening and preventive services including:    Influenza vaccine  Nutrition counseling   Diet review for nutrition referral? Yes ____  Not Indicated _x___   Patient Instructions (the written plan) was given to the patient.  Medicare Attestation I have personally reviewed: The patient's medical and social history Their use of alcohol, tobacco or illicit drugs Their current medications and supplements The patient's functional ability including ADLs,fall risks, home safety risks, cognitive, and hearing and visual impairment Diet and physical activities Evidence for depression or mood disorders  The patient's weight, height, BMI, and visual acuity have been recorded in the chart.  I have made referrals, counseling, and provided education to the patient based on review of the above and I have provided the patient with a written personalized care plan for preventive services.     Denman George [place], [place]   [id number]

## 2016-07-21 DIAGNOSIS — Z0000 Encounter for general adult medical examination without abnormal findings: Secondary | ICD-10-CM | POA: Insufficient documentation

## 2016-07-21 NOTE — Assessment & Plan Note (Signed)
After obtaining informed consent, the vaccine is  administered by LPN.  

## 2016-07-21 NOTE — Assessment & Plan Note (Signed)

## 2016-07-25 ENCOUNTER — Telehealth: Payer: Self-pay

## 2016-07-25 DIAGNOSIS — E1169 Type 2 diabetes mellitus with other specified complication: Secondary | ICD-10-CM

## 2016-07-25 DIAGNOSIS — E669 Obesity, unspecified: Secondary | ICD-10-CM

## 2016-07-25 DIAGNOSIS — I10 Essential (primary) hypertension: Secondary | ICD-10-CM

## 2016-07-25 DIAGNOSIS — E785 Hyperlipidemia, unspecified: Secondary | ICD-10-CM

## 2016-07-25 NOTE — Telephone Encounter (Signed)
Labs ordered and mailed to patient.  

## 2016-11-02 LAB — CBC
HEMATOCRIT: 42 % (ref 38.5–50.0)
HEMOGLOBIN: 14.1 g/dL (ref 13.2–17.1)
MCH: 29.9 pg (ref 27.0–33.0)
MCHC: 33.6 g/dL (ref 32.0–36.0)
MCV: 89 fL (ref 80.0–100.0)
MPV: 9.6 fL (ref 7.5–12.5)
PLATELETS: 235 10*3/uL (ref 140–400)
RBC: 4.72 MIL/uL (ref 4.20–5.80)
RDW: 14.3 % (ref 11.0–15.0)
WBC: 4.3 10*3/uL (ref 3.8–10.8)

## 2016-11-03 LAB — COMPLETE METABOLIC PANEL WITH GFR
ALBUMIN: 4 g/dL (ref 3.6–5.1)
ALK PHOS: 34 U/L — AB (ref 40–115)
ALT: 18 U/L (ref 9–46)
AST: 13 U/L (ref 10–35)
BILIRUBIN TOTAL: 1 mg/dL (ref 0.2–1.2)
BUN: 13 mg/dL (ref 7–25)
CALCIUM: 8.9 mg/dL (ref 8.6–10.3)
CO2: 30 mmol/L (ref 20–31)
CREATININE: 1.06 mg/dL (ref 0.70–1.33)
Chloride: 102 mmol/L (ref 98–110)
GFR, EST AFRICAN AMERICAN: 88 mL/min (ref >=60)
GFR, EST NON AFRICAN AMERICAN: 76 mL/min (ref >=60)
Glucose, Bld: 116 mg/dL — ABNORMAL HIGH (ref 65–99)
Potassium: 3.8 mmol/L (ref 3.5–5.3)
Sodium: 140 mmol/L (ref 135–146)
TOTAL PROTEIN: 7 g/dL (ref 6.1–8.1)

## 2016-11-03 LAB — HEMOGLOBIN A1C
HEMOGLOBIN A1C: 6.3 % — AB (ref <5.7)
Mean Plasma Glucose: 134 mg/dL

## 2016-11-03 LAB — LIPID PANEL
CHOLESTEROL: 137 mg/dL (ref <200)
HDL: 37 mg/dL — ABNORMAL LOW (ref >40)
LDL CALC: 79 mg/dL (ref <100)
TRIGLYCERIDES: 103 mg/dL (ref <150)
Total CHOL/HDL Ratio: 3.7 Ratio (ref <5.0)
VLDL: 21 mg/dL (ref <30)

## 2016-11-03 LAB — TSH: TSH: 2.06 mIU/L (ref 0.40–4.50)

## 2016-11-12 ENCOUNTER — Ambulatory Visit (INDEPENDENT_AMBULATORY_CARE_PROVIDER_SITE_OTHER): Payer: Medicare Other | Admitting: Family Medicine

## 2016-11-12 ENCOUNTER — Encounter: Payer: Self-pay | Admitting: Family Medicine

## 2016-11-12 VITALS — BP 120/82 | HR 65 | Resp 15 | Ht 68.0 in | Wt 288.1 lb

## 2016-11-12 DIAGNOSIS — Z0000 Encounter for general adult medical examination without abnormal findings: Secondary | ICD-10-CM

## 2016-11-12 DIAGNOSIS — E669 Obesity, unspecified: Secondary | ICD-10-CM

## 2016-11-12 DIAGNOSIS — M5431 Sciatica, right side: Secondary | ICD-10-CM

## 2016-11-12 DIAGNOSIS — E1169 Type 2 diabetes mellitus with other specified complication: Secondary | ICD-10-CM

## 2016-11-12 MED ORDER — IBUPROFEN 800 MG PO TABS: ORAL_TABLET | ORAL | 0 refills | Status: AC

## 2016-11-12 MED ORDER — PREDNISONE 5 MG (21) PO TBPK: 5.0000 mg | ORAL_TABLET | ORAL | 0 refills | Status: DC

## 2016-11-12 NOTE — Assessment & Plan Note (Signed)
3 week history, ibuprofen and prednisone short term if no response , ortho eval

## 2016-11-12 NOTE — Assessment & Plan Note (Signed)

## 2016-11-12 NOTE — Patient Instructions (Signed)
Wellness visit Novemebr 1 or after , call if you need me sooner  PLEASE ensure that you follow up with urology re your prostate  Ibuprofen and prednisone sent for right sciaitc pain if persists , call , I will refer you to Dr Percell Miller  Excellent labs, no med change  Please work on good  health habits so that your health will improve. 1. Commitment to daily physical activity for 30 to 60  minutes, if you are able to do this.  2. Commitment to wise food choices. Aim for half of your  food intake to be vegetable and fruit, one quarter starchy foods, and one quarter protein. Try to eat on a regular schedule  3 meals per day, snacking between meals should be limited to vegetables or fruits or small portions of nuts. 64 ounces of water per day is generally recommended, unless you have specific health conditions, like heart failure or kidney failure where you will need to limit fluid intake.  3. Commitment to sufficient and a  good quality of physical and mental rest daily, generally between 6 to 8 hours per day.  WITH PERSISTANCE AND PERSEVERANCE, THE IMPOSSIBLE , BECOMES THE NORM! Thank you  for choosing [facility]. We consider it a privelige to serve you.  Delivering excellent health care in a caring and  compassionate way is our goal.  Partnering with you,  so that together we can achieve this goal is our strategy.

## 2016-11-13 ENCOUNTER — Encounter: Payer: Self-pay | Admitting: Family Medicine

## 2016-11-13 NOTE — Assessment & Plan Note (Signed)
Controlled, no change in medication Christopher Lambert is reminded of the importance of commitment to daily physical activity for 30 minutes or more, as able and the need to limit carbohydrate intake to 30 to 60 grams per meal to help with blood sugar control.   The need to take medication as prescribed, test blood sugar as directed, and to call between visits if there is a concern that blood sugar is uncontrolled is also discussed.   Christopher Lambert is reminded of the importance of daily foot exam, annual eye examination, and good blood sugar, blood pressure and cholesterol control.  Diabetic Labs Latest Ref Rng & Units 11/02/2016 06/29/2016 11/11/2015 07/01/2015 02/25/2015  HbA1c <5.7 % 6.3(H) 6.3(H) 6.8(H) - 6.3(H)  Microalbumin <2.0 mg/dL - - - - -  Micro/Creat Ratio 0.0 - 30.0 mg/g - - - - -  Chol <200 mg/dL 137 133 124(L) - 109  HDL >40 mg/dL 37(L) 28(L) 35(L) - 39(L)  Calc LDL <100 mg/dL 79 82 69 - 53  Triglycerides <150 mg/dL 103 116 102 - 86  Creatinine 0.70 - 1.33 mg/dL 1.06 1.17 1.01 1.08 0.99   BP/Weight 11/12/2016 07/14/2016 06/22/2016 11/15/2015 11/11/2015 10/09/2015 [id number]  Systolic BP [id number]  Diastolic BP 82 76 78 84 84 88 84  Wt. (Lbs) 288.12 293.12 290.12 306.8 300.12 304.4 293.4  BMI 43.81 44.24 43.79 46.28 46.99 45.92 45.94   Foot/eye exam completion dates Latest Ref Rng & Units 11/12/2016 11/20/2015  Eye Exam No Retinopathy - No Retinopathy  Foot Form Completion - Done -

## 2016-11-13 NOTE — Progress Notes (Signed)
   Christopher Lambert     MRN: [id number]      DOB: 12-31-1955   HPI: Patient is in for annual physical exam. 4 week h/o right buttock pain which radiates to toes, worse after sitting, this is new, no aggravating or relieving factors noted Recent labs, are reviewed. Immunization is reviewed , and  updated if needed.    PE; Pleasant male, alert and oriented x 3, in no cardio-pulmonary distress. Afebrile. HEENT No facial trauma or asymetry. Sinuses non tender. EOMI, pupils equally reactive to light. External ears normal, tympanic membranes clear. Oropharynx moist, no exudate. Neck: supple, no adenopathy,JVD or thyromegaly.No bruits.  Chest: Clear to ascultation bilaterally.No crackles or wheezes. Non tender to palpation  Breast: No asymetry,no masses. No nipple discharge or inversion. No axillary or supraclavicular adenopathy  Cardiovascular system; Heart sounds normal,  S1 and  S2 ,no S3.  No murmur, or thrill. Apical beat not displaced Peripheral pulses normal.  Abdomen: Soft, non tender, no organomegaly or masses. No bruits. Bowel sounds normal. No guarding, tenderness or rebound.  Rectal:  Normal sphincter tone. No hemorrhoids or  masses. guaiac negative stool. Prostate smooth and firm    Musculoskeletal exam: Decreased ROM of lumbar  Spine, adequate in  hips , shoulders and knees. No deformity ,swelling or crepitus noted. No muscle wasting or atrophy.   Neurologic: Cranial nerves 2 to 12 intact. Power, tone ,sensation and reflexes normal throughout. No disturbance in gait. No tremor.  Skin: Intact, no ulceration, erythema , scaling or rash noted. Pigmentation normal throughout  Psych; Normal mood and affect. Judgement and concentration normal   Assessment & Plan:  Annual physical exam Annual exam as documented. Counseling done  re healthy lifestyle involving commitment to 150 minutes exercise per week, heart healthy diet, and attaining healthy  weight.The importance of adequate sleep also discussed. Regular seat belt use and home safety, is also discussed. Changes in health habits are decided on by the patient with goals and time frames  set for achieving them. Immunization and cancer screening needs are specifically addressed at this visit.   Sciatica, right side 3 week history, ibuprofen and prednisone short term if no response , ortho eval

## 2016-11-23 ENCOUNTER — Ambulatory Visit (INDEPENDENT_AMBULATORY_CARE_PROVIDER_SITE_OTHER): Payer: Medicare Other | Admitting: Internal Medicine

## 2016-11-23 ENCOUNTER — Encounter: Payer: Self-pay | Admitting: Internal Medicine

## 2016-11-23 DIAGNOSIS — G4733 Obstructive sleep apnea (adult) (pediatric): Secondary | ICD-10-CM

## 2016-11-23 NOTE — Progress Notes (Signed)
Patient ID: Christopher Lambert, male    DOB: [birth year], 61 y.o.   MRN: [id number]  HPI male never smoker followed for OSA, rhinitis, complicated by HBP, DM 2, morbid obesity NPSG 01/31/15- severe OSA AHI 49.4/ hr, CPAP suggested 13, AHI 0.0, weight 294 lb  -------------------------------------------------------------------------------  10/09/2015-61 year old male never smoker followed for OSA, rhinitis CPAP 16/ [facility] FOLLOW FOR: Sleep Apnea.  Patient using a loaner machine from [facility] that has name of another patient on SD card, however, patient states that he is the only one that has been using it.  Patient wears CPAP machine every night.  Patient still waiting for his machine to be replaced. The download sheet provided had a different person's name on it, a pressure of 14 instead of the 16 we have record of using, and very poor control of AHI despite excellent compliance. Not sure it is his information. It may have been left on the card from previous user. He admits he snores a little at times but uses CPAP every night and sleeps well with it. He is still trying to work out how to finance new CPAP machine.  11/23/2016- 61 year old male never smoker followed for OSA, rhinitis, complicated by HBP, DM 2, morbid obesity CPAP 16/[facility] FOLLOWS FOR: DME:[facility]. Pt wears CPAP nightly and DL attached.  Pt will need order for new supplies.  Download 100%/4 hour compliance with AHI 2.7/hour indicating great control. He definitely feels he benefits. Admits he feels tired if he doesn't use it.  ROS-see HPI Constitutional:   No-   weight loss, night sweats, fevers, chills, fatigue, lassitude. HEENT:   No-  headaches, difficulty swallowing, tooth/dental problems, sore throat,       No-  sneezing, itching, ear ache, nasal congestion, post nasal drip,  CV:  No-   chest pain, orthopnea, PND, swelling in lower extremities, anasarca, dizziness, palpitations Resp: No-   shortness of breath with exertion or at  rest.              No-   productive cough,  No non-productive cough,  No- coughing up of blood.              No-   change in color of mucus.  No- wheezing.   Skin: No-   rash or lesions. GI:  No-   heartburn, indigestion, abdominal pain, nausea, vomiting, GU:  MS:  No-   joint pain or swelling.   Neuro-     nothing unusual Psych:  No- change in mood or affect. No depression or anxiety.  No memory loss.  Objective:   Physical Exam General- Alert, Oriented, Affect-appropriate, Distress- none acute, +obese Skin- rash-none, lesions- none, excoriation- none Lymphadenopathy- none Head- atraumatic            Eyes- Gross vision intact, PERRLA, conjunctivae clear secretions            Ears- Hearing, canals-normal            Nose- Clear, no-Septal dev, mucus, polyps, erosion, perforation             Throat- Mallampati IV , mucosa clear and it seems normally moist, drainage- none, tonsils- atrophic Neck- flexible , trachea midline, no stridor , thyroid nl, carotid no bruit Chest - symmetrical excursion , unlabored           Heart/CV- RRR , no murmur , no gallop  , no rub, nl s1 s2                           -  JVD- none , edema 1+, stasis changes-+, varices- none           Lung- clear to P&A, wheeze- none, cough- none , dullness-none, rub- none           Chest wall-  Abd-  Br/ Gen/ Rectal- Not done, not indicated Extrem- +scars from bilateral TKR Neuro- grossly intact to observation

## 2016-11-23 NOTE — Patient Instructions (Signed)
Order- DME [facility] Patient needs new CPAP supplies please. We can continue CPAP 16, mask of choice, humidifier, supplies, AirView    Dx OSA  Please call as needed

## 2016-12-26 ENCOUNTER — Other Ambulatory Visit: Payer: Self-pay | Admitting: Family Medicine

## 2017-01-10 NOTE — Assessment & Plan Note (Signed)
We can continue CPAP 16/[facility]. He realizes that he is better off with CPAP and demonstrates excellent compliance and control.

## 2017-01-10 NOTE — Assessment & Plan Note (Signed)
Consider bariatric referral 

## 2017-05-06 ENCOUNTER — Other Ambulatory Visit: Payer: Self-pay | Admitting: Family Medicine

## 2017-06-23 ENCOUNTER — Other Ambulatory Visit: Payer: Self-pay | Admitting: Family Medicine

## 2017-06-23 NOTE — Telephone Encounter (Signed)
Seen 3 1 18

## 2017-07-29 ENCOUNTER — Ambulatory Visit: Payer: Medicare Other

## 2017-08-09 NOTE — Progress Notes (Deleted)
Subjective:   Christopher Lambert is a 61 y.o. male who presents for Medicare Annual/Subsequent preventive examination.  Review of Systems:  ***       Objective:    Vitals: There were no vitals taken for this visit.  There is no height or weight on file to calculate BMI.  Tobacco Social History   Tobacco Use  Smoking Status Never Smoker  Smokeless Tobacco Never Used     Counseling given: Not Answered   Past Medical History:  Diagnosis Date  . Arthritis of knee    Bilateral  . 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 ED (erectile dysfunction)   . Essential hypertension, benign 1990  . H/O: whooping cough    aschild  . Hemorrhoids, internal   . Obesity   . Seasonal allergies   . Sleep apnea 2004   wears CPAP nightly     Past Surgical History:  Procedure Laterality Date  . CARDIAC CATHETERIZATION  09/25/2011  . CARPAL TUNNEL RELEASE     Right   . HERNIA REPAIR  7544 approx   umbilical  . JOINT REPLACEMENT  2011   right knee  . JOINT REPLACEMENT  2013   left knee  . left knee replacement   sept 2013  . Right knee replacement  06/2010  . TOTAL KNEE ARTHROPLASTY  05/18/2012   Procedure: TOTAL KNEE ARTHROPLASTY;  Surgeon: Ninetta Lights, MD;  Location: [place];  Service: Orthopedics;  Laterality: Left;  left total knee arthroplasty  . UMBILICAL HERNIA REPAIR     Family History  Problem Relation Age of Onset  . Coronary artery disease Brother 48       Premature  . Hypertension Brother   . Hypertension Father   . Cancer Father 2       colon   . Hypertension Mother   . Diabetes Brother   . Hypertension Sister   . Hypertension Sister   . Hypertension Sister   . Hypertension Sister    Social History   Substance and Sexual Activity  Sexual Activity Yes    Outpatient Encounter Medications as of 08/09/2017  Medication Sig  . aspirin 81 MG EC tablet TAKE 1 TABLET EVERY DAY  . benazepril-hydrochlorthiazide (LOTENSIN HCT) 20-25 MG tablet  TAKE 1 TABLET BY MOUTH EVERY DAY  . cholecalciferol (VITAMIN D) 1000 UNITS tablet Take 1,000 Units by mouth daily.    . fluticasone (FLONASE) 50 MCG/ACT nasal spray PLACE 2 SPRAYS INTO BOTH NOSTRILS 2 (TWO) TIMES DAILY.  . metFORMIN (GLUCOPHAGE) 500 MG tablet TAKE 1 TABLET BY MOUTH EVERY DAY WITH BREAKFAST  . NIFEdipine (PROCARDIA XL/ADALAT-CC) 90 MG 24 hr tablet TAKE 1 TABLET BY MOUTH EVERY DAY  . simvastatin (ZOCOR) 5 MG tablet TAKE 1 TABLET BY MOUTH AT BEDTIME   No facility-administered encounter medications on file as of 08/09/2017.     Activities of Daily Living No flowsheet data found.  Patient Care Team: [name], MD as PCP - General Deneise Lever, MD as Consulting Physician (Pulmonary Disease)   Assessment:    *** Exercise Activities and Dietary recommendations    Goals    . Weight (lb) < 250 lb (113.4 kg)      Fall Risk Fall Risk  07/01/2015 10/09/2013  Falls in the past year? No No   Depression Screen PHQ 2/9 Scores 11/12/2016 02/27/2015 10/23/2014 10/09/2013  PHQ - 2 Score 0 0 0 0  PHQ- 9  Score - 0 0 -    Cognitive Function        Immunization History  Administered Date(s) Administered  . Influenza Split 08/24/2011, 05/30/2012  . Influenza Whole 08/18/2005, 06/26/2010  . Influenza,inj,Quad PF,6+ Mos 06/23/2013, 06/19/2014, 07/14/2016  . Influenza-Unspecified 05/31/2015  . Pneumococcal Conjugate-13 10/23/2014  . Pneumococcal Polysaccharide-23 02/16/2005, 05/20/2012  . Td 02/16/2005  . Tdap 02/27/2015   Screening Tests Health Maintenance  Topic Date Due  . OPHTHALMOLOGY EXAM  11/19/2016  . INFLUENZA VACCINE  04/14/2017  . HEMOGLOBIN A1C  05/02/2017  . COLONOSCOPY  10/03/2017  . FOOT EXAM  11/13/2017  . TETANUS/TDAP  02/26/2025  . PNEUMOCOCCAL POLYSACCHARIDE VACCINE  Completed  . Hepatitis C Screening  Completed  . HIV Screening  Completed      Plan:   ***  I have personally reviewed and noted the following in the patient's chart:    . Medical and social history . Use of alcohol, tobacco or illicit drugs  . Current medications and supplements . Functional ability and status . Nutritional status . Physical activity . Advanced directives . List of other physicians . Hospitalizations, surgeries, and ER visits in previous 12 months . Vitals . Screenings to include cognitive, depression, and falls . Referrals and appointments  In addition, I have reviewed and discussed with patient certain preventive protocols, quality metrics, and best practice recommendations. A written personalized care plan for preventive services as well as general preventive health recommendations were provided to patient.     Merceda Elks, LPN  45/11/8880

## 2017-08-09 NOTE — Progress Notes (Signed)
This encounter was created in error - please disregard.

## 2017-09-01 ENCOUNTER — Encounter: Payer: Self-pay | Admitting: Family Medicine

## 2017-09-01 ENCOUNTER — Ambulatory Visit (INDEPENDENT_AMBULATORY_CARE_PROVIDER_SITE_OTHER): Payer: Medicare Other | Admitting: Family Medicine

## 2017-09-01 VITALS — BP 120/78 | HR 73 | Resp 16 | Ht 68.0 in | Wt 286.8 lb

## 2017-09-01 DIAGNOSIS — E1169 Type 2 diabetes mellitus with other specified complication: Secondary | ICD-10-CM

## 2017-09-01 DIAGNOSIS — Z125 Encounter for screening for malignant neoplasm of prostate: Secondary | ICD-10-CM

## 2017-09-01 DIAGNOSIS — Z0000 Encounter for general adult medical examination without abnormal findings: Secondary | ICD-10-CM

## 2017-09-01 DIAGNOSIS — I10 Essential (primary) hypertension: Secondary | ICD-10-CM

## 2017-09-01 DIAGNOSIS — Z23 Encounter for immunization: Secondary | ICD-10-CM

## 2017-09-01 DIAGNOSIS — E669 Obesity, unspecified: Secondary | ICD-10-CM

## 2017-09-01 DIAGNOSIS — E785 Hyperlipidemia, unspecified: Secondary | ICD-10-CM

## 2017-09-01 NOTE — Progress Notes (Signed)
Preventive Screening-Counseling & Management   Patient present here today for a Medicare annual wellness visit.   Current Problems (verified)   Medications Prior to Visit Allergies (verified)   PAST HISTORY  Family History (updated)   Social History Married, no children, disabled at least 7 years from double knee replacements. Never been a smoker or regular alcohol user.    Risk Factors  Current exercise habits: Gets physical activity from cutting wood, pushing wheel barrow around the yard (usually 5 trips per day)   Dietary issues discussed: heart healthy, low carb low fat diet encouraged    Cardiac risk factors: Type 2 diabetes  Depression Screen  (Note: if answer to either of the following is "Yes", a more complete depression screening is indicated)   Over the past two weeks, have you felt down, depressed or hopeless? No  Over the past two weeks, have you felt little interest or pleasure in doing things? No  Have you lost interest or pleasure in daily life? No  Do you often feel hopeless? No  Do you cry easily over simple problems? No   Activities of Daily Living  In your present state of health, do you have any difficulty performing the following activities?  Driving?: No Managing money?: No Feeding yourself?:No Getting from bed to chair?:No Climbing a flight of stairs?: Able to climb stairs- does so at his home daily  Preparing food and eating?:No Bathing or showering?:No Getting dressed?:No Getting to the toilet?:No Using the toilet?:No Moving around from place to place?: No, hasn't had much problem since knee replacements  Fall Risk Assessment In the past year have you fallen or had a near fall?:No Are you currently taking any medications that make you dizzy?:No   Hearing Difficulties: No Do you often ask people to speak up or repeat themselves?:No Do you experience ringing or noises in your ears?:No Do you have difficulty understanding soft or whispered  voices?:No  Cognitive Testing  Alert? Yes Normal Appearance?Yes  Oriented to person? Yes Place? Yes  Time? Yes  Displays appropriate judgment?Yes  Can read the correct time from a watch face? yes Are you having problems remembering things?No  Advanced Directives have been discussed with the patient?Yes    List the Names of Other Physician/Practitioners you currently use:  Dr Annamaria Boots (pulmonary)- sleep apnea    Indicate any recent Medical Services you may have received from other than [facility] providers in the past year (date may be approximate).     Medicare Attestation  I have personally reviewed:  The patient's medical and social history  Their use of alcohol, tobacco or illicit drugs  Their current medications and supplements  The patient's functional ability including ADLs,fall risks, home safety risks, cognitive, and hearing and visual impairment  Diet and physical activities  Evidence for depression or mood disorders  The patient's weight, height, BMI, and visual acuity have been recorded in the chart. I have made referrals, counseling, and provided education to the patient based on review of the above and I have provided the patient with a written personalized care plan for preventive services.    Physical Exam BP 120/78   Pulse 73   Resp 16   Ht 5\' 8"  (1.727 m)   Wt 286 lb 12.8 oz (130.1 kg)   SpO2 98%   BMI 43.61 kg/m     Assessment & Plan:  Medicare annual wellness visit, subsequent Annual exam as documented. Counseling done  re healthy lifestyle involving commitment to 150 minutes exercise  per week, heart healthy diet, and attaining healthy weight.The importance of adequate sleep also discussed. Regular seat belt use and home safety, is also discussed. Changes in health habits are decided on by the patient with goals and time frames  set for achieving them. Immunization and cancer screening needs are specifically addressed at this visit.   DM type 2 with  diabetic dyslipidemia Controlled, no change in medication Mr. Glidden is reminded of the importance of commitment to daily physical activity for 30 minutes or more, as able and the need to limit carbohydrate intake to 30 to 60 grams per meal to help with blood sugar control.   The need to take medication as prescribed, test blood sugar as directed, and to call between visits if there is a concern that blood sugar is uncontrolled is also discussed.   Mr. Soffer is reminded of the importance of daily foot exam, annual eye examination, and good blood sugar, blood pressure and cholesterol control.  Diabetic Labs Latest Ref Rng & Units 11/02/2016 06/29/2016 11/11/2015 07/01/2015 02/25/2015  HbA1c <5.7 % 6.3(H) 6.3(H) 6.8(H) - 6.3(H)  Microalbumin <2.0 mg/dL - - - - -  Micro/Creat Ratio 0.0 - 30.0 mg/g - - - - -  Chol <200 mg/dL 137 133 124(L) - 109  HDL >40 mg/dL 37(L) 28(L) 35(L) - 39(L)  Calc LDL <100 mg/dL 79 82 69 - 53  Triglycerides <150 mg/dL 103 116 102 - 86  Creatinine 0.70 - 1.33 mg/dL 1.06 1.17 1.01 1.08 0.99   BP/Weight 09/01/2017 11/23/2016 11/12/2016 07/14/2016 06/22/2016 11/15/2015 3/90/3009  Systolic BP 233 007 622 633 354 562 563  Diastolic BP 78 72 82 76 78 84 84  Wt. (Lbs) 286.8 295.4 288.12 293.12 290.12 306.8 300.12  BMI 43.61 44.59 43.81 44.24 43.79 46.28 46.99   Foot/eye exam completion dates Latest Ref Rng & Units 09/01/2017 11/12/2016  Eye Exam No Retinopathy - -  Foot Form Completion - Done Done

## 2017-09-01 NOTE — Assessment & Plan Note (Signed)

## 2017-09-01 NOTE — Patient Instructions (Addendum)
Annual physical exam end April, call if you need me sooner  Flu vaccine today  You are being referred for diabetic foot care at the diabetic foot center in [facility] on great health habits  And improved health overall  We will give you information on living will today before you leave and encourage you to re address this  ' You will be  referred to Dr Jeffie Pollock for follow up   You will be referred to Dr Laural Golden for screening colonoscopy   Please work on good  health habits so that your health will improve. 1. Commitment to daily physical activity for 30 to 60  minutes, if you are able to do this.  2. Commitment to wise food choices. Aim for half of your  food intake to be vegetable and fruit, one quarter starchy foods, and one quarter protein. Try to eat on a regular schedule  3 meals per day, snacking between meals should be limited to vegetables or fruits or small portions of nuts. 64 ounces of water per day is generally recommended, unless you have specific health conditions, like heart failure or kidney failure where you will need to limit fluid intake.  3. Commitment to sufficient and a  good quality of physical and mental rest daily, generally between 6 to 8 hours per day.  WITH PERSISTANCE AND PERSEVERANCE, THE IMPOSSIBLE , BECOMES THE NORM! It is important that you exercise regularly at least 30 minutes 5 times a week. If you develop chest pain, have severe difficulty breathing, or feel very tired, stop exercising immediately and seek medical attention  Thank you  for choosing [facility]. We consider it a privelige to serve you.  Delivering excellent health care in a caring and  compassionate way is our goal.  Partnering with you,  so that together we can achieve this goal is our strategy.    Fasting lipid, cmp and eGFR, HBA1C and pSA end of next week

## 2017-09-04 ENCOUNTER — Encounter: Payer: Self-pay | Admitting: Family Medicine

## 2017-09-04 NOTE — Assessment & Plan Note (Signed)
Controlled, no change in medication Christopher Lambert is reminded of the importance of commitment to daily physical activity for 30 minutes or more, as able and the need to limit carbohydrate intake to 30 to 60 grams per meal to help with blood sugar control.   The need to take medication as prescribed, test blood sugar as directed, and to call between visits if there is a concern that blood sugar is uncontrolled is also discussed.   Christopher Lambert is reminded of the importance of daily foot exam, annual eye examination, and good blood sugar, blood pressure and cholesterol control.  Diabetic Labs Latest Ref Rng & Units 11/02/2016 06/29/2016 11/11/2015 07/01/2015 02/25/2015  HbA1c <5.7 % 6.3(H) 6.3(H) 6.8(H) - 6.3(H)  Microalbumin <2.0 mg/dL - - - - -  Micro/Creat Ratio 0.0 - 30.0 mg/g - - - - -  Chol <200 mg/dL 137 133 124(L) - 109  HDL >40 mg/dL 37(L) 28(L) 35(L) - 39(L)  Calc LDL <100 mg/dL 79 82 69 - 53  Triglycerides <150 mg/dL 103 116 102 - 86  Creatinine 0.70 - 1.33 mg/dL 1.06 1.17 1.01 1.08 0.99   BP/Weight 09/01/2017 11/23/2016 11/12/2016 07/14/2016 06/22/2016 11/15/2015 0/99/8338  Systolic BP 250 539 767 341 937 902 409  Diastolic BP 78 72 82 76 78 84 84  Wt. (Lbs) 286.8 295.4 288.12 293.12 290.12 306.8 300.12  BMI 43.61 44.59 43.81 44.24 43.79 46.28 46.99   Foot/eye exam completion dates Latest Ref Rng & Units 09/01/2017 11/12/2016  Eye Exam No Retinopathy - -  Foot Form Completion - Done Done

## 2017-09-21 ENCOUNTER — Encounter: Payer: Self-pay | Admitting: Family Medicine

## 2017-09-21 LAB — HEMOGLOBIN A1C
Hgb A1c MFr Bld: 6 % of total Hgb — ABNORMAL HIGH (ref <5.7)
Mean Plasma Glucose: 126 (calc)
eAG (mmol/L): 7 (calc)

## 2017-09-21 LAB — COMPLETE METABOLIC PANEL WITH GFR
AG Ratio: 1.5 (calc) (ref 1.0–2.5)
ALT: 16 U/L (ref 9–46)
AST: 13 U/L (ref 10–35)
Albumin: 4.1 g/dL (ref 3.6–5.1)
Alkaline phosphatase (APISO): 34 U/L — ABNORMAL LOW (ref 40–115)
BUN: 17 mg/dL (ref 7–25)
CALCIUM: 8.8 mg/dL (ref 8.6–10.3)
CO2: 29 mmol/L (ref 20–32)
Chloride: 101 mmol/L (ref 98–110)
Creat: 1.06 mg/dL (ref 0.70–1.25)
GFR, EST NON AFRICAN AMERICAN: 76 mL/min/{1.73_m2} (ref >=60)
GFR, Est African American: 88 mL/min/{1.73_m2} (ref >=60)
GLOBULIN: 2.8 g/dL (ref 1.9–3.7)
Glucose, Bld: 107 mg/dL — ABNORMAL HIGH (ref 65–99)
Potassium: 3.5 mmol/L (ref 3.5–5.3)
Sodium: 139 mmol/L (ref 135–146)
Total Bilirubin: 1.1 mg/dL (ref 0.2–1.2)
Total Protein: 6.9 g/dL (ref 6.1–8.1)

## 2017-09-21 LAB — LIPID PANEL
Cholesterol: 141 mg/dL (ref <200)
HDL: 38 mg/dL — AB (ref >40)
LDL Cholesterol (Calc): 84 mg/dL (calc)
NON-HDL CHOLESTEROL (CALC): 103 mg/dL (ref <130)
TRIGLYCERIDES: 94 mg/dL (ref <150)
Total CHOL/HDL Ratio: 3.7 (calc) (ref <5.0)

## 2017-09-21 LAB — PSA: PSA: 3 ng/mL (ref <=4.0)

## 2017-10-06 ENCOUNTER — Encounter: Payer: Self-pay | Admitting: Podiatry

## 2017-10-06 ENCOUNTER — Ambulatory Visit: Payer: Medicare Other | Admitting: Podiatry

## 2017-10-06 DIAGNOSIS — E109 Type 1 diabetes mellitus without complications: Secondary | ICD-10-CM

## 2017-10-06 DIAGNOSIS — M21619 Bunion of unspecified foot: Secondary | ICD-10-CM | POA: Diagnosis not present

## 2017-10-06 DIAGNOSIS — M79674 Pain in right toe(s): Secondary | ICD-10-CM | POA: Diagnosis not present

## 2017-10-06 DIAGNOSIS — B351 Tinea unguium: Secondary | ICD-10-CM

## 2017-10-06 DIAGNOSIS — M79675 Pain in left toe(s): Secondary | ICD-10-CM

## 2017-10-06 DIAGNOSIS — E139 Other specified diabetes mellitus without complications: Secondary | ICD-10-CM

## 2017-10-06 NOTE — Progress Notes (Signed)
Subjective:   Patient ID: Christopher Lambert, male   DOB: 62 y.o.   MRN: [id number]   HPI Patient presents with elongated nails 1-5 both feet that are thick yellow and brittle and states he has diabetes that is managed currently at this time and bunion deformity.  Patient does not smoke and likes to be active   Review of Systems  All other systems reviewed and are negative.       Objective:  Physical Exam  Constitutional: He appears well-developed and well-nourished.  Cardiovascular: Intact distal pulses.  Pulmonary/Chest: Effort normal.  Musculoskeletal: Normal range of motion.  Neurological: He is alert.  Skin: Skin is warm.  Nursing note and vitals reviewed.   Neurovascular status found to be intact with normal sharp dull vibratory currently.  He is found to have thickened yellow brittle nailbeds 1-5 both feet that are painful and moderate structural bunion deformity bilateral.  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  Wider shoes recommended and debrided nailbeds today 1-5 both feet with no iatrogenic bleeding noted

## 2017-11-22 ENCOUNTER — Encounter: Payer: Self-pay | Admitting: Internal Medicine

## 2017-11-23 ENCOUNTER — Encounter: Payer: Self-pay | Admitting: Internal Medicine

## 2017-11-23 ENCOUNTER — Ambulatory Visit: Payer: Medicare Other | Admitting: Internal Medicine

## 2017-11-23 VITALS — BP 130/78 | HR 64 | Ht 68.25 in | Wt 282.4 lb

## 2017-11-23 DIAGNOSIS — G4733 Obstructive sleep apnea (adult) (pediatric): Secondary | ICD-10-CM | POA: Diagnosis not present

## 2017-11-23 NOTE — Assessment & Plan Note (Signed)
Significant effort at normalizing weight is recommended

## 2017-11-23 NOTE — Patient Instructions (Signed)
We can continue CPAP 16, mask of choice, humidifier, supplies, AirView  Please call if we can help

## 2017-11-23 NOTE — Assessment & Plan Note (Signed)
He continues to recognize benefit from CPAP and demonstrates excellent compliance and control. Plan-continue CPAP 16

## 2017-11-23 NOTE — Progress Notes (Signed)
Patient ID: Christopher Lambert, male    DOB: 11/25/1955, 62 y.o.   MRN: [id number]  HPI male never smoker followed for OSA, rhinitis, complicated by HBP, DM 2, morbid obesity NPSG 01/31/15- severe OSA AHI 49.4/ hr, CPAP suggested 13, AHI 0.0, weight 294 lb  -------------------------------------------------------------------------------  11/23/2016- 62 year old male never smoker followed for OSA, rhinitis, complicated by HBP, DM 2, morbid obesity CPAP 16/Advanced FOLLOWS FOR: DME:[facility]. Pt wears CPAP nightly and DL attached.  Pt will need order for new supplies.  Download 100%/4 hour compliance with AHI 2.7/hour indicating great control. He definitely feels he benefits. Admits he feels tired if he doesn't use it.  11/23/17- 62 year old male never smoker followed for OSA, rhinitis, complicated by HBP, DM 2, morbid obesity CPAP 16/Advanced ----OSA: DME: [facility]. Pt wears CPAP nightly and DL attached. No new supplies needed at this time. Current machine 30 or 61 years old and working well.  Download compliance 97%, AHI 2.2/hour.  Sleeps "a whole lot better with CPAP".  ROS-see HPI + = positive Constitutional:   No-   weight loss, night sweats, fevers, chills, fatigue, lassitude. HEENT:   No-  headaches, difficulty swallowing, tooth/dental problems, sore throat,       No-  sneezing, itching, ear ache, nasal congestion, post nasal drip,  CV:  No-   chest pain, orthopnea, PND, swelling in lower extremities, anasarca, dizziness, palpitations Resp: No-   shortness of breath with exertion or at rest.              No-   productive cough,  No non-productive cough,  No- coughing up of blood.              No-   change in color of mucus.  No- wheezing.   Skin: No-   rash or lesions. GI:  No-   heartburn, indigestion, abdominal pain, nausea, vomiting, GU:  MS:  No-   joint pain or swelling.   Neuro-     nothing unusual Psych:  No- change in mood or affect. No depression or anxiety.  No memory loss.  Objective:    Physical Exam General- Alert, Oriented, Affect-appropriate, Distress- none acute, +overweight Skin- rash-none, lesions- none, excoriation- none Lymphadenopathy- none Head- atraumatic            Eyes- Gross vision intact, PERRLA, conjunctivae clear secretions            Ears- Hearing, canals-normal            Nose- Clear, no-Septal dev, mucus, polyps, erosion, perforation             Throat- Mallampati IV , mucosa clear and it seems normally moist, drainage- none, tonsils- atrophic Neck- flexible , trachea midline, no stridor , thyroid nl, carotid no bruit Chest - symmetrical excursion , unlabored           Heart/CV- RRR , no murmur , no gallop  , no rub, nl s1 s2                           - JVD- none , edema 1+, stasis changes-+, varices- none           Lung- clear to P&A, wheeze- none, cough- none , dullness-none, rub- none           Chest wall-  Abd-  Br/ Gen/ Rectal- Not done, not indicated Extrem- +scars from bilateral TKR Neuro- grossly intact to observation

## 2017-12-24 ENCOUNTER — Other Ambulatory Visit: Payer: Self-pay

## 2017-12-24 ENCOUNTER — Other Ambulatory Visit: Payer: Self-pay | Admitting: Family Medicine

## 2017-12-24 MED ORDER — BENAZEPRIL-HYDROCHLOROTHIAZIDE 20-25 MG PO TABS: 1.0000 | ORAL_TABLET | Freq: Every day | ORAL | 1 refills | Status: DC

## 2017-12-24 MED ORDER — NIFEDIPINE ER OSMOTIC RELEASE 90 MG PO TB24: 90.0000 mg | ORAL_TABLET | Freq: Every day | ORAL | 1 refills | Status: DC

## 2017-12-24 MED ORDER — SIMVASTATIN 5 MG PO TABS: 5.0000 mg | ORAL_TABLET | Freq: Every day | ORAL | 1 refills | Status: DC

## 2017-12-24 MED ORDER — METFORMIN HCL 500 MG PO TABS: ORAL_TABLET | ORAL | 1 refills | Status: DC

## 2017-12-27 ENCOUNTER — Other Ambulatory Visit: Payer: Self-pay | Admitting: Family Medicine

## 2017-12-28 ENCOUNTER — Other Ambulatory Visit: Payer: Self-pay | Admitting: Family Medicine

## 2017-12-28 ENCOUNTER — Telehealth: Payer: Self-pay | Admitting: Family Medicine

## 2017-12-28 MED ORDER — LISINOPRIL-HYDROCHLOROTHIAZIDE 20-25 MG PO TABS: 1.0000 | ORAL_TABLET | Freq: Every day | ORAL | 3 refills | Status: DC

## 2017-12-28 NOTE — Telephone Encounter (Signed)
Please let patient know that I am honoring his request to change to a lower cost but similar blood pressure medication, lisinopril/ HCTZ. I have sent in this new medication today, he will  NOT take this with his old medication benazepril/hctz

## 2017-12-30 LAB — HM DIABETES EYE EXAM

## 2018-01-05 ENCOUNTER — Other Ambulatory Visit (HOSPITAL_COMMUNITY)
Admission: RE | Admit: 2018-01-05 | Discharge: 2018-01-05 | Disposition: A | Discharge status: Home / Self Care | Payer: Medicare Other | Source: Ambulatory Visit | Attending: Family Medicine | Admitting: Family Medicine

## 2018-01-05 ENCOUNTER — Ambulatory Visit (INDEPENDENT_AMBULATORY_CARE_PROVIDER_SITE_OTHER): Payer: Medicare Other | Admitting: Podiatry

## 2018-01-05 ENCOUNTER — Encounter: Payer: Self-pay | Admitting: Podiatry

## 2018-01-05 ENCOUNTER — Encounter: Payer: Self-pay | Admitting: Family Medicine

## 2018-01-05 ENCOUNTER — Ambulatory Visit (INDEPENDENT_AMBULATORY_CARE_PROVIDER_SITE_OTHER): Payer: Medicare Other | Admitting: Family Medicine

## 2018-01-05 VITALS — BP 124/80 | HR 70 | Ht 67.5 in | Wt 292.0 lb

## 2018-01-05 DIAGNOSIS — Z1211 Encounter for screening for malignant neoplasm of colon: Secondary | ICD-10-CM

## 2018-01-05 DIAGNOSIS — M79675 Pain in left toe(s): Secondary | ICD-10-CM | POA: Diagnosis not present

## 2018-01-05 DIAGNOSIS — E1169 Type 2 diabetes mellitus with other specified complication: Secondary | ICD-10-CM

## 2018-01-05 DIAGNOSIS — B351 Tinea unguium: Secondary | ICD-10-CM

## 2018-01-05 DIAGNOSIS — E119 Type 2 diabetes mellitus without complications: Secondary | ICD-10-CM

## 2018-01-05 DIAGNOSIS — Z0000 Encounter for general adult medical examination without abnormal findings: Secondary | ICD-10-CM

## 2018-01-05 DIAGNOSIS — M79674 Pain in right toe(s): Secondary | ICD-10-CM | POA: Diagnosis not present

## 2018-01-05 DIAGNOSIS — E669 Obesity, unspecified: Secondary | ICD-10-CM

## 2018-01-05 MED ORDER — FLUTICASONE PROPIONATE 50 MCG/ACT NA SUSP: 2.0000 | Freq: Two times a day (BID) | NASAL | 1 refills | Status: DC

## 2018-01-05 NOTE — Progress Notes (Signed)
Christopher Lambert     MRN: [id number]      DOB: 05-May-1956   HPI: Patient is in for annual physical exam. Chronic health conditions are reviewed, and labs of January Are reviewed.    PE; BP 124/80 (BP Location: Left Arm, Patient Position: Sitting, Cuff Size: Large)   Pulse 70   Ht 5' 7.5" (1.715 m)   Wt 292 lb 0.6 oz (132.5 kg)   SpO2 97%   BMI 45.06 kg/m   Pleasant male, alert and oriented x 3, in no cardio-pulmonary distress. Afebrile. HEENT No facial trauma or asymetry. Sinuses non tender. EOMI, pupils equally reactive to light. External ears normal, ar. Oropharynx moist, no exudate. Neck: supple, no adenopathy,JVD or thyromegaly.No bruits.  Chest: Clear to ascultation bilaterally.No crackles or wheezes. Non tender to palpation    Cardiovascular system; Heart sounds normal,  S1 and  S2 ,no S3.  No murmur, or thrill. Apical beat not displaced Peripheral pulses normal.  Abdomen: Soft, non tender, no organomegaly or masses. No bruits. Bowel sounds normal. No guarding, tenderness or rebound.  Rectal:  Deferred , needs colonoscopy and will be referred, denies rectal bleeding or change in stool PSA normal in 09/2017    Musculoskeletal exam: Adequate  ROM of spine, hips , shoulders and knees. No deformity ,swelling or crepitus noted. No muscle wasting or atrophy.   Neurologic: Cranial nerves 2 to 12 intact. Power, tone ,sensation  normal throughout. No disturbance in gait. No tremor.  Skin: Intact, no ulceration,  rash noted. Pigmentation normal throughout  Psych; Normal mood and affect. Judgement and concentration normal   Assessment & Plan:  Annual physical exam Annual exam as documented. Counseling done  re healthy lifestyle involving commitment to 150 minutes exercise per week, heart healthy diet, and attaining healthy weight.The importance of adequate sleep also discussed.  Immunization and cancer screening needs are specifically addressed at  this visit.   Diabetes mellitus type 2 in obese (HCC) Controlled, no change in medication Christopher Lambert is reminded of the importance of commitment to daily physical activity for 30 minutes or more, as able and the need to limit carbohydrate intake to 30 to 60 grams per meal to help with blood sugar control.   The need to take medication as prescribed, test blood sugar as directed, and to call between visits if there is a concern that blood sugar is uncontrolled is also discussed.   Christopher Lambert is reminded of the importance of daily foot exam, annual eye examination, and good blood sugar, blood pressure and cholesterol control.  Diabetic Labs Latest Ref Rng & Units 01/05/2018 09/20/2017 11/02/2016 06/29/2016 11/11/2015  HbA1c <5.7 % of total Hgb - 6.0(H) 6.3(H) 6.3(H) 6.8(H)  Microalbumin Not Estab. ug/mL <3.0(H) - - - -  Micro/Creat Ratio 0.0 - 30.0 mg/g creat <1.9 - - - -  Chol <200 mg/dL - 141 137 133 124(L)  HDL >40 mg/dL - 38(L) 37(L) 28(L) 35(L)  Calc LDL mg/dL (calc) - 84 79 82 69  Triglycerides <150 mg/dL - 94 103 116 102  Creatinine 0.70 - 1.25 mg/dL - 1.06 1.06 1.17 1.01   BP/Weight 01/05/2018 11/23/2017 09/01/2017 11/23/2016 11/12/2016 07/14/2016 44/0/1027  Systolic BP 253 664 403 474 259 563 875  Diastolic BP 80 78 78 72 82 76 78  Wt. (Lbs) 292.04 282.4 286.8 295.4 288.12 293.12 290.12  BMI 45.06 42.62 43.61 44.59 43.81 44.24 43.79   Foot/eye exam completion dates Latest Ref Rng & Units 01/05/2018 09/01/2017  Eye  Exam No Retinopathy - -  Foot Form Completion - Done Done         Morbid obesity Deteriorated. Patient re-educated about  the importance of commitment to a  minimum of 150 minutes of exercise per week.  The importance of healthy food choices with portion control discussed. Encouraged to start a food diary, count calories and to consider  joining a support group. Sample diet sheets offered. Goals set by the patient for the next several months.   Weight /BMI 01/05/2018  11/23/2017 09/01/2017  WEIGHT 292 lb 0.6 oz 282 lb 6.4 oz 286 lb 12.8 oz  HEIGHT 5' 7.5" 5' 8.25" 5\' 8"   BMI 45.06 kg/m2 42.62 kg/m2 43.61 kg/m2

## 2018-01-05 NOTE — Telephone Encounter (Signed)
Pt aware.

## 2018-01-05 NOTE — Progress Notes (Signed)
Complaint:  Visit Type: Patient returns to my office for continued preventative foot care services. Complaint: Patient states" my nails have grown long and thick and become painful to walk and wear shoes" Patient has been diagnosed with DM with no foot complications. The patient presents for preventative foot care services. No changes to ROS  Podiatric Exam: Vascular: dorsalis pedis and posterior tibial pulses are palpable bilateral. Capillary return is immediate. Temperature gradient is WNL. Skin turgor WNL  Sensorium: Normal Semmes Weinstein monofilament test. Normal tactile sensation bilaterally. Nail Exam: Pt has thick disfigured discolored nails with subungual debris noted bilateral entire nail hallux through fifth toenails Ulcer Exam: There is no evidence of ulcer or pre-ulcerative changes or infection. Orthopedic Exam: Muscle tone and strength are WNL. No limitations in general ROM. No crepitus or effusions noted. Foot type and digits show no abnormalities. Bony prominences are unremarkable. Skin: No Porokeratosis. No infection or ulcers  Diagnosis:  Onychomycosis, , Pain in right toe, pain in left toes  Treatment & Plan Procedures and Treatment: Consent by patient was obtained for treatment procedures.   Debridement of mycotic and hypertrophic toenails, 1 through 5 bilateral and clearing of subungual debris. No ulceration, no infection noted.  Return Visit-Office Procedure: Patient instructed to return to the office for a follow up visit 3 months for continued evaluation and treatment.    Cayman Brogden DPM 

## 2018-01-05 NOTE — Patient Instructions (Signed)
Wellness with MD in 4 months, call if you need me sooner  Lifestyle change commitment for heart health as we have discussed  I am referring you to dr Sydell Axon for average risk screening colonoscopy  You will be referred to cardiology for re evaluation for heart disease after your next visit with me

## 2018-01-06 LAB — MICROALBUMIN / CREATININE URINE RATIO
CREATININE, UR: 161.5 mg/dL
Microalb Creat Ratio: <1.9 mg/g creat (ref 0.0–30.0)
Microalb, Ur: <3 ug/mL — ABNORMAL HIGH

## 2018-01-09 ENCOUNTER — Encounter: Payer: Self-pay | Admitting: Family Medicine

## 2018-01-09 NOTE — Assessment & Plan Note (Signed)
Deteriorated. Patient re-educated about  the importance of commitment to a  minimum of 150 minutes of exercise per week.  The importance of healthy food choices with portion control discussed. Encouraged to start a food diary, count calories and to consider  joining a support group. Sample diet sheets offered. Goals set by the patient for the next several months.   Weight /BMI 01/05/2018 11/23/2017 09/01/2017  WEIGHT 292 lb 0.6 oz 282 lb 6.4 oz 286 lb 12.8 oz  HEIGHT 5' 7.5" 5' 8.25" 5\' 8"   BMI 45.06 kg/m2 42.62 kg/m2 43.61 kg/m2

## 2018-01-09 NOTE — Assessment & Plan Note (Signed)
Controlled, no change in medication Christopher Lambert is reminded of the importance of commitment to daily physical activity for 30 minutes or more, as able and the need to limit carbohydrate intake to 30 to 60 grams per meal to help with blood sugar control.   The need to take medication as prescribed, test blood sugar as directed, and to call between visits if there is a concern that blood sugar is uncontrolled is also discussed.   Christopher Lambert is reminded of the importance of daily foot exam, annual eye examination, and good blood sugar, blood pressure and cholesterol control.  Diabetic Labs Latest Ref Rng & Units 01/05/2018 09/20/2017 11/02/2016 06/29/2016 11/11/2015  HbA1c <5.7 % of total Hgb - 6.0(H) 6.3(H) 6.3(H) 6.8(H)  Microalbumin Not Estab. ug/mL <3.0(H) - - - -  Micro/Creat Ratio 0.0 - 30.0 mg/g creat <1.9 - - - -  Chol <200 mg/dL - 141 137 133 124(L)  HDL >40 mg/dL - 38(L) 37(L) 28(L) 35(L)  Calc LDL mg/dL (calc) - 84 79 82 69  Triglycerides <150 mg/dL - 94 103 116 102  Creatinine 0.70 - 1.25 mg/dL - 1.06 1.06 1.17 1.01   BP/Weight 01/05/2018 11/23/2017 09/01/2017 11/23/2016 11/12/2016 07/14/2016 21/11/863  Systolic BP 784 696 295 284 132 440 102  Diastolic BP 80 78 78 72 82 76 78  Wt. (Lbs) 292.04 282.4 286.8 295.4 288.12 293.12 290.12  BMI 45.06 42.62 43.61 44.59 43.81 44.24 43.79   Foot/eye exam completion dates Latest Ref Rng & Units 01/05/2018 09/01/2017  Eye Exam No Retinopathy - -  Foot Form Completion - Done Done

## 2018-01-09 NOTE — Assessment & Plan Note (Signed)
Annual exam as documented. Counseling done  re healthy lifestyle involving commitment to 150 minutes exercise per week, heart healthy diet, and attaining healthy weight.The importance of adequate sleep also discussed.  Immunization and cancer screening needs are specifically addressed at this visit.  

## 2018-01-27 ENCOUNTER — Ambulatory Visit (INDEPENDENT_AMBULATORY_CARE_PROVIDER_SITE_OTHER): Payer: Self-pay

## 2018-01-27 DIAGNOSIS — Z1211 Encounter for screening for malignant neoplasm of colon: Secondary | ICD-10-CM

## 2018-01-27 MED ORDER — NA SULFATE-K SULFATE-MG SULF 17.5-3.13-1.6 GM/177ML PO SOLN: 1.0000 | ORAL | 0 refills | Status: DC

## 2018-01-27 NOTE — Progress Notes (Signed)
Gastroenterology Pre-Procedure Review  Request Date:01/27/18 Requesting Physician: Dr.[name] ( previous tcs 2009- Dr.Jenkins- no polyps)  PATIENT REVIEW QUESTIONS: The patient responded to the following health history questions as indicated:    1. Diabetes Melitis: yes (metformin) 2. Joint replacements in the past 12 months: no 3. Major health problems in the past 3 months: no 4. Has an artificial valve or MVP: no 5. Has a defibrillator: no 6. Has been advised in past to take antibiotics in advance of a procedure like teeth cleaning: yes (knee replacements 7-8 years ago) 7. Family history of colon cancer: yes (dad- mid 106's)  50. Alcohol Use: no 9. History of sleep apnea: yes (cpap)  10. History of coronary artery or other vascular stents placed within the last 12 months: no 11. History of any prior anesthesia complications: no    MEDICATIONS & ALLERGIES:    Patient reports the following regarding taking any blood thinners:   Plavix? no Aspirin? yes (81mg ) Coumadin? no Brilinta? no Xarelto? no Eliquis? no Pradaxa? no Savaysa? no Effient? no  Patient confirms/reports the following medications:  Current Outpatient Medications  Medication Sig Dispense Refill  . aspirin 81 MG EC tablet TAKE 1 TABLET EVERY DAY 150 tablet 3  . cholecalciferol (VITAMIN D) 1000 UNITS tablet Take 1,000 Units by mouth daily.      . fluticasone (FLONASE) 50 MCG/ACT nasal spray Place 2 sprays into both nostrils 2 (two) times daily. 16 g 1  . lisinopril-hydrochlorothiazide (PRINZIDE,ZESTORETIC) 20-25 MG tablet Take 1 tablet by mouth daily. Please discontinue Benazepril/ HCTZ effective 12/28/2017 due to cost 90 tablet 3  . metFORMIN (GLUCOPHAGE) 500 MG tablet TAKE 1 TABLET BY MOUTH EVERY DAY WITH BREAKFAST 90 tablet 1  . NIFEdipine (PROCARDIA XL/ADALAT-CC) 90 MG 24 hr tablet Take 1 tablet (90 mg total) by mouth daily. 90 tablet 1  . simvastatin (ZOCOR) 5 MG tablet Take 1 tablet (5 mg total) by mouth at  bedtime. 90 tablet 1   No current facility-administered medications for this visit.     Patient confirms/reports the following allergies:  No Known Allergies  No orders of the defined types were placed in this encounter.   AUTHORIZATION INFORMATION Primary Insurance: BCBS medicare,  [place] #: [id number] Pre-Cert / Josem Kaufmann required: Pre-Cert / Auth #:   SCHEDULE INFORMATION: Procedure has been scheduled as follows:  Date:03/23/18 , Time: 8:00 Location: [facility] Dr.Rourk  This Gastroenterology Pre-Precedure Review Form is being routed to the following provider(s):

## 2018-01-27 NOTE — Patient Instructions (Signed)
Christopher Lambert  12/20/1955 MRN: [id number]     Procedure Date: 03/23/18 Time to register: 7:00 Place to register: [place] [facility] Procedure Time: 8:00 Scheduled provider: R. Garfield Cornea, MD    PREPARATION FOR COLONOSCOPY WITH SUPREP BOWEL PREP KIT  Note: Suprep Bowel Prep Kit is a split-dose (2day) regimen. Consumption of BOTH 6-ounce bottles is required for a complete prep.  Please notify us immediately if you are diabetic, take iron supplements, or if you are on Coumadin or any other blood thinners.  Please hold the following medications: I will mail you a letter with this information                                                                                                                                                   2 DAYS BEFORE PROCEDURE:  DATE: 03/21/18   DAY: Monday Begin clear liquid diet AFTER your lunch meal. NO SOLID FOODS after this point.  1 DAY BEFORE PROCEDURE:  DATE: 03/22/18   DAY: Tuesday Continue clear liquids the entire day - NO SOLID FOOD.   Diabetic medications adjustments for today: see letter   At 6:00pm: Complete steps 1 through 4 below, using ONE (1) 6-ounce bottle, before going to bed. Step 1:  Pour ONE (1) 6-ounce bottle of SUPREP liquid into the mixing container.  Step 2:  Add cool drinking water to the 16 ounce line on the container and mix.  Note: Dilute the solution concentrate as directed prior to use. Step 3:  DRINK ALL the liquid in the container. Step 4:  You MUST drink an additional two (2) or more 16 ounce containers of water over the next one (1) hour.   Continue clear liquids only, until midnight. Do not eat or drink anything after midnight. EXCEPTION: If you take medications for your heart, blood pressure, or breathing, you may take these medications with a small amount of clear liquid.   DAY OF PROCEDURE:   DATE: 03/23/18   DAY: Wednesday  Diabetic medications adjustments for today:see letter  5 hours before your  procedure at 3:00am: Step 1:  Pour ONE (1) 6-ounce bottle of SUPREP liquid into the mixing container.  Step 2:  Add cool drinking water to the 16 ounce line on the container and mix.  Note: Dilute the solution concentrate as directed prior to use. Step 3:  DRINK ALL the liquid in the container. Step 4:  You MUST drink an additional two (2) or more 16 ounce containers of water over the next one (1) hour. You MUST complete the final glass of water at least 3 hours before your colonoscopy.   Nothing by mouth past 5:00am  You may take your morning medications with sip of water unless we have instructed otherwise.    Please see below for Dietary Information.  CLEAR LIQUIDS INCLUDE:  Water Jello (NOT red in color)   Ice Popsicles (NOT red in color)   Tea (sugar ok, no milk/cream) Powdered fruit flavored drinks  Coffee (sugar ok, no milk/cream) Gatorade/ Lemonade/ Kool-Aid  (NOT red in color)   Juice: apple, white grape, white cranberry Soft drinks  Clear bullion, consomme, broth (fat free beef/chicken/vegetable)  Carbonated beverages (any kind)  Strained chicken noodle soup Hard Candy   Remember: Clear liquids are liquids that will allow you to see your fingers on the other side of a clear glass. Be sure liquids are NOT red in color, and not cloudy, but CLEAR.  DO NOT EAT OR DRINK ANY OF THE FOLLOWING:  Dairy products of any kind   Cranberry juice Tomato juice / V8 juice   Grapefruit juice Orange juice     Red grape juice  Do not eat any solid foods, including such foods as: cereal, oatmeal, yogurt, fruits, vegetables, creamed soups, eggs, bread, crackers, pureed foods in a blender, etc.   HELPFUL HINTS FOR DRINKING PREP SOLUTION:   Make sure prep is extremely cold. Mix and refrigerate the the morning of the prep. You may also put in the freezer.   You may try mixing some Crystal Light or Country Time Lemonade if you prefer. Mix in small amounts; add more if necessary.  Try drinking  through a straw  Rinse mouth with water or a mouthwash between glasses, to remove after-taste.  Try sipping on a cold beverage /ice/ popsicles between glasses of prep.  Place a piece of sugar-free hard candy in mouth between glasses.  If you become nauseated, try consuming smaller amounts, or stretch out the time between glasses. Stop for 30-60 minutes, then slowly start back drinking.     OTHER INSTRUCTIONS  You will need a responsible adult at least 61 years of age to accompany you and drive you home. This person must remain in the waiting room during your procedure. The hospital will cancel your procedure if you do not have a responsible adult with you.   1. Wear loose fitting clothing that is easily removed. 2. Leave jewelry and other valuables at home.  3. Remove all body piercing jewelry and leave at home. 4. Total time from sign-in until discharge is approximately 2-3 hours. 5. You should go home directly after your procedure and rest. You can resume normal activities the day after your procedure. 6. The day of your procedure you should not:  Drive  Make legal decisions  Operate machinery  Drink alcohol  Return to work   You may call the office (Dept: [phone]) before 5:00pm, or page the doctor on call [phone]) after 5:00pm, for further instructions, if necessary.   Insurance Information YOU WILL NEED TO CHECK WITH YOUR INSURANCE COMPANY FOR THE BENEFITS OF COVERAGE YOU HAVE FOR THIS PROCEDURE.  UNFORTUNATELY, NOT ALL INSURANCE COMPANIES HAVE BENEFITS TO COVER ALL OR PART OF THESE TYPES OF PROCEDURES.  IT IS YOUR RESPONSIBILITY TO CHECK YOUR BENEFITS, HOWEVER, WE WILL BE GLAD TO ASSIST YOU WITH ANY CODES YOUR INSURANCE COMPANY MAY NEED.    PLEASE NOTE THAT MOST INSURANCE COMPANIES WILL NOT COVER A SCREENING COLONOSCOPY FOR PEOPLE UNDER THE AGE OF 50  IF YOU HAVE BCBS INSURANCE, YOU MAY HAVE BENEFITS FOR A SCREENING COLONOSCOPY BUT IF POLYPS ARE FOUND THE  DIAGNOSIS WILL CHANGE AND THEN YOU MAY HAVE A DEDUCTIBLE THAT WILL NEED TO BE MET. SO PLEASE MAKE SURE YOU CHECK YOUR BENEFITS FOR A SCREENING COLONOSCOPY AS WELL  AS A DIAGNOSTIC COLONOSCOPY.

## 2018-01-31 NOTE — Progress Notes (Signed)
Appropriate, no metformin day of procedure.

## 2018-02-01 NOTE — Progress Notes (Signed)
Letter mailed to the pt with DM instructions 

## 2018-03-16 ENCOUNTER — Ambulatory Visit: Payer: Medicare Other | Admitting: Podiatry

## 2018-03-16 ENCOUNTER — Encounter: Payer: Self-pay | Admitting: Podiatry

## 2018-03-16 DIAGNOSIS — M79675 Pain in left toe(s): Secondary | ICD-10-CM

## 2018-03-16 DIAGNOSIS — M79674 Pain in right toe(s): Secondary | ICD-10-CM

## 2018-03-16 DIAGNOSIS — E119 Type 2 diabetes mellitus without complications: Secondary | ICD-10-CM | POA: Diagnosis not present

## 2018-03-16 DIAGNOSIS — B351 Tinea unguium: Secondary | ICD-10-CM

## 2018-03-16 DIAGNOSIS — M21619 Bunion of unspecified foot: Secondary | ICD-10-CM

## 2018-03-16 NOTE — Progress Notes (Signed)
Complaint:  Visit Type: Patient returns to my office for continued preventative foot care services. Complaint: Patient states" my nails have grown long and thick and become painful to walk and wear shoes" Patient has been diagnosed with DM with no foot complications. The patient presents for preventative foot care services. No changes to ROS  Podiatric Exam: Vascular: dorsalis pedis and posterior tibial pulses are palpable bilateral. Capillary return is immediate. Temperature gradient is WNL. Skin turgor WNL  Sensorium: Normal Semmes Weinstein monofilament test. Normal tactile sensation bilaterally. Nail Exam: Pt has thick disfigured discolored nails with subungual debris noted bilateral entire nail hallux through fifth toenails Ulcer Exam: There is no evidence of ulcer or pre-ulcerative changes or infection. Orthopedic Exam: Muscle tone and strength are WNL. No limitations in general ROM. No crepitus or effusions noted. Foot type and digits show no abnormalities. Bony prominences are unremarkable. Skin: No Porokeratosis. No infection or ulcers  Diagnosis:  Onychomycosis, , Pain in right toe, pain in left toes  Treatment & Plan Procedures and Treatment: Consent by patient was obtained for treatment procedures.   Debridement of mycotic and hypertrophic toenails, 1 through 5 bilateral and clearing of subungual debris. No ulceration, no infection noted.  Return Visit-Office Procedure: Patient instructed to return to the office for a follow up visit 3 months for continued evaluation and treatment.    Taviana Westergren DPM 

## 2018-03-23 ENCOUNTER — Ambulatory Visit (HOSPITAL_COMMUNITY)
Admission: RE | Admit: 2018-03-23 | Discharge: 2018-03-23 | Disposition: A | Discharge status: Home / Self Care | Payer: Medicare Other | Source: Ambulatory Visit | Attending: Internal Medicine | Admitting: Internal Medicine

## 2018-03-23 ENCOUNTER — Encounter (HOSPITAL_COMMUNITY): Payer: Self-pay | Admitting: Emergency Medicine

## 2018-03-23 ENCOUNTER — Encounter (HOSPITAL_COMMUNITY)
Admission: RE | Disposition: A | Discharge status: Home / Self Care | Payer: Self-pay | Source: Ambulatory Visit | Attending: Internal Medicine

## 2018-03-23 ENCOUNTER — Other Ambulatory Visit: Payer: Self-pay

## 2018-03-23 DIAGNOSIS — I10 Essential (primary) hypertension: Secondary | ICD-10-CM | POA: Insufficient documentation

## 2018-03-23 DIAGNOSIS — Z7984 Long term (current) use of oral hypoglycemic drugs: Secondary | ICD-10-CM | POA: Diagnosis not present

## 2018-03-23 DIAGNOSIS — Z96653 Presence of artificial knee joint, bilateral: Secondary | ICD-10-CM | POA: Diagnosis not present

## 2018-03-23 DIAGNOSIS — D121 Benign neoplasm of appendix: Secondary | ICD-10-CM

## 2018-03-23 DIAGNOSIS — G4730 Sleep apnea, unspecified: Secondary | ICD-10-CM | POA: Insufficient documentation

## 2018-03-23 DIAGNOSIS — D124 Benign neoplasm of descending colon: Secondary | ICD-10-CM

## 2018-03-23 DIAGNOSIS — K635 Polyp of colon: Secondary | ICD-10-CM | POA: Diagnosis not present

## 2018-03-23 DIAGNOSIS — Q858 Other phakomatoses, not elsewhere classified: Secondary | ICD-10-CM | POA: Diagnosis not present

## 2018-03-23 DIAGNOSIS — Z9989 Dependence on other enabling machines and devices: Secondary | ICD-10-CM | POA: Diagnosis not present

## 2018-03-23 DIAGNOSIS — Z79899 Other long term (current) drug therapy: Secondary | ICD-10-CM | POA: Diagnosis not present

## 2018-03-23 DIAGNOSIS — E119 Type 2 diabetes mellitus without complications: Secondary | ICD-10-CM | POA: Diagnosis not present

## 2018-03-23 DIAGNOSIS — K5730 Diverticulosis of large intestine without perforation or abscess without bleeding: Secondary | ICD-10-CM | POA: Diagnosis not present

## 2018-03-23 DIAGNOSIS — Z800 Family history of malignant neoplasm of digestive organs: Secondary | ICD-10-CM | POA: Insufficient documentation

## 2018-03-23 DIAGNOSIS — K640 First degree hemorrhoids: Secondary | ICD-10-CM | POA: Insufficient documentation

## 2018-03-23 DIAGNOSIS — Z8249 Family history of ischemic heart disease and other diseases of the circulatory system: Secondary | ICD-10-CM | POA: Insufficient documentation

## 2018-03-23 DIAGNOSIS — Z1211 Encounter for screening for malignant neoplasm of colon: Secondary | ICD-10-CM | POA: Diagnosis not present

## 2018-03-23 DIAGNOSIS — Z7982 Long term (current) use of aspirin: Secondary | ICD-10-CM | POA: Insufficient documentation

## 2018-03-23 HISTORY — PX: COLONOSCOPY: SHX5424

## 2018-03-23 HISTORY — PX: POLYPECTOMY: SHX5525

## 2018-03-23 LAB — GLUCOSE, CAPILLARY: Glucose-Capillary: 108 mg/dL — ABNORMAL HIGH (ref 70–99)

## 2018-03-23 SURGERY — COLONOSCOPY
Anesthesia: Moderate Sedation

## 2018-03-23 MED ORDER — MEPERIDINE HCL 100 MG/ML IJ SOLN
INTRAMUSCULAR | Status: DC | PRN
  Administered 2018-03-23: 25 mg via INTRAVENOUS

## 2018-03-23 MED ORDER — ONDANSETRON HCL 4 MG/2ML IJ SOLN
INTRAMUSCULAR | Status: DC | PRN
  Administered 2018-03-23: 4 mg via INTRAVENOUS

## 2018-03-23 MED ORDER — ONDANSETRON HCL 4 MG/2ML IJ SOLN
INTRAMUSCULAR | Status: AC
  Filled 2018-03-23: qty 2

## 2018-03-23 MED ORDER — SIMETHICONE 40 MG/0.6ML PO SUSP
ORAL | Status: DC | PRN
  Administered 2018-03-23: 09:00:00

## 2018-03-23 MED ORDER — MIDAZOLAM HCL 5 MG/5ML IJ SOLN
INTRAMUSCULAR | Status: DC | PRN
  Administered 2018-03-23: 2 mg via INTRAVENOUS
  Administered 2018-03-23 (×3): 1 mg via INTRAVENOUS

## 2018-03-23 MED ORDER — MEPERIDINE HCL 100 MG/ML IJ SOLN
INTRAMUSCULAR | Status: AC
  Filled 2018-03-23: qty 2

## 2018-03-23 MED ORDER — SODIUM CHLORIDE 0.9 % IV SOLN
INTRAVENOUS | Status: DC
  Administered 2018-03-23: 08:00:00 via INTRAVENOUS

## 2018-03-23 MED ORDER — MIDAZOLAM HCL 5 MG/5ML IJ SOLN
INTRAMUSCULAR | Status: AC
  Filled 2018-03-23: qty 10

## 2018-03-23 NOTE — Discharge Instructions (Signed)
°Colonoscopy °Discharge Instructions ° °Read the instructions outlined below and refer to this sheet in the next few weeks. These discharge instructions provide you with general information on caring for yourself after you leave the hospital. Your doctor may also give you specific instructions. While your treatment has been planned according to the most current medical practices available, unavoidable complications occasionally occur. If you have any problems or questions after discharge, call Dr. Rourk at [phone]. °ACTIVITY °· You may resume your regular activity, but move at a slower pace for the next 24 hours.  °· Take frequent rest periods for the next 24 hours.  °· Walking will help get rid of the air and reduce the bloated feeling in your belly (abdomen).  °· No driving for 24 hours (because of the medicine (anesthesia) used during the test).   °· Do not sign any important legal documents or operate any machinery for 24 hours (because of the anesthesia used during the test).  °NUTRITION °· Drink plenty of fluids.  °· You may resume your normal diet as instructed by your doctor.  °· Begin with a light meal and progress to your normal diet. Heavy or fried foods are harder to digest and may make you feel sick to your stomach (nauseated).  °· Avoid alcoholic beverages for 24 hours or as instructed.  °MEDICATIONS °· You may resume your normal medications unless your doctor tells you otherwise.  °WHAT YOU CAN EXPECT TODAY °· Some feelings of bloating in the abdomen.  °· Passage of more gas than usual.  °· Spotting of blood in your stool or on the toilet paper.  °IF YOU HAD POLYPS REMOVED DURING THE COLONOSCOPY: °· No aspirin products for 7 days or as instructed.  °· No alcohol for 7 days or as instructed.  °· Eat a soft diet for the next 24 hours.  °FINDING OUT THE RESULTS OF YOUR TEST °Not all test results are available during your visit. If your test results are not back during the visit, make an appointment  with your caregiver to find out the results. Do not assume everything is normal if you have not heard from your caregiver or the medical facility. It is important for you to follow up on all of your test results.  °SEEK IMMEDIATE MEDICAL ATTENTION IF: °· You have more than a spotting of blood in your stool.  °· Your belly is swollen (abdominal distention).  °· You are nauseated or vomiting.  °· You have a temperature over 101.  °· You have abdominal pain or discomfort that is severe or gets worse throughout the day.  ° °Colon Polyps °Polyps are tissue growths inside the body. Polyps can grow in many places, including the large intestine (colon). A polyp may be a round bump or a mushroom-shaped growth. You could have one polyp or several. °Most colon polyps are noncancerous (benign). However, some colon polyps can become cancerous over time. °What are the causes? °The exact cause of colon polyps is not known. °What increases the risk? °This condition is more likely to develop in people who: °· Have a family history of colon cancer or colon polyps. °· Are older than 50 or older than 45 if they are African American. °· Have inflammatory bowel disease, such as ulcerative colitis or Crohn disease. °· Are overweight. °· Smoke cigarettes. °· Do not get enough exercise. °· Drink too much alcohol. °· Eat a diet that is: °? High in fat and red meat. °? Low in fiber. °·   Had childhood cancer that was treated with abdominal radiation. ° °What are the signs or symptoms? °Most polyps do not cause symptoms. If you have symptoms, they may include: °· Blood coming from your rectum when having a bowel movement. °· Blood in your stool. The stool may look dark red or black. °· A change in bowel habits, such as constipation or diarrhea. ° °How is this diagnosed? °This condition is diagnosed with a colonoscopy. This is a procedure that uses a lighted, flexible scope to look at the inside of your colon. °How is this treated? °Treatment for  this condition involves removing any polyps that are found. Those polyps will then be tested for cancer. If cancer is found, your health care provider will talk to you about options for colon cancer treatment. °Follow these instructions at home: °Diet °· Eat plenty of fiber, such as fruits, vegetables, and whole grains. °· Eat foods that are high in calcium and vitamin D, such as milk, cheese, yogurt, eggs, liver, fish, and broccoli. °· Limit foods high in fat, red meats, and processed meats, such as hot dogs, sausage, bacon, and lunch meats. °· Maintain a healthy weight, or lose weight if recommended by your health care provider. °General instructions °· Do not smoke cigarettes. °· Do not drink alcohol excessively. °· Keep all follow-up visits as told by your health care provider. This is important. This includes keeping regularly scheduled colonoscopies. Talk to your health care provider about when you need a colonoscopy. °· Exercise every day or as told by your health care provider. °Contact a health care provider if: °· You have new or worsening bleeding during a bowel movement. °· You have new or increased blood in your stool. °· You have a change in bowel habits. °· You unexpectedly lose weight. °This information is not intended to replace advice given to you by your health care provider. Make sure you discuss any questions you have with your health care provider. °Document Released: 05/27/2004 Document Revised: 02/06/2016 Document Reviewed: 07/22/2015 °Elsevier Interactive Patient Education © 2018 [employer] ° ° ° °Colon polyp information provided ° °Further recommendations to follow pending review of pathology report °

## 2018-03-23 NOTE — Op Note (Signed)
[facility] Patient Name: Christopher Lambert Procedure Date: 03/23/2018 7:17 AM MRN: [id number] Date of Birth: Sep 19, 1955 Attending MD: Norvel Richards , MD CSN: [id number] Age: 62 Admit Type: Outpatient Procedure:                Colonoscopy Indications:              Screening for colorectal malignant neoplasm Providers:                Norvel Richards, MD, Jeanann Lewandowsky. Gwenlyn Perking RN, RN,                            Aram Candela Referring MD:              Medicines:                Midazolam 5 mg IV, Meperidine 25 mg IV Complications:            No immediate complications. Estimated Blood Loss:     Estimated blood loss was minimal. Procedure:                Pre-Anesthesia Assessment:                           - Prior to the procedure, a History and Physical                            was performed, and patient medications and                            allergies were reviewed. The patient's tolerance of                            previous anesthesia was also reviewed. The risks                            and benefits of the procedure and the sedation                            options and risks were discussed with the patient.                            All questions were answered, and informed consent                            was obtained. Prior Anticoagulants: The patient has                            taken no previous anticoagulant or antiplatelet                            agents. ASA Grade Assessment: II - A patient with                            mild systemic disease. After reviewing the risks  and benefits, the patient was deemed in                            satisfactory condition to undergo the procedure.                           After obtaining informed consent, the colonoscope                            was passed under direct vision. Throughout the                            procedure, the patient's blood pressure, pulse, and           oxygen saturations were monitored continuously. The                            CF-HQ190L ([id number]) scope was introduced through                            the anus and advanced to the the cecum, identified                            by appendiceal orifice and ileocecal valve. The                            colonoscopy was performed without difficulty. The                            patient tolerated the procedure well. The quality                            of the bowel preparation was adequate. The                            ileocecal valve, appendiceal orifice, and rectum                            were photographed. The entire colon was well                            visualized. Scope In: 8:45:46 AM Scope Out: 9:04:22 AM Scope Withdrawal Time: 0 hours 8 minutes 39 seconds  Total Procedure Duration: 0 hours 18 minutes 36 seconds  Findings:      The perianal and digital rectal examinations were normal.      A 4 mm polyp was found in the appendiceal orifice. The polyp was       sessile. This was biopsied with a cold forceps for histology. Estimated       blood loss was minimal.      A 5 mm polyp was found in the descending colon. The polyp was sessile.       The polyp was removed with a cold snare. Resection and retrieval were       complete. Estimated blood loss was minimal.      A few small-mouthed diverticula  were found in the ascending colon.      Non-bleeding internal hemorrhoids were found during retroflexion. The       hemorrhoids were moderate, large and Grade I (internal hemorrhoids that       do not prolapse).      The exam was otherwise without abnormality on direct and retroflexion       views. Impression:               - One 4 mm polyp at the appendiceal orifice.                            Biopsied.                           - One 5 mm polyp in the descending colon, removed                            with a cold snare. Resected and retrieved.                            - Diverticulosis in the ascending colon.                           - Non-bleeding internal hemorrhoids.                           - The examination was otherwise normal on direct                            and retroflexion views. Moderate Sedation:      Moderate (conscious) sedation was administered by the endoscopy nurse       and supervised by the endoscopist. The following parameters were       monitored: oxygen saturation, heart rate, blood pressure, respiratory       rate, EKG, adequacy of pulmonary ventilation, and response to care.       Total physician intraservice time was 19 minutes. Recommendation:           - Patient has a contact number available for                            emergencies. The signs and symptoms of potential                            delayed complications were discussed with the                            patient. Return to normal activities tomorrow.                            Written discharge instructions were provided to the                            patient.                           - Resume previous diet.                           -  Continue present medications.                           - Repeat colonoscopy date to be determined after                            pending pathology results are reviewed for                            surveillance based on pathology results.                           - Return to GI office PRN. Procedure Code(s):        --- Professional ---                           [phone], Colonoscopy, flexible; with removal of                            tumor(s), polyp(s), or other lesion(s) by snare                            technique                           45380, 59, Colonoscopy, flexible; with biopsy,                            single or multiple                           G0500, Moderate sedation services provided by the                            same physician or other qualified health care                             professional performing a gastrointestinal                            endoscopic service that sedation supports,                            requiring the presence of an independent trained                            observer to assist in the monitoring of the                            patient's level of consciousness and physiological                            status; initial 15 minutes of intra-service time;                            patient age 61 years  or older (additional time may                            be reported with [phone], as appropriate) Diagnosis Code(s):        --- Professional ---                           Z12.11, Encounter for screening for malignant                            neoplasm of colon                           D12.1, Benign neoplasm of appendix                           D12.4, Benign neoplasm of descending colon                           K64.0, First degree hemorrhoids                           K57.30, Diverticulosis of large intestine without                            perforation or abscess without bleeding CPT copyright 2017 American Medical Association. All rights reserved. The codes documented in this report are preliminary and upon coder review may  be revised to meet current compliance requirements. Cristopher Estimable. Tarry Blayney, MD Norvel Richards, MD 03/23/2018 9:13:10 AM This report has been signed electronically. Number of Addenda: 0

## 2018-03-23 NOTE — H&P (Signed)
@LOGO @   Primary Care Physician:  [name], MD Primary Gastroenterologist:  Dr. Gala Romney  Pre-Procedure History & Physical: HPI:  Christopher Lambert is a 62 y.o. male is here for a screening colonoscopy.   Negative colonoscopy 2009. Father diagnosed with colon cancer-late 5s.  No bowel symptoms.  Past Medical History:  Diagnosis Date  . Arthritis of knee    Bilateral  . 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 ED (erectile dysfunction)   . Essential hypertension, benign 1990  . H/O: whooping cough    aschild  . Hemorrhoids, internal   . Obesity   . Seasonal allergies   . Sleep apnea 2004   wears CPAP nightly      Past Surgical History:  Procedure Laterality Date  . CARDIAC CATHETERIZATION  09/25/2011  . CARPAL TUNNEL RELEASE     Right   . HERNIA REPAIR  1275 approx   umbilical  . JOINT REPLACEMENT  2011   right knee  . JOINT REPLACEMENT  2013   left knee  . left knee replacement   sept 2013  . Right knee replacement  06/2010  . TOTAL KNEE ARTHROPLASTY  05/18/2012   Procedure: TOTAL KNEE ARTHROPLASTY;  Surgeon: Ninetta Lights, MD;  Location: [facility];  Service: Orthopedics;  Laterality: Left;  left total knee arthroplasty  . UMBILICAL HERNIA REPAIR      Prior to Admission medications   Medication Sig Start Date End Date Taking? Authorizing Provider  aspirin 81 MG EC tablet TAKE 1 TABLET EVERY DAY 02/12/14  Yes [name], MD  benazepril-hydrochlorthiazide (LOTENSIN HCT) 20-25 MG tablet Take 1 tablet by mouth daily.   Yes [provider]  cholecalciferol (VITAMIN D) 1000 UNITS tablet Take 1,000 Units by mouth daily.     Yes [provider]  fluticasone (FLONASE) 50 MCG/ACT nasal spray Place 2 sprays into both nostrils 2 (two) times daily. Patient taking differently: Place 2 sprays into both nostrils 2 (two) times daily as needed for allergies.  01/05/18  Yes [name], MD  lisinopril-hydrochlorothiazide  (PRINZIDE,ZESTORETIC) 20-25 MG tablet Take 1 tablet by mouth daily. Please discontinue Benazepril/ HCTZ effective 12/28/2017 due to cost 12/28/17 12/29/18 Yes [name], MD  metFORMIN (GLUCOPHAGE) 500 MG tablet TAKE 1 TABLET BY MOUTH EVERY DAY WITH BREAKFAST 12/24/17  Yes [name], MD  NIFEdipine (PROCARDIA XL/ADALAT-CC) 90 MG 24 hr tablet Take 1 tablet (90 mg total) by mouth daily. 12/24/17  Yes [name], MD  simvastatin (ZOCOR) 5 MG tablet Take 1 tablet (5 mg total) by mouth at bedtime. 12/24/17  Yes [name], MD    Allergies as of 01/28/2018  . (No Known Allergies)    Family History  Problem Relation Age of Onset  . Coronary artery disease Brother 48       Premature  . Hypertension Brother   . Hypertension Father   . Cancer Father 65       colon   . Hypertension Mother   . Diabetes Brother   . Hypertension Sister   . Hypertension Sister   . Hypertension Sister   . Hypertension Sister     Social History   Socioeconomic History  . Marital status: Married    Spouse name: [place]  . Number of children: 0  . Years of education: Not on file  . Highest education level: Not on file  Occupational History  Employer: [employer]  Social Needs  . Financial resource strain: Not on file  . Food insecurity:    Worry: Not on file    Inability: Not on file  . Transportation needs:    Medical: Not on file    Non-medical: Not on file  Tobacco Use  . Smoking status: Never Smoker  . Smokeless tobacco: Never Used  Substance and Sexual Activity  . Alcohol use: No  . Drug use: No  . Sexual activity: Yes  Lifestyle  . Physical activity:    Days per week: Not on file    Minutes per session: Not on file  . Stress: Not on file  Relationships  . Social connections:    Talks on phone: Not on file    Gets together: Not on file    Attends religious service: Not on file    Active member of club or organization: Not on file     Attends meetings of clubs or organizations: Not on file    Relationship status: Not on file  . Intimate partner violence:    Fear of current or ex partner: Not on file    Emotionally abused: Not on file    Physically abused: Not on file    Forced sexual activity: Not on file  Other Topics Concern  . Not on file  Social History Narrative  . Not on file    Review of Systems: See HPI, otherwise negative ROS  Physical Exam: BP 128/70   Pulse (!) 50   Temp 97.7 F (36.5 C) (Oral)   Resp 13   SpO2 100%  General:   Alert,  Well-developed, well-nourished, pleasant and cooperative in NAD Lungs:  Clear throughout to auscultation.   No wheezes, crackles, or rhonchi. No acute distress. Heart:  Regular rate and rhythm; no murmurs, clicks, rubs,  or gallops. Abdomen:  Soft, nontender and nondistended. No masses, hepatosplenomegaly or hernias noted. Normal bowel sounds, without guarding, and without rebound.    Impression/Plan: Christopher Lambert is now here to undergo a screening colonoscopy.  Average risk screening colonoscopy( family history of colon cancer but at advanced age).  Risks, benefits, limitations, imponderables and alternatives regarding colonoscopy have been reviewed with the patient. Questions have been answered. All parties agreeable.     Notice:  This dictation was prepared with Dragon dictation along with smaller phrase technology. Any transcriptional errors that result from this process are unintentional and may not be corrected upon review.

## 2018-03-25 ENCOUNTER — Encounter: Payer: Self-pay | Admitting: Internal Medicine

## 2018-03-28 ENCOUNTER — Encounter (HOSPITAL_COMMUNITY): Payer: Self-pay | Admitting: Internal Medicine

## 2018-05-09 ENCOUNTER — Encounter: Payer: Self-pay | Admitting: Family Medicine

## 2018-05-09 ENCOUNTER — Ambulatory Visit (INDEPENDENT_AMBULATORY_CARE_PROVIDER_SITE_OTHER): Payer: Medicare Other | Admitting: Family Medicine

## 2018-05-09 VITALS — BP 120/86 | HR 71 | Resp 12 | Ht 67.5 in | Wt 285.0 lb

## 2018-05-09 DIAGNOSIS — E669 Obesity, unspecified: Secondary | ICD-10-CM

## 2018-05-09 DIAGNOSIS — I10 Essential (primary) hypertension: Secondary | ICD-10-CM

## 2018-05-09 DIAGNOSIS — Z0000 Encounter for general adult medical examination without abnormal findings: Secondary | ICD-10-CM

## 2018-05-09 DIAGNOSIS — Z23 Encounter for immunization: Secondary | ICD-10-CM

## 2018-05-09 DIAGNOSIS — E8881 Metabolic syndrome: Secondary | ICD-10-CM

## 2018-05-09 DIAGNOSIS — E1169 Type 2 diabetes mellitus with other specified complication: Secondary | ICD-10-CM

## 2018-05-09 NOTE — Patient Instructions (Addendum)
Physical exam with MD end April , call if you need me before  HBA1C, chem 7 and EGFR, TSH today  Flu vaccine today  Please check and get if able the Shingles vaccins through your pharmacy starting in October  Continue healthy food choice weight loss goal of 8  to 10 pounds  Remember to know the year next time, or else!!!

## 2018-05-09 NOTE — Assessment & Plan Note (Signed)
After obtaining informed consent, the vaccine is  administered by LPN.  

## 2018-05-09 NOTE — Assessment & Plan Note (Signed)

## 2018-05-09 NOTE — Progress Notes (Signed)
Preventive Screening-Counseling & Management   Patient present here today for a Medicare annual wellness visit.   Current Problems (verified) Type 2 Diabetes, morbid obesity, hyperlipidemia, essential hypertension  Medications Prior to Visit Allergies (verified)   PAST HISTORY  Family History  Social History Married , no children, no alcohol, tobacco or drug use    Risk Factors  Current exercise habits:  Encourage to exercise daily for at least 30 minutes, 150 minutes weekly.  Dietary issues discussed: Encouraged to eat a heart healthy low fat low sodium diet   Cardiac risk factors: Hyperlipidemia, hypertension, obesity  Depression Screen  (Note: if answer to either of the following is "Yes", a more complete depression screening is indicated)   Over the past two weeks, have you felt down, depressed or hopeless? No  Over the past two weeks, have you felt little interest or pleasure in doing things? No  Have you lost interest or pleasure in daily life? No  Do you often feel hopeless? No  Do you cry easily over simple problems? No   Activities of Daily Living  In your present state of health, do you have any difficulty performing the following activities?  Driving?: No Managing money?: No Feeding yourself?:No Getting from bed to chair?:No Climbing a flight of stairs?:No Preparing food and eating?:No Bathing or showering?:No Getting dressed?:No Getting to the toilet?:No Using the toilet?:No Moving around from place to place?: No  Fall Risk Assessment In the past year have you fallen or had a near fall?:No Are you currently taking any medications that make you dizzy?:No   Hearing Difficulties: No Do you often ask people to speak up or repeat themselves?:No Do you experience ringing or noises in your ears?:No Do you have difficulty understanding soft or whispered voices?:No  Cognitive Testing  Alert? Yes Normal Appearance?Yes  Oriented to person? Yes Place? Yes   Time? Yes  Displays appropriate judgment?Yes  Can read the correct time from a watch face? yes Are you having problems remembering things?No  Advanced Directives have been discussed with the patient?Yes    List the Names of Other Physician/Practitioners you currently use:    Indicate any recent Medical Services you may have received from other than [facility] providers in the past year (date may be approximate).     Medicare Attestation  I have personally reviewed:  The patient's medical and social history  Their use of alcohol, tobacco or illicit drugs  Their current medications and supplements  The patient's functional ability including ADLs,fall risks, home safety risks, cognitive, and hearing and visual impairment  Diet and physical activities  Evidence for depression or mood disorders  The patient's weight, height, BMI, and visual acuity have been recorded in the chart. I have made referrals, counseling, and provided education to the patient based on review of the above and I have provided the patient with a written personalized care plan for preventive services.    Physical Exam BP 120/86 (BP Location: Left Arm, Patient Position: Sitting, Cuff Size: Large)   Pulse 71   Resp 12   Ht 5' 7.5" (1.715 m)   Wt 285 lb 0.6 oz (129.3 kg)   SpO2 96% Comment: room air  BMI 43.98 kg/m    Assessment & Plan:  Encounter for Medicare annual wellness exam Annual exam as documented. Counseling done  re healthy lifestyle involving commitment to 150 minutes exercise per week, heart healthy diet, and attaining healthy weight.The importance of adequate sleep also discussed. Regular seat belt use and  home safety, is also discussed. Changes in health habits are decided on by the patient with goals and time frames  set for achieving them. Immunization and cancer screening needs are specifically addressed at this visit.   Need for immunization against influenza After obtaining informed  consent, the vaccine is  administered by LPN.

## 2018-05-15 ENCOUNTER — Encounter: Payer: Self-pay | Admitting: Family Medicine

## 2018-05-21 ENCOUNTER — Other Ambulatory Visit: Payer: Self-pay | Admitting: Family Medicine

## 2018-05-23 ENCOUNTER — Ambulatory Visit: Payer: Medicare Other | Admitting: Family Medicine

## 2018-06-15 ENCOUNTER — Ambulatory Visit (INDEPENDENT_AMBULATORY_CARE_PROVIDER_SITE_OTHER): Payer: Medicare Other | Admitting: Podiatry

## 2018-06-15 ENCOUNTER — Encounter: Payer: Self-pay | Admitting: Podiatry

## 2018-06-15 ENCOUNTER — Other Ambulatory Visit: Payer: Self-pay | Admitting: Family Medicine

## 2018-06-15 DIAGNOSIS — M79675 Pain in left toe(s): Secondary | ICD-10-CM | POA: Diagnosis not present

## 2018-06-15 DIAGNOSIS — E119 Type 2 diabetes mellitus without complications: Secondary | ICD-10-CM | POA: Diagnosis not present

## 2018-06-15 DIAGNOSIS — M79674 Pain in right toe(s): Secondary | ICD-10-CM | POA: Diagnosis not present

## 2018-06-15 DIAGNOSIS — M21619 Bunion of unspecified foot: Secondary | ICD-10-CM

## 2018-06-15 DIAGNOSIS — B351 Tinea unguium: Secondary | ICD-10-CM

## 2018-06-15 NOTE — Progress Notes (Signed)
Complaint:  Visit Type: Patient returns to my office for continued preventative foot care services. Complaint: Patient states" my nails have grown long and thick and become painful to walk and wear shoes" Patient has been diagnosed with DM with no foot complications. The patient presents for preventative foot care services. No changes to ROS  Podiatric Exam: Vascular: dorsalis pedis and posterior tibial pulses are palpable bilateral. Capillary return is immediate. Temperature gradient is WNL. Skin turgor WNL  Sensorium: Normal Semmes Weinstein monofilament test. Normal tactile sensation bilaterally. Nail Exam: Pt has thick disfigured discolored nails with subungual debris noted bilateral entire nail hallux through fifth toenails Ulcer Exam: There is no evidence of ulcer or pre-ulcerative changes or infection. Orthopedic Exam: Muscle tone and strength are WNL. No limitations in general ROM. No crepitus or effusions noted. Foot type and digits show no abnormalities. HAV  B/L. Skin: No Porokeratosis. No infection or ulcers.    Diagnosis:  Onychomycosis, , Pain in right toe, pain in left toes  Treatment & Plan Procedures and Treatment: Consent by patient was obtained for treatment procedures.   Debridement of mycotic and hypertrophic toenails, 1 through 5 bilateral and clearing of subungual debris. No ulceration, no infection noted.  Return Visit-Office Procedure: Patient instructed to return to the office for a follow up visit 3 months for continued evaluation and treatment.    Macyn Remmert DPM 

## 2018-06-22 ENCOUNTER — Other Ambulatory Visit: Payer: Self-pay | Admitting: Family Medicine

## 2018-06-28 ENCOUNTER — Other Ambulatory Visit: Payer: Self-pay | Admitting: Family Medicine

## 2018-09-21 ENCOUNTER — Ambulatory Visit: Payer: Medicare Other | Admitting: Podiatry

## 2018-10-07 ENCOUNTER — Encounter: Payer: Self-pay | Admitting: Podiatry

## 2018-10-07 ENCOUNTER — Ambulatory Visit: Payer: Medicare Other | Admitting: Podiatry

## 2018-10-07 DIAGNOSIS — M79674 Pain in right toe(s): Secondary | ICD-10-CM | POA: Diagnosis not present

## 2018-10-07 DIAGNOSIS — E119 Type 2 diabetes mellitus without complications: Secondary | ICD-10-CM | POA: Diagnosis not present

## 2018-10-07 DIAGNOSIS — B351 Tinea unguium: Secondary | ICD-10-CM | POA: Diagnosis not present

## 2018-10-07 DIAGNOSIS — M21619 Bunion of unspecified foot: Secondary | ICD-10-CM

## 2018-10-07 DIAGNOSIS — M79675 Pain in left toe(s): Secondary | ICD-10-CM

## 2018-10-07 NOTE — Progress Notes (Signed)
Complaint:  Visit Type: Patient returns to my office for continued preventative foot care services. Complaint: Patient states" my nails have grown long and thick and become painful to walk and wear shoes" Patient has been diagnosed with DM with no foot complications. The patient presents for preventative foot care services. No changes to ROS  Podiatric Exam: Vascular: dorsalis pedis and posterior tibial pulses are palpable bilateral. Capillary return is immediate. Temperature gradient is WNL. Skin turgor WNL  Sensorium: Normal Semmes Weinstein monofilament test. Normal tactile sensation bilaterally. Nail Exam: Pt has thick disfigured discolored nails with subungual debris noted bilateral entire nail hallux through fifth toenails Ulcer Exam: There is no evidence of ulcer or pre-ulcerative changes or infection. Orthopedic Exam: Muscle tone and strength are WNL. No limitations in general ROM. No crepitus or effusions noted. Foot type and digits show no abnormalities. HAV  B/L. Skin: No Porokeratosis. No infection or ulcers.    Diagnosis:  Onychomycosis, , Pain in right toe, pain in left toes  Treatment & Plan Procedures and Treatment: Consent by patient was obtained for treatment procedures.   Debridement of mycotic and hypertrophic toenails, 1 through 5 bilateral and clearing of subungual debris. No ulceration, no infection noted.  Return Visit-Office Procedure: Patient instructed to return to the office for a follow up visit 3 months for continued evaluation and treatment.    Tiernan Suto DPM 

## 2018-11-21 ENCOUNTER — Encounter: Payer: Self-pay | Admitting: Internal Medicine

## 2018-11-24 ENCOUNTER — Ambulatory Visit (INDEPENDENT_AMBULATORY_CARE_PROVIDER_SITE_OTHER): Payer: Medicare Other | Admitting: Internal Medicine

## 2018-11-24 ENCOUNTER — Other Ambulatory Visit: Payer: Self-pay

## 2018-11-24 ENCOUNTER — Encounter: Payer: Self-pay | Admitting: Internal Medicine

## 2018-11-24 VITALS — BP 150/100 | HR 71 | Ht 66.75 in | Wt 274.2 lb

## 2018-11-24 DIAGNOSIS — G4733 Obstructive sleep apnea (adult) (pediatric): Secondary | ICD-10-CM

## 2018-11-24 DIAGNOSIS — J301 Allergic rhinitis due to pollen: Secondary | ICD-10-CM

## 2018-12-06 NOTE — Assessment & Plan Note (Signed)
Push for a deliberate attempt at weight loss.  Consider future bariatric referral.

## 2018-12-06 NOTE — Assessment & Plan Note (Signed)
He is comfortable managing seasonal symptoms with Flonase and antihistamines

## 2018-12-06 NOTE — Progress Notes (Signed)
    Patient ID: Christopher Lambert, male    DOB: 1956-04-12, 63 y.o.   MRN: [id number]  HPI male never smoker followed for OSA, rhinitis, complicated by HBP, DM 2, morbid obesity NPSG 01/31/15- severe OSA AHI 49.4/ hr, CPAP suggested 13, AHI 0.0, weight 294 lb  -------------------------------------------------------------------------------   11/23/17- 63 year old male never smoker followed for OSA, rhinitis, complicated by HBP, DM 2, morbid obesity CPAP 16/Advanced ----OSA: DME: [facility]. Pt wears CPAP nightly and DL attached. No new supplies needed at this time. Current machine 60 or 62 years old and working well .  Download compliance 97%, AHI 2.2/hour.  Sleeps "a whole lot better with CPAP".  11/24/2018-  63 year old male never smoker followed for OSA, rhinitis, complicated by HBP, DM 2, morbid obesity CPAP 16/Advanced Download 100% compliance, AHI 6.9/ hr Body weight today 274 lbs    BP 150/100 He reports doing well with CPAP.  Needs replacement mask and supplies. Seasonal allergies have begun.  Manages okay with Flonase and antihistamine.  ROS-see HPI + = positive Constitutional:   No-   weight loss, night sweats, fevers, chills, fatigue, lassitude. HEENT:   No-  headaches, difficulty swallowing, tooth/dental problems, sore throat,       No-  sneezing, itching, ear ache, nasal congestion, post nasal drip,  CV:  No-   chest pain, orthopnea, PND, swelling in lower extremities, anasarca, dizziness, palpitations Resp: No-   shortness of breath with exertion or at rest.              No-   productive cough,  No non-productive cough,  No- coughing up of blood.              No-   change in color of mucus.  No- wheezing.   Skin: No-   rash or lesions. GI:  No-   heartburn, indigestion, abdominal pain, nausea, vomiting, GU:  MS:  No-   joint pain or swelling.   Neuro-     nothing unusual Psych:  No- change in mood or affect. No depression or anxiety.  No memory loss.  Objective:   Physical  Exam General- Alert, Oriented, Affect-appropriate, Distress- none acute, +overweight Skin- rash-none, lesions- none, excoriation- none Lymphadenopathy- none Head- atraumatic            Eyes- Gross vision intact, PERRLA, conjunctivae clear secretions            Ears- Hearing, canals-normal            Nose- Clear, no-Septal dev, mucus, polyps, erosion, perforation             Throat- Mallampati IV , mucosa clear and it seems normally moist, drainage- none, tonsils- atrophic Neck- flexible , trachea midline, no stridor , thyroid nl, carotid no bruit Chest - symmetrical excursion , unlabored           Heart/CV- RRR , no murmur , no gallop  , no rub, nl s1 s2                           - JVD- none , edema 1+, stasis changes-+, varices- none           Lung- clear to P&A, wheeze- none, cough- none , dullness-none, rub- none           Chest wall-  Abd-  Br/ Gen/ Rectal- Not done, not indicated Extrem- +scars from bilateral TKR Neuro- grossly intact to observation

## 2018-12-06 NOTE — Assessment & Plan Note (Signed)
He continues to benefit from CPAP, with satisfactory compliance and control. Plan-continue CPAP 16, replace mask and supplies

## 2018-12-18 ENCOUNTER — Other Ambulatory Visit: Payer: Self-pay | Admitting: Family Medicine

## 2018-12-19 ENCOUNTER — Other Ambulatory Visit: Payer: Self-pay | Admitting: Family Medicine

## 2019-01-02 ENCOUNTER — Other Ambulatory Visit: Payer: Self-pay | Admitting: Family Medicine

## 2019-01-06 ENCOUNTER — Ambulatory Visit: Payer: Medicare Other | Admitting: Podiatry

## 2019-01-09 ENCOUNTER — Encounter: Payer: Medicare Other | Admitting: Family Medicine

## 2019-02-02 LAB — HM DIABETES EYE EXAM

## 2019-03-17 ENCOUNTER — Other Ambulatory Visit: Payer: Self-pay | Admitting: Family Medicine

## 2019-03-18 ENCOUNTER — Other Ambulatory Visit: Payer: Self-pay | Admitting: Family Medicine

## 2019-04-11 ENCOUNTER — Other Ambulatory Visit: Payer: Self-pay

## 2019-04-11 ENCOUNTER — Ambulatory Visit (INDEPENDENT_AMBULATORY_CARE_PROVIDER_SITE_OTHER): Payer: Medicare Other | Admitting: Family Medicine

## 2019-04-11 ENCOUNTER — Encounter: Payer: Self-pay | Admitting: Family Medicine

## 2019-04-11 ENCOUNTER — Encounter (INDEPENDENT_AMBULATORY_CARE_PROVIDER_SITE_OTHER): Payer: Self-pay

## 2019-04-11 VITALS — BP 128/72 | HR 76 | Resp 12 | Ht 67.5 in | Wt 285.1 lb

## 2019-04-11 DIAGNOSIS — E8881 Metabolic syndrome: Secondary | ICD-10-CM

## 2019-04-11 DIAGNOSIS — E1169 Type 2 diabetes mellitus with other specified complication: Secondary | ICD-10-CM

## 2019-04-11 DIAGNOSIS — Z0000 Encounter for general adult medical examination without abnormal findings: Secondary | ICD-10-CM | POA: Diagnosis not present

## 2019-04-11 DIAGNOSIS — E785 Hyperlipidemia, unspecified: Secondary | ICD-10-CM

## 2019-04-11 DIAGNOSIS — I10 Essential (primary) hypertension: Secondary | ICD-10-CM

## 2019-04-11 DIAGNOSIS — E559 Vitamin D deficiency, unspecified: Secondary | ICD-10-CM

## 2019-04-11 DIAGNOSIS — E669 Obesity, unspecified: Secondary | ICD-10-CM

## 2019-04-11 DIAGNOSIS — R972 Elevated prostate specific antigen [PSA]: Secondary | ICD-10-CM

## 2019-04-11 DIAGNOSIS — E790 Hyperuricemia without signs of inflammatory arthritis and tophaceous disease: Secondary | ICD-10-CM

## 2019-04-11 NOTE — Progress Notes (Signed)
   Christopher Lambert     MRN: [id number]      DOB: 1956-06-26   HPI: Patient is in for annual physical exam. No other health concerns are expressed or addressed at the visit. Immunization is reviewed , and  updated if needed.    PE; BP 140/80   Pulse 76   Resp 12   Ht 5' 7.5" (1.715 m)   Wt 285 lb 1.3 oz (129.3 kg)   SpO2 96%   BMI 43.99 kg/m  Pleasant male, alert and oriented x 3, in no cardio-pulmonary distress. Afebrile. HEENT No facial trauma or asymetry. Sinuses non tender. EOMI, pupils equally reactive to light. External ears normal, tympanic membranes clear. Oropharynx moist, no exudate. Neck: supple, no adenopathy,JVD or thyromegaly.No bruits.  Chest: Clear to ascultation bilaterally.No crackles or wheezes. Non tender to palpation  Breast: No asymetry,no masses. No nipple discharge or inversion. No axillary or supraclavicular adenopathy  Cardiovascular system; Heart sounds normal,  S1 and  S2 ,no S3.  No murmur, or thrill. Apical beat not displaced Peripheral pulses normal.  Abdomen: Soft, non tender, no organomegaly or masses. No bruits. Bowel sounds normal. No guarding, tenderness or rebound.     Musculoskeletal exam: Decreased though adequate ROM of spine, hips , shoulders and knees. Bilateral knee  ,swelling and  crepitus noted. No muscle wasting or atrophy.   Neurologic: Cranial nerves 2 to 12 intact. Power, tone ,sensation and reflexes normal throughout. No disturbance in gait. No tremor.  Skin: Intact, no ulceration, erythema , scaling or rash noted. Pigmentation normal throughout  Psych; Normal mood and affect. Judgement and concentration normal   Assessment & Plan:  Annual physical exam Annual exam as documented. Counseling done  re healthy lifestyle involving commitment to 150 minutes exercise per week, heart healthy diet, and attaining healthy weight.The importance of adequate sleep also discussed. Regular seat belt use and home  safety, is also discussed. Changes in health habits are decided on by the patient with goals and time frames  set for achieving them. Immunization and cancer screening needs are specifically addressed at this visit.   Morbid obesity  Patient re-educated about  the importance of commitment to a  minimum of 150 minutes of exercise per week as able.  The importance of healthy food choices with portion control discussed, as well as eating regularly and within a 12 hour window most days. The need to choose "clean , green" food 50 to 75% of the time is discussed, as well as to make water the primary drink and set a goal of 64 ounces water daily.    Weight /BMI 04/11/2019 11/24/2018 05/09/2018  WEIGHT 285 lb 1.3 oz 274 lb 3.2 oz 285 lb 0.6 oz  HEIGHT 5' 7.5" 5' 6.75" 5' 7.5"  BMI 43.99 kg/m2 43.27 kg/m2 43.98 kg/m2

## 2019-04-11 NOTE — Patient Instructions (Signed)
Wellness  In early September, flu vaccine at visit   MD f/u in 6 months, call if you need me sooner  Labs today CBC, lipid, cmp and eGFr, HBA1C, TSH, PSA, VIt D and uric acid level  Think about what you will eat, plan ahead. Choose " clean, green, fresh or frozen" over canned, processed or packaged foods which are more sugary, salty and fatty. 70 to 75% of food eaten should be vegetables and fruit. Three meals at set times with snacks allowed between meals, but they must be fruit or vegetables. Aim to eat over a 12 hour period , example 7 am to 7 pm, and STOP after  your last meal of the day. Drink water,generally about 64 ounces per day, no other drink is as healthy. Fruit juice is best enjoyed in a healthy way, by EATING the fruit.  It is important that you exercise regularly at least 30 minutes 5 times a week. If you develop chest pain, have severe difficulty breathing, or feel very tired, stop exercising immediately and seek medical attention

## 2019-04-12 ENCOUNTER — Other Ambulatory Visit: Payer: Self-pay | Admitting: Family Medicine

## 2019-04-12 ENCOUNTER — Telehealth: Payer: Self-pay

## 2019-04-12 ENCOUNTER — Telehealth: Payer: Self-pay | Admitting: Family Medicine

## 2019-04-12 DIAGNOSIS — E1169 Type 2 diabetes mellitus with other specified complication: Secondary | ICD-10-CM

## 2019-04-12 DIAGNOSIS — I10 Essential (primary) hypertension: Secondary | ICD-10-CM

## 2019-04-12 DIAGNOSIS — E790 Hyperuricemia without signs of inflammatory arthritis and tophaceous disease: Secondary | ICD-10-CM

## 2019-04-12 MED ORDER — ALLOPURINOL 300 MG PO TABS: 300.0000 mg | ORAL_TABLET | Freq: Every day | ORAL | 1 refills | Status: DC

## 2019-04-12 NOTE — Telephone Encounter (Signed)
They need to add a PSA test and need the DX

## 2019-04-12 NOTE — Progress Notes (Signed)
ALLOPURINOL 300

## 2019-04-12 NOTE — Telephone Encounter (Signed)
Labs ordered for first week of January 2021

## 2019-04-12 NOTE — Telephone Encounter (Signed)
Called and left a message with diagnostic code.

## 2019-04-13 ENCOUNTER — Other Ambulatory Visit: Payer: Self-pay | Admitting: Family Medicine

## 2019-04-13 DIAGNOSIS — R972 Elevated prostate specific antigen [PSA]: Secondary | ICD-10-CM

## 2019-04-13 LAB — CBC
HCT: 42.8 % (ref 38.5–50.0)
Hemoglobin: 15 g/dL (ref 13.2–17.1)
MCH: 31.6 pg (ref 27.0–33.0)
MCHC: 35 g/dL (ref 32.0–36.0)
MCV: 90.3 fL (ref 80.0–100.0)
MPV: 10 fL (ref 7.5–12.5)
Platelets: 222 10*3/uL (ref 140–400)
RBC: 4.74 10*6/uL (ref 4.20–5.80)
RDW: 13.1 % (ref 11.0–15.0)
WBC: 5.1 10*3/uL (ref 3.8–10.8)

## 2019-04-13 LAB — COMPLETE METABOLIC PANEL WITH GFR
AG Ratio: 1.4 (calc) (ref 1.0–2.5)
ALT: 22 U/L (ref 9–46)
AST: 19 U/L (ref 10–35)
Albumin: 4.2 g/dL (ref 3.6–5.1)
Alkaline phosphatase (APISO): 28 U/L — ABNORMAL LOW (ref 35–144)
BUN: 15 mg/dL (ref 7–25)
CO2: 29 mmol/L (ref 20–32)
Calcium: 9.6 mg/dL (ref 8.6–10.3)
Chloride: 104 mmol/L (ref 98–110)
Creat: 1.09 mg/dL (ref 0.70–1.25)
GFR, Est African American: 84 mL/min/{1.73_m2} (ref >=60)
GFR, Est Non African American: 72 mL/min/{1.73_m2} (ref >=60)
Globulin: 3.1 g/dL (calc) (ref 1.9–3.7)
Glucose, Bld: 87 mg/dL (ref 65–139)
Potassium: 3.8 mmol/L (ref 3.5–5.3)
Sodium: 143 mmol/L (ref 135–146)
Total Bilirubin: 0.5 mg/dL (ref 0.2–1.2)
Total Protein: 7.3 g/dL (ref 6.1–8.1)

## 2019-04-13 LAB — LIPID PANEL
Cholesterol: 154 mg/dL (ref <200)
HDL: 39 mg/dL — ABNORMAL LOW (ref >=40)
LDL Cholesterol (Calc): 90 mg/dL (calc)
Non-HDL Cholesterol (Calc): 115 mg/dL (calc) (ref <130)
Total CHOL/HDL Ratio: 3.9 (calc) (ref <5.0)
Triglycerides: 146 mg/dL (ref <150)

## 2019-04-13 LAB — PSA: PSA: 4.5 ng/mL — ABNORMAL HIGH (ref <=4.0)

## 2019-04-13 LAB — URIC ACID: Uric Acid, Serum: 8.1 mg/dL — ABNORMAL HIGH (ref 4.0–8.0)

## 2019-04-13 LAB — HEMOGLOBIN A1C
Hgb A1c MFr Bld: 6.4 % of total Hgb — ABNORMAL HIGH (ref <5.7)
Mean Plasma Glucose: 137 (calc)
eAG (mmol/L): 7.6 (calc)

## 2019-04-13 LAB — TEST AUTHORIZATION

## 2019-04-13 LAB — VITAMIN D 25 HYDROXY (VIT D DEFICIENCY, FRACTURES): Vit D, 25-Hydroxy: 39 ng/mL (ref 30–100)

## 2019-04-13 LAB — TSH: TSH: 2.46 mIU/L (ref 0.40–4.50)

## 2019-04-16 ENCOUNTER — Encounter: Payer: Self-pay | Admitting: Family Medicine

## 2019-04-16 ENCOUNTER — Inpatient Hospital Stay (HOSPITAL_COMMUNITY)
Admission: EM | Admit: 2019-04-16 | Discharge: 2019-04-19 | DRG: 872 | Disposition: A | Discharge status: Home / Self Care | Payer: Medicare Other | Attending: Internal Medicine | Admitting: Internal Medicine

## 2019-04-16 ENCOUNTER — Other Ambulatory Visit: Payer: Self-pay

## 2019-04-16 ENCOUNTER — Emergency Department (HOSPITAL_COMMUNITY): Payer: Medicare Other

## 2019-04-16 ENCOUNTER — Encounter (HOSPITAL_COMMUNITY): Payer: Self-pay | Admitting: Emergency Medicine

## 2019-04-16 DIAGNOSIS — N390 Urinary tract infection, site not specified: Secondary | ICD-10-CM | POA: Diagnosis present

## 2019-04-16 DIAGNOSIS — Z20828 Contact with and (suspected) exposure to other viral communicable diseases: Secondary | ICD-10-CM | POA: Diagnosis present

## 2019-04-16 DIAGNOSIS — Z8249 Family history of ischemic heart disease and other diseases of the circulatory system: Secondary | ICD-10-CM | POA: Diagnosis not present

## 2019-04-16 DIAGNOSIS — E876 Hypokalemia: Secondary | ICD-10-CM | POA: Diagnosis present

## 2019-04-16 DIAGNOSIS — E1169 Type 2 diabetes mellitus with other specified complication: Secondary | ICD-10-CM | POA: Diagnosis not present

## 2019-04-16 DIAGNOSIS — N179 Acute kidney failure, unspecified: Secondary | ICD-10-CM

## 2019-04-16 DIAGNOSIS — I10 Essential (primary) hypertension: Secondary | ICD-10-CM | POA: Diagnosis present

## 2019-04-16 DIAGNOSIS — N3090 Cystitis, unspecified without hematuria: Secondary | ICD-10-CM | POA: Diagnosis present

## 2019-04-16 DIAGNOSIS — Z7984 Long term (current) use of oral hypoglycemic drugs: Secondary | ICD-10-CM

## 2019-04-16 DIAGNOSIS — N12 Tubulo-interstitial nephritis, not specified as acute or chronic: Secondary | ICD-10-CM | POA: Diagnosis present

## 2019-04-16 DIAGNOSIS — Z7951 Long term (current) use of inhaled steroids: Secondary | ICD-10-CM | POA: Diagnosis not present

## 2019-04-16 DIAGNOSIS — R6521 Severe sepsis with septic shock: Secondary | ICD-10-CM

## 2019-04-16 DIAGNOSIS — A419 Sepsis, unspecified organism: Secondary | ICD-10-CM

## 2019-04-16 DIAGNOSIS — A4151 Sepsis due to Escherichia coli [E. coli]: Principal | ICD-10-CM | POA: Diagnosis present

## 2019-04-16 DIAGNOSIS — M1710 Unilateral primary osteoarthritis, unspecified knee: Secondary | ICD-10-CM | POA: Diagnosis present

## 2019-04-16 DIAGNOSIS — Z79899 Other long term (current) drug therapy: Secondary | ICD-10-CM | POA: Diagnosis not present

## 2019-04-16 DIAGNOSIS — E119 Type 2 diabetes mellitus without complications: Secondary | ICD-10-CM | POA: Diagnosis present

## 2019-04-16 DIAGNOSIS — Z809 Family history of malignant neoplasm, unspecified: Secondary | ICD-10-CM

## 2019-04-16 DIAGNOSIS — Z7982 Long term (current) use of aspirin: Secondary | ICD-10-CM | POA: Diagnosis not present

## 2019-04-16 DIAGNOSIS — E860 Dehydration: Secondary | ICD-10-CM | POA: Diagnosis present

## 2019-04-16 DIAGNOSIS — I959 Hypotension, unspecified: Secondary | ICD-10-CM | POA: Diagnosis present

## 2019-04-16 DIAGNOSIS — E669 Obesity, unspecified: Secondary | ICD-10-CM | POA: Diagnosis present

## 2019-04-16 DIAGNOSIS — G4733 Obstructive sleep apnea (adult) (pediatric): Secondary | ICD-10-CM | POA: Diagnosis present

## 2019-04-16 DIAGNOSIS — R509 Fever, unspecified: Secondary | ICD-10-CM

## 2019-04-16 LAB — CBC WITH DIFFERENTIAL/PLATELET
Abs Immature Granulocytes: 0.18 10*3/uL — ABNORMAL HIGH (ref 0.00–0.07)
Basophils Absolute: 0 10*3/uL (ref 0.0–0.1)
Basophils Relative: 0 %
Eosinophils Absolute: 0 10*3/uL (ref 0.0–0.5)
Eosinophils Relative: 0 %
HCT: 42.3 % (ref 39.0–52.0)
Hemoglobin: 14.5 g/dL (ref 13.0–17.0)
Immature Granulocytes: 2 %
Lymphocytes Relative: 3 %
Lymphs Abs: 0.4 10*3/uL — ABNORMAL LOW (ref 0.7–4.0)
MCH: 30.7 pg (ref 26.0–34.0)
MCHC: 34.3 g/dL (ref 30.0–36.0)
MCV: 89.4 fL (ref 80.0–100.0)
Monocytes Absolute: 0.2 10*3/uL (ref 0.1–1.0)
Monocytes Relative: 1 %
Neutro Abs: 10.6 10*3/uL — ABNORMAL HIGH (ref 1.7–7.7)
Neutrophils Relative %: 94 %
Platelets: 153 10*3/uL (ref 150–400)
RBC: 4.73 MIL/uL (ref 4.22–5.81)
RDW: 13.1 % (ref 11.5–15.5)
WBC: 11.4 10*3/uL — ABNORMAL HIGH (ref 4.0–10.5)
nRBC: 0 % (ref 0.0–0.2)

## 2019-04-16 LAB — COMPREHENSIVE METABOLIC PANEL
ALT: 30 U/L (ref 0–44)
AST: 25 U/L (ref 15–41)
Albumin: 3.5 g/dL (ref 3.5–5.0)
Alkaline Phosphatase: 83 U/L (ref 38–126)
Anion gap: 14 (ref 5–15)
BUN: 19 mg/dL (ref 8–23)
CO2: 22 mmol/L (ref 22–32)
Calcium: 8.3 mg/dL — ABNORMAL LOW (ref 8.9–10.3)
Chloride: 98 mmol/L (ref 98–111)
Creatinine, Ser: 1.38 mg/dL — ABNORMAL HIGH (ref 0.61–1.24)
GFR calc Af Amer: >60 mL/min (ref >60)
GFR calc non Af Amer: 54 mL/min — ABNORMAL LOW (ref >60)
Glucose, Bld: 162 mg/dL — ABNORMAL HIGH (ref 70–99)
Potassium: 2.7 mmol/L — CL (ref 3.5–5.1)
Sodium: 134 mmol/L — ABNORMAL LOW (ref 135–145)
Total Bilirubin: 2.1 mg/dL — ABNORMAL HIGH (ref 0.3–1.2)
Total Protein: 7.3 g/dL (ref 6.5–8.1)

## 2019-04-16 LAB — LACTIC ACID, PLASMA
Lactic Acid, Venous: 2.5 mmol/L (ref 0.5–1.9)
Lactic Acid, Venous: 1.7 mmol/L (ref 0.5–1.9)

## 2019-04-16 LAB — URINALYSIS, ROUTINE W REFLEX MICROSCOPIC
Bilirubin Urine: NEGATIVE
Glucose, UA: NEGATIVE mg/dL
Ketones, ur: NEGATIVE mg/dL
Nitrite: NEGATIVE
Protein, ur: 30 mg/dL — AB
Specific Gravity, Urine: 1.019 (ref 1.005–1.030)
WBC, UA: >50 WBC/hpf — ABNORMAL HIGH (ref 0–5)
pH: 5 (ref 5.0–8.0)

## 2019-04-16 LAB — MAGNESIUM: Magnesium: 1.7 mg/dL (ref 1.7–2.4)

## 2019-04-16 LAB — SARS CORONAVIRUS 2 BY RT PCR (HOSPITAL ORDER, PERFORMED IN ~~LOC~~ HOSPITAL LAB): SARS Coronavirus 2: NEGATIVE

## 2019-04-16 MED ORDER — SODIUM CHLORIDE 0.9 % IV SOLN: 2.0000 g | Freq: Three times a day (TID) | INTRAVENOUS | Status: DC

## 2019-04-16 MED ORDER — ACETAMINOPHEN 500 MG PO TABS
1000.0000 mg | ORAL_TABLET | Freq: Once | ORAL | Status: AC
  Administered 2019-04-16: 1000 mg via ORAL
  Filled 2019-04-16: qty 2

## 2019-04-16 MED ORDER — VANCOMYCIN HCL 1.25 G IV SOLR
1250.0000 mg | Freq: Two times a day (BID) | INTRAVENOUS | Status: DC
  Filled 2019-04-16: qty 1250

## 2019-04-16 MED ORDER — SODIUM CHLORIDE 0.9 % IV BOLUS
1000.0000 mL | Freq: Once | INTRAVENOUS | Status: AC
  Administered 2019-04-16: 1000 mL via INTRAVENOUS

## 2019-04-16 MED ORDER — SIMVASTATIN 10 MG PO TABS
5.0000 mg | ORAL_TABLET | Freq: Every day | ORAL | Status: DC
  Administered 2019-04-17 – 2019-04-18 (×2): 5 mg via ORAL
  Filled 2019-04-16 (×2): qty 1

## 2019-04-16 MED ORDER — ONDANSETRON HCL 4 MG PO TABS: 4.0000 mg | ORAL_TABLET | Freq: Four times a day (QID) | ORAL | Status: DC | PRN

## 2019-04-16 MED ORDER — MAGNESIUM SULFATE 2 GM/50ML IV SOLN
2.0000 g | INTRAVENOUS | Status: AC
  Administered 2019-04-16: 2 g via INTRAVENOUS
  Filled 2019-04-16: qty 50

## 2019-04-16 MED ORDER — SODIUM CHLORIDE 0.9 % IV SOLN
1.0000 g | INTRAVENOUS | Status: DC
  Administered 2019-04-16: 1 g via INTRAVENOUS
  Filled 2019-04-16: qty 10

## 2019-04-16 MED ORDER — ASPIRIN EC 81 MG PO TBEC
81.0000 mg | DELAYED_RELEASE_TABLET | Freq: Every day | ORAL | Status: DC
  Administered 2019-04-17 – 2019-04-19 (×3): 81 mg via ORAL
  Filled 2019-04-16 (×4): qty 1

## 2019-04-16 MED ORDER — POTASSIUM CHLORIDE 10 MEQ/100ML IV SOLN
10.0000 meq | Freq: Once | INTRAVENOUS | Status: AC
  Administered 2019-04-16: 10 meq via INTRAVENOUS
  Filled 2019-04-16: qty 100

## 2019-04-16 MED ORDER — VANCOMYCIN HCL IN DEXTROSE 1-5 GM/200ML-% IV SOLN
1000.0000 mg | Freq: Once | INTRAVENOUS | Status: DC
  Filled 2019-04-16: qty 200

## 2019-04-16 MED ORDER — INSULIN ASPART 100 UNIT/ML ~~LOC~~ SOLN: 0.0000 [IU] | Freq: Every day | SUBCUTANEOUS | Status: DC

## 2019-04-16 MED ORDER — SODIUM CHLORIDE 0.9 % IV SOLN
2.0000 g | Freq: Once | INTRAVENOUS | Status: AC
  Administered 2019-04-16: 2 g via INTRAVENOUS
  Filled 2019-04-16: qty 2

## 2019-04-16 MED ORDER — VANCOMYCIN HCL IN DEXTROSE 1-5 GM/200ML-% IV SOLN
1000.0000 mg | Freq: Once | INTRAVENOUS | Status: AC
  Administered 2019-04-16: 17:00:00 1000 mg via INTRAVENOUS

## 2019-04-16 MED ORDER — SODIUM CHLORIDE 0.9 % IV SOLN
INTRAVENOUS | Status: DC
  Administered 2019-04-16 – 2019-04-18 (×4): via INTRAVENOUS

## 2019-04-16 MED ORDER — ONDANSETRON HCL 4 MG/2ML IJ SOLN: 4.0000 mg | Freq: Four times a day (QID) | INTRAMUSCULAR | Status: DC | PRN

## 2019-04-16 MED ORDER — ENOXAPARIN SODIUM 60 MG/0.6ML ~~LOC~~ SOLN
60.0000 mg | SUBCUTANEOUS | Status: DC
  Administered 2019-04-16 – 2019-04-18 (×3): 60 mg via SUBCUTANEOUS
  Filled 2019-04-16 (×3): qty 0.6

## 2019-04-16 MED ORDER — ALLOPURINOL 300 MG PO TABS
300.0000 mg | ORAL_TABLET | Freq: Every day | ORAL | Status: DC
  Administered 2019-04-17 – 2019-04-19 (×3): 300 mg via ORAL
  Filled 2019-04-16 (×3): qty 1

## 2019-04-16 MED ORDER — POTASSIUM CHLORIDE CRYS ER 20 MEQ PO TBCR
40.0000 meq | EXTENDED_RELEASE_TABLET | Freq: Two times a day (BID) | ORAL | Status: AC
  Administered 2019-04-16 – 2019-04-17 (×3): 40 meq via ORAL
  Filled 2019-04-16 (×3): qty 2

## 2019-04-16 MED ORDER — METRONIDAZOLE IN NACL 5-0.79 MG/ML-% IV SOLN
500.0000 mg | Freq: Once | INTRAVENOUS | Status: AC
  Administered 2019-04-16: 500 mg via INTRAVENOUS
  Filled 2019-04-16: qty 100

## 2019-04-16 MED ORDER — VANCOMYCIN HCL IN DEXTROSE 1-5 GM/200ML-% IV SOLN
1000.0000 mg | Freq: Once | INTRAVENOUS | Status: AC
  Administered 2019-04-16: 1000 mg via INTRAVENOUS
  Filled 2019-04-16: qty 200

## 2019-04-16 MED ORDER — INSULIN ASPART 100 UNIT/ML ~~LOC~~ SOLN: 0.0000 [IU] | Freq: Three times a day (TID) | SUBCUTANEOUS | Status: DC

## 2019-04-16 MED ORDER — ACETAMINOPHEN 325 MG PO TABS
650.0000 mg | ORAL_TABLET | ORAL | Status: DC | PRN
  Administered 2019-04-17 – 2019-04-18 (×4): 650 mg via ORAL
  Filled 2019-04-16 (×4): qty 2

## 2019-04-16 NOTE — H&P (Signed)
History and Physical  Christopher Lambert WGN:[id number] DOB: 1956/05/09 DOA: 04/16/2019  Referring physician: Sabra Heck, MD, ED physician PCP: [name], MD  Outpatient Specialists:   Patient Coming From: home  Chief Complaint: weak, fever  HPI: Christopher Lambert is a 63 y.o. male with a history of diabetes, hypertension, obstructive sleep apnea, obesity.  Patient seen for weakness, fever, body aches for the past 48 hours.  He took some ibuprofen and felt mildly better earlier today but his symptoms are overall worsening.  Denies nausea, vomiting, diarrhea.  Has been peeing frequently with dark urine over the past 24 hours.  No dysuria.  No palliating or provoking factors.  Emergency Department Course: UA suggestive of UTI.  Patient initially hypotensive with improvement after 2 L of IV fluids.  Patient initially started on broad-spectrum antibiotics due to undifferentiated sepsis.  Lactic acid initially 2.5 and 1.7 on repeat.  Potassium 2.7.  Creatinine elevated at 1.38.  Review of Systems:   Pt denies any nausea, vomiting, diarrhea, constipation, abdominal pain, shortness of breath, dyspnea on exertion, orthopnea, cough, wheezing, palpitations, headache, vision changes, lightheadedness, dizziness, melena, rectal bleeding.  Review of systems are otherwise negative  Past Medical History:  Diagnosis Date  . Arthritis of knee    Bilateral  . 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 ED (erectile dysfunction)   . Essential hypertension, benign 1990  . H/O: whooping cough    aschild  . Hemorrhoids, internal   . Obesity   . Seasonal allergies   . Sleep apnea 2004   wears CPAP nightly     Past Surgical History:  Procedure Laterality Date  . CARDIAC CATHETERIZATION  09/25/2011  . CARPAL TUNNEL RELEASE     Right   . COLONOSCOPY N/A 03/23/2018   Procedure: COLONOSCOPY;  Surgeon: Daneil Dolin, MD;  Location[facility];  Service: Endoscopy;  Laterality: N/A;   8:00  . HERNIA REPAIR  7846 approx   umbilical  . JOINT REPLACEMENT  2011   right knee  . JOINT REPLACEMENT  2013   left knee  . left knee replacement   sept 2013  . POLYPECTOMY  03/23/2018   Procedure: POLYPECTOMY;  Surgeon: Daneil Dolin, MD;  Location: [facility];  Service: Endoscopy;;  colon  . Right knee replacement  06/2010  . TOTAL KNEE ARTHROPLASTY  05/18/2012   Procedure: TOTAL KNEE ARTHROPLASTY;  Surgeon: Ninetta Lights, MD;  Location: [facility];  Service: Orthopedics;  Laterality: Left;  left total knee arthroplasty  . UMBILICAL HERNIA REPAIR     Social History:  reports that he has never smoked. He has never used smokeless tobacco. He reports that he does not drink alcohol or use drugs. Patient lives at home  No Known Allergies  Family History  Problem Relation Age of Onset  . Coronary artery disease Brother 48       Premature  . Hypertension Brother   . Hypertension Father   . Cancer Father 48       colon   . Hypertension Mother   . Diabetes Brother   . Hypertension Sister   . Hypertension Sister   . Hypertension Sister   . Hypertension Sister       Prior to Admission medications   Medication Sig Start Date End Date Taking? Authorizing Provider  allopurinol (ZYLOPRIM) 300 MG tablet Take 1 tablet (300 mg total) by mouth daily. 04/12/19  Yes Moshe Cipro,  Norwood Levo, MD  aspirin 81 MG EC tablet TAKE 1 TABLET EVERY DAY Patient taking differently: Take 81 mg by mouth daily.  02/12/14  Yes [name], MD  cholecalciferol (VITAMIN D) 1000 UNITS tablet Take 1,000 Units by mouth daily.     Yes [provider]  ELDERBERRY PO Take 1 tablet by mouth daily.   Yes [provider]  fluticasone (FLONASE) 50 MCG/ACT nasal spray PLACE 2 SPRAYS INTO BOTH NOSTRILS 2 (TWO) TIMES DAILY. Patient taking differently: Place 2 sprays into both nostrils 2 (two) times daily as needed for allergies or rhinitis.  01/02/19  Yes [name], MD   lisinopril-hydrochlorothiazide (ZESTORETIC) 20-25 MG tablet TAKE 1 TABLET BY MOUTH DAILY. PLEASE DISCONTINUE BENAZEPRIL/ HCTZ EFFECTIVE 12/28/2017 DUE TO COST Patient taking differently: Take 1 tablet by mouth daily.  03/19/19 03/19/20 Yes Perlie Mayo, NP  metFORMIN (GLUCOPHAGE) 500 MG tablet TAKE 1 TABLET BY MOUTH EVERY DAY WITH BREAKFAST Patient taking differently: Take 500 mg by mouth daily with breakfast.  12/19/18  Yes [name], MD  Multiple Vitamin (MULTIVITAMIN WITH MINERALS) TABS tablet Take 1 tablet by mouth daily.   Yes [provider]  NIFEdipine (PROCARDIA XL/NIFEDICAL-XL) 90 MG 24 hr tablet TAKE 1 TABLET BY MOUTH EVERY DAY Patient taking differently: Take 90 mg by mouth every morning.  12/19/18  Yes [name], MD  simvastatin (ZOCOR) 5 MG tablet TAKE 1 TABLET BY MOUTH EVERYDAY AT BEDTIME Patient taking differently: Take 5 mg by mouth every morning.  03/19/19  Yes Perlie Mayo, NP    Physical Exam: BP 92/76   Pulse 84   Temp 100.2 F (37.9 C) (Oral)   Resp (!) 23   Ht 5' 7.5" (1.715 m)   Wt 127 kg   SpO2 96%   BMI 43.21 kg/m   . General: Middle-aged black male. Awake and alert and oriented x3. No acute cardiopulmonary distress.  Marland Kitchen HEENT: Normocephalic atraumatic.  Right and left ears normal in appearance.  Pupils equal, round, reactive to light. Extraocular muscles are intact. Sclerae anicteric and noninjected.  Moist mucosal membranes. No mucosal lesions.  . Neck: Neck supple without lymphadenopathy. No carotid bruits. No masses palpated.  . Cardiovascular: Regular rate with normal S1-S2 sounds. No murmurs, rubs, gallops auscultated. No JVD.  Marland Kitchen Respiratory: Good respiratory effort with no wheezes, rales, rhonchi. Lungs clear to auscultation bilaterally.  No accessory muscle use. . Abdomen: Soft, nontender, nondistended. Active bowel sounds. No masses or hepatosplenomegaly  . Skin: No rashes, lesions, or ulcerations.  Dry, warm to touch. 2+  dorsalis pedis and radial pulses. . Musculoskeletal: No calf or leg pain. All major joints not erythematous nontender.  No upper or lower joint deformation.  Good ROM.  No contractures  . Psychiatric: Intact judgment and insight. Pleasant and cooperative. . Neurologic: No focal neurological deficits. Strength is 5/5 and symmetric in upper and lower extremities.  Cranial nerves II through XII are grossly intact.           Labs on Admission: I have personally reviewed following labs and imaging studies  CBC: Recent Labs  Lab 04/11/19 1534 04/16/19 1414  WBC 5.1 11.4*  NEUTROABS  --  10.6*  HGB 15.0 14.5  HCT 42.8 42.3  MCV 90.3 89.4  PLT 222 299   Basic Metabolic Panel: Recent Labs  Lab 04/11/19 1534 04/16/19 1414  NA 143 134*  K 3.8 2.7*  CL 104 98  CO2 29 22  GLUCOSE 87  162*  BUN 15 19  CREATININE 1.09 1.38*  CALCIUM 9.6 8.3*   GFR: Estimated Creatinine Clearance: 71.6 mL/min (A) (by C-G formula based on SCr of 1.38 mg/dL (H)). Liver Function Tests: Recent Labs  Lab 04/11/19 1534 04/16/19 1414  AST 19 25  ALT 22 30  ALKPHOS  --  83  BILITOT 0.5 2.1*  PROT 7.3 7.3  ALBUMIN  --  3.5   No results for input(s): LIPASE, AMYLASE in the last 168 hours. No results for input(s): AMMONIA in the last 168 hours. Coagulation Profile: No results for input(s): INR, PROTIME in the last 168 hours. Cardiac Enzymes: No results for input(s): CKTOTAL, CKMB, CKMBINDEX, TROPONINI in the last 168 hours. BNP (last 3 results) No results for input(s): PROBNP in the last 8760 hours. HbA1C: No results for input(s): HGBA1C in the last 72 hours. CBG: No results for input(s): GLUCAP in the last 168 hours. Lipid Profile: No results for input(s): CHOL, HDL, LDLCALC, TRIG, CHOLHDL, LDLDIRECT in the last 72 hours. Thyroid Function Tests: No results for input(s): TSH, T4TOTAL, FREET4, T3FREE, THYROIDAB in the last 72 hours. Anemia Panel: No results for input(s): VITAMINB12, FOLATE,  FERRITIN, TIBC, IRON, RETICCTPCT in the last 72 hours. Urine analysis:    Component Value Date/Time   COLORURINE AMBER (A) 04/16/2019 1642   APPEARANCEUR CLOUDY (A) 04/16/2019 1642   LABSPEC 1.019 04/16/2019 1642   PHURINE 5.0 04/16/2019 1642   GLUCOSEU NEGATIVE 04/16/2019 1642   HGBUR LARGE (A) 04/16/2019 1642   BILIRUBINUR NEGATIVE 04/16/2019 1642   BILIRUBINUR small 06/22/2016 1310   KETONESUR NEGATIVE 04/16/2019 1642   PROTEINUR 30 (A) 04/16/2019 1642   UROBILINOGEN 1.0 06/22/2016 1310   UROBILINOGEN 0.2 05/12/2012 0901   NITRITE NEGATIVE 04/16/2019 1642   LEUKOCYTESUR MODERATE (A) 04/16/2019 1642   Sepsis Labs: @LABRCNTIP (procalcitonin:4,lacticidven:4) ) Recent Results (from the past 240 hour(s))  SARS Coronavirus 2 [facility] order, Performed in [facility] hospital lab) Nasopharyngeal Nasopharyngeal Swab     Status: None   Collection Time: 04/16/19  2:00 PM   Specimen: Nasopharyngeal Swab  Result Value Ref Range Status   SARS Coronavirus 2 NEGATIVE NEGATIVE Final    Comment: (NOTE) If result is NEGATIVE SARS-CoV-2 target nucleic acids are NOT DETECTED. The SARS-CoV-2 RNA is generally detectable in upper and lower  respiratory specimens during the acute phase of infection. The lowest  concentration of SARS-CoV-2 viral copies this assay can detect is 250  copies / mL. A negative result does not preclude SARS-CoV-2 infection  and should not be used as the sole basis for treatment or other  patient management decisions.  A negative result may occur with  improper specimen collection / handling, submission of specimen other  than nasopharyngeal swab, presence of viral mutation(s) within the  areas targeted by this assay, and inadequate number of viral copies  (<250 copies / mL). A negative result must be combined with clinical  observations, patient history, and epidemiological information. If result is POSITIVE SARS-CoV-2 target nucleic acids are DETECTED. The SARS-CoV-2  RNA is generally detectable in upper and lower  respiratory specimens dur ing the acute phase of infection.  Positive  results are indicative of active infection with SARS-CoV-2.  Clinical  correlation with patient history and other diagnostic information is  necessary to determine patient infection status.  Positive results do  not rule out bacterial infection or co-infection with other viruses. If result is PRESUMPTIVE POSTIVE SARS-CoV-2 nucleic acids MAY BE PRESENT.   A presumptive positive result was  obtained on the submitted specimen  and confirmed on repeat testing.  While 2019 novel coronavirus  (SARS-CoV-2) nucleic acids may be present in the submitted sample  additional confirmatory testing may be necessary for epidemiological  and / or clinical management purposes  to differentiate between  SARS-CoV-2 and other Sarbecovirus currently known to infect humans.  If clinically indicated additional testing with an alternate test  methodology [id number]) is advised. The SARS-CoV-2 RNA is generally  detectable in upper and lower respiratory sp ecimens during the acute  phase of infection. The expected result is Negative. Fact Sheet for Patients:  StrictlyIdeas.no Fact Sheet for Healthcare Providers: BankingDealers.co.za This test is not yet approved or cleared by the [place] FDA and has been authorized for detection and/or diagnosis of SARS-CoV-2 by FDA under an Emergency Use Authorization (EUA).  This EUA will remain in effect (meaning this test can be used) for the duration of the COVID-19 declaration under Section 564(b)(1) of the Act, 21 U.S.C. section 360bbb-3(b)(1), unless the authorization is terminated or revoked sooner. Performed at [facility], [address] [place], [place] [address]   Culture, blood (routine x 2)     Status: None (Preliminary result)   Collection Time: 04/16/19  2:19 PM   Specimen: Left Antecubital;  Blood  Result Value Ref Range Status   Specimen Description   Final    LEFT ANTECUBITAL BOTTLES DRAWN AEROBIC AND ANAEROBIC   Special Requests   Final    Blood Culture adequate volume Performed at [facility], [address], [place], [place] [address]    Culture PENDING  Incomplete   Report Status PENDING  Incomplete  Culture, blood (routine x 2)     Status: None (Preliminary result)   Collection Time: 04/16/19  2:29 PM   Specimen: Right Antecubital; Blood  Result Value Ref Range Status   Specimen Description   Final    RIGHT ANTECUBITAL BOTTLES DRAWN AEROBIC AND ANAEROBIC   Special Requests   Final    Blood Culture adequate volume Performed at [facility], [address], [place], [place] [address]    Culture PENDING  Incomplete   Report Status PENDING  Incomplete     Radiological Exams on Admission: Dg Chest Portable 1 View  Result Date: 04/16/2019 CLINICAL DATA:  Fever, generalized weakness and body aches EXAM: PORTABLE CHEST 1 VIEW COMPARISON:  May 18, 2012 FINDINGS: The heart size and mediastinal contours are within normal limits. Both lungs are clear. The visualized skeletal structures are unremarkable. IMPRESSION: No acute cardiopulmonary disease identified. Electronically Signed   By: Abelardo Diesel M.D.   On: 04/16/2019 14:38    EKG: Independently reviewed.  Sinus rhythm with no ST changes.  Assessment/Plan: Principal Problem:   Sepsis ([place]) Active Problems:   Essential hypertension, benign   Obstructive sleep apnea   Diabetes mellitus type 2 in obese [facility])   Acute lower UTI   Hypokalemia    This patient was discussed with the ED physician, including pertinent vitals, physical exam findings, labs, and imaging.  We also discussed care given by the ED provider.  1. Sepsis a. We will narrow broad-spectrum antibiotics to Rocephin b. Blood cultures and urine culture pending c. Patient bolused with 2 L IV fluids with good response. d. Hold antihypertensives for  now 2. Acute lower UTI a. Rocephin b. Urine cultured c. Repeat CBC in the morning 3. Hypokalemia a. We will replace potassium and check magnesium level 4. Diabetes a. We will hold metformin due to elevated  creatinine b. Sliding scale insulin c. CBGs 5. Obstructive sleep apnea a. CPAP at night 6. Hypertension a. Hold antihypertensives  DVT prophylaxis: Lovenox Consultants: None Code Status: Full code Family Communication: Wife present during exam and interview Disposition Plan: Patient should be able to return home following stabilization of sepsis and treatment of UTI   Truett Mainland, DO

## 2019-04-16 NOTE — ED Provider Notes (Addendum)
[facility] EMERGENCY DEPARTMENT Provider Note   CSN: [id number] Arrival date & time: 04/16/19  1325    History   Chief Complaint Chief Complaint  Patient presents with  . Fever    HPI Christopher Lambert is a 63 y.o. male.     Pt presents to the ED today with fever.  He said he's been sick since yesterday.  He said he feels weak and has body aches.  No cough or sob.  He last took ibuprofen around 1030.  He denies n/v/d.  No known covid contacts.     Past Medical History:  Diagnosis Date  . Arthritis of knee    Bilateral  . 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 ED (erectile dysfunction)   . Essential hypertension, benign 1990  . H/O: whooping cough    [place]  . Hemorrhoids, internal   . Obesity   . Seasonal allergies   . Sleep apnea 2004   wears CPAP nightly      Patient Active Problem List   Diagnosis Date Noted  . Sciatica, right side 11/12/2016  . Annual physical exam 07/21/2016  . Need for immunization against influenza 07/02/2015  . Piles (hemorrhoids) 06/19/2014  . Dyslipidemia, goal LDL below 100 06/19/2014  . DM type 2 with diabetic dyslipidemia ([place]) 10/15/2013  . Metabolic syndrome 20/25/4270  . Abnormal cardiovascular function study 09/21/2011  . Palpitations 08/24/2011  . Diabetes mellitus type 2 in obese ([place]) 06/26/2010  . Morbid obesity ([place]) 12/02/2007  . Essential hypertension, benign 11/09/2007  . Seasonal allergic rhinitis 11/09/2007  . Obstructive sleep apnea 11/09/2007    Past Surgical History:  Procedure Laterality Date  . CARDIAC CATHETERIZATION  09/25/2011  . CARPAL TUNNEL RELEASE     Right   . COLONOSCOPY N/A 03/23/2018   Procedure: COLONOSCOPY;  Surgeon: Daneil Dolin, MD;  Location: [facility];  Service: Endoscopy;  Laterality: N/A;  8:00  . HERNIA REPAIR  6237 approx   umbilical  . JOINT REPLACEMENT  2011   right knee  . JOINT REPLACEMENT  2013   left knee  . left knee replacement   sept 2013  .  POLYPECTOMY  03/23/2018   Procedure: POLYPECTOMY;  Surgeon: Daneil Dolin, MD;  Location: [facility];  Service: Endoscopy;;  colon  . Right knee replacement  06/2010  . TOTAL KNEE ARTHROPLASTY  05/18/2012   Procedure: TOTAL KNEE ARTHROPLASTY;  Surgeon: Ninetta Lights, MD;  Location: [facility];  Service: Orthopedics;  Laterality: Left;  left total knee arthroplasty  . UMBILICAL HERNIA REPAIR          Home Medications    Prior to Admission medications   Medication Sig Start Date End Date Taking? Authorizing Provider  allopurinol (ZYLOPRIM) 300 MG tablet Take 1 tablet (300 mg total) by mouth daily. 04/12/19   [name], MD  aspirin 81 MG EC tablet TAKE 1 TABLET EVERY DAY 02/12/14   [name], MD  cholecalciferol (VITAMIN D) 1000 UNITS tablet Take 1,000 Units by mouth daily.      [provider]  fluticasone (FLONASE) 50 MCG/ACT nasal spray PLACE 2 SPRAYS INTO BOTH NOSTRILS 2 (TWO) TIMES DAILY. 01/02/19   [name], MD  lisinopril-hydrochlorothiazide (ZESTORETIC) 20-25 MG tablet TAKE 1 TABLET BY MOUTH DAILY. PLEASE DISCONTINUE BENAZEPRIL/ HCTZ EFFECTIVE 12/28/2017 DUE TO COST 03/19/19 03/19/20  Perlie Mayo, NP  metFORMIN (GLUCOPHAGE) 500 MG tablet TAKE 1 TABLET  BY MOUTH EVERY DAY WITH BREAKFAST 12/19/18   [name], MD  NIFEdipine (PROCARDIA XL/NIFEDICAL-XL) 90 MG 24 hr tablet TAKE 1 TABLET BY MOUTH EVERY DAY 12/19/18   [name], MD  simvastatin (ZOCOR) 5 MG tablet TAKE 1 TABLET BY MOUTH EVERYDAY AT BEDTIME 03/19/19   Perlie Mayo, NP    Family History Family History  Problem Relation Age of Onset  . Coronary artery disease Brother 48       Premature  . Hypertension Brother   . Hypertension Father   . Cancer Father 30       colon   . Hypertension Mother   . Diabetes Brother   . Hypertension Sister   . Hypertension Sister   . Hypertension Sister   . Hypertension Sister     Social History Social History   Tobacco Use  .  Smoking status: Never Smoker  . Smokeless tobacco: Never Used  Substance Use Topics  . Alcohol use: No  . Drug use: No     Allergies   Patient has no known allergies.   Review of Systems Review of Systems  Constitutional: Positive for chills and fatigue.  Musculoskeletal: Positive for arthralgias and myalgias.  Neurological: Positive for weakness.  All other systems reviewed and are negative.    Physical Exam Updated Vital Signs BP 100/65   Pulse 94   Temp (S) (!) 103 F (39.4 C) (Oral)   Resp 15   Ht 5' 7.5" (1.715 m)   Wt 127 kg   SpO2 96%   BMI 43.21 kg/m   Physical Exam Vitals signs and nursing note reviewed.  Constitutional:      Appearance: Normal appearance. He is obese.  HENT:     Head: Normocephalic and atraumatic.     Right Ear: External ear normal.     Left Ear: External ear normal.     Nose: Nose normal.     Mouth/Throat:     Mouth: Mucous membranes are dry.  Eyes:     Extraocular Movements: Extraocular movements intact.     Conjunctiva/sclera: Conjunctivae normal.     Pupils: Pupils are equal, round, and reactive to light.  Neck:     Musculoskeletal: Normal range of motion and neck supple.  Cardiovascular:     Rate and Rhythm: Regular rhythm. Tachycardia present.     Pulses: Normal pulses.     Heart sounds: Normal heart sounds.  Pulmonary:     Effort: Pulmonary effort is normal.     Breath sounds: Normal breath sounds.  Abdominal:     General: Abdomen is flat. Bowel sounds are normal.     Palpations: Abdomen is soft.  Musculoskeletal: Normal range of motion.  Skin:    General: Skin is warm.     Capillary Refill: Capillary refill takes less than 2 seconds.  Neurological:     General: No focal deficit present.     Mental Status: He is alert and oriented to person, place, and time.  Psychiatric:        Mood and Affect: Mood normal.        Behavior: Behavior normal.        Thought Content: Thought content normal.        Judgment:  Judgment normal.      ED Treatments / Results  Labs (all labs ordered are listed, but only abnormal results are displayed) Labs Reviewed  CBC WITH DIFFERENTIAL/PLATELET - Abnormal; Notable for the following components:  Result Value   WBC 11.4 (*)    Neutro Abs 10.6 (*)    Lymphs Abs 0.4 (*)    Abs Immature Granulocytes 0.18 (*)    All other components within normal limits  URINE CULTURE  CULTURE, BLOOD (ROUTINE X 2)  CULTURE, BLOOD (ROUTINE X 2)  SARS CORONAVIRUS 2 (HOSPITAL ORDER, [place])  COMPREHENSIVE METABOLIC PANEL  URINALYSIS, ROUTINE W REFLEX MICROSCOPIC  LACTIC ACID, PLASMA  LACTIC ACID, PLASMA    EKG None  Radiology Dg Chest Portable 1 View  Result Date: 04/16/2019 CLINICAL DATA:  Fever, generalized weakness and body aches EXAM: PORTABLE CHEST 1 VIEW COMPARISON:  May 18, 2012 FINDINGS: The heart size and mediastinal contours are within normal limits. Both lungs are clear. The visualized skeletal structures are unremarkable. IMPRESSION: No acute cardiopulmonary disease identified. Electronically Signed   By: Abelardo Diesel M.D.   On: 04/16/2019 14:38    Procedures Procedures (including critical care time)  Medications Ordered in ED Medications  sodium chloride 0.9 % bolus 1,000 mL (1,000 mLs Intravenous New Bag/Given 04/16/19 1422)  acetaminophen (TYLENOL) tablet 1,000 mg (1,000 mg Oral Given 04/16/19 1435)  sodium chloride 0.9 % bolus 1,000 mL (1,000 mLs Intravenous New Bag/Given 04/16/19 1452)     Initial Impression / Assessment and Plan / ED Course  I have reviewed the triage vital signs and the nursing notes.  Pertinent labs & imaging results that were available during my care of the patient were reviewed by me and considered in my medical decision making (see chart for details).  Clinical Course as of Apr 17 1530  Sun Apr 16, 2019  1722 UTI present.   [BM]    Clinical Course User Index [BM] Noemi Chapel, MD    Pt's fever treated with tylenol.  The pt's bp a little soft, so he was given IVFs.  Other labs are pending.  Pt signed out to Dr. Sabra Heck at shift change.  Final Clinical Impressions(s) / ED Diagnoses   Final diagnoses:  Dehydration  Fever in adult    ED Discharge Orders    None       Isla Pence, MD 04/16/19 1459    Isla Pence, MD 04/18/19 1531

## 2019-04-16 NOTE — ED Provider Notes (Addendum)
The patient has several lab abnormalities including a leukocytosis, hyponatremia and an elevated lactic acid.  There is a slight elevation in creatinine at 1.38 which is new since several days ago, coronavirus negative and a chest x-ray which is unremarkable.  We will proceed with ongoing fluid resuscitation, blood pressure is 108/71, antibiotics have been ordered for code sepsis which I activated at 3:45 PM, unsure of source at this time.   Urinalysis is the obvious source of infection Pt has been given antibiotics I discussed with Dr. Nehemiah Settle Initial lactic was elevated, at 2.5  On repeat exam, looks better, heart rate, BP and fever improved,  Sepsis - Repeat Assessment  Performed at:    5:49 PM   Vitals     Blood pressure 92/76, pulse 84, temperature 100.2 F (37.9 C), temperature source Oral, resp. rate (!) 23, height 1.715 m (5' 7.5"), weight 127 kg, SpO2 96 %.  Heart:     Regular rate and rhythm  Lungs:    CTA  Capillary Refill:   <2 sec  Peripheral Pulse:   Radial pulse palpable  Skin:     Normal Color     Noemi Chapel, MD 04/16/19 1548    Noemi Chapel, MD 04/16/19 [phone]

## 2019-04-16 NOTE — ED Notes (Addendum)
Date and time results received: 04/16/19 3:31 PM   Test: potassium Critical Value: 2.7  Name of Provider Notified: miller md  Orders Received? Or Actions Taken?: na

## 2019-04-16 NOTE — ED Notes (Addendum)
Date and time results received: 04/16/19 3:33 PM  (use smartphrase ".now" to insert current time)  Test: lactic acid Critical Value: 2.5  Name of Provider Notified: miller md  Orders Received? Or Actions Taken?: na

## 2019-04-16 NOTE — Assessment & Plan Note (Signed)
  Patient re-educated about  the importance of commitment to a  minimum of 150 minutes of exercise per week as able.  The importance of healthy food choices with portion control discussed, as well as eating regularly and within a 12 hour window most days. The need to choose "clean , green" food 50 to 75% of the time is discussed, as well as to make water the primary drink and set a goal of 64 ounces water daily.    Weight /BMI 04/11/2019 11/24/2018 05/09/2018  WEIGHT 285 lb 1.3 oz 274 lb 3.2 oz 285 lb 0.6 oz  HEIGHT 5' 7.5" 5' 6.75" 5' 7.5"  BMI 43.99 kg/m2 43.27 kg/m2 43.98 kg/m2

## 2019-04-16 NOTE — ED Triage Notes (Signed)
Patient c/o fevers with generalized weakness and body aches. Denies any cough, shortness of breath, nausea, vomiting, or diarrhea. Per patient decreased urine output. Patient states fevers 102-104. Patient taking advil, last this morning at 10:30.

## 2019-04-16 NOTE — Progress Notes (Signed)
Pharmacy Antibiotic Note  Christopher Lambert is a 63 y.o. male admitted on 04/16/2019 with infection- unknown source.  Pharmacy has been consulted for Vancomycin and Cefepime dosing.  Plan: Vancomycin 2000 mg IV x 1 dose. Vancomycin 1250 mg IV every 12 hours.  Goal trough 15-20 mcg/mL.  Cefepime 2000 mg IV every 8 hours. Monitor labs, c/s, and vanco levels as indicated.  Height: 5' 7.5" (171.5 cm) Weight: 280 lb (127 kg) IBW/kg (Calculated) : 67.25  Temp (24hrs), Avg:101.4 F (38.6 C), Min:100.2 F (37.9 C), Max:103 F (39.4 C)  Recent Labs  Lab 04/11/19 1534 04/16/19 1414 04/16/19 1419 04/16/19 1607  WBC 5.1 11.4*  --   --   CREATININE 1.09 1.38*  --   --   LATICACIDVEN  --   --  2.5* 1.7    Estimated Creatinine Clearance: 71.6 mL/min (A) (by C-G formula based on SCr of 1.38 mg/dL (H)).    No Known Allergies  Antimicrobials this admission: Vanco 8/2 >>  Cefepime 8/2 >>  Flagyl 8/2 >>  Dose adjustments this admission: N/A  Microbiology results: 8/2 BCx: pending 8/2 UCx: pending    Thank you for allowing pharmacy to be a part of this patient's care.  Ramond Craver 04/16/2019 7:12 PM

## 2019-04-16 NOTE — ED Notes (Signed)
ED TO INPATIENT HANDOFF REPORT  ED Nurse Name and Phone #: Torey Reinard [phone]  S Name/Age/Gender Christopher Lambert 63 y.o. male Room/Bed: APA07/APA07  Code Status   Code Status: Not on file  Home/SNF/Other Home Patient oriented to: self, place, time and situation Is this baseline? Yes   Triage Complete: Triage complete  Chief Complaint fever/shakes  Triage Note Patient c/o fevers with generalized weakness and body aches. Denies any cough, shortness of breath, nausea, vomiting, or diarrhea. Per patient decreased urine output. Patient states fevers 102-104. Patient taking advil, last this morning at 10:30.   Allergies No Known Allergies  Level of Care/Admitting Diagnosis ED Disposition    ED Disposition Condition [facility]: [facility] [[id number]]  Level of Care: Telemetry [5]  Covid Evaluation: Confirmed COVID Negative  Diagnosis: Sepsis [facility]) [[id number]]  Admitting Physician: Truett Mainland [[id number]]  Attending Physician: Truett Mainland [[id number]]  Estimated length of stay: past midnight tomorrow  Certification:: I certify this patient will need inpatient services for at least 2 midnights  PT Class (Do Not Modify): Inpatient [101]  PT Acc Code (Do Not Modify): Private [1]       B Medical/Surgery History Past Medical History:  Diagnosis Date  . Arthritis of knee    Bilateral  . 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 ED (erectile dysfunction)   . Essential hypertension, benign 1990  . H/O: whooping cough    aschild  . Hemorrhoids, internal   . Obesity   . Seasonal allergies   . Sleep apnea 2004   wears CPAP nightly     Past Surgical History:  Procedure Laterality Date  . CARDIAC CATHETERIZATION  09/25/2011  . CARPAL TUNNEL RELEASE     Right   . COLONOSCOPY N/A 03/23/2018   Procedure: COLONOSCOPY;  Surgeon: Daneil Dolin, MD;  [facility];  Service: Endoscopy;  Laterality: N/A;  8:00  . HERNIA  REPAIR  [id number] approx   umbilical  . JOINT REPLACEMENT  2011   right knee  . JOINT REPLACEMENT  2013   left knee  . left knee replacement   sept 2013  . POLYPECTOMY  03/23/2018   Procedure: POLYPECTOMY;  Surgeon: Daneil Dolin, MD;  [facility];  Service: Endoscopy;;  colon  . Right knee replacement  06/2010  . TOTAL KNEE ARTHROPLASTY  05/18/2012   Procedure: TOTAL KNEE ARTHROPLASTY;  Surgeon: Ninetta Lights, MD;  Location: [place];  Service: Orthopedics;  Laterality: Left;  left total knee arthroplasty  . UMBILICAL HERNIA REPAIR       A IV Location/Drains/Wounds Patient Lines/Drains/Airways Status   Active Line/Drains/Airways    Name:   Placement date:   Placement time:   Site:   Days:   Peripheral IV 04/16/19 Left Antecubital   04/16/19    1422    Antecubital   less than 1   Peripheral IV 04/16/19 Right Antecubital   04/16/19    1654    Antecubital   less than 1   Incision 05/18/12 Knee Left   05/18/12    1525     2524          Intake/Output Last 24 hours  Intake/Output Summary (Last 24 hours) at 04/16/2019 2045 Last data filed at 04/16/2019 1900 Gross per 24 hour  Intake 200 ml  Output -  Net 200 ml    Labs/Imaging Results for orders placed  or performed during the hospital encounter of 04/16/19 (from the past 48 hour(s))  SARS Coronavirus 2 [facility] order, Performed in [facility] hospital lab) Nasopharyngeal Nasopharyngeal Swab     Status: None   Collection Time: 04/16/19  2:00 PM   Specimen: Nasopharyngeal Swab  Result Value Ref Range   SARS Coronavirus 2 NEGATIVE NEGATIVE    Comment: (NOTE) If result is NEGATIVE SARS-CoV-2 target nucleic acids are NOT DETECTED. The SARS-CoV-2 RNA is generally detectable in upper and lower  respiratory specimens during the acute phase of infection. The lowest  concentration of SARS-CoV-2 viral copies this assay can detect is 250  copies / mL. A negative result does not preclude SARS-CoV-2 infection  and should not be used  as the sole basis for treatment or other  patient management decisions.  A negative result may occur with  improper specimen collection / handling, submission of specimen other  than nasopharyngeal swab, presence of viral mutation(s) within the  areas targeted by this assay, and inadequate number of viral copies  (<250 copies / mL). A negative result must be combined with clinical  observations, patient history, and epidemiological information. If result is POSITIVE SARS-CoV-2 target nucleic acids are DETECTED. The SARS-CoV-2 RNA is generally detectable in upper and lower  respiratory specimens dur ing the acute phase of infection.  Positive  results are indicative of active infection with SARS-CoV-2.  Clinical  correlation with patient history and other diagnostic information is  necessary to determine patient infection status.  Positive results do  not rule out bacterial infection or co-infection with other viruses. If result is PRESUMPTIVE POSTIVE SARS-CoV-2 nucleic acids MAY BE PRESENT.   A presumptive positive result was obtained on the submitted specimen  and confirmed on repeat testing.  While 2019 novel coronavirus  (SARS-CoV-2) nucleic acids may be present in the submitted sample  additional confirmatory testing may be necessary for epidemiological  and / or clinical management purposes  to differentiate between  SARS-CoV-2 and other Sarbecovirus currently known to infect humans.  If clinically indicated additional testing with an alternate test  methodology [phone]) is advised. The SARS-CoV-2 RNA is generally  detectable in upper and lower respiratory sp ecimens during the acute  phase of infection. The expected result is Negative. Fact Sheet for Patients:  StrictlyIdeas.no Fact Sheet for Healthcare Providers: BankingDealers.co.za This test is not yet approved or cleared by the [place] FDA and has been authorized for  detection and/or diagnosis of SARS-CoV-2 by FDA under an Emergency Use Authorization (EUA).  This EUA will remain in effect (meaning this test can be used) for the duration of the COVID-19 declaration under Section 564(b)(1) of the Act, 21 U.S.C. section 360bbb-3(b)(1), unless the authorization is terminated or revoked sooner. Performed at [facility], [address] [place], [place] [address]   Comprehensive metabolic panel     Status: Abnormal   Collection Time: 04/16/19  2:14 PM  Result Value Ref Range   Sodium 134 (L) 135 - 145 mmol/L   Potassium 2.7 (LL) 3.5 - 5.1 mmol/L    Comment: CRITICAL RESULT CALLED TO, READ BACK BY AND VERIFIED WITH: K. ELIS,RN  @1531  08/02/2020KAY    Chloride 98 98 - 111 mmol/L   CO2 22 22 - 32 mmol/L   Glucose, Bld 162 (H) 70 - 99 mg/dL   BUN 19 8 - 23 mg/dL   Creatinine, Ser 1.38 (H) 0.61 - 1.24 mg/dL   Calcium 8.3 (L) 8.9 - 10.3 mg/dL   Total  Protein 7.3 6.5 - 8.1 g/dL   Albumin 3.5 3.5 - 5.0 g/dL   AST 25 15 - 41 U/L   ALT 30 0 - 44 U/L   Alkaline Phosphatase 83 38 - 126 U/L   Total Bilirubin 2.1 (H) 0.3 - 1.2 mg/dL   GFR calc non Af Amer 54 (L) >60 mL/min   GFR calc Af Amer >60 >60 mL/min   Anion gap 14 5 - 15    Comment: Performed at [facility], [address] [place], [place] [address]  CBC with Differential     Status: Abnormal   Collection Time: 04/16/19  2:14 PM  Result Value Ref Range   WBC 11.4 (H) 4.0 - 10.5 K/uL   RBC 4.73 4.22 - 5.81 MIL/uL   Hemoglobin 14.5 13.0 - 17.0 g/dL   HCT 42.3 39.0 - 52.0 %   MCV 89.4 80.0 - 100.0 fL   MCH 30.7 26.0 - 34.0 pg   MCHC 34.3 30.0 - 36.0 g/dL   RDW 13.1 11.5 - 15.5 %   Platelets 153 150 - 400 K/uL   nRBC 0.0 0.0 - 0.2 %   Neutrophils Relative % 94 %   Neutro Abs 10.6 (H) 1.7 - 7.7 K/uL   Lymphocytes Relative 3 %   Lymphs Abs 0.4 (L) 0.7 - 4.0 K/uL   Monocytes Relative 1 %   Monocytes Absolute 0.2 0.1 - 1.0 K/uL   Eosinophils Relative 0 %   Eosinophils Absolute 0.0 0.0 - 0.5 K/uL    Basophils Relative 0 %   Basophils Absolute 0.0 0.0 - 0.1 K/uL   Immature Granulocytes 2 %   Abs Immature Granulocytes 0.18 (H) 0.00 - 0.07 K/uL    Comment: Performed at [facility], [address] [place], [place] [address]  Culture, blood (routine x 2)     Status: None (Preliminary result)   Collection Time: 04/16/19  2:19 PM   Specimen: Left Antecubital; Blood  Result Value Ref Range   Specimen Description      LEFT ANTECUBITAL BOTTLES DRAWN AEROBIC AND ANAEROBIC   Special Requests      Blood Culture adequate volume Performed at [facility], [address] [place], [place] [address]    Culture PENDING    Report Status PENDING   Lactic acid, plasma     Status: Abnormal   Collection Time: 04/16/19  2:19 PM  Result Value Ref Range   Lactic Acid, Venous 2.5 (HH) 0.5 - 1.9 mmol/L    Comment: CRITICAL RESULT CALLED TO, READ BACK BY AND VERIFIED WITH: K. ELIS @1532  08/02/2020KAY Performed at [facility], [address], [place], [place] [address]   Magnesium     Status: None   Collection Time: 04/16/19  2:19 PM  Result Value Ref Range   Magnesium 1.7 1.7 - 2.4 mg/dL    Comment: Performed at [facility], [address], [place], [place] [address]  Culture, blood (routine x 2)     Status: None (Preliminary result)   Collection Time: 04/16/19  2:29 PM   Specimen: Right Antecubital; Blood  Result Value Ref Range   Specimen Description      RIGHT ANTECUBITAL BOTTLES DRAWN AEROBIC AND ANAEROBIC   Special Requests      Blood Culture adequate volume Performed at [facility], [address] [place], [place] [address]    Culture PENDING    Report Status PENDING   Lactic acid, plasma     Status: None   Collection Time: 04/16/19  4:07 PM  Result Value Ref Range   Lactic Acid, Venous 1.7 0.5 - 1.9 mmol/L    Comment: Performed at [facility], [address], [place], [place] [address]  Urinalysis, Routine w reflex microscopic     Status: Abnormal   Collection Time: 04/16/19  4:42  PM  Result Value Ref Range   Color, Urine AMBER (A) YELLOW    Comment: BIOCHEMICALS MAY BE AFFECTED BY COLOR   APPearance CLOUDY (A) CLEAR   Specific Gravity, Urine 1.019 1.005 - 1.030   pH 5.0 5.0 - 8.0   Glucose, UA NEGATIVE NEGATIVE mg/dL   Hgb urine dipstick LARGE (A) NEGATIVE   Bilirubin Urine NEGATIVE NEGATIVE   Ketones, ur NEGATIVE NEGATIVE mg/dL   Protein, ur 30 (A) NEGATIVE mg/dL   Nitrite NEGATIVE NEGATIVE   Leukocytes,Ua MODERATE (A) NEGATIVE   RBC / HPF 21-50 0 - 5 RBC/hpf   WBC, UA >50 (H) 0 - 5 WBC/hpf   Bacteria, UA MANY (A) NONE SEEN   Squamous Epithelial / LPF 0-5 0 - 5   WBC Clumps PRESENT    Mucus PRESENT     Comment: Performed at [facility], [address], [place], [place] [address]   Dg Chest Portable 1 View  Result Date: 04/16/2019 CLINICAL DATA:  Fever, generalized weakness and body aches EXAM: PORTABLE CHEST 1 VIEW COMPARISON:  May 18, 2012 FINDINGS: The heart size and mediastinal contours are within normal limits. Both lungs are clear. The visualized skeletal structures are unremarkable. IMPRESSION: No acute cardiopulmonary disease identified. Electronically Signed   By: Abelardo Diesel M.D.   On: 04/16/2019 14:38    Pending Labs Unresulted Labs (From admission, onward)    Start     Ordered   04/17/19 0500  Creatinine, serum  Every Mon-Wed-Fri (0500),   R (with STAT occurrences)     04/16/19 1916   04/16/19 1359  Urine culture  ONCE - STAT,   STAT     04/16/19 1359   Signed and Held  CBC  Tomorrow morning,   R     Signed and Held   Signed and Held  Basic metabolic panel  Tomorrow morning,   R     Signed and Held          Vitals/Pain Today's Vitals   04/16/19 1900 04/16/19 1930 04/16/19 2000 04/16/19 2040  BP: 118/70 109/66 111/75   Pulse: 82 83 88   Resp: 18 (!) 24 (!) 26   Temp:    99.4 F (37.4 C)  TempSrc:    Oral  SpO2: 96% 96% 97%   Weight:      Height:      PainSc:        Isolation Precautions Airborne and Contact  precautions  Medications Medications  ceFEPIme (MAXIPIME) 2 g in sodium chloride 0.9 % 100 mL IVPB (has no administration in time range)  vancomycin (VANCOCIN) 1,250 mg in sodium chloride 0.9 % 250 mL IVPB (has no administration in time range)  potassium chloride SA (K-DUR) CR tablet 40 mEq (40 mEq Oral Given 04/16/19 2025)  sodium chloride 0.9 % bolus 1,000 mL (0 mLs Intravenous Stopped 04/16/19 1618)  acetaminophen (TYLENOL) tablet 1,000 mg (1,000 mg Oral Given 04/16/19 1435)  sodium chloride 0.9 % bolus 1,000 mL (0 mLs Intravenous Stopped 04/16/19 1618)  ceFEPIme (MAXIPIME) 2 g in sodium chloride 0.9 % 100 mL IVPB (0 g Intravenous Stopped 04/16/19 1900)  metroNIDAZOLE (FLAGYL) IVPB 500 mg (0 mg Intravenous Stopped 04/16/19  1740)  magnesium sulfate IVPB 2 g 50 mL (0 g Intravenous Stopped 04/16/19 1748)  potassium chloride 10 mEq in 100 mL IVPB (0 mEq Intravenous Stopped 04/16/19 1737)  vancomycin (VANCOCIN) IVPB 1000 mg/200 mL premix (1,000 mg Intravenous New Bag/Given 04/16/19 1656)    Followed by  vancomycin (VANCOCIN) IVPB 1000 mg/200 mL premix (0 mg Intravenous Stopped 04/16/19 1900)    Mobility walks Low fall risk   Focused Assessments    R Recommendations: See Admitting Provider Note  Report given to:   Additional Notes:

## 2019-04-16 NOTE — Assessment & Plan Note (Signed)

## 2019-04-17 ENCOUNTER — Encounter (HOSPITAL_COMMUNITY): Payer: Self-pay

## 2019-04-17 ENCOUNTER — Inpatient Hospital Stay (HOSPITAL_COMMUNITY): Payer: Medicare Other

## 2019-04-17 LAB — BLOOD CULTURE ID PANEL (REFLEXED)

## 2019-04-17 LAB — BASIC METABOLIC PANEL
Anion gap: 10 (ref 5–15)
BUN: 22 mg/dL (ref 8–23)
CO2: 22 mmol/L (ref 22–32)
Calcium: 7.9 mg/dL — ABNORMAL LOW (ref 8.9–10.3)
Chloride: 104 mmol/L (ref 98–111)
Creatinine, Ser: 1.29 mg/dL — ABNORMAL HIGH (ref 0.61–1.24)
GFR calc Af Amer: >60 mL/min (ref >60)
GFR calc non Af Amer: 59 mL/min — ABNORMAL LOW (ref >60)
Glucose, Bld: 147 mg/dL — ABNORMAL HIGH (ref 70–99)
Potassium: 3.4 mmol/L — ABNORMAL LOW (ref 3.5–5.1)
Sodium: 136 mmol/L (ref 135–145)

## 2019-04-17 LAB — GLUCOSE, CAPILLARY
Glucose-Capillary: 110 mg/dL — ABNORMAL HIGH (ref 70–99)
Glucose-Capillary: 122 mg/dL — ABNORMAL HIGH (ref 70–99)
Glucose-Capillary: 135 mg/dL — ABNORMAL HIGH (ref 70–99)
Glucose-Capillary: 142 mg/dL — ABNORMAL HIGH (ref 70–99)
Glucose-Capillary: 150 mg/dL — ABNORMAL HIGH (ref 70–99)

## 2019-04-17 LAB — CBC
HCT: 39.7 % (ref 39.0–52.0)
Hemoglobin: 13.4 g/dL (ref 13.0–17.0)
MCH: 30.4 pg (ref 26.0–34.0)
MCHC: 33.8 g/dL (ref 30.0–36.0)
MCV: 90 fL (ref 80.0–100.0)
Platelets: 150 10*3/uL (ref 150–400)
RBC: 4.41 MIL/uL (ref 4.22–5.81)
RDW: 13.2 % (ref 11.5–15.5)
WBC: 10.3 10*3/uL (ref 4.0–10.5)
nRBC: 0 % (ref 0.0–0.2)

## 2019-04-17 MED ORDER — SODIUM CHLORIDE 0.9 % IV SOLN
1.0000 g | Freq: Three times a day (TID) | INTRAVENOUS | Status: DC
  Administered 2019-04-17 – 2019-04-19 (×7): 1 g via INTRAVENOUS
  Filled 2019-04-17 (×7): qty 1

## 2019-04-17 NOTE — Progress Notes (Signed)
CRITICAL VALUE ALERT  Critical Value: 4th blood culture bottle positive for gram negative rods, KPC negative, ecoli ppositive  Date & Time Notied:  04/17/19 @ 4327   Provider Notified: Dr. Ander Slade notified via amion  Orders Received/Actions taken: awaiting orders

## 2019-04-17 NOTE — Progress Notes (Signed)
Patient informed RT that his home setting is CPAP of 11 but we have placed on CPAP of 8 with our machine. RT/nursing will continue to monitor patient for any changes.

## 2019-04-17 NOTE — Progress Notes (Signed)
PROGRESS NOTE                                                                                                                                                                                                             Patient Demographics:    Christopher Lambert, is a 63 y.o. male, DOB - 10/23/1955, KPT:[id number]  Admit date - 04/16/2019   Admitting Physician Truett Mainland, DO  Outpatient Primary MD for the patient is [name], MD  LOS - 1   Chief Complaint  Patient presents with  . Fever       Brief Narrative     63 y.o. male with a history of diabetes, hypertension, obstructive sleep apnea, obesity.  Presents with weakness, fever, body aches work-up was significant for sepsis related to UTI, as well bacteremia .   Subjective:    Christopher Lambert today reports lower back pain has resolved, still reports some fever and chills, generalized weakness, no nausea or vomiting .    Assessment  & Plan :    Principal Problem:   Sepsis ([place]) Active Problems:   Essential hypertension, benign   Obstructive sleep apnea   Diabetes mellitus type 2 in obese (HCC)   Acute lower UTI   Hypokalemia    Sepsis related to UTI/bacteremia -Sepsis present on admission is febrile,  with leukocytosis and elevated lactic acid. -Urine cultures and blood cultures, so far growing gram-negative rods, broaden coverage to meropenem to cover for ESBL empirically pending final sensitivity and specificity. -We will obtain CT renal bilaterally rule out any kidney stones causing his sepsis given he had bilateral lower back pain for last couple days.  Hypokalemia -Repleted, recheck in a.m.  Diabetes mellitus -We will hold metformin, continue with insulin sliding scale currently, appears to be controlled  Obstructive of sleep apnea -On CPAP nightly  Hypertension -Hold antihypertensive medication currently given soft blood pressure       COVID-19 Labs  No  results for input(s): DDIMER, FERRITIN, LDH, CRP in the last 72 hours.  Lab Results  Component Value Date   [place] NEGATIVE 04/16/2019     Code Status : Full  Family Communication  : D/W patient  Disposition Plan  : Home  Barriers For Discharge : With bacteremia, remains on IV antibiotics  Consults  :  None  Procedures  :  None  DVT Prophylaxis  :  lovenox  Lab Results  Component Value Date   PLT 150 04/17/2019    Antibiotics  :    Anti-infectives (From admission, onward)   Start     Dose/Rate Route Frequency Ordered Stop   04/17/19 1030  meropenem (MERREM) 1 g in sodium chloride 0.9 % 100 mL IVPB     1 g 200 mL/hr over 30 Minutes Intravenous Every 8 hours 04/17/19 1015     04/17/19 0500  vancomycin (VANCOCIN) 1,250 mg in sodium chloride 0.9 % 250 mL IVPB  Status:  Discontinued     1,250 mg 166.7 mL/hr over 90 Minutes Intravenous Every 12 hours 04/16/19 1911 04/16/19 2116   04/17/19 0200  ceFEPIme (MAXIPIME) 2 g in sodium chloride 0.9 % 100 mL IVPB  Status:  Discontinued     2 g 200 mL/hr over 30 Minutes Intravenous Every 8 hours 04/16/19 1909 04/16/19 2116   04/16/19 2200  cefTRIAXone (ROCEPHIN) 1 g in sodium chloride 0.9 % 100 mL IVPB  Status:  Discontinued     1 g 200 mL/hr over 30 Minutes Intravenous Every 24 hours 04/16/19 2116 04/17/19 1015   04/16/19 1830  vancomycin (VANCOCIN) IVPB 1000 mg/200 mL premix     1,000 mg 200 mL/hr over 60 Minutes Intravenous  Once 04/16/19 1651 04/16/19 1900   04/16/19 1700  vancomycin (VANCOCIN) IVPB 1000 mg/200 mL premix     1,000 mg 200 mL/hr over 60 Minutes Intravenous  Once 04/16/19 1651 04/16/19 1756   04/16/19 1600  ceFEPIme (MAXIPIME) 2 g in sodium chloride 0.9 % 100 mL IVPB     2 g 200 mL/hr over 30 Minutes Intravenous  Once 04/16/19 1547 04/16/19 1900   04/16/19 1600  metroNIDAZOLE (FLAGYL) IVPB 500 mg     500 mg 100 mL/hr over 60 Minutes Intravenous  Once 04/16/19 1547 04/16/19 1740   04/16/19 1600  vancomycin  (VANCOCIN) IVPB 1000 mg/200 mL premix  Status:  Discontinued     1,000 mg 200 mL/hr over 60 Minutes Intravenous  Once 04/16/19 1547 04/16/19 1651        Objective:   Vitals:   04/16/19 2250 04/17/19 0134 04/17/19 0605 04/17/19 0852  BP:  108/66 106/63   Pulse: 80 95 92   Resp: 18 18 18    Temp:  (!) 101.6 F (38.7 C) 97.8 F (36.6 C)   TempSrc:  Axillary Axillary   SpO2:  97% 96% 98%  Weight:      Height:        Wt Readings from Last 3 Encounters:  04/16/19 127 kg  04/11/19 129.3 kg  11/24/18 124.4 kg     Intake/Output Summary (Last 24 hours) at 04/17/2019 1222 Last data filed at 04/17/2019 0900 Gross per 24 hour  Intake 1418.33 ml  Output -  Net 1418.33 ml     Physical Exam  Awake Alert, Oriented X 3, No new F.N deficits, Normal affect Symmetrical Chest wall movement, Good air movement bilaterally, CTAB RRR,No Gallops,Rubs or new Murmurs, No Parasternal Heave +ve B.Sounds, Abd Soft, No tenderness, No organomegaly appriciated, No rebound - guarding or rigidity. No Cyanosis, Clubbing or edema, No new Rash or bruise      Data Review:    CBC Recent Labs  Lab 04/11/19 1534 04/16/19 1414 04/17/19 0627  WBC 5.1 11.4* 10.3  HGB 15.0 14.5 13.4  HCT 42.8 42.3 39.7  PLT 222 153 150  MCV 90.3 89.4 90.0  MCH 31.6 30.7 30.4  MCHC 35.0 34.3 33.8  RDW 13.1 13.1 13.2  LYMPHSABS  --  0.4*  --   MONOABS  --  0.2  --   EOSABS  --  0.0  --   BASOSABS  --  0.0  --     Chemistries  Recent Labs  Lab 04/11/19 1534 04/16/19 1414 04/16/19 1419 04/17/19 0627  NA 143 134*  --  136  K 3.8 2.7*  --  3.4*  CL 104 98  --  104  CO2 29 22  --  22  GLUCOSE 87 162*  --  147*  BUN 15 19  --  22  CREATININE 1.09 1.38*  --  1.29*  CALCIUM 9.6 8.3*  --  7.9*  MG  --   --  1.7  --   AST 19 25  --   --   ALT 22 30  --   --   ALKPHOS  --  83  --   --   BILITOT 0.5 2.1*  --   --     ------------------------------------------------------------------------------------------------------------------ No results for input(s): CHOL, HDL, LDLCALC, TRIG, CHOLHDL, LDLDIRECT in the last 72 hours.  Lab Results  Component Value Date   HGBA1C 6.4 (H) 04/11/2019   ------------------------------------------------------------------------------------------------------------------ No results for input(s): TSH, T4TOTAL, T3FREE, THYROIDAB in the last 72 hours.  Invalid input(s): FREET3 ------------------------------------------------------------------------------------------------------------------ No results for input(s): VITAMINB12, FOLATE, FERRITIN, TIBC, IRON, RETICCTPCT in the last 72 hours.  Coagulation profile No results for input(s): INR, PROTIME in the last 168 hours.  No results for input(s): DDIMER in the last 72 hours.  Cardiac Enzymes No results for input(s): CKMB, TROPONINI, MYOGLOBIN in the last 168 hours.  Invalid input(s): CK ------------------------------------------------------------------------------------------------------------------ No results found for: BNP  Inpatient Medications  Scheduled Meds: . allopurinol  300 mg Oral Daily  . aspirin EC  81 mg Oral Daily  . enoxaparin (LOVENOX) injection  60 mg Subcutaneous Q24H  . insulin aspart  0-15 Units Subcutaneous TID WC  . insulin aspart  0-5 Units Subcutaneous QHS  . potassium chloride  40 mEq Oral BID  . simvastatin  5 mg Oral q1800   Continuous Infusions: . sodium chloride 100 mL/hr at 04/17/19 0804  . meropenem (MERREM) IV 1 g (04/17/19 1056)   PRN Meds:.acetaminophen, ondansetron **OR** ondansetron (ZOFRAN) IV  Micro Results Recent Results (from the past 240 hour(s))  SARS Coronavirus 2 [facility] order, Performed in [facility] hospital lab) Nasopharyngeal Nasopharyngeal Swab     Status: None   Collection Time: 04/16/19  2:00 PM   Specimen: Nasopharyngeal Swab  Result Value Ref Range  Status   SARS Coronavirus 2 NEGATIVE NEGATIVE Final    Comment: (NOTE) If result is NEGATIVE SARS-CoV-2 target nucleic acids are NOT DETECTED. The SARS-CoV-2 RNA is generally detectable in upper and lower  respiratory specimens during the acute phase of infection. The lowest  concentration of SARS-CoV-2 viral copies this assay can detect is 250  copies / mL. A negative result does not preclude SARS-CoV-2 infection  and should not be used as the sole basis for treatment or other  patient management decisions.  A negative result may occur with  improper specimen collection / handling, submission of specimen other  than nasopharyngeal swab, presence of viral mutation(s) within the  areas targeted by this assay, and inadequate number of viral copies  (<250 copies / mL). A negative result must be combined with clinical  observations, patient history, and epidemiological information. If result is POSITIVE SARS-CoV-2 target nucleic  acids are DETECTED. The SARS-CoV-2 RNA is generally detectable in upper and lower  respiratory specimens dur ing the acute phase of infection.  Positive  results are indicative of active infection with SARS-CoV-2.  Clinical  correlation with patient history and other diagnostic information is  necessary to determine patient infection status.  Positive results do  not rule out bacterial infection or co-infection with other viruses. If result is PRESUMPTIVE POSTIVE SARS-CoV-2 nucleic acids MAY BE PRESENT.   A presumptive positive result was obtained on the submitted specimen  and confirmed on repeat testing.  While 2019 novel coronavirus  (SARS-CoV-2) nucleic acids may be present in the submitted sample  additional confirmatory testing may be necessary for epidemiological  and / or clinical management purposes  to differentiate between  SARS-CoV-2 and other Sarbecovirus currently known to infect humans.  If clinically indicated additional testing with an alternate  test  methodology [phone]) is advised. The SARS-CoV-2 RNA is generally  detectable in upper and lower respiratory sp ecimens during the acute  phase of infection. The expected result is Negative. Fact Sheet for Patients:  StrictlyIdeas.no Fact Sheet for Healthcare Providers: BankingDealers.co.za This test is not yet approved or cleared by the [place] FDA and has been authorized for detection and/or diagnosis of SARS-CoV-2 by FDA under an Emergency Use Authorization (EUA).  This EUA will remain in effect (meaning this test can be used) for the duration of the COVID-19 declaration under Section 564(b)(1) of the Act, 21 U.S.C. section 360bbb-3(b)(1), unless the authorization is terminated or revoked sooner. Performed at [facility], [address] [place], [place] [address]   Culture, blood (routine x 2)     Status: None (Preliminary result)   Collection Time: 04/16/19  2:19 PM   Specimen: Left Antecubital; Blood  Result Value Ref Range Status   Specimen Description   Final    LEFT ANTECUBITAL BOTTLES DRAWN AEROBIC AND ANAEROBIC Performed at [facility] [address], [place], [place] [address]    Special Requests   Final    Blood Culture adequate volume Performed at [facility], [address], [place], [place] [address]    Culture  Setup Time   Final    GRAM NEGATIVE RODS Gram Stain Report Called to,Read Back By and Verified With: BONDURANCT 0254 ON 04/17/2019 BY EVA IN BOTH AEROBIC AND ANAEROBIC BOTTLES Performed at [facility], READ BACK BY AND VERIFIED WITH: RN J RHEW [id number] AT 284 AM  BY CM Performed at [facility], [place] [place],  [address]    Culture GRAM NEGATIVE RODS  Final   Report Status PENDING  Incomplete  Blood Culture ID Panel (Reflexed)     Status: Abnormal   Collection Time: 04/16/19  2:19 PM  Result Value Ref Range Status   Enterococcus species NOT  DETECTED NOT DETECTED Final   Listeria monocytogenes NOT DETECTED NOT DETECTED Final   Staphylococcus species NOT DETECTED NOT DETECTED Final   Staphylococcus aureus (BCID) NOT DETECTED NOT DETECTED Final   Streptococcus species NOT DETECTED NOT DETECTED Final   Streptococcus agalactiae NOT DETECTED NOT DETECTED Final   Streptococcus pneumoniae NOT DETECTED NOT DETECTED Final   Streptococcus pyogenes NOT DETECTED NOT DETECTED Final   Acinetobacter baumannii NOT DETECTED NOT DETECTED Final   Enterobacteriaceae species DETECTED (A) NOT DETECTED Final    Comment: Enterobacteriaceae represent a large family of gram-negative bacteria, not a single organism. CRITICAL RESULT CALLED TO, READ BACK BY AND  VERIFIED WITH: RN J RHEW [id number] AT 648 AM BY CM    Enterobacter cloacae complex NOT DETECTED NOT DETECTED Final   Escherichia coli DETECTED (A) NOT DETECTED Final    Comment: CRITICAL RESULT CALLED TO, READ BACK BY AND VERIFIED WITH: RN J RHEW [id number] AT 56 AM BY CM    Klebsiella oxytoca NOT DETECTED NOT DETECTED Final   Klebsiella pneumoniae NOT DETECTED NOT DETECTED Final   Proteus species NOT DETECTED NOT DETECTED Final   Serratia marcescens NOT DETECTED NOT DETECTED Final   Carbapenem resistance NOT DETECTED NOT DETECTED Final   Haemophilus influenzae NOT DETECTED NOT DETECTED Final   Neisseria meningitidis NOT DETECTED NOT DETECTED Final   Pseudomonas aeruginosa NOT DETECTED NOT DETECTED Final   Candida albicans NOT DETECTED NOT DETECTED Final   Candida glabrata NOT DETECTED NOT DETECTED Final   Candida krusei NOT DETECTED NOT DETECTED Final   Candida parapsilosis NOT DETECTED NOT DETECTED Final   Candida tropicalis NOT DETECTED NOT DETECTED Final    Comment: Performed at [facility], [address] [place], [place] [address]  Culture, blood (routine x 2)     Status: None (Preliminary result)   Collection Time: 04/16/19  2:29 PM   Specimen: Right Antecubital; Blood  Result  Value Ref Range Status   Specimen Description   Final    RIGHT ANTECUBITAL BOTTLES DRAWN AEROBIC AND ANAEROBIC Performed at [facility], [address], [place], [place] [address]    Special Requests   Final    Blood Culture adequate volume Performed at [facility], [address] [place], [place] [address]    Culture  Setup Time   Final    GRAM NEGATIVE RODS Gram Stain Report Called to,Read Back By and Verified With: BONDURANT,R. AT 0254 ON 04/17/2019 BY EVA IN BOTH AEROBIC AND ANAEROBIC BOTTLES Performed at [place], READ BACK BY AND VERIFIED WITH: RN J RHEW [id number] AT 648 AM BY CM Performed at [facility], [place] [address], [place], [place] [address]    Culture GRAM NEGATIVE RODS  Final   Report Status PENDING  Incomplete    Radiology Reports Dg Chest Portable 1 View  Result Date: 04/16/2019 CLINICAL DATA:  Fever, generalized weakness and body aches EXAM: PORTABLE CHEST 1 VIEW COMPARISON:  May 18, 2012 FINDINGS: The heart size and mediastinal contours are within normal limits. Both lungs are clear. The visualized skeletal structures are unremarkable. IMPRESSION: No acute cardiopulmonary disease identified. Electronically Signed   By: Abelardo Diesel M.D.   On: 04/16/2019 14:38     Phillips Climes M.D on 04/17/2019 at 12:22 PM  Between 7am to 7pm - Pager - [phone]  After 7pm go to [email or web address] - password [email or web address]  [facility] -  Office  [phone]

## 2019-04-17 NOTE — Progress Notes (Signed)
CRITICAL VALUE ALERT  Critical Value:  Blood Cultures have resulted 3 bottles are positive for gram negative rods. Awaiting 4th bottle results  Date & Time Notied:  04/17/2019 @0257   Provider Notified: Dr. Ander Slade sent message through [facility]  Orders Received/Actions taken: awaiting orders at this time

## 2019-04-17 NOTE — ACP (Advance Care Planning) (Signed)
Shared and reviewed Advance Directives information with Mr Allred. Plan to follow up with him about completing.

## 2019-04-17 NOTE — Progress Notes (Signed)
PHARMACY - PHYSICIAN COMMUNICATION CRITICAL VALUE ALERT - BLOOD CULTURE IDENTIFICATION (BCID)  Christopher Lambert is an 63 y.o. male who presented to [facility] on 04/16/2019   Assessment:  E.coli bacteremia   Name of physician (or Provider) Contacted: Dr. [facility]  Current antibiotics:Ceftriaxone 1000 mg IV every 24 hours.  Changes to prescribed antibiotics recommended:  Recommendations accepted by provider- Will escalate to Merrem 1000 mg IV every 8 hours until sensitivites available due to increased risk of ESBL at [facility].  Results for orders placed or performed during the hospital encounter of 04/16/19  Blood Culture ID Panel (Reflexed) (Collected: 04/16/2019  2:19 PM)  Result Value Ref Range   Enterococcus species NOT DETECTED NOT DETECTED   Listeria monocytogenes NOT DETECTED NOT DETECTED   Staphylococcus species NOT DETECTED NOT DETECTED   Staphylococcus aureus (BCID) NOT DETECTED NOT DETECTED   Streptococcus species NOT DETECTED NOT DETECTED   Streptococcus agalactiae NOT DETECTED NOT DETECTED   Streptococcus pneumoniae NOT DETECTED NOT DETECTED   Streptococcus pyogenes NOT DETECTED NOT DETECTED   Acinetobacter baumannii NOT DETECTED NOT DETECTED   Enterobacteriaceae species DETECTED (A) NOT DETECTED   Enterobacter cloacae complex NOT DETECTED NOT DETECTED   Escherichia coli DETECTED (A) NOT DETECTED   Klebsiella oxytoca NOT DETECTED NOT DETECTED   Klebsiella pneumoniae NOT DETECTED NOT DETECTED   Proteus species NOT DETECTED NOT DETECTED   Serratia marcescens NOT DETECTED NOT DETECTED   Carbapenem resistance NOT DETECTED NOT DETECTED   Haemophilus influenzae NOT DETECTED NOT DETECTED   Neisseria meningitidis NOT DETECTED NOT DETECTED   Pseudomonas aeruginosa NOT DETECTED NOT DETECTED   Candida albicans NOT DETECTED NOT DETECTED   Candida glabrata NOT DETECTED NOT DETECTED   Candida krusei NOT DETECTED NOT DETECTED   Candida parapsilosis NOT DETECTED NOT DETECTED   Candida tropicalis NOT DETECTED NOT DETECTED    Christopher Lambert 04/17/2019  11:15 AM

## 2019-04-18 DIAGNOSIS — A4151 Sepsis due to Escherichia coli [E. coli]: Principal | ICD-10-CM

## 2019-04-18 LAB — CBC
HCT: 41.1 % (ref 39.0–52.0)
Hemoglobin: 13.9 g/dL (ref 13.0–17.0)
MCH: 30.7 pg (ref 26.0–34.0)
MCHC: 33.8 g/dL (ref 30.0–36.0)
MCV: 90.7 fL (ref 80.0–100.0)
Platelets: 150 10*3/uL (ref 150–400)
RBC: 4.53 MIL/uL (ref 4.22–5.81)
RDW: 13.6 % (ref 11.5–15.5)
WBC: 6.4 10*3/uL (ref 4.0–10.5)
nRBC: 0 % (ref 0.0–0.2)

## 2019-04-18 LAB — BASIC METABOLIC PANEL
Anion gap: 10 (ref 5–15)
BUN: 17 mg/dL (ref 8–23)
CO2: 22 mmol/L (ref 22–32)
Calcium: 7.7 mg/dL — ABNORMAL LOW (ref 8.9–10.3)
Chloride: 104 mmol/L (ref 98–111)
Creatinine, Ser: 1.07 mg/dL (ref 0.61–1.24)
GFR calc Af Amer: >60 mL/min (ref >60)
GFR calc non Af Amer: >60 mL/min (ref >60)
Glucose, Bld: 122 mg/dL — ABNORMAL HIGH (ref 70–99)
Potassium: 3.6 mmol/L (ref 3.5–5.1)
Sodium: 136 mmol/L (ref 135–145)

## 2019-04-18 LAB — GLUCOSE, CAPILLARY
Glucose-Capillary: 126 mg/dL — ABNORMAL HIGH (ref 70–99)
Glucose-Capillary: 134 mg/dL — ABNORMAL HIGH (ref 70–99)
Glucose-Capillary: 125 mg/dL — ABNORMAL HIGH (ref 70–99)
Glucose-Capillary: 112 mg/dL — ABNORMAL HIGH (ref 70–99)

## 2019-04-18 NOTE — Progress Notes (Signed)
PROGRESS NOTE                                                                                                                                                                                                             Patient Demographics:    Christopher Lambert, is a 63 y.o. male, DOB - [birth year], INO:[id number]  Admit date - 04/16/2019   Admitting Physician Truett Mainland, DO  Outpatient Primary MD for the patient is [name], MD  LOS - 2   Chief Complaint  Patient presents with  . Fever       Brief Narrative     63 y.o. male with a history of diabetes, hypertension, obstructive sleep apnea, obesity.  Presents with weakness, fever, body aches work-up was significant for sepsis related to UTI, as well bacteremia .   Subjective:    Christopher Lambert today denies any flank pain, lower back pain, reports some fever and chills, T-max 102.6 over the last 24 hours, but no nausea or vomiting .   Assessment  & Plan :    Principal Problem:   Sepsis ([place]) Active Problems:   Essential hypertension, benign   Obstructive sleep apnea   Diabetes mellitus type 2 in obese (HCC)   Acute lower UTI   Hypokalemia    Sepsis related to bilateral pyelonephritis/bacteremia -Sepsis present on admission is febrile,  with leukocytosis and elevated lactic acid. - Urine cultures and blood cultures, growing E. coli, continue with meropenem coverage, pending sensitivity . -We will repeat blood cultures in a.m.  -CT renal protocol was obtained, no evidence of obstructing stones, but significant for bilateral pyelonephritis .  E. coli bacteremia -Continue with IV meropenem, repeat blood cultures in a.m., follow on sensitivity  Hypokalemia -Repleted,   Diabetes mellitus -We will hold metformin. -  continue with insulin sliding scale currently, appears to be controlled  Obstructive of sleep apnea -On CPAP nightly  Hypertension -Hold antihypertensive  medication currently given soft blood pressure       COVID-19 Labs  No results for input(s): DDIMER, FERRITIN, LDH, CRP in the last 72 hours.  Lab Results  Component Value Date   [facility] NEGATIVE 04/16/2019     Code Status : Full  Family Communication  : D/W patient  Disposition Plan  : Home  Barriers For Discharge : With bacteremia,  remains on IV antibiotics  Consults  :  None  Procedures  : None  DVT Prophylaxis  :  lovenox  Lab Results  Component Value Date   PLT 150 04/18/2019    Antibiotics  :    Anti-infectives (From admission, onward)   Start     Dose/Rate Route Frequency Ordered Stop   04/17/19 1030  meropenem (MERREM) 1 g in sodium chloride 0.9 % 100 mL IVPB     1 g 200 mL/hr over 30 Minutes Intravenous Every 8 hours 04/17/19 1015     04/17/19 0500  vancomycin (VANCOCIN) 1,250 mg in sodium chloride 0.9 % 250 mL IVPB  Status:  Discontinued     1,250 mg 166.7 mL/hr over 90 Minutes Intravenous Every 12 hours 04/16/19 1911 04/16/19 2116   04/17/19 0200  ceFEPIme (MAXIPIME) 2 g in sodium chloride 0.9 % 100 mL IVPB  Status:  Discontinued     2 g 200 mL/hr over 30 Minutes Intravenous Every 8 hours 04/16/19 1909 04/16/19 2116   04/16/19 2200  cefTRIAXone (ROCEPHIN) 1 g in sodium chloride 0.9 % 100 mL IVPB  Status:  Discontinued     1 g 200 mL/hr over 30 Minutes Intravenous Every 24 hours 04/16/19 2116 04/17/19 1015   04/16/19 1830  vancomycin (VANCOCIN) IVPB 1000 mg/200 mL premix     1,000 mg 200 mL/hr over 60 Minutes Intravenous  Once 04/16/19 1651 04/16/19 1900   04/16/19 1700  vancomycin (VANCOCIN) IVPB 1000 mg/200 mL premix     1,000 mg 200 mL/hr over 60 Minutes Intravenous  Once 04/16/19 1651 04/16/19 1756   04/16/19 1600  ceFEPIme (MAXIPIME) 2 g in sodium chloride 0.9 % 100 mL IVPB     2 g 200 mL/hr over 30 Minutes Intravenous  Once 04/16/19 1547 04/16/19 1900   04/16/19 1600  metroNIDAZOLE (FLAGYL) IVPB 500 mg     500 mg 100 mL/hr over 60  Minutes Intravenous  Once 04/16/19 1547 04/16/19 1740   04/16/19 1600  vancomycin (VANCOCIN) IVPB 1000 mg/200 mL premix  Status:  Discontinued     1,000 mg 200 mL/hr over 60 Minutes Intravenous  Once 04/16/19 1547 04/16/19 1651        Objective:   Vitals:   04/17/19 1800 04/17/19 2126 04/17/19 2322 04/18/19 0550  BP:  124/67  130/76  Pulse:  90  77  Resp:   18 18  Temp: 98.7 F (37.1 C) (!) 100.4 F (38 C)  (!) 100.6 F (38.1 C)  TempSrc: Oral Oral  Oral  SpO2:  98% 97% 100%  Weight:      Height:        Wt Readings from Last 3 Encounters:  04/16/19 127 kg  04/11/19 129.3 kg  11/24/18 124.4 kg     Intake/Output Summary (Last 24 hours) at 04/18/2019 1058 Last data filed at 04/18/2019 0500 Gross per 24 hour  Intake 3254.63 ml  Output 450 ml  Net 2804.63 ml     Physical Exam  Awake Alert, Oriented X 3, No new F.N deficits, Normal affect Symmetrical Chest wall movement, Good air movement bilaterally, CTAB RRR,No Gallops,Rubs or new Murmurs, No Parasternal Heave +ve B.Sounds, Abd Soft, No tenderness, No rebound - guarding or rigidity. No Cyanosis, Clubbing or edema, No new Rash or bruise       Data Review:    CBC Recent Labs  Lab 04/11/19 1534 04/16/19 1414 04/17/19 0627 04/18/19 0623  WBC 5.1 11.4* 10.3 6.4  HGB 15.0 14.5 13.4  13.9  HCT 42.8 42.3 39.7 41.1  PLT 222 153 150 150  MCV 90.3 89.4 90.0 90.7  MCH 31.6 30.7 30.4 30.7  MCHC 35.0 34.3 33.8 33.8  RDW 13.1 13.1 13.2 13.6  LYMPHSABS  --  0.4*  --   --   MONOABS  --  0.2  --   --   EOSABS  --  0.0  --   --   BASOSABS  --  0.0  --   --     Chemistries  Recent Labs  Lab 04/11/19 1534 04/16/19 1414 04/16/19 1419 04/17/19 0627 04/18/19 0623  NA 143 134*  --  136 136  K 3.8 2.7*  --  3.4* 3.6  CL 104 98  --  104 104  CO2 29 22  --  22 22  GLUCOSE 87 162*  --  147* 122*  BUN 15 19  --  22 17  CREATININE 1.09 1.38*  --  1.29* 1.07  CALCIUM 9.6 8.3*  --  7.9* 7.7*  MG  --   --  1.7  --   --    AST 19 25  --   --   --   ALT 22 30  --   --   --   ALKPHOS  --  83  --   --   --   BILITOT 0.5 2.1*  --   --   --    ------------------------------------------------------------------------------------------------------------------ No results for input(s): CHOL, HDL, LDLCALC, TRIG, CHOLHDL, LDLDIRECT in the last 72 hours.  Lab Results  Component Value Date   HGBA1C 6.4 (H) 04/11/2019   ------------------------------------------------------------------------------------------------------------------ No results for input(s): TSH, T4TOTAL, T3FREE, THYROIDAB in the last 72 hours.  Invalid input(s): FREET3 ------------------------------------------------------------------------------------------------------------------ No results for input(s): VITAMINB12, FOLATE, FERRITIN, TIBC, IRON, RETICCTPCT in the last 72 hours.  Coagulation profile No results for input(s): INR, PROTIME in the last 168 hours.  No results for input(s): DDIMER in the last 72 hours.  Cardiac Enzymes No results for input(s): CKMB, TROPONINI, MYOGLOBIN in the last 168 hours.  Invalid input(s): CK ------------------------------------------------------------------------------------------------------------------ No results found for: BNP  Inpatient Medications  Scheduled Meds: . allopurinol  300 mg Oral Daily  . aspirin EC  81 mg Oral Daily  . enoxaparin (LOVENOX) injection  60 mg Subcutaneous Q24H  . insulin aspart  0-15 Units Subcutaneous TID WC  . insulin aspart  0-5 Units Subcutaneous QHS  . simvastatin  5 mg Oral q1800   Continuous Infusions: . sodium chloride 100 mL/hr at 04/18/19 0435  . meropenem (MERREM) IV 1 g (04/18/19 0946)   PRN Meds:.acetaminophen, ondansetron **OR** ondansetron (ZOFRAN) IV  Micro Results Recent Results (from the past 240 hour(s))  SARS Coronavirus 2 [facility] order, Performed in [facility] hospital lab) Nasopharyngeal Nasopharyngeal Swab     Status: None   Collection Time:  04/16/19  2:00 PM   Specimen: Nasopharyngeal Swab  Result Value Ref Range Status   SARS Coronavirus 2 NEGATIVE NEGATIVE Final    Comment: (NOTE) If result is NEGATIVE SARS-CoV-2 target nucleic acids are NOT DETECTED. The SARS-CoV-2 RNA is generally detectable in upper and lower  respiratory specimens during the acute phase of infection. The lowest  concentration of SARS-CoV-2 viral copies this assay can detect is 250  copies / mL. A negative result does not preclude SARS-CoV-2 infection  and should not be used as the sole basis for treatment or other  patient management decisions.  A negative result may occur with  improper specimen  collection / handling, submission of specimen other  than nasopharyngeal swab, presence of viral mutation(s) within the  areas targeted by this assay, and inadequate number of viral copies  (<250 copies / mL). A negative result must be combined with clinical  observations, patient history, and epidemiological information. If result is POSITIVE SARS-CoV-2 target nucleic acids are DETECTED. The SARS-CoV-2 RNA is generally detectable in upper and lower  respiratory specimens dur ing the acute phase of infection.  Positive  results are indicative of active infection with SARS-CoV-2.  Clinical  correlation with patient history and other diagnostic information is  necessary to determine patient infection status.  Positive results do  not rule out bacterial infection or co-infection with other viruses. If result is PRESUMPTIVE POSTIVE SARS-CoV-2 nucleic acids MAY BE PRESENT.   A presumptive positive result was obtained on the submitted specimen  and confirmed on repeat testing.  While 2019 novel coronavirus  (SARS-CoV-2) nucleic acids may be present in the submitted sample  additional confirmatory testing may be necessary for epidemiological  and / or clinical management purposes  to differentiate between  SARS-CoV-2 and other Sarbecovirus currently known to  infect humans.  If clinically indicated additional testing with an alternate test  methodology [phone]) is advised. The SARS-CoV-2 RNA is generally  detectable in upper and lower respiratory sp ecimens during the acute  phase of infection. The expected result is Negative. Fact Sheet for Patients:  StrictlyIdeas.no Fact Sheet for Healthcare Providers: BankingDealers.co.za This test is not yet approved or cleared by the [place] FDA and has been authorized for detection and/or diagnosis of SARS-CoV-2 by FDA under an Emergency Use Authorization (EUA).  This EUA will remain in effect (meaning this test can be used) for the duration of the COVID-19 declaration under Section 564(b)(1) of the Act, 21 U.S.C. section 360bbb-3(b)(1), unless the authorization is terminated or revoked sooner. Performed at [facility], [address] [place], [place] [address]   Culture, blood (routine x 2)     Status: Abnormal (Preliminary result)   Collection Time: 04/16/19  2:19 PM   Specimen: Left Antecubital; Blood  Result Value Ref Range Status   Specimen Description   Final    LEFT ANTECUBITAL BOTTLES DRAWN AEROBIC AND ANAEROBIC Performed at [facility], [address] [place], [place] [address]    Special Requests   Final    Blood Culture adequate volume Performed at [place] [place],  [address]    Culture  Setup Time   Final    GRAM NEGATIVE RODS Gram Stain Report Called to,Read Back By and Verified With: BONDURANCT 0254 ON 04/17/2019 BY EVA IN BOTH AEROBIC AND ANAEROBIC BOTTLES Performed at [place], READ BACK BY AND VERIFIED WITH: RN J RHEW [id number] AT 66 AM  BY CM    Culture (A)  Final    ESCHERICHIA COLI SUSCEPTIBILITIES TO FOLLOW Performed at [facility], [place] [address], [place],  [address]    Report Status PENDING  Incomplete  Blood Culture ID Panel (Reflexed)      Status: Abnormal   Collection Time: 04/16/19  2:19 PM  Result Value Ref Range Status   Enterococcus species NOT DETECTED NOT DETECTED Final   Listeria monocytogenes NOT DETECTED NOT DETECTED Final   Staphylococcus species NOT DETECTED NOT DETECTED Final   Staphylococcus aureus (BCID) NOT DETECTED NOT DETECTED Final   Streptococcus species NOT DETECTED NOT DETECTED Final   Streptococcus agalactiae NOT DETECTED  NOT DETECTED Final   Streptococcus pneumoniae NOT DETECTED NOT DETECTED Final   Streptococcus pyogenes NOT DETECTED NOT DETECTED Final   Acinetobacter baumannii NOT DETECTED NOT DETECTED Final   Enterobacteriaceae species DETECTED (A) NOT DETECTED Final    Comment: Enterobacteriaceae represent a large family of gram-negative bacteria, not a single organism. CRITICAL RESULT CALLED TO, READ BACK BY AND VERIFIED WITH: RN J RHEW [id number] AT 19 AM BY CM    Enterobacter cloacae complex NOT DETECTED NOT DETECTED Final   Escherichia coli DETECTED (A) NOT DETECTED Final    Comment: CRITICAL RESULT CALLED TO, READ BACK BY AND VERIFIED WITH: RN J RHEW [id number] AT 17 AM BY CM    Klebsiella oxytoca NOT DETECTED NOT DETECTED Final   Klebsiella pneumoniae NOT DETECTED NOT DETECTED Final   Proteus species NOT DETECTED NOT DETECTED Final   Serratia marcescens NOT DETECTED NOT DETECTED Final   Carbapenem resistance NOT DETECTED NOT DETECTED Final   Haemophilus influenzae NOT DETECTED NOT DETECTED Final   Neisseria meningitidis NOT DETECTED NOT DETECTED Final   Pseudomonas aeruginosa NOT DETECTED NOT DETECTED Final   Candida albicans NOT DETECTED NOT DETECTED Final   Candida glabrata NOT DETECTED NOT DETECTED Final   Candida krusei NOT DETECTED NOT DETECTED Final   Candida parapsilosis NOT DETECTED NOT DETECTED Final   Candida tropicalis NOT DETECTED NOT DETECTED Final    Comment: Performed at [facility], [place], [place], [place] [address]  Culture, blood (routine x 2)      Status: Abnormal (Preliminary result)   Collection Time: 04/16/19  2:29 PM   Specimen: Right Antecubital; Blood  Result Value Ref Range Status   Specimen Description   Final    RIGHT ANTECUBITAL BOTTLES DRAWN AEROBIC AND ANAEROBIC Performed at [facility], [address], [place], [place] [address]    Special Requests   Final    Blood Culture adequate volume Performed at [facility], [address], [place], [place] [address]    Culture  Setup Time   Final    GRAM NEGATIVE RODS Gram Stain Report Called to,Read Back By and Verified With: BONDURANT,R. AT 0254 ON 04/17/2019 BY EVA IN BOTH AEROBIC AND ANAEROBIC BOTTLES Performed at [place], READ BACK BY AND VERIFIED WITH: RN J RHEW [id number] AT 69 AM BY CM    Culture (A)  Final    ESCHERICHIA COLI SUSCEPTIBILITIES TO FOLLOW Performed at [facility], [place] [address] [place], [place] [address]    Report Status PENDING  Incomplete  Urine culture     Status: Abnormal (Preliminary result)   Collection Time: 04/16/19  4:42 PM   Specimen: Urine, Clean Catch  Result Value Ref Range Status   Specimen Description   Final    URINE, CLEAN CATCH Performed at [facility], [address] [place], [place] [address]    Special Requests   Final    NONE Performed at [facility], [address] [place], [place] [address]    Culture (A)  Final    >=100,000 COLONIES/mL ESCHERICHIA COLI SUSCEPTIBILITIES TO FOLLOW Performed at [place] [address], [place], [place] [address]    Report Status PENDING  Incomplete    Radiology Reports Dg Chest Portable 1 View  Result Date: 04/16/2019 CLINICAL DATA:  Fever, generalized weakness and body aches EXAM: PORTABLE CHEST 1 VIEW COMPARISON:  May 18, 2012 FINDINGS: The heart size and mediastinal contours are within normal limits.  Both lungs are clear. The visualized skeletal structures are unremarkable. IMPRESSION: No acute cardiopulmonary disease  identified. Electronically Signed   By: Abelardo Diesel M.D.   On: 04/16/2019 14:38   Ct Renal Stone Study  Result Date: 04/17/2019 CLINICAL DATA:  Weakness fever body aches EXAM: CT ABDOMEN AND PELVIS WITHOUT CONTRAST TECHNIQUE: Multidetector CT imaging of the abdomen and pelvis was performed following the standard protocol without IV contrast. COMPARISON:  August 12, 2015 FINDINGS: The study is limited due to patient motion. Lower chest: The visualized heart size within normal limits. No pericardial fluid/thickening. No hiatal hernia. The visualized portions of the lungs are clear. Hepatobiliary: Although limited due to the lack of intravenous contrast, normal in appearance without gross focal abnormality. No evidence of calcified gallstones or biliary ductal dilatation. Pancreas:  Unremarkable.  No surrounding inflammatory changes. Spleen: Normal in size. Although limited due to the lack of intravenous contrast, normal in appearance. Adrenals/Urinary Tract: Both adrenal glands appear normal. There is perinephric stranding seen around both kidneys and proximal ureteral stranding. Again noted are bilateral partially at exophytic low-density lesions, the largest is off the lower pole of the right kidney measuring 4.4 cm. The bladder is partially decompressed with diffuse bladder wall thickening. No calculi are noted. Stomach/Bowel: The stomach, small bowel, and colon are normal in appearance. No inflammatory changes or obstructive findings. appendix is normal. Vascular/Lymphatic: There are no enlarged abdominal or pelvic lymph nodes. No significant gross vascular findings are present. Reproductive: The prostate is unremarkable. Other: No evidence of abdominal wall mass or hernia. Musculoskeletal: No acute or significant osseous findings. IMPRESSION: 1. Bilateral perinephric and proximal periureteral stranding with diffuse mild bladder wall thickening. This could be due to bilateral pyelonephritis and chronic  cystitis. 2. No renal calculi or hydronephrosis 3. Bilateral simple renal cysts. Electronically Signed   By: Prudencio Pair M.D.   On: 04/17/2019 17:11     Phillips Climes M.D on 04/18/2019 at 10:58 AM  Between 7am to 7pm - Pager - [phone]  After 7pm go to [email or web address] - password [facility]  [facility] -  Office  [phone]

## 2019-04-18 NOTE — Progress Notes (Signed)
Oral temp 101.6, PRN Tylenol given.

## 2019-04-19 ENCOUNTER — Telehealth: Payer: Self-pay | Admitting: *Deleted

## 2019-04-19 DIAGNOSIS — E860 Dehydration: Secondary | ICD-10-CM

## 2019-04-19 LAB — BASIC METABOLIC PANEL
Anion gap: 11 (ref 5–15)
BUN: 13 mg/dL (ref 8–23)
CO2: 24 mmol/L (ref 22–32)
Calcium: 7.9 mg/dL — ABNORMAL LOW (ref 8.9–10.3)
Chloride: 103 mmol/L (ref 98–111)
Creatinine, Ser: 0.91 mg/dL (ref 0.61–1.24)
GFR calc Af Amer: >60 mL/min (ref >60)
GFR calc non Af Amer: >60 mL/min (ref >60)
Glucose, Bld: 122 mg/dL — ABNORMAL HIGH (ref 70–99)
Potassium: 3.2 mmol/L — ABNORMAL LOW (ref 3.5–5.1)
Sodium: 138 mmol/L (ref 135–145)

## 2019-04-19 LAB — CULTURE, BLOOD (ROUTINE X 2)
Special Requests: ADEQUATE
Special Requests: ADEQUATE

## 2019-04-19 LAB — CBC
HCT: 37.8 % — ABNORMAL LOW (ref 39.0–52.0)
Hemoglobin: 13 g/dL (ref 13.0–17.0)
MCH: 30.4 pg (ref 26.0–34.0)
MCHC: 34.4 g/dL (ref 30.0–36.0)
MCV: 88.5 fL (ref 80.0–100.0)
Platelets: 155 10*3/uL (ref 150–400)
RBC: 4.27 MIL/uL (ref 4.22–5.81)
RDW: 13.7 % (ref 11.5–15.5)
WBC: 4.4 10*3/uL (ref 4.0–10.5)
nRBC: 0 % (ref 0.0–0.2)

## 2019-04-19 LAB — URINE CULTURE: Culture: >=100000 — AB

## 2019-04-19 LAB — GLUCOSE, CAPILLARY
Glucose-Capillary: 110 mg/dL — ABNORMAL HIGH (ref 70–99)
Glucose-Capillary: 117 mg/dL — ABNORMAL HIGH (ref 70–99)

## 2019-04-19 MED ORDER — POTASSIUM CHLORIDE CRYS ER 20 MEQ PO TBCR
40.0000 meq | EXTENDED_RELEASE_TABLET | Freq: Once | ORAL | Status: AC
  Administered 2019-04-19: 40 meq via ORAL
  Filled 2019-04-19: qty 2

## 2019-04-19 MED ORDER — CIPROFLOXACIN HCL 500 MG PO TABS: 500.0000 mg | ORAL_TABLET | Freq: Two times a day (BID) | ORAL | 0 refills | Status: AC

## 2019-04-19 NOTE — Telephone Encounter (Signed)
TOC call needed pt d/c 04-19-19 has a toc appt 04-27-19

## 2019-04-19 NOTE — Telephone Encounter (Signed)
Cant call same day of discharge. Will call tomorrow.

## 2019-04-19 NOTE — Plan of Care (Signed)

## 2019-04-19 NOTE — Discharge Instructions (Signed)
Pyelonephritis, Adult ° °Pyelonephritis is an infection that occurs in the kidney. The kidneys are organs that help clean the blood by moving waste out of the blood and into the pee (urine). This infection can happen quickly, or it can last for a long time. In most cases, it clears up with treatment and does not cause other problems. °What are the causes? °This condition may be caused by: °· Germs (bacteria) going from the bladder up to the kidney. This may happen after having a bladder infection. °· Germs going from the blood to the kidney. °What increases the risk? °This condition is more likely to develop in: °· Pregnant women. °· Older people. °· People who have any of these conditions: °? Diabetes. °? Inflammation of the prostate gland (prostatitis), in males. °? Kidney stones or bladder stones. °? Other problems with the kidney or the parts of your body that carry pee from the kidneys to the bladder (ureters). °? Cancer. °· People who have a small, thin tube (catheter) placed in the bladder. °· People who are sexually active. °· Women who use a medicine that kills sperm (spermicide) to prevent pregnancy. °· People who have had a prior urinary tract infection (UTI). °What are the signs or symptoms? °Symptoms of this condition include: °· Peeing often. °· A strong urge to pee right away. °· Burning or stinging when peeing. °· Belly pain. °· Back pain. °· Pain in the side (flank area). °· Fever or chills. °· Blood in the pee, or dark pee. °· Feeling sick to your stomach (nauseous) or throwing up (vomiting). °How is this treated? °This condition may be treated by: °· Taking antibiotic medicines by mouth (orally). °· Drinking enough fluids. °If the infection is bad, you may need to stay in the hospital. You may be given antibiotics and fluids that are put directly into a vein through an IV tube. °In some cases, other treatments may be needed. °Follow these instructions at home: °Medicines °· Take your antibiotic  medicine as told by your doctor. Do not stop taking the antibiotic even if you start to feel better. °· Take over-the-counter and prescription medicines only as told by your doctor. °General instructions ° °· Drink enough fluid to keep your pee pale yellow. °· Avoid caffeine, tea, and carbonated drinks. °· Pee (urinate) often. Avoid holding in pee for long periods of time. °· Pee before and after sex. °· After pooping (having a bowel movement), women should wipe from front to back. Use each tissue only once. °· Keep all follow-up visits as told by your doctor. This is important. °Contact a doctor if: °· You do not feel better after 2 days. °· Your symptoms get worse. °· You have a fever. °Get help right away if: °· You cannot take your medicine or drink fluids as told. °· You have chills and shaking. °· You throw up. °· You have very bad pain in your side or back. °· You feel very weak or you pass out (faint). °Summary °· Pyelonephritis is an infection that occurs in the kidney. °· In most cases, this infection clears up with treatment and does not cause other problems. °· Take your antibiotic medicine as told by your doctor. Do not stop taking the antibiotic even if you start to feel better. °· Drink enough fluid to keep your pee pale yellow. °This information is not intended to replace advice given to you by your health care provider. Make sure you discuss any questions you have with   your health care provider. °Document Released: 10/08/2004 Document Revised: 07/05/2018 Document Reviewed: 07/05/2018 °Elsevier Patient Education © 2020 [employer] ° °

## 2019-04-19 NOTE — Care Management Important Message (Signed)
Important Message  Patient Details  Name: Christopher Lambert MRN: [id number] Date of Birth: 04-16-1956   Medicare Important Message Given:  Yes     Tommy Medal 04/19/2019, 2:41 PM

## 2019-04-19 NOTE — Discharge Summary (Signed)
Physician Discharge Summary  RORY XIANG TDV:[id number] DOB: [birth year] DOA: 04/16/2019  PCP: [name], MD  Admit date: 04/16/2019 Discharge date: 04/19/2019  Admitted From: Home Disposition: Home  Recommendations for Outpatient Follow-up:  1. Follow up with PCP in 1-2 weeks 2. Please obtain BMP/CBC in one week   Discharge Condition: Stable CODE STATUS: Full code Diet recommendation: Heart healthy, carb modified  Brief/Interim Summary: 63 y/o male with a history of diabetes, hypertension, obesity, prior UTI, presents to the hospital with complaints of weakness, fever, body aches.  He was found to have bilateral pyelonephritis, cystitis and associated sepsis.  His initial temperature was noted to be 103.  The patient was admitted to the hospital started on IV fluids and antibiotics.  Urine culture as well as blood cultures positive for E. coli.  He was initially treated with meropenem but based on culture sensitivities, he was transitioned to ciprofloxacin.  He is fevers have resolved.  He is feeling significantly improved.  He is ambulating around the nursing unit without difficulty.  Patient feels ready for discharge home.  He will be discharged home on a course of ciprofloxacin.  The remainder of his medical problems remained stable.  We will continue to hold lisinopril/hydrochlorothiazide while he is still recovering from volume depletion.  Discharge Diagnoses:  Principal Problem:   Sepsis ([place]) Active Problems:   Essential hypertension, benign   Obstructive sleep apnea   Diabetes mellitus type 2 in obese [facility])   Acute lower UTI   Hypokalemia    Discharge Instructions  Discharge Instructions    Diet - low sodium heart healthy   Complete by: As directed    Increase activity slowly   Complete by: As directed      Allergies as of 04/19/2019   No Known Allergies     Medication List    STOP taking these medications   lisinopril-hydrochlorothiazide 20-25 MG  tablet Commonly known as: ZESTORETIC     TAKE these medications   allopurinol 300 MG tablet Commonly known as: ZYLOPRIM Take 1 tablet (300 mg total) by mouth daily.   aspirin 81 MG EC tablet TAKE 1 TABLET EVERY DAY   cholecalciferol 1000 units tablet Commonly known as: VITAMIN D Take 1,000 Units by mouth daily.   ciprofloxacin 500 MG tablet Commonly known as: Cipro Take 1 tablet (500 mg total) by mouth 2 (two) times daily for 10 days.   ELDERBERRY PO Take 1 tablet by mouth daily.   fluticasone 50 MCG/ACT nasal spray Commonly known as: FLONASE PLACE 2 SPRAYS INTO BOTH NOSTRILS 2 (TWO) TIMES DAILY. What changed:   when to take this  reasons to take this   metFORMIN 500 MG tablet Commonly known as: GLUCOPHAGE TAKE 1 TABLET BY MOUTH EVERY DAY WITH BREAKFAST What changed: See the new instructions.   multivitamin with minerals Tabs tablet Take 1 tablet by mouth daily.   NIFEdipine 90 MG 24 hr tablet Commonly known as: PROCARDIA XL/NIFEDICAL-XL TAKE 1 TABLET BY MOUTH EVERY DAY What changed: when to take this   simvastatin 5 MG tablet Commonly known as: ZOCOR TAKE 1 TABLET BY MOUTH EVERYDAY AT BEDTIME What changed: See the new instructions.      Follow-up Information    [name], MD. Schedule an appointment as soon as possible for a visit in 2 week(s).   Specialty: Family Medicine Contact information: [address], [place] [address] [phone]          No Known  Allergies  Consultations:     Procedures/Studies: Dg Chest Portable 1 View  Result Date: 04/16/2019 CLINICAL DATA:  Fever, generalized weakness and body aches EXAM: PORTABLE CHEST 1 VIEW COMPARISON:  May 18, 2012 FINDINGS: The heart size and mediastinal contours are within normal limits. Both lungs are clear. The visualized skeletal structures are unremarkable. IMPRESSION: No acute cardiopulmonary disease identified. Electronically Signed   By: Abelardo Diesel  M.D.   On: 04/16/2019 14:38   Ct Renal Stone Study  Result Date: 04/17/2019 CLINICAL DATA:  Weakness fever body aches EXAM: CT ABDOMEN AND PELVIS WITHOUT CONTRAST TECHNIQUE: Multidetector CT imaging of the abdomen and pelvis was performed following the standard protocol without IV contrast. COMPARISON:  August 12, 2015 FINDINGS: The study is limited due to patient motion. Lower chest: The visualized heart size within normal limits. No pericardial fluid/thickening. No hiatal hernia. The visualized portions of the lungs are clear. Hepatobiliary: Although limited due to the lack of intravenous contrast, normal in appearance without gross focal abnormality. No evidence of calcified gallstones or biliary ductal dilatation. Pancreas:  Unremarkable.  No surrounding inflammatory changes. Spleen: Normal in size. Although limited due to the lack of intravenous contrast, normal in appearance. Adrenals/Urinary Tract: Both adrenal glands appear normal. There is perinephric stranding seen around both kidneys and proximal ureteral stranding. Again noted are bilateral partially at exophytic low-density lesions, the largest is off the lower pole of the right kidney measuring 4.4 cm. The bladder is partially decompressed with diffuse bladder wall thickening. No calculi are noted. Stomach/Bowel: The stomach, small bowel, and colon are normal in appearance. No inflammatory changes or obstructive findings. appendix is normal. Vascular/Lymphatic: There are no enlarged abdominal or pelvic lymph nodes. No significant gross vascular findings are present. Reproductive: The prostate is unremarkable. Other: No evidence of abdominal wall mass or hernia. Musculoskeletal: No acute or significant osseous findings. IMPRESSION: 1. Bilateral perinephric and proximal periureteral stranding with diffuse mild bladder wall thickening. This could be due to bilateral pyelonephritis and chronic cystitis. 2. No renal calculi or hydronephrosis 3.  Bilateral simple renal cysts. Electronically Signed   By: Prudencio Pair M.D.   On: 04/17/2019 17:11       Subjective: Feeling better.  No dizziness or lightheadedness.  No nausea or vomiting.  Discharge Exam: Vitals:   04/18/19 1436 04/18/19 1800 04/18/19 2113 04/19/19 0447  BP: (!) 144/77  119/65 (!) 130/97  Pulse: 78  62 (!) 54  Resp: 20  18 18   Temp: (!) 100.9 F (38.3 C) (!) 101.6 F (38.7 C) 99.7 F (37.6 C) 99.2 F (37.3 C)  TempSrc: Oral Oral Oral Oral  SpO2: 100%   100%  Weight:      Height:        General: Pt is alert, awake, not in acute distress Cardiovascular: RRR, S1/S2 +, no rubs, no gallops Respiratory: CTA bilaterally, no wheezing, no rhonchi Abdominal: Soft, NT, ND, bowel sounds + Extremities: no edema, no cyanosis    The results of significant diagnostics from this hospitalization (including imaging, microbiology, ancillary and laboratory) are listed below for reference.     Microbiology: Recent Results (from the past 240 hour(s))  SARS Coronavirus 2 [facility] order, Performed in [facility] hospital lab) Nasopharyngeal Nasopharyngeal Swab     Status: None   Collection Time: 04/16/19  2:00 PM   Specimen: Nasopharyngeal Swab  Result Value Ref Range Status   SARS Coronavirus 2 NEGATIVE NEGATIVE Final    Comment: (NOTE) If result is NEGATIVE SARS-CoV-2  target nucleic acids are NOT DETECTED. The SARS-CoV-2 RNA is generally detectable in upper and lower  respiratory specimens during the acute phase of infection. The lowest  concentration of SARS-CoV-2 viral copies this assay can detect is 250  copies / mL. A negative result does not preclude SARS-CoV-2 infection  and should not be used as the sole basis for treatment or other  patient management decisions.  A negative result may occur with  improper specimen collection / handling, submission of specimen other  than nasopharyngeal swab, presence of viral mutation(s) within the  areas targeted by this  assay, and inadequate number of viral copies  (<250 copies / mL). A negative result must be combined with clinical  observations, patient history, and epidemiological information. If result is POSITIVE SARS-CoV-2 target nucleic acids are DETECTED. The SARS-CoV-2 RNA is generally detectable in upper and lower  respiratory specimens dur ing the acute phase of infection.  Positive  results are indicative of active infection with SARS-CoV-2.  Clinical  correlation with patient history and other diagnostic information is  necessary to determine patient infection status.  Positive results do  not rule out bacterial infection or co-infection with other viruses. If result is PRESUMPTIVE POSTIVE SARS-CoV-2 nucleic acids MAY BE PRESENT.   A presumptive positive result was obtained on the submitted specimen  and confirmed on repeat testing.  While 2019 novel coronavirus  (SARS-CoV-2) nucleic acids may be present in the submitted sample  additional confirmatory testing may be necessary for epidemiological  and / or clinical management purposes  to differentiate between  SARS-CoV-2 and other Sarbecovirus currently known to infect humans.  If clinically indicated additional testing with an alternate test  methodology [phone]) is advised. The SARS-CoV-2 RNA is generally  detectable in upper and lower respiratory sp ecimens during the acute  phase of infection. The expected result is Negative. Fact Sheet for Patients:  StrictlyIdeas.no Fact Sheet for Healthcare Providers: BankingDealers.co.za This test is not yet approved or cleared by the [place] FDA and has been authorized for detection and/or diagnosis of SARS-CoV-2 by FDA under an Emergency Use Authorization (EUA).  This EUA will remain in effect (meaning this test can be used) for the duration of the COVID-19 declaration under Section 564(b)(1) of the Act, 21 U.S.C. section 360bbb-3(b)(1),  unless the authorization is terminated or revoked sooner. Performed at [facility], [address] [place], [place] [address]   Culture, blood (routine x 2)     Status: Abnormal   Collection Time: 04/16/19  2:19 PM   Specimen: Left Antecubital; Blood  Result Value Ref Range Status   Specimen Description   Final    LEFT ANTECUBITAL BOTTLES DRAWN AEROBIC AND ANAEROBIC Performed at [facility], [address] [place], [place] [address]    Special Requests   Final    Blood Culture adequate volume Performed at [facility], [address] [place], [place] [address]    Culture  Setup Time   Final    GRAM NEGATIVE RODS Gram Stain Report Called to,Read Back By and Verified With: BONDURANCT 0254 ON 04/17/2019 BY EVA IN BOTH AEROBIC AND ANAEROBIC BOTTLES Performed at [place], READ BACK BY AND VERIFIED WITH: RN J RHEW [id number] AT 578 AM  BY CM Performed at [facility], [place] [address], [place],  [address]    Culture ESCHERICHIA COLI (A)  Final   Report Status 04/19/2019 FINAL  Final   Organism ID, Bacteria ESCHERICHIA COLI  Final      Susceptibility   Escherichia coli - MIC*    AMPICILLIN >=32 RESISTANT Resistant     CEFAZOLIN >=64 RESISTANT Resistant     CEFEPIME <=1 SENSITIVE Sensitive     CEFTAZIDIME <=1 SENSITIVE Sensitive     CEFTRIAXONE <=1 SENSITIVE Sensitive     CIPROFLOXACIN <=0.25 SENSITIVE Sensitive     GENTAMICIN <=1 SENSITIVE Sensitive     IMIPENEM <=0.25 SENSITIVE Sensitive     TRIMETH/SULFA <=20 SENSITIVE Sensitive     AMPICILLIN/SULBACTAM >=32 RESISTANT Resistant     PIP/TAZO <=4 SENSITIVE Sensitive     Extended ESBL NEGATIVE Sensitive     * ESCHERICHIA COLI  Blood Culture ID Panel (Reflexed)     Status: Abnormal   Collection Time: 04/16/19  2:19 PM  Result Value Ref Range Status   Enterococcus species NOT DETECTED NOT DETECTED Final   Listeria monocytogenes NOT DETECTED NOT DETECTED Final   Staphylococcus species  NOT DETECTED NOT DETECTED Final   Staphylococcus aureus (BCID) NOT DETECTED NOT DETECTED Final   Streptococcus species NOT DETECTED NOT DETECTED Final   Streptococcus agalactiae NOT DETECTED NOT DETECTED Final   Streptococcus pneumoniae NOT DETECTED NOT DETECTED Final   Streptococcus pyogenes NOT DETECTED NOT DETECTED Final   Acinetobacter baumannii NOT DETECTED NOT DETECTED Final   Enterobacteriaceae species DETECTED (A) NOT DETECTED Final    Comment: Enterobacteriaceae represent a large family of gram-negative bacteria, not a single organism. CRITICAL RESULT CALLED TO, READ BACK BY AND VERIFIED WITH: RN J RHEW [id number] AT 42 AM BY CM    Enterobacter cloacae complex NOT DETECTED NOT DETECTED Final   Escherichia coli DETECTED (A) NOT DETECTED Final    Comment: CRITICAL RESULT CALLED TO, READ BACK BY AND VERIFIED WITH: RN J RHEW [id number] AT 56 AM BY CM    Klebsiella oxytoca NOT DETECTED NOT DETECTED Final   Klebsiella pneumoniae NOT DETECTED NOT DETECTED Final   Proteus species NOT DETECTED NOT DETECTED Final   Serratia marcescens NOT DETECTED NOT DETECTED Final   Carbapenem resistance NOT DETECTED NOT DETECTED Final   Haemophilus influenzae NOT DETECTED NOT DETECTED Final   Neisseria meningitidis NOT DETECTED NOT DETECTED Final   Pseudomonas aeruginosa NOT DETECTED NOT DETECTED Final   Candida albicans NOT DETECTED NOT DETECTED Final   Candida glabrata NOT DETECTED NOT DETECTED Final   Candida krusei NOT DETECTED NOT DETECTED Final   Candida parapsilosis NOT DETECTED NOT DETECTED Final   Candida tropicalis NOT DETECTED NOT DETECTED Final    Comment: Performed at [facility], [place]. [address] [place], [place] [address]  Culture, blood (routine x 2)     Status: Abnormal   Collection Time: 04/16/19  2:29 PM   Specimen: Right Antecubital; Blood  Result Value Ref Range Status   Specimen Description   Final    RIGHT ANTECUBITAL BOTTLES DRAWN AEROBIC AND ANAEROBIC Performed at [facility], [address] [place], [place] [address]    Special Requests   Final    Blood Culture adequate volume Performed at [facility], [address], [place], [place] [address]    Culture  Setup Time   Final    GRAM NEGATIVE RODS Gram Stain Report Called to,Read Back By and Verified With: BONDURANT,R. AT 0254 ON 04/17/2019 BY EVA IN BOTH AEROBIC AND ANAEROBIC BOTTLES Performed at [place], READ BACK BY AND VERIFIED WITH: RN J RHEW [id number] AT 648 AM BY CM    Culture (  A)  Final    ESCHERICHIA COLI SUSCEPTIBILITIES PERFORMED ON PREVIOUS CULTURE WITHIN THE LAST 5 DAYS. Performed at [facility], [place] [address] [place], [place] [address]    Report Status 04/19/2019 FINAL  Final  Urine culture     Status: Abnormal   Collection Time: 04/16/19  4:42 PM   Specimen: Urine, Clean Catch  Result Value Ref Range Status   Specimen Description   Final    URINE, CLEAN CATCH Performed at [facility], [address] [place], [place] [address]    Special Requests   Final    NONE Performed at [facility], [address] [place], [place] [address]    Culture >=100,000 COLONIES/mL ESCHERICHIA COLI (A)  Final   Report Status 04/19/2019 FINAL  Final   Organism ID, Bacteria ESCHERICHIA COLI (A)  Final      Susceptibility   Escherichia coli - MIC*    AMPICILLIN >=32 RESISTANT Resistant     CEFAZOLIN 8 SENSITIVE Sensitive     CEFTRIAXONE <=1 SENSITIVE Sensitive     CIPROFLOXACIN <=0.25 SENSITIVE Sensitive     GENTAMICIN <=1 SENSITIVE Sensitive     IMIPENEM <=0.25 SENSITIVE Sensitive     NITROFURANTOIN <=16 SENSITIVE Sensitive     TRIMETH/SULFA <=20 SENSITIVE Sensitive     AMPICILLIN/SULBACTAM >=32 RESISTANT Resistant     PIP/TAZO <=4 SENSITIVE Sensitive     Extended ESBL NEGATIVE Sensitive     * >=100,000 COLONIES/mL ESCHERICHIA COLI  Culture, blood (routine x 2)     Status: None (Preliminary result)   Collection Time: 04/19/19  6:57 AM   Specimen:  BLOOD RIGHT ARM  Result Value Ref Range Status   Specimen Description BLOOD RIGHT ARM  Final   Special Requests   Final    BOTTLES DRAWN AEROBIC AND ANAEROBIC Blood Culture adequate volume   Culture   Final    NO GROWTH < 12 HOURS Performed at [facility], [address], [place], [place] [address]    Report Status PENDING  Incomplete  Culture, blood (routine x 2)     Status: None (Preliminary result)   Collection Time: 04/19/19  7:05 AM   Specimen: BLOOD RIGHT HAND  Result Value Ref Range Status   Specimen Description BLOOD RIGHT HAND  Final   Special Requests   Final    BOTTLES DRAWN AEROBIC AND ANAEROBIC Blood Culture adequate volume   Culture   Final    NO GROWTH < 12 HOURS Performed at [facility], [address], [place], [place] [address]    Report Status PENDING  Incomplete     Labs: BNP (last 3 results) No results for input(s): BNP in the last 8760 hours. Basic Metabolic Panel: Recent Labs  Lab 04/16/19 1414 04/16/19 1419 04/17/19 0627 04/18/19 0623 04/19/19 0700  NA 134*  --  136 136 138  K 2.7*  --  3.4* 3.6 3.2*  CL 98  --  104 104 103  CO2 22  --  22 22 24   GLUCOSE 162*  --  147* 122* 122*  BUN 19  --  22 17 13   CREATININE 1.38*  --  1.29* 1.07 0.91  CALCIUM 8.3*  --  7.9* 7.7* 7.9*  MG  --  1.7  --   --   --    Liver Function Tests: Recent Labs  Lab 04/16/19 1414  AST 25  ALT 30  ALKPHOS 83  BILITOT 2.1*  PROT 7.3  ALBUMIN 3.5   No results  for input(s): LIPASE, AMYLASE in the last 168 hours. No results for input(s): AMMONIA in the last 168 hours. CBC: Recent Labs  Lab 04/16/19 1414 04/17/19 0627 04/18/19 0623 04/19/19 0700  WBC 11.4* 10.3 6.4 4.4  NEUTROABS 10.6*  --   --   --   HGB 14.5 13.4 13.9 13.0  HCT 42.3 39.7 41.1 37.8*  MCV 89.4 90.0 90.7 88.5  PLT 153 150 150 155   Cardiac Enzymes: No results for input(s): CKTOTAL, CKMB, CKMBINDEX, TROPONINI in the last 168 hours. BNP: Invalid input(s): POCBNP CBG: Recent Labs  Lab  04/18/19 1143 04/18/19 1702 04/18/19 2114 04/19/19 0805 04/19/19 1147  GLUCAP 134* 125* 112* 110* 117*   D-Dimer No results for input(s): DDIMER in the last 72 hours. Hgb A1c No results for input(s): HGBA1C in the last 72 hours. Lipid Profile No results for input(s): CHOL, HDL, LDLCALC, TRIG, CHOLHDL, LDLDIRECT in the last 72 hours. Thyroid function studies No results for input(s): TSH, T4TOTAL, T3FREE, THYROIDAB in the last 72 hours.  Invalid input(s): FREET3 Anemia work up No results for input(s): VITAMINB12, FOLATE, FERRITIN, TIBC, IRON, RETICCTPCT in the last 72 hours. Urinalysis    Component Value Date/Time   COLORURINE AMBER (A) 04/16/2019 1642   APPEARANCEUR CLOUDY (A) 04/16/2019 1642   LABSPEC 1.019 04/16/2019 1642   PHURINE 5.0 04/16/2019 1642   GLUCOSEU NEGATIVE 04/16/2019 1642   HGBUR LARGE (A) 04/16/2019 1642   BILIRUBINUR NEGATIVE 04/16/2019 1642   BILIRUBINUR small 06/22/2016 1310   KETONESUR NEGATIVE 04/16/2019 1642   PROTEINUR 30 (A) 04/16/2019 1642   UROBILINOGEN 1.0 06/22/2016 1310   UROBILINOGEN 0.2 05/12/2012 0901   NITRITE NEGATIVE 04/16/2019 1642   LEUKOCYTESUR MODERATE (A) 04/16/2019 1642   Sepsis Labs Invalid input(s): PROCALCITONIN,  WBC,  LACTICIDVEN Microbiology Recent Results (from the past 240 hour(s))  SARS Coronavirus 2 [facility] order, Performed in [facility] hospital lab) Nasopharyngeal Nasopharyngeal Swab     Status: None   Collection Time: 04/16/19  2:00 PM   Specimen: Nasopharyngeal Swab  Result Value Ref Range Status   SARS Coronavirus 2 NEGATIVE NEGATIVE Final    Comment: (NOTE) If result is NEGATIVE SARS-CoV-2 target nucleic acids are NOT DETECTED. The SARS-CoV-2 RNA is generally detectable in upper and lower  respiratory specimens during the acute phase of infection. The lowest  concentration of SARS-CoV-2 viral copies this assay can detect is 250  copies / mL. A negative result does not preclude SARS-CoV-2 infection  and  should not be used as the sole basis for treatment or other  patient management decisions.  A negative result may occur with  improper specimen collection / handling, submission of specimen other  than nasopharyngeal swab, presence of viral mutation(s) within the  areas targeted by this assay, and inadequate number of viral copies  (<250 copies / mL). A negative result must be combined with clinical  observations, patient history, and epidemiological information. If result is POSITIVE SARS-CoV-2 target nucleic acids are DETECTED. The SARS-CoV-2 RNA is generally detectable in upper and lower  respiratory specimens dur ing the acute phase of infection.  Positive  results are indicative of active infection with SARS-CoV-2.  Clinical  correlation with patient history and other diagnostic information is  necessary to determine patient infection status.  Positive results do  not rule out bacterial infection or co-infection with other viruses. If result is PRESUMPTIVE POSTIVE SARS-CoV-2 nucleic acids MAY BE PRESENT.   A presumptive positive result was obtained on the submitted specimen  and  confirmed on repeat testing.  While 2019 novel coronavirus  (SARS-CoV-2) nucleic acids may be present in the submitted sample  additional confirmatory testing may be necessary for epidemiological  and / or clinical management purposes  to differentiate between  SARS-CoV-2 and other Sarbecovirus currently known to infect humans.  If clinically indicated additional testing with an alternate test  methodology [phone]) is advised. The SARS-CoV-2 RNA is generally  detectable in upper and lower respiratory sp ecimens during the acute  phase of infection. The expected result is Negative. Fact Sheet for Patients:  StrictlyIdeas.no Fact Sheet for Healthcare Providers: BankingDealers.co.za This test is not yet approved or cleared by the [place] FDA and has been  authorized for detection and/or diagnosis of SARS-CoV-2 by FDA under an Emergency Use Authorization (EUA).  This EUA will remain in effect (meaning this test can be used) for the duration of the COVID-19 declaration under Section 564(b)(1) of the Act, 21 U.S.C. section 360bbb-3(b)(1), unless the authorization is terminated or revoked sooner. Performed at [facility], [address] [place], [place] [address]   Culture, blood (routine x 2)     Status: Abnormal   Collection Time: 04/16/19  2:19 PM   Specimen: Left Antecubital; Blood  Result Value Ref Range Status   Specimen Description   Final    LEFT ANTECUBITAL BOTTLES DRAWN AEROBIC AND ANAEROBIC Performed at [facility], [address], [place], [place] [address]    Special Requests   Final    Blood Culture adequate volume Performed at [facility], [address] [place], [place] [address]    Culture  Setup Time   Final    GRAM NEGATIVE RODS Gram Stain Report Called to,Read Back By and Verified With: BONDURANCT 0254 ON 04/17/2019 BY EVA IN BOTH AEROBIC AND ANAEROBIC BOTTLES Performed at [place], READ BACK BY AND VERIFIED WITH: RN J RHEW [id number] AT 706 AM  BY CM Performed at [facility], [place] [address], [place], [place] [address]    Culture ESCHERICHIA COLI (A)  Final   Report Status 04/19/2019 FINAL  Final   Organism ID, Bacteria ESCHERICHIA COLI  Final      Susceptibility   Escherichia coli - MIC*    AMPICILLIN >=32 RESISTANT Resistant     CEFAZOLIN >=64 RESISTANT Resistant     CEFEPIME <=1 SENSITIVE Sensitive     CEFTAZIDIME <=1 SENSITIVE Sensitive     CEFTRIAXONE <=1 SENSITIVE Sensitive     CIPROFLOXACIN <=0.25 SENSITIVE Sensitive     GENTAMICIN <=1 SENSITIVE Sensitive     IMIPENEM <=0.25 SENSITIVE Sensitive     TRIMETH/SULFA <=20 SENSITIVE Sensitive     AMPICILLIN/SULBACTAM >=32 RESISTANT Resistant     PIP/TAZO <=4 SENSITIVE Sensitive     Extended ESBL NEGATIVE Sensitive      * ESCHERICHIA COLI  Blood Culture ID Panel (Reflexed)     Status: Abnormal   Collection Time: 04/16/19  2:19 PM  Result Value Ref Range Status   Enterococcus species NOT DETECTED NOT DETECTED Final   Listeria monocytogenes NOT DETECTED NOT DETECTED Final   Staphylococcus species NOT DETECTED NOT DETECTED Final   Staphylococcus aureus (BCID) NOT DETECTED NOT DETECTED Final   Streptococcus species NOT DETECTED NOT DETECTED Final   Streptococcus agalactiae NOT DETECTED NOT DETECTED Final   Streptococcus pneumoniae NOT DETECTED NOT DETECTED Final   Streptococcus pyogenes NOT DETECTED NOT DETECTED Final   Acinetobacter baumannii NOT DETECTED NOT DETECTED Final   Enterobacteriaceae species  DETECTED (A) NOT DETECTED Final    Comment: Enterobacteriaceae represent a large family of gram-negative bacteria, not a single organism. CRITICAL RESULT CALLED TO, READ BACK BY AND VERIFIED WITH: RN J RHEW [id number] AT 59 AM BY CM    Enterobacter cloacae complex NOT DETECTED NOT DETECTED Final   Escherichia coli DETECTED (A) NOT DETECTED Final    Comment: CRITICAL RESULT CALLED TO, READ BACK BY AND VERIFIED WITH: RN J RHEW [id number] AT 62 AM BY CM    Klebsiella oxytoca NOT DETECTED NOT DETECTED Final   Klebsiella pneumoniae NOT DETECTED NOT DETECTED Final   Proteus species NOT DETECTED NOT DETECTED Final   Serratia marcescens NOT DETECTED NOT DETECTED Final   Carbapenem resistance NOT DETECTED NOT DETECTED Final   Haemophilus influenzae NOT DETECTED NOT DETECTED Final   Neisseria meningitidis NOT DETECTED NOT DETECTED Final   Pseudomonas aeruginosa NOT DETECTED NOT DETECTED Final   Candida albicans NOT DETECTED NOT DETECTED Final   Candida glabrata NOT DETECTED NOT DETECTED Final   Candida krusei NOT DETECTED NOT DETECTED Final   Candida parapsilosis NOT DETECTED NOT DETECTED Final   Candida tropicalis NOT DETECTED NOT DETECTED Final    Comment: Performed at [facility], [place]. [address]  [place], [place] [address]  Culture, blood (routine x 2)     Status: Abnormal   Collection Time: 04/16/19  2:29 PM   Specimen: Right Antecubital; Blood  Result Value Ref Range Status   Specimen Description   Final    RIGHT ANTECUBITAL BOTTLES DRAWN AEROBIC AND ANAEROBIC Performed at [facility], [address], [place], [place] [address]    Special Requests   Final    Blood Culture adequate volume Performed at [facility], [address] [place], [place] [address]    Culture  Setup Time   Final    GRAM NEGATIVE RODS Gram Stain Report Called to,Read Back By and Verified With: BONDURANT,R. AT 0254 ON 04/17/2019 BY EVA IN BOTH AEROBIC AND ANAEROBIC BOTTLES Performed at [place], READ BACK BY AND VERIFIED WITH: RN J RHEW [id number] AT 12 AM BY CM    Culture (A)  Final    ESCHERICHIA COLI SUSCEPTIBILITIES PERFORMED ON PREVIOUS CULTURE WITHIN THE LAST 5 DAYS. Performed at [facility], [place] [address], [place], [place] [address]    Report Status 04/19/2019 FINAL  Final  Urine culture     Status: Abnormal   Collection Time: 04/16/19  4:42 PM   Specimen: Urine, Clean Catch  Result Value Ref Range Status   Specimen Description   Final    URINE, CLEAN CATCH Performed at [facility], [address], [place], [place] [address]    Special Requests   Final    NONE Performed at [facility], [address], [place], [place] [address]    Culture >=100,000 COLONIES/mL ESCHERICHIA COLI (A)  Final   Report Status 04/19/2019 FINAL  Final   Organism ID, Bacteria ESCHERICHIA COLI (A)  Final      Susceptibility   Escherichia coli - MIC*    AMPICILLIN >=32 RESISTANT Resistant     CEFAZOLIN 8 SENSITIVE Sensitive     CEFTRIAXONE <=1 SENSITIVE Sensitive     CIPROFLOXACIN <=0.25 SENSITIVE Sensitive     GENTAMICIN <=1 SENSITIVE Sensitive     IMIPENEM <=0.25 SENSITIVE Sensitive     NITROFURANTOIN <=16 SENSITIVE Sensitive     TRIMETH/SULFA <=20 SENSITIVE Sensitive      AMPICILLIN/SULBACTAM >=32 RESISTANT  Resistant     PIP/TAZO <=4 SENSITIVE Sensitive     Extended ESBL NEGATIVE Sensitive     * >=100,000 COLONIES/mL ESCHERICHIA COLI  Culture, blood (routine x 2)     Status: None (Preliminary result)   Collection Time: 04/19/19  6:57 AM   Specimen: BLOOD RIGHT ARM  Result Value Ref Range Status   Specimen Description BLOOD RIGHT ARM  Final   Special Requests   Final    BOTTLES DRAWN AEROBIC AND ANAEROBIC Blood Culture adequate volume   Culture   Final    NO GROWTH < 12 HOURS Performed at [facility], [address] [place], [place] [address]    Report Status PENDING  Incomplete  Culture, blood (routine x 2)     Status: None (Preliminary result)   Collection Time: 04/19/19  7:05 AM   Specimen: BLOOD RIGHT HAND  Result Value Ref Range Status   Specimen Description BLOOD RIGHT HAND  Final   Special Requests   Final    BOTTLES DRAWN AEROBIC AND ANAEROBIC Blood Culture adequate volume   Culture   Final    NO GROWTH < 12 HOURS Performed at [facility], [address], [place], [place] [address]    Report Status PENDING  Incomplete     Time coordinating discharge: 63mins  SIGNED:   Kathie Dike, MD  [facility] 04/19/2019, 9:08 PM   If 7PM-7AM, please contact night-coverage [email or web address]

## 2019-04-20 ENCOUNTER — Telehealth: Payer: Self-pay

## 2019-04-20 NOTE — Telephone Encounter (Signed)
Transition Care Management Follow-up Telephone Call   Date discharged?   04/19/2019             How have you been since you were released from the hospital? Pretty good, moving a little slow but feeling pretty good.   Do you understand why you were in the hospital? Kidney infection that went to the bladder   Do you understand the discharge instructions? yes   Where were you discharged to? home   Items Reviewed:  Medications reviewed: yes  Allergies reviewed: yes  Dietary changes reviewed: yes  Referrals reviewed: yes   Functional Questionnaire:   Activities of Daily Living (ADLs):  yes    Any transportation issues/concerns?: no   Any patient concerns? no   Confirmed importance and date/time of follow-up visits scheduled Aug 13 at 9     Confirmed with patient if condition begins to worsen call PCP or go to the ER.  Patient was given the office number and encouraged to call back with question or concerns.  Yes with verbal understanding.

## 2019-04-20 NOTE — Telephone Encounter (Signed)
TOC call completed 

## 2019-04-24 LAB — CULTURE, BLOOD (ROUTINE X 2)
Culture: NO GROWTH
Culture: NO GROWTH
Special Requests: ADEQUATE
Special Requests: ADEQUATE

## 2019-04-27 ENCOUNTER — Encounter: Payer: Self-pay | Admitting: Family Medicine

## 2019-04-27 ENCOUNTER — Other Ambulatory Visit: Payer: Self-pay

## 2019-04-27 ENCOUNTER — Ambulatory Visit (INDEPENDENT_AMBULATORY_CARE_PROVIDER_SITE_OTHER): Payer: Medicare Other | Admitting: Family Medicine

## 2019-04-27 VITALS — BP 118/80 | HR 80 | Temp 98.6°F | Ht 67.5 in | Wt 283.0 lb

## 2019-04-27 DIAGNOSIS — E669 Obesity, unspecified: Secondary | ICD-10-CM

## 2019-04-27 DIAGNOSIS — E1169 Type 2 diabetes mellitus with other specified complication: Secondary | ICD-10-CM | POA: Diagnosis not present

## 2019-04-27 DIAGNOSIS — Z09 Encounter for follow-up examination after completed treatment for conditions other than malignant neoplasm: Secondary | ICD-10-CM | POA: Diagnosis not present

## 2019-04-27 DIAGNOSIS — I10 Essential (primary) hypertension: Secondary | ICD-10-CM | POA: Diagnosis not present

## 2019-04-27 NOTE — Patient Instructions (Signed)
F/U in 5 weeks with MD re eval blood pressure and for flu vac , call if you need me before  Thankful much better and caught early  Labs today, CBC, chem 7 and EGFR and microalb  Please do NOT re start zestoretic, blood pressure is normal today   Work on weight loss, lots of vegetables, low salt diet, 64 plus oz of water daily and exer cise for 30 mins every  Foot exam to day is good  Thanks for choosing [facility], we consider it a privelige to serve you.

## 2019-04-27 NOTE — Assessment & Plan Note (Signed)
Patient in for follow up of recent hospitalization. Discharge summary, and laboratory and radiology data are reviewed, and any questions or concerns about recent hospitalization are discussed. Specific issues requiring follow up are specifically addressed.  

## 2019-04-27 NOTE — Assessment & Plan Note (Signed)
Normotensive off of zestoretic, will need re eval in 4 months, in home testing encouraged DASH diet and commitment to daily physical activity for a minimum of 30 minutes discussed and encouraged, as a part of hypertension management. The importance of attaining a healthy weight is also discussed.  BP/Weight 04/27/2019 04/19/2019 04/16/2019 04/11/2019 11/24/2018 05/09/2018 5/63/1497  Systolic BP 026 378 - 588 502 774 128  Diastolic BP 80 97 - 72 786 86 79  Wt. (Lbs) 283 - 280 285.08 274.2 285.04 -  BMI 43.67 - 43.21 43.99 43.27 43.98 -

## 2019-04-27 NOTE — Progress Notes (Signed)
Christopher Lambert     MRN: [id number]      DOB: Feb 11, 1956   HPI Mr. Stein is here for follow up following hospitalization for urosepsis ( negative blood culture) between 08/02 to 04/19/2019. hospital course and results are reviewed and all questions answered . Pt does have a scheduled Urology appt already arranged ROS  Denies sinus pressure, nasal congestion, ear pain or sore throat. Denies chest congestion, productive cough or wheezing. Denies chest pains, palpitations and leg swelling Denies abdominal pain, nausea, vomiting,diarrhea or constipation.   Denies dysuria, frequency, hesitancy or incontinence. Denies joint pain, swelling and limitation in mobility. Denies headaches, seizures, numbness, or tingling. Denies depression, anxiety or insomnia. Denies skin break down or rash.   PE  BP 118/80   Pulse 80   Temp 98.6 F (37 C) (Temporal)   Ht 5' 7.5" (1.715 m)   Wt 283 lb (128.4 kg)   SpO2 97%   BMI 43.67 kg/m   Patient alert and oriented and in no cardiopulmonary distress.  HEENT: No facial asymmetry, EOMI,   oropharynx pink and moist.  Neck supple no JVD, no mass.  Chest: Clear to auscultation bilaterally.  CVS: S1, S2 no murmurs, no S3.Regular rate.  ABD: Soft non tender. No renal angle tenders  Ext: No edema  MS: Adequate ROM spine, shoulders, hips and knees.  Skin: Intact, no ulcerations or rash noted.  Psych: Good eye contact, normal affect. Memory intact not anxious or depressed appearing.  CNS: CN 2-12 intact, power,  normal throughout.no focal deficits noted.   [facility] discharge follow-up Patient in for follow up of recent hospitalization. Discharge summary, and laboratory and radiology data are reviewed, and any questions or concerns about recent hospitalization are discussed. Specific issues requiring follow up are specifically addressed.   Morbid obesity  Patient re-educated about  the importance of commitment to a   minimum of 150 minutes of exercise per week as able.  The importance of healthy food choices with portion control discussed, as well as eating regularly and within a 12 hour window most days. The need to choose "clean , green" food 50 to 75% of the time is discussed, as well as to make water the primary drink and set a goal of 64 ounces water daily.    Weight /BMI 04/27/2019 04/16/2019 04/11/2019  WEIGHT 283 lb 280 lb 285 lb 1.3 oz  HEIGHT 5' 7.5" 5' 7.5" 5' 7.5"  BMI 43.67 kg/m2 43.21 kg/m2 43.99 kg/m2      Diabetes mellitus type 2 in obese (HCC) Controlled, no change in medication Mr. Manninen is reminded of the importance of commitment to daily physical activity for 30 minutes or more, as able and the need to limit carbohydrate intake to 30 to 60 grams per meal to help with blood sugar control.   The need to take medication as prescribed, test blood sugar as directed, and to call between visits if there is a concern that blood sugar is uncontrolled is also discussed.   Mr. Beehler is reminded of the importance of daily foot exam, annual eye examination, and good blood sugar, blood pressure and cholesterol control.  Diabetic Labs Latest Ref Rng & Units 04/19/2019 04/18/2019 04/17/2019 04/16/2019 04/11/2019  HbA1c <5.7 % of total Hgb - - - - 6.4(H)  Microalbumin Not Estab. ug/mL - - - - -  Micro/Creat Ratio 0.0 - 30.0 mg/g creat - - - - -  Chol <200 mg/dL - - - -  154  HDL > OR = 40 mg/dL - - - - 39(L)  Calc LDL mg/dL (calc) - - - - 90  Triglycerides <150 mg/dL - - - - 146  Creatinine 0.61 - 1.24 mg/dL 0.91 1.07 1.29(H) 1.38(H) 1.09   BP/Weight 04/27/2019 04/19/2019 04/16/2019 04/11/2019 11/24/2018 05/09/2018 0/35/0093  Systolic BP 818 299 - 371 696 789 381  Diastolic BP 80 97 - 72 017 86 79  Wt. (Lbs) 283 - 280 285.08 274.2 285.04 -  BMI 43.67 - 43.21 43.99 43.27 43.98 -   Foot/eye exam completion dates Latest Ref Rng & Units 04/27/2019 02/02/2019  Eye Exam No Retinopathy - No Retinopathy  Foot Form  Completion - Done -        Essential hypertension, benign Normotensive off of zestoretic, will need re eval in 4 months, in home testing encouraged DASH diet and commitment to daily physical activity for a minimum of 30 minutes discussed and encouraged, as a part of hypertension management. The importance of attaining a healthy weight is also discussed.  BP/Weight 04/27/2019 04/19/2019 04/16/2019 04/11/2019 11/24/2018 05/09/2018 01/22/2584  Systolic BP 277 824 - 235 361 443 154  Diastolic BP 80 97 - 72 008 86 79  Wt. (Lbs) 283 - 280 285.08 274.2 285.04 -  BMI 43.67 - 43.21 43.99 43.27 43.98 -

## 2019-04-27 NOTE — Assessment & Plan Note (Signed)
  Patient re-educated about  the importance of commitment to a  minimum of 150 minutes of exercise per week as able.  The importance of healthy food choices with portion control discussed, as well as eating regularly and within a 12 hour window most days. The need to choose "clean , green" food 50 to 75% of the time is discussed, as well as to make water the primary drink and set a goal of 64 ounces water daily.    Weight /BMI 04/27/2019 04/16/2019 04/11/2019  WEIGHT 283 lb 280 lb 285 lb 1.3 oz  HEIGHT 5' 7.5" 5' 7.5" 5' 7.5"  BMI 43.67 kg/m2 43.21 kg/m2 43.99 kg/m2

## 2019-04-27 NOTE — Assessment & Plan Note (Signed)
Controlled, no change in medication Christopher Lambert is reminded of the importance of commitment to daily physical activity for 30 minutes or more, as able and the need to limit carbohydrate intake to 30 to 60 grams per meal to help with blood sugar control.   The need to take medication as prescribed, test blood sugar as directed, and to call between visits if there is a concern that blood sugar is uncontrolled is also discussed.   Christopher Lambert is reminded of the importance of daily foot exam, annual eye examination, and good blood sugar, blood pressure and cholesterol control.  Diabetic Labs Latest Ref Rng & Units 04/19/2019 04/18/2019 04/17/2019 04/16/2019 04/11/2019  HbA1c <5.7 % of total Hgb - - - - 6.4(H)  Microalbumin Not Estab. ug/mL - - - - -  Micro/Creat Ratio 0.0 - 30.0 mg/g creat - - - - -  Chol <200 mg/dL - - - - 154  HDL > OR = 40 mg/dL - - - - 39(L)  Calc LDL mg/dL (calc) - - - - 90  Triglycerides <150 mg/dL - - - - 146  Creatinine 0.61 - 1.24 mg/dL 0.91 1.07 1.29(H) 1.38(H) 1.09   BP/Weight 04/27/2019 04/19/2019 04/16/2019 04/11/2019 11/24/2018 05/09/2018 8/97/8478  Systolic BP 412 820 - 813 887 195 974  Diastolic BP 80 97 - 72 718 86 79  Wt. (Lbs) 283 - 280 285.08 274.2 285.04 -  BMI 43.67 - 43.21 43.99 43.27 43.98 -   Foot/eye exam completion dates Latest Ref Rng & Units 04/27/2019 02/02/2019  Eye Exam No Retinopathy - No Retinopathy  Foot Form Completion - Done -

## 2019-04-28 LAB — BASIC METABOLIC PANEL WITH GFR
BUN: 11 mg/dL (ref 7–25)
CO2: 26 mmol/L (ref 20–32)
Calcium: 8.7 mg/dL (ref 8.6–10.3)
Chloride: 106 mmol/L (ref 98–110)
Creat: 0.86 mg/dL (ref 0.70–1.25)
GFR, Est African American: 108 mL/min/{1.73_m2} (ref >=60)
GFR, Est Non African American: 93 mL/min/{1.73_m2} (ref >=60)
Glucose, Bld: 106 mg/dL — ABNORMAL HIGH (ref 65–99)
Potassium: 3.9 mmol/L (ref 3.5–5.3)
Sodium: 141 mmol/L (ref 135–146)

## 2019-04-28 LAB — MICROALBUMIN / CREATININE URINE RATIO
Creatinine, Urine: 80 mg/dL (ref 20–320)
Microalb, Ur: <0.2 mg/dL

## 2019-04-28 LAB — CBC
HCT: 38.7 % (ref 38.5–50.0)
Hemoglobin: 13 g/dL — ABNORMAL LOW (ref 13.2–17.1)
MCH: 30.4 pg (ref 27.0–33.0)
MCHC: 33.6 g/dL (ref 32.0–36.0)
MCV: 90.6 fL (ref 80.0–100.0)
MPV: 9 fL (ref 7.5–12.5)
Platelets: 341 10*3/uL (ref 140–400)
RBC: 4.27 10*6/uL (ref 4.20–5.80)
RDW: 13.6 % (ref 11.0–15.0)
WBC: 4.5 10*3/uL (ref 3.8–10.8)

## 2019-05-06 ENCOUNTER — Other Ambulatory Visit: Payer: Self-pay | Admitting: Family Medicine

## 2019-05-08 ENCOUNTER — Ambulatory Visit (INDEPENDENT_AMBULATORY_CARE_PROVIDER_SITE_OTHER): Payer: Medicare Other

## 2019-05-08 ENCOUNTER — Other Ambulatory Visit: Payer: Self-pay

## 2019-05-08 DIAGNOSIS — Z23 Encounter for immunization: Secondary | ICD-10-CM

## 2019-05-17 ENCOUNTER — Ambulatory Visit (INDEPENDENT_AMBULATORY_CARE_PROVIDER_SITE_OTHER): Payer: Medicare Other | Admitting: Urology

## 2019-05-17 DIAGNOSIS — R972 Elevated prostate specific antigen [PSA]: Secondary | ICD-10-CM | POA: Diagnosis not present

## 2019-05-30 ENCOUNTER — Other Ambulatory Visit: Payer: Self-pay

## 2019-05-30 ENCOUNTER — Encounter: Payer: Self-pay | Admitting: Family Medicine

## 2019-05-30 ENCOUNTER — Ambulatory Visit (INDEPENDENT_AMBULATORY_CARE_PROVIDER_SITE_OTHER): Payer: Medicare Other | Admitting: Family Medicine

## 2019-05-30 VITALS — BP 138/80 | HR 81 | Temp 98.9°F | Resp 12 | Ht 67.5 in | Wt 283.1 lb

## 2019-05-30 DIAGNOSIS — Z0000 Encounter for general adult medical examination without abnormal findings: Secondary | ICD-10-CM | POA: Diagnosis not present

## 2019-05-30 NOTE — Progress Notes (Signed)
Subjective:   Christopher Lambert is a 63 y.o. male who presents for Medicare Annual/Subsequent preventive examination.  Review of Systems:    Cardiac Risk Factors include: advanced age (>45men, >8 women);diabetes mellitus;dyslipidemia;hypertension;obesity (BMI >30kg/m2);male gender     Objective:    Vitals: BP 138/80   Pulse 81   Temp 98.9 F (37.2 C) (Oral)   Resp 12   Ht 5' 7.5" (1.715 m)   Wt 283 lb 1.9 oz (128.4 kg)   SpO2 98%   BMI 43.69 kg/m   Body mass index is 43.69 kg/m.  Advanced Directives 04/16/2019 04/16/2019 03/23/2018 05/20/2012 05/18/2012 05/12/2012 09/25/2011  Does Patient Have a Medical Advance Directive? No No No Patient does not have advance directive - Patient does not have advance directive;Patient would like information Patient does not have advance directive  Would patient like information on creating a medical advance directive? Yes (Inpatient - patient requests chaplain consult to create a medical advance directive) - - Advance directive packet given - Advance directive packet given -  Pre-existing out of facility DNR order (yellow form or pink MOST form) - - - No No No No    Tobacco Social History   Tobacco Use  Smoking Status Never Smoker  Smokeless Tobacco Never Used     Counseling given: Yes   Clinical Intake:  Pre-visit preparation completed: Yes  Pain : No/denies pain Pain Score: 0-No pain     BMI - recorded: 43.69 Nutritional Status: BMI > 30  Obese Nutritional Risks: None Diabetes: Yes CBG done?: No Did pt. bring in CBG monitor from home?: No  How often do you need to have someone help you when you read instructions, pamphlets, or other written materials from your doctor or pharmacy?: 1 - Never What is the last grade level you completed in school?: 12  Interpreter Needed?: No     Past Medical History:  Diagnosis Date  . Arthritis of knee    Bilateral  . 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 ED (erectile  dysfunction)   . Essential hypertension, benign 1990  . H/O: whooping cough    aschild  . Hemorrhoids, internal   . Obesity   . Seasonal allergies   . Sleep apnea 2004   wears CPAP nightly     Past Surgical History:  Procedure Laterality Date  . CARDIAC CATHETERIZATION  09/25/2011  . CARPAL TUNNEL RELEASE     Right   . COLONOSCOPY N/A 03/23/2018   Procedure: COLONOSCOPY;  Surgeon: Daneil Dolin, MD;  [facility];  Service: Endoscopy;  Laterality: N/A;  8:00  . HERNIA REPAIR  [id number] approx   umbilical  . JOINT REPLACEMENT  2011   right knee  . JOINT REPLACEMENT  2013   left knee  . left knee replacement   sept 2013  . POLYPECTOMY  03/23/2018   Procedure: POLYPECTOMY;  Surgeon: Daneil Dolin, MD;  Location: [facility];  Service: Endoscopy;;  colon  . Right knee replacement  06/2010  . TOTAL KNEE ARTHROPLASTY  05/18/2012   Procedure: TOTAL KNEE ARTHROPLASTY;  Surgeon: Ninetta Lights, MD;  Location: [facility];  Service: Orthopedics;  Laterality: Left;  left total knee arthroplasty  . UMBILICAL HERNIA REPAIR     Family History  Problem Relation Age of Onset  . Coronary artery disease Brother 48       Premature  . Hypertension Brother   . Hypertension Father   .  Cancer Father 45       colon   . Hypertension Mother   . Diabetes Brother   . Hypertension Sister   . Hypertension Sister   . Hypertension Sister   . Hypertension Sister    Social History   Socioeconomic History  . Marital status: Married    Spouse name: [place]  . Number of children: 0  . Years of education: Not on file  . Highest education level: Not on file  Occupational History    Employer: [employer]  Social Needs  . Financial resource strain: Not on file  . Food insecurity    Worry: Not on file    Inability: Not on file  . Transportation needs    Medical: Not on file    Non-medical: Not on file  Tobacco Use  . Smoking status: Never Smoker  . Smokeless tobacco: Never  Used  Substance and Sexual Activity  . Alcohol use: No  . Drug use: No  . Sexual activity: Yes  Lifestyle  . Physical activity    Days per week: Not on file    Minutes per session: Not on file  . Stress: Not on file  Relationships  . Social Herbalist on phone: Not on file    Gets together: Not on file    Attends religious service: Not on file    Active member of club or organization: Not on file    Attends meetings of clubs or organizations: Not on file    Relationship status: Not on file  Other Topics Concern  . Not on file  Social History Narrative  . Not on file    Outpatient Encounter Medications as of 05/30/2019  Medication Sig  . allopurinol (ZYLOPRIM) 300 MG tablet Take 1 tablet (300 mg total) by mouth daily.  Marland Kitchen aspirin 81 MG EC tablet TAKE 1 TABLET EVERY DAY (Patient taking differently: Take 81 mg by mouth daily. )  . cholecalciferol (VITAMIN D) 1000 UNITS tablet Take 1,000 Units by mouth daily.    Marland Kitchen ELDERBERRY PO Take 1 tablet by mouth daily.  . fluticasone (FLONASE) 50 MCG/ACT nasal spray PLACE 2 SPRAYS INTO BOTH NOSTRILS 2 (TWO) TIMES DAILY. (Patient taking differently: Place 2 sprays into both nostrils 2 (two) times daily as needed for allergies or rhinitis. )  . metFORMIN (GLUCOPHAGE) 500 MG tablet TAKE 1 TABLET BY MOUTH EVERY DAY WITH BREAKFAST (Patient taking differently: Take 500 mg by mouth daily with breakfast. )  . Multiple Vitamin (MULTIVITAMIN WITH MINERALS) TABS tablet Take 1 tablet by mouth daily.  Marland Kitchen NIFEdipine (PROCARDIA XL/NIFEDICAL-XL) 90 MG 24 hr tablet TAKE 1 TABLET BY MOUTH EVERY DAY  . simvastatin (ZOCOR) 5 MG tablet TAKE 1 TABLET BY MOUTH EVERYDAY AT BEDTIME (Patient taking differently: Take 5 mg by mouth every morning. )   No facility-administered encounter medications on file as of 05/30/2019.     Activities of Daily Living In your present state of health, do you have any difficulty performing the following activities: 05/30/2019  04/16/2019  Hearing? N N  Vision? N N  Difficulty concentrating or making decisions? N N  Walking or climbing stairs? N N  Dressing or bathing? N N  Doing errands, shopping? N N  Preparing Food and eating ? N -  Using the Toilet? N -  In the past six months, have you accidently leaked urine? N -  Do you have problems with loss of bowel control? N -  Managing your Medications? N -  Managing your Finances? N -  Housekeeping or managing your Housekeeping? N -  Some recent data might be hidden    Patient Care Team: [name], MD as PCP - General Deneise Lever, MD as Consulting Physician (Pulmonary Disease)   Assessment:   This is a routine wellness examination for Abem.  Exercise Activities and Dietary recommendations Current Exercise Habits: The patient does not participate in regular exercise at present, Exercise limited by: None identified  Goals    . Weight (lb) < 250 lb (113.4 kg)       Fall Risk Fall Risk  05/30/2019 04/27/2019 04/11/2019 05/09/2018 01/05/2018  Falls in the past year? 0 0 0 No No  Number falls in past yr: 0 0 - - -  Injury with Fall? 0 0 0 - -   Is the patient's home free of loose throw rugs in walkways, pet beds, electrical cords, etc?   yes      Grab bars in the bathroom? yes      Handrails on the stairs?   yes      Adequate lighting?   yes     Depression Screen PHQ 2/9 Scores 05/30/2019 04/27/2019 04/11/2019 05/09/2018  PHQ - 2 Score 0 0 0 0  PHQ- 9 Score - - - -    Cognitive Function     6CIT Screen 05/30/2019  What Year? 4 points  What month? 0 points  What time? 0 points  Count back from 20 0 points  Months in reverse 0 points  Repeat phrase 0 points  Total Score 4    Immunization History  Administered Date(s) Administered  . Influenza Split 08/24/2011, 05/30/2012  . Influenza Whole 08/18/2005, 06/26/2010  . Influenza,inj,Quad PF,6+ Mos 06/23/2013, 06/19/2014, 07/14/2016, 09/01/2017, 05/09/2018, 05/08/2019  .  Influenza-Unspecified 05/31/2015  . Pneumococcal Conjugate-13 10/23/2014  . Pneumococcal Polysaccharide-23 02/16/2005, 05/20/2012  . Td 02/16/2005  . Tdap 02/27/2015    Qualifies for Shingles Vaccine?  Checking coverage with insurance   Screening Tests Health Maintenance  Topic Date Due  . HEMOGLOBIN A1C  10/12/2019  . OPHTHALMOLOGY EXAM  02/02/2020  . FOOT EXAM  04/26/2020  . URINE MICROALBUMIN  04/26/2020  . TETANUS/TDAP  02/26/2025  . COLONOSCOPY  03/23/2028  . INFLUENZA VACCINE  Completed  . PNEUMOCOCCAL POLYSACCHARIDE VACCINE AGE 52-64 HIGH RISK  Completed  . Hepatitis C Screening  Completed  . HIV Screening  Completed   Cancer Screenings: Lung: Low Dose CT Chest recommended if Age 70-80 years, 30 pack-year currently smoking OR have quit w/in 15years. Patient does not qualify. Colorectal:  Due 2029  Additional Screenings:   Hepatitis C Screening: Completed     Plan:      1. Encounter for Medicare annual wellness exam  I have personally reviewed and noted the following in the patient's chart:   . Medical and social history . Use of alcohol, tobacco or illicit drugs  . Current medications and supplements . Functional ability and status . Nutritional status . Physical activity . Advanced directives . List of other physicians . Hospitalizations, surgeries, and ER visits in previous 12 months . Vitals . Screenings to include cognitive, depression, and falls . Referrals and appointments  In addition, I have reviewed and discussed with patient certain preventive protocols, quality metrics, and best practice recommendations. A written personalized care plan for preventive services as well as general preventive health recommendations were provided to patient.     Perlie Mayo,  NP  05/30/2019

## 2019-05-30 NOTE — Patient Instructions (Addendum)
[name] , Thank you for taking time to come for your Medicare Wellness Visit. I appreciate your ongoing commitment to your health goals. Please review the following plan we discussed and let me know if I can assist you in the future.   Please continue to practice social distancing to keep you, your family, and our community safe.  If you must go out, please wear a Mask and practice good handwashing.   Screening recommendations/referrals: Colonoscopy: Due 2029 Recommended yearly ophthalmology/optometry visit for glaucoma screening and checkup Recommended yearly dental visit for hygiene and checkup  Vaccinations:  Influenza vaccine: Up to date Pneumococcal vaccine: Completed Tdap vaccine: Due 2026 Shingles vaccine: Check coverage insurance   Advanced directives: You reporting having the paperwork   Conditions/risks identified: Falls  Next appointment: 06/26/2019   Preventive Care 40-64 Years, Male Preventive care refers to lifestyle choices and visits with your health care provider that can promote health and wellness. What does preventive care include?  A yearly physical exam. This is also called an annual well check.  Dental exams once or twice a year.  Routine eye exams. Ask your health care provider how often you should have your eyes checked.  Personal lifestyle choices, including:  Daily care of your teeth and gums.  Regular physical activity.  Eating a healthy diet.  Avoiding tobacco and drug use.  Limiting alcohol use.  Practicing safe sex.  Taking low-dose aspirin every day starting at age 40. What happens during an annual well check? The services and screenings done by your health care provider during your annual well check will depend on your age, overall health, lifestyle risk factors, and family history of disease. Counseling  Your health care provider may ask you questions about your:  Alcohol use.  Tobacco use.  Drug use.  Emotional  well-being.  Home and relationship well-being.  Sexual activity.  Eating habits.  Work and work Statistician. Screening  You may have the following tests or measurements:  Height, weight, and BMI.  Blood pressure.  Lipid and cholesterol levels. These may be checked every 5 years, or more frequently if you are over 75 years old.  Skin check.  Lung cancer screening. You may have this screening every year starting at age 71 if you have a 30-pack-year history of smoking and currently smoke or have quit within the past 15 years.  Fecal occult blood test (FOBT) of the stool. You may have this test every year starting at age 48.  Flexible sigmoidoscopy or colonoscopy. You may have a sigmoidoscopy every 5 years or a colonoscopy every 10 years starting at age 90.  Prostate cancer screening. Recommendations will vary depending on your family history and other risks.  Hepatitis C blood test.  Hepatitis B blood test.  Sexually transmitted disease (STD) testing.  Diabetes screening. This is done by checking your blood sugar (glucose) after you have not eaten for a while (fasting). You may have this done every 1-3 years. Discuss your test results, treatment options, and if necessary, the need for more tests with your health care provider. Vaccines  Your health care provider may recommend certain vaccines, such as:  Influenza vaccine. This is recommended every year.  Tetanus, diphtheria, and acellular pertussis (Tdap, Td) vaccine. You may need a Td booster every 10 years.  Zoster vaccine. You may need this after age 60.  Pneumococcal 13-valent conjugate (PCV13) vaccine. You may need this if you have certain conditions and have not been vaccinated.  Pneumococcal polysaccharide (PPSV23)  vaccine. You may need one or two doses if you smoke cigarettes or if you have certain conditions. Talk to your health care provider about which screenings and vaccines you need and how often you need  them. This information is not intended to replace advice given to you by your health care provider. Make sure you discuss any questions you have with your health care provider. Document Released: 09/27/2015 Document Revised: 05/20/2016 Document Reviewed: 07/02/2015 Elsevier Interactive Patient Education  2017 [employer] Prevention in the Home Falls can cause injuries. They can happen to people of all ages. There are many things you can do to make your home safe and to help prevent falls. What can I do on the outside of my home?  Regularly fix the edges of walkways and driveways and fix any cracks.  Remove anything that might make you trip as you walk through a door, such as a raised step or threshold.  Trim any bushes or trees on the path to your home.  Use bright outdoor lighting.  Clear any walking paths of anything that might make someone trip, such as rocks or tools.  Regularly check to see if handrails are loose or broken. Make sure that both sides of any steps have handrails.  Any raised decks and porches should have guardrails on the edges.  Have any leaves, snow, or ice cleared regularly.  Use sand or salt on walking paths during winter.  Clean up any spills in your garage right away. This includes oil or grease spills. What can I do in the bathroom?  Use night lights.  Install grab bars by the toilet and in the tub and shower. Do not use towel bars as grab bars.  Use non-skid mats or decals in the tub or shower.  If you need to sit down in the shower, use a plastic, non-slip stool.  Keep the floor dry. Clean up any water that spills on the floor as soon as it happens.  Remove soap buildup in the tub or shower regularly.  Attach bath mats securely with double-sided non-slip rug tape.  Do not have throw rugs and other things on the floor that can make you trip. What can I do in the bedroom?  Use night lights.  Make sure that you have a light by your  bed that is easy to reach.  Do not use any sheets or blankets that are too big for your bed. They should not hang down onto the floor.  Have a firm chair that has side arms. You can use this for support while you get dressed.  Do not have throw rugs and other things on the floor that can make you trip. What can I do in the kitchen?  Clean up any spills right away.  Avoid walking on wet floors.  Keep items that you use a lot in easy-to-reach places.  If you need to reach something above you, use a strong step stool that has a grab bar.  Keep electrical cords out of the way.  Do not use floor polish or wax that makes floors slippery. If you must use wax, use non-skid floor wax.  Do not have throw rugs and other things on the floor that can make you trip. What can I do with my stairs?  Do not leave any items on the stairs.  Make sure that there are handrails on both sides of the stairs and use them. Fix handrails that are broken or  loose. Make sure that handrails are as long as the stairways.  Check any carpeting to make sure that it is firmly attached to the stairs. Fix any carpet that is loose or worn.  Avoid having throw rugs at the top or bottom of the stairs. If you do have throw rugs, attach them to the floor with carpet tape.  Make sure that you have a light switch at the top of the stairs and the bottom of the stairs. If you do not have them, ask someone to add them for you. What else can I do to help prevent falls?  Wear shoes that:  Do not have high heels.  Have rubber bottoms.  Are comfortable and fit you well.  Are closed at the toe. Do not wear sandals.  If you use a stepladder:  Make sure that it is fully opened. Do not climb a closed stepladder.  Make sure that both sides of the stepladder are locked into place.  Ask someone to hold it for you, if possible.  Clearly mark and make sure that you can see:  Any grab bars or handrails.  First and last  steps.  Where the edge of each step is.  Use tools that help you move around (mobility aids) if they are needed. These include:  Canes.  Walkers.  Scooters.  Crutches.  Turn on the lights when you go into a dark area. Replace any light bulbs as soon as they burn out.  Set up your furniture so you have a clear path. Avoid moving your furniture around.  If any of your floors are uneven, fix them.  If there are any pets around you, be aware of where they are.  Review your medicines with your doctor. Some medicines can make you feel dizzy. This can increase your chance of falling. Ask your doctor what other things that you can do to help prevent falls. This information is not intended to replace advice given to you by your health care provider. Make sure you discuss any questions you have with your health care provider. Document Released: 06/27/2009 Document Revised: 02/06/2016 Document Reviewed: 10/05/2014 Elsevier Interactive Patient Education  2017 [employer].

## 2019-06-07 ENCOUNTER — Other Ambulatory Visit: Payer: Self-pay | Admitting: Family Medicine

## 2019-06-08 ENCOUNTER — Ambulatory Visit: Payer: Medicare Other | Admitting: Family Medicine

## 2019-06-13 ENCOUNTER — Telehealth: Payer: Self-pay | Admitting: *Deleted

## 2019-06-13 NOTE — Telephone Encounter (Signed)
[name] with [facility] called said pt has a procedure with them on 07-17-19. On his chart it says pt is to premedicate before procedures. She wanted to know if Dr Moshe Cipro would be prescribing him something before procedure. She can be reached at [phone]

## 2019-06-14 ENCOUNTER — Other Ambulatory Visit: Payer: Self-pay | Admitting: Family Medicine

## 2019-06-14 MED ORDER — AMOXICILLIN 500 MG PO CAPS: ORAL_CAPSULE | ORAL | 0 refills | Status: DC

## 2019-06-14 MED ORDER — AMOXICILLIN 500 MG PO CAPS: 500.0000 mg | ORAL_CAPSULE | Freq: Three times a day (TID) | ORAL | 0 refills | Status: DC

## 2019-06-14 NOTE — Progress Notes (Signed)
amoxic

## 2019-06-14 NOTE — Telephone Encounter (Signed)
Let pt and office know 1 dose of amoxicillin has been prescribed to be taken 30 to 60 m ins before procedure Pls notify pharmacy, do not fill script sent  For 30 amoxicillin caps earlier today

## 2019-06-15 NOTE — Telephone Encounter (Signed)
Pt and facility aware

## 2019-06-26 ENCOUNTER — Encounter: Payer: Self-pay | Admitting: Family Medicine

## 2019-06-26 ENCOUNTER — Ambulatory Visit (INDEPENDENT_AMBULATORY_CARE_PROVIDER_SITE_OTHER): Payer: Medicare Other | Admitting: Family Medicine

## 2019-06-26 ENCOUNTER — Other Ambulatory Visit: Payer: Self-pay

## 2019-06-26 ENCOUNTER — Telehealth: Payer: Self-pay | Admitting: Family Medicine

## 2019-06-26 VITALS — BP 124/76 | HR 78 | Temp 98.7°F | Resp 15 | Ht 68.0 in | Wt 286.0 lb

## 2019-06-26 DIAGNOSIS — E1169 Type 2 diabetes mellitus with other specified complication: Secondary | ICD-10-CM | POA: Diagnosis not present

## 2019-06-26 DIAGNOSIS — E790 Hyperuricemia without signs of inflammatory arthritis and tophaceous disease: Secondary | ICD-10-CM

## 2019-06-26 DIAGNOSIS — E669 Obesity, unspecified: Secondary | ICD-10-CM

## 2019-06-26 DIAGNOSIS — E785 Hyperlipidemia, unspecified: Secondary | ICD-10-CM | POA: Diagnosis not present

## 2019-06-26 DIAGNOSIS — I10 Essential (primary) hypertension: Secondary | ICD-10-CM | POA: Diagnosis not present

## 2019-06-26 MED ORDER — FLUTICASONE PROPIONATE 50 MCG/ACT NA SUSP: 2.0000 | Freq: Two times a day (BID) | NASAL | 1 refills | Status: DC

## 2019-06-26 MED ORDER — ALLOPURINOL 300 MG PO TABS: 300.0000 mg | ORAL_TABLET | Freq: Every day | ORAL | 1 refills | Status: DC

## 2019-06-26 NOTE — Assessment & Plan Note (Signed)
Will have pt d/c metformin and rely on diet control only

## 2019-06-26 NOTE — Telephone Encounter (Signed)
Please call pt, let him know that I recommend stopping metformin. He takes one tablet only ,a nd his blood sugar can be controlled without this, Currently investigating metformin for adverse effects , so since on such a low dose  Needs to stop and no need to replace with a new tablet.

## 2019-06-26 NOTE — Patient Instructions (Signed)
F/U as before, call if you need me sooner  Excellent BP , no med change  Nurse will directly speak with pharmacy so lisinopril is NOT dispensed, this was stopped several months ago  Fasting lipid, cmp and EGFR, HBA1C , uric acid 1 week before January follow up  Thanks for choosing [facility], we consider it a privelige to serve you.

## 2019-06-26 NOTE — Assessment & Plan Note (Signed)
  Patient re-educated about  the importance of commitment to a  minimum of 150 minutes of exercise per week as able.  The importance of healthy food choices with portion control discussed, as well as eating regularly and within a 12 hour window most days. The need to choose "clean , green" food 50 to 75% of the time is discussed, as well as to make water the primary drink and set a goal of 64 ounces water daily.    Weight /BMI 06/26/2019 05/30/2019 04/27/2019  WEIGHT 286 lb 283 lb 1.9 oz 283 lb  HEIGHT 5\' 8"  5' 7.5" 5' 7.5"  BMI 43.49 kg/m2 43.69 kg/m2 43.67 kg/m2

## 2019-06-26 NOTE — Progress Notes (Signed)
   ONDRE SOHM     MRN: [id number]      DOB: 1955/10/07   HPI Mr. Kuzara is here for follow up and re-evaluation of chronic medical conditions,in particular hypertension , following recent medication adjustment.  Feels well has no complaints Denies leg swelling, PND, orthopnea    ROS Denies recent fever or chills. Denies sinus pressure, nasal congestion, ear pain or sore throat. Denies chest congestion, productive cough or wheezing. Denies chest pains, palpitations and leg swelling   PE  BP 124/76   Pulse 78   Temp 98.7 F (37.1 C) (Temporal)   Resp 15   Ht 5\' 8"  (1.727 m)   Wt 286 lb (129.7 kg)   SpO2 98%   BMI 43.49 kg/m   Patient alert and oriented and in no cardiopulmonary distress.  HEENT: No facial asymmetry, EOMI,     Neck supple .  Chest: Clear to auscultation bilaterally.  CVS: S1, S2 no murmurs, no S3.Regular rate.     Ext: No edema    Psych: Good eye contact, normal affect. Memory intact not anxious or depressed appearing.  CNS: CN 2-12 intact, power,  normal throughout.no focal deficits noted.   Assessment & Plan  Essential hypertension, benign Controlled, no change in medication DASH diet and commitment to daily physical activity for a minimum of 30 minutes discussed and encouraged, as a part of hypertension management. The importance of attaining a healthy weight is also discussed.  BP/Weight 06/26/2019 05/30/2019 04/27/2019 04/19/2019 04/16/2019 04/11/2019 [id number]  Systolic BP A999333 0000000 [id number] - 0000000 [id number]  Diastolic BP 76 80 80 97 - 72 100  Wt. (Lbs) 286 283.12 283 - 280 285.08 274.2  BMI 43.49 43.69 43.67 - 43.21 43.99 43.27       Morbid obesity  Patient re-educated about  the importance of commitment to a  minimum of 150 minutes of exercise per week as able.  The importance of healthy food choices with portion control discussed, as well as eating regularly and within a 12 hour window most days. The need to choose "clean , green" food 50 to  75% of the time is discussed, as well as to make water the primary drink and set a goal of 64 ounces water daily.    Weight /BMI 06/26/2019 05/30/2019 04/27/2019  WEIGHT 286 lb 283 lb 1.9 oz 283 lb  HEIGHT 5\' 8"  5' 7.5" 5' 7.5"  BMI 43.49 kg/m2 43.69 kg/m2 43.67 kg/m2      Diabetes mellitus type 2 in obese ([place]) Will have pt d/c metformin and rely on diet control only

## 2019-06-26 NOTE — Assessment & Plan Note (Signed)
Controlled, no change in medication DASH diet and commitment to daily physical activity for a minimum of 30 minutes discussed and encouraged, as a part of hypertension management. The importance of attaining a healthy weight is also discussed.  BP/Weight 06/26/2019 05/30/2019 04/27/2019 04/19/2019 04/16/2019 04/11/2019 [id number]  Systolic BP A999333 0000000 123456 [id number] - 0000000 [id number]  Diastolic BP 76 80 80 97 - 72 100  Wt. (Lbs) 286 283.12 283 - 280 285.08 274.2  BMI 43.49 43.69 43.67 - 43.21 43.99 43.27

## 2019-06-27 NOTE — Telephone Encounter (Signed)
Patient aware.

## 2019-09-01 ENCOUNTER — Other Ambulatory Visit: Payer: Self-pay | Admitting: Family Medicine

## 2019-09-01 ENCOUNTER — Other Ambulatory Visit: Payer: Self-pay

## 2019-09-01 DIAGNOSIS — R972 Elevated prostate specific antigen [PSA]: Secondary | ICD-10-CM

## 2019-09-20 LAB — HEMOGLOBIN A1C
Hgb A1c MFr Bld: 6.4 % of total Hgb — ABNORMAL HIGH (ref <5.7)
Mean Plasma Glucose: 137 (calc)
eAG (mmol/L): 7.6 (calc)

## 2019-09-20 LAB — BASIC METABOLIC PANEL WITH GFR
BUN: 10 mg/dL (ref 7–25)
CO2: 30 mmol/L (ref 20–32)
Calcium: 8.8 mg/dL (ref 8.6–10.3)
Chloride: 101 mmol/L (ref 98–110)
Creat: 0.91 mg/dL (ref 0.70–1.25)
GFR, Est African American: 104 mL/min/{1.73_m2} (ref >=60)
GFR, Est Non African American: 90 mL/min/{1.73_m2} (ref >=60)
Glucose, Bld: 123 mg/dL — ABNORMAL HIGH (ref 65–99)
Potassium: 3.5 mmol/L (ref 3.5–5.3)
Sodium: 138 mmol/L (ref 135–146)

## 2019-09-20 LAB — URIC ACID: Uric Acid, Serum: 3.8 mg/dL — ABNORMAL LOW (ref 4.0–8.0)

## 2019-10-09 ENCOUNTER — Ambulatory Visit: Payer: Medicare Other | Admitting: Family Medicine

## 2019-10-09 ENCOUNTER — Encounter: Payer: Self-pay | Admitting: Family Medicine

## 2019-10-09 ENCOUNTER — Other Ambulatory Visit: Payer: Self-pay

## 2019-10-09 VITALS — BP 120/78 | HR 74 | Temp 97.2°F | Resp 15 | Ht 67.5 in | Wt 288.8 lb

## 2019-10-09 DIAGNOSIS — I10 Essential (primary) hypertension: Secondary | ICD-10-CM

## 2019-10-09 DIAGNOSIS — R7303 Prediabetes: Secondary | ICD-10-CM | POA: Insufficient documentation

## 2019-10-09 DIAGNOSIS — E559 Vitamin D deficiency, unspecified: Secondary | ICD-10-CM

## 2019-10-09 DIAGNOSIS — E785 Hyperlipidemia, unspecified: Secondary | ICD-10-CM | POA: Diagnosis not present

## 2019-10-09 DIAGNOSIS — M5431 Sciatica, right side: Secondary | ICD-10-CM

## 2019-10-09 MED ORDER — ALLOPURINOL 300 MG PO TABS: 300.0000 mg | ORAL_TABLET | Freq: Every day | ORAL | 1 refills | Status: DC

## 2019-10-09 NOTE — Assessment & Plan Note (Signed)
  Patient re-educated about  the importance of commitment to a  minimum of 150 minutes of exercise per week as able.  The importance of healthy food choices with portion control discussed, as well as eating regularly and within a 12 hour window most days. The need to choose "clean , green" food 50 to 75% of the time is discussed, as well as to make water the primary drink and set a goal of 64 ounces water daily.    Weight /BMI 10/09/2019 06/26/2019 05/30/2019  WEIGHT 288 lb 12.8 oz 286 lb 283 lb 1.9 oz  HEIGHT 5' 7.5" 5\' 8"  5' 7.5"  BMI 44.56 kg/m2 43.49 kg/m2 43.69 kg/m2

## 2019-10-09 NOTE — Assessment & Plan Note (Signed)
No current or recent flare

## 2019-10-09 NOTE — Assessment & Plan Note (Signed)
Christopher Lambert is reminded of the importance of commitment to daily physical activity for 30 minutes or more, as able and the need to limit carbohydrate intake to 30 to 60 grams per meal to help with blood sugar control.   The need to take medication as prescribed, test blood sugar as directed, and to call between visits if there is a concern that blood sugar is uncontrolled is also discussed.   Christopher Lambert is reminded of the importance of daily foot exam, annual eye examination, and good blood sugar, blood pressure and cholesterol control.  Diabetic Labs Latest Ref Rng & Units 09/19/2019 04/27/2019 04/19/2019 04/18/2019 04/17/2019  HbA1c <5.7 % of total Hgb 6.4(H) - - - -  Microalbumin mg/dL - <0.2 - - -  Micro/Creat Ratio <30 mcg/mg creat - NOTE - - -  Chol <200 mg/dL - - - - -  HDL > OR = 40 mg/dL - - - - -  Calc LDL mg/dL (calc) - - - - -  Triglycerides <150 mg/dL - - - - -  Creatinine 0.70 - 1.25 mg/dL 0.91 0.86 0.91 1.07 1.29(H)   BP/Weight 10/09/2019 06/26/2019 05/30/2019 04/27/2019 04/19/2019 04/16/2019 [id number]  Systolic BP 123456 A999333 0000000 123456 [id number] - 0000000  Diastolic BP 78 76 80 80 97 - 72  Wt. (Lbs) 288.8 286 283.12 283 - 280 285.08  BMI 44.56 43.49 43.69 43.67 - 43.21 43.99   Foot/eye exam completion dates Latest Ref Rng & Units 04/27/2019 02/02/2019  Eye Exam No Retinopathy - No Retinopathy  Foot Form Completion - Done -

## 2019-10-09 NOTE — Assessment & Plan Note (Signed)
Hyperlipidemia:Low fat diet discussed and encouraged.   Lipid Panel  Lab Results  Component Value Date   CHOL 154 04/11/2019   HDL 39 (L) 04/11/2019   LDLCALC 90 04/11/2019   TRIG 146 04/11/2019   CHOLHDL 3.9 04/11/2019   Need to increase exercise  Updated lab needed at/ before next visit.

## 2019-10-09 NOTE — Patient Instructions (Addendum)
F/U in office with MD in 5 months, call if you need me sooner  Please get your f/u appointment with Dr Jeffie Pollock  Scheduled when you leave, if you need help with this, let us know  Fasting lipid, cmp and eGFr and hBa1C and vit D 1 week before visit, call if you need me sooner  It is important that you exercise regularly at least 30 minutes 5 times a week. If you develop chest pain, have severe difficulty breathing, or feel very tired, stop exercising immediately and seek medical attention   Think about what you will eat, plan ahead. Choose " clean, green, fresh or frozen" over canned, processed or packaged foods which are more sugary, salty and fatty. 70 to 75% of food eaten should be vegetables and fruit. Three meals at set times with snacks allowed between meals, but they must be fruit or vegetables. Aim to eat over a 12 hour period , example 7 am to 7 pm, and STOP after  your last meal of the day. Drink water,generally about 64 ounces per day, no other drink is as healthy. Fruit juice is best enjoyed in a healthy way, by EATING the fruit.

## 2019-10-09 NOTE — Assessment & Plan Note (Signed)
Patient educated about the importance of limiting  Carbohydrate intake , the need to commit to daily physical activity for a minimum of 30 minutes , and to commit weight loss. The fact that changes in all these areas will reduce or eliminate all together the development of diabetes is stressed.   Diabetic Labs Latest Ref Rng & Units 09/19/2019 04/27/2019 04/19/2019 04/18/2019 04/17/2019  HbA1c <5.7 % of total Hgb 6.4(H) - - - -  Microalbumin mg/dL - <0.2 - - -  Micro/Creat Ratio <30 mcg/mg creat - NOTE - - -  Chol <200 mg/dL - - - - -  HDL > OR = 40 mg/dL - - - - -  Calc LDL mg/dL (calc) - - - - -  Triglycerides <150 mg/dL - - - - -  Creatinine 0.70 - 1.25 mg/dL 0.91 0.86 0.91 1.07 1.29(H)   BP/Weight 10/09/2019 06/26/2019 05/30/2019 04/27/2019 04/19/2019 04/16/2019 [id number]  Systolic BP 123456 A999333 0000000 123456 [id number] - 0000000  Diastolic BP 78 76 80 80 97 - 72  Wt. (Lbs) 288.8 286 283.12 283 - 280 285.08  BMI 44.56 43.49 43.69 43.67 - 43.21 43.99   Foot/eye exam completion dates Latest Ref Rng & Units 04/27/2019 02/02/2019  Eye Exam No Retinopathy - No Retinopathy  Foot Form Completion - Done -

## 2019-10-09 NOTE — Progress Notes (Signed)
Christopher Lambert     MRN: [id number]      DOB: 10-18-1955   HPI Christopher Lambert is here for follow up and re-evaluation of chronic medical conditions, medication management and review of any available recent lab and radiology data.  Preventive health is updated, specifically  Cancer screening and Immunization.   Questions or concerns regarding consultations or procedures which the PT has had in the interim are  addressed. The PT denies any adverse reactions to current medications since the last visit.  There are no new concerns.  There are no specific complaints   ROS Denies recent fever or chills. Denies sinus pressure, nasal congestion, ear pain or sore throat. Denies chest congestion, productive cough or wheezing. Denies chest pains, palpitations and leg swelling Denies abdominal pain, nausea, vomiting,diarrhea or constipation.   Denies dysuria, frequency, hesitancy or incontinence. Denies joint pain, swelling and limitation in mobility. Denies headaches, seizures, numbness, or tingling. Denies depression, anxiety or insomnia. Denies skin break down or rash.   PE  BP 120/78   Pulse 74   Temp (!) 97.2 F (36.2 C) (Temporal)   Resp 15   Ht 5' 7.5" (1.715 m)   Wt 288 lb 12.8 oz (131 kg)   SpO2 97%   BMI 44.56 kg/m    Patient alert and oriented and in no cardiopulmonary distress.  HEENT: No facial asymmetry, EOMI,     Neck supple .  Chest: Clear to auscultation bilaterally.  CVS: S1, S2 no murmurs, no S3.Regular rate.  ABD: Soft non tender.   Ext: No edema  MS: Adequate ROM spine, shoulders, hips and reduced in  knees.  Skin: Intact, no ulcerations or rash noted.  Psych: Good eye contact, normal affect. Memory intact not anxious or depressed appearing.  CNS: CN 2-12 intact, power,  normal throughout.no focal deficits noted.   Assessment & Plan  Essential hypertension, benign Controlled, no change in medication DASH diet and commitment to daily physical activity  for a minimum of 30 minutes discussed and encouraged, as a part of hypertension management. The importance of attaining a healthy weight is also discussed.  BP/Weight 10/09/2019 06/26/2019 05/30/2019 04/27/2019 04/19/2019 04/16/2019 [id number]  Systolic BP 123456 A999333 0000000 123456 [id number] - 0000000  Diastolic BP 78 76 80 80 97 - 72  Wt. (Lbs) 288.8 286 283.12 283 - 280 285.08  BMI 44.56 43.49 43.69 43.67 - 43.21 43.99       Dyslipidemia, goal LDL below 100 Hyperlipidemia:Low fat diet discussed and encouraged.   Lipid Panel  Lab Results  Component Value Date   CHOL 154 04/11/2019   HDL 39 (L) 04/11/2019   LDLCALC 90 04/11/2019   TRIG 146 04/11/2019   CHOLHDL 3.9 04/11/2019   Need to increase exercise  Updated lab needed at/ before next visit.    DM type 2 with diabetic dyslipidemia [facility]) Christopher Lambert is reminded of the importance of commitment to daily physical activity for 30 minutes or more, as able and the need to limit carbohydrate intake to 30 to 60 grams per meal to help with blood sugar control.   The need to take medication as prescribed, test blood sugar as directed, and to call between visits if there is a concern that blood sugar is uncontrolled is also discussed.   Christopher Lambert is reminded of the importance of daily foot exam, annual eye examination, and good blood sugar, blood pressure and cholesterol control.  Diabetic Labs Latest Ref Rng & Units 09/19/2019 04/27/2019 04/19/2019 04/18/2019  04/17/2019  HbA1c <5.7 % of total Hgb 6.4(H) - - - -  Microalbumin mg/dL - <0.2 - - -  Micro/Creat Ratio <30 mcg/mg creat - NOTE - - -  Chol <200 mg/dL - - - - -  HDL > OR = 40 mg/dL - - - - -  Calc LDL mg/dL (calc) - - - - -  Triglycerides <150 mg/dL - - - - -  Creatinine 0.70 - 1.25 mg/dL 0.91 0.86 0.91 1.07 1.29(H)   BP/Weight 10/09/2019 06/26/2019 05/30/2019 04/27/2019 04/19/2019 04/16/2019 [id number]  Systolic BP 123456 A999333 0000000 123456 [id number] - 0000000  Diastolic BP 78 76 80 80 97 - 72  Wt. (Lbs) 288.8 286 283.12 283 - 280  285.08  BMI 44.56 43.49 43.69 43.67 - 43.21 43.99   Foot/eye exam completion dates Latest Ref Rng & Units 04/27/2019 02/02/2019  Eye Exam No Retinopathy - No Retinopathy  Foot Form Completion - Done -        Morbid obesity  Patient re-educated about  the importance of commitment to a  minimum of 150 minutes of exercise per week as able.  The importance of healthy food choices with portion control discussed, as well as eating regularly and within a 12 hour window most days. The need to choose "clean , green" food 50 to 75% of the time is discussed, as well as to make water the primary drink and set a goal of 64 ounces water daily.    Weight /BMI 10/09/2019 06/26/2019 05/30/2019  WEIGHT 288 lb 12.8 oz 286 lb 283 lb 1.9 oz  HEIGHT 5' 7.5" 5\' 8"  5' 7.5"  BMI 44.56 kg/m2 43.49 kg/m2 43.69 kg/m2      Prediabetes Patient educated about the importance of limiting  Carbohydrate intake , the need to commit to daily physical activity for a minimum of 30 minutes , and to commit weight loss. The fact that changes in all these areas will reduce or eliminate all together the development of diabetes is stressed.   Diabetic Labs Latest Ref Rng & Units 09/19/2019 04/27/2019 04/19/2019 04/18/2019 04/17/2019  HbA1c <5.7 % of total Hgb 6.4(H) - - - -  Microalbumin mg/dL - <0.2 - - -  Micro/Creat Ratio <30 mcg/mg creat - NOTE - - -  Chol <200 mg/dL - - - - -  HDL > OR = 40 mg/dL - - - - -  Calc LDL mg/dL (calc) - - - - -  Triglycerides <150 mg/dL - - - - -  Creatinine 0.70 - 1.25 mg/dL 0.91 0.86 0.91 1.07 1.29(H)   BP/Weight 10/09/2019 06/26/2019 05/30/2019 04/27/2019 04/19/2019 04/16/2019 Q000111Q  Systolic BP 123456 A999333 0000000 123456 [id number] - 0000000  Diastolic BP 78 76 80 80 97 - 72  Wt. (Lbs) 288.8 286 283.12 283 - 280 285.08  BMI 44.56 43.49 43.69 43.67 - 43.21 43.99   Foot/eye exam completion dates Latest Ref Rng & Units 04/27/2019 02/02/2019  Eye Exam No Retinopathy - No Retinopathy  Foot Form Completion - Done -       Sciatica, right side No current or recent flare

## 2019-10-09 NOTE — Assessment & Plan Note (Signed)
Controlled, no change in medication DASH diet and commitment to daily physical activity for a minimum of 30 minutes discussed and encouraged, as a part of hypertension management. The importance of attaining a healthy weight is also discussed.  BP/Weight 10/09/2019 06/26/2019 05/30/2019 04/27/2019 04/19/2019 04/16/2019 Q000111Q  Systolic BP 123456 A999333 0000000 123456 [id number] - 0000000  Diastolic BP 78 76 80 80 97 - 72  Wt. (Lbs) 288.8 286 283.12 283 - 280 285.08  BMI 44.56 43.49 43.69 43.67 - 43.21 43.99

## 2019-10-12 ENCOUNTER — Ambulatory Visit: Payer: Medicare Other | Attending: Internal Medicine

## 2019-10-12 ENCOUNTER — Other Ambulatory Visit: Payer: Self-pay

## 2019-10-12 ENCOUNTER — Encounter: Payer: Self-pay | Admitting: Family Medicine

## 2019-10-12 DIAGNOSIS — Z20822 Contact with and (suspected) exposure to covid-19: Secondary | ICD-10-CM

## 2019-10-13 ENCOUNTER — Other Ambulatory Visit: Payer: Medicare Other

## 2019-10-13 LAB — NOVEL CORONAVIRUS, NAA: SARS-CoV-2, NAA: NOT DETECTED

## 2019-10-27 ENCOUNTER — Encounter: Payer: Self-pay | Admitting: Internal Medicine

## 2019-11-04 ENCOUNTER — Encounter (HOSPITAL_COMMUNITY): Payer: Self-pay | Admitting: Emergency Medicine

## 2019-11-04 ENCOUNTER — Emergency Department (HOSPITAL_COMMUNITY)
Admission: EM | Admit: 2019-11-04 | Discharge: 2019-11-04 | Disposition: A | Discharge status: Home / Self Care | Payer: Medicare Other | Attending: Emergency Medicine | Admitting: Emergency Medicine

## 2019-11-04 ENCOUNTER — Emergency Department (HOSPITAL_COMMUNITY): Payer: Medicare Other

## 2019-11-04 ENCOUNTER — Other Ambulatory Visit: Payer: Self-pay

## 2019-11-04 DIAGNOSIS — Z7982 Long term (current) use of aspirin: Secondary | ICD-10-CM | POA: Diagnosis not present

## 2019-11-04 DIAGNOSIS — F17210 Nicotine dependence, cigarettes, uncomplicated: Secondary | ICD-10-CM | POA: Diagnosis not present

## 2019-11-04 DIAGNOSIS — Z79899 Other long term (current) drug therapy: Secondary | ICD-10-CM | POA: Insufficient documentation

## 2019-11-04 DIAGNOSIS — L03115 Cellulitis of right lower limb: Secondary | ICD-10-CM | POA: Diagnosis not present

## 2019-11-04 DIAGNOSIS — I10 Essential (primary) hypertension: Secondary | ICD-10-CM | POA: Insufficient documentation

## 2019-11-04 DIAGNOSIS — R52 Pain, unspecified: Secondary | ICD-10-CM

## 2019-11-04 DIAGNOSIS — R2241 Localized swelling, mass and lump, right lower limb: Secondary | ICD-10-CM | POA: Diagnosis present

## 2019-11-04 LAB — BASIC METABOLIC PANEL
Anion gap: 9 (ref 5–15)
BUN: 20 mg/dL (ref 8–23)
CO2: 27 mmol/L (ref 22–32)
Calcium: 8.3 mg/dL — ABNORMAL LOW (ref 8.9–10.3)
Chloride: 101 mmol/L (ref 98–111)
Creatinine, Ser: 1.21 mg/dL (ref 0.61–1.24)
GFR calc Af Amer: >60 mL/min (ref >60)
GFR calc non Af Amer: >60 mL/min (ref >60)
Glucose, Bld: 131 mg/dL — ABNORMAL HIGH (ref 70–99)
Potassium: 3.1 mmol/L — ABNORMAL LOW (ref 3.5–5.1)
Sodium: 137 mmol/L (ref 135–145)

## 2019-11-04 LAB — CBC WITH DIFFERENTIAL/PLATELET
Abs Immature Granulocytes: 0.13 10*3/uL — ABNORMAL HIGH (ref 0.00–0.07)
Basophils Absolute: 0 10*3/uL (ref 0.0–0.1)
Basophils Relative: 0 %
Eosinophils Absolute: 0 10*3/uL (ref 0.0–0.5)
Eosinophils Relative: 0 %
HCT: 39.9 % (ref 39.0–52.0)
Hemoglobin: 13.9 g/dL (ref 13.0–17.0)
Immature Granulocytes: 1 %
Lymphocytes Relative: 4 %
Lymphs Abs: 0.5 10*3/uL — ABNORMAL LOW (ref 0.7–4.0)
MCH: 31.8 pg (ref 26.0–34.0)
MCHC: 34.8 g/dL (ref 30.0–36.0)
MCV: 91.3 fL (ref 80.0–100.0)
Monocytes Absolute: 0.4 10*3/uL (ref 0.1–1.0)
Monocytes Relative: 3 %
Neutro Abs: 11.4 10*3/uL — ABNORMAL HIGH (ref 1.7–7.7)
Neutrophils Relative %: 92 %
Platelets: 177 10*3/uL (ref 150–400)
RBC: 4.37 MIL/uL (ref 4.22–5.81)
RDW: 13.6 % (ref 11.5–15.5)
WBC: 12.5 10*3/uL — ABNORMAL HIGH (ref 4.0–10.5)
nRBC: 0 % (ref 0.0–0.2)

## 2019-11-04 MED ORDER — SODIUM CHLORIDE 0.9 % IV BOLUS
1000.0000 mL | Freq: Once | INTRAVENOUS | Status: AC
  Administered 2019-11-04: 1000 mL via INTRAVENOUS

## 2019-11-04 MED ORDER — POTASSIUM CHLORIDE CRYS ER 20 MEQ PO TBCR
40.0000 meq | EXTENDED_RELEASE_TABLET | Freq: Once | ORAL | Status: AC
  Administered 2019-11-04: 40 meq via ORAL
  Filled 2019-11-04: qty 2

## 2019-11-04 MED ORDER — SODIUM CHLORIDE 0.9 % IV SOLN
1.0000 g | Freq: Once | INTRAVENOUS | Status: AC
  Administered 2019-11-04: 1 g via INTRAVENOUS
  Filled 2019-11-04: qty 10

## 2019-11-04 MED ORDER — DOXYCYCLINE HYCLATE 100 MG PO TABS: 100.0000 mg | ORAL_TABLET | Freq: Two times a day (BID) | ORAL | 0 refills | Status: DC

## 2019-11-04 MED ORDER — VANCOMYCIN HCL IN DEXTROSE 1-5 GM/200ML-% IV SOLN
1000.0000 mg | Freq: Once | INTRAVENOUS | Status: AC
  Administered 2019-11-04: 1000 mg via INTRAVENOUS
  Filled 2019-11-04: qty 200

## 2019-11-04 NOTE — Consult Note (Signed)
[facility] Consult Note  Christopher Lambert [id number]  Patients out patient PCP is Christopher Helper, MD Consult requested in the Hospital by Christopher Ferguson, MD, On 11/04/2019   Reason for consult: Evaluation and management recommendations for cellulitis of the right lower extremity  Past Medical History:  Diagnosis Date  . Arthritis of knee    Bilateral  . 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 ED (erectile dysfunction)   . Essential hypertension, benign 1990  . H/O: whooping cough    [place]  . Hemorrhoids, internal   . Obesity   . Obstructive sleep apnea 11/09/2007   NPSG 08/26/04 AHI 92/hr, [place], [place] NPSG 01/31/15- severe OSA AHI 49.4/ hr, CPAP suggested 13, AHI 0.0, weight 294 lbs CPAP Advanced    . Seasonal allergies   . Sleep apnea 2004   wears CPAP nightly       Past Surgical History:  Procedure Laterality Date  . CARDIAC CATHETERIZATION  09/25/2011  . CARPAL TUNNEL RELEASE     Right   . COLONOSCOPY N/A 03/23/2018   Procedure: COLONOSCOPY;  Surgeon: Daneil Dolin, MD;  [facility];  Service: Endoscopy;  Laterality: N/A;  8:00  . HERNIA REPAIR  [id number] approx   umbilical  . JOINT REPLACEMENT  2011   right knee  . JOINT REPLACEMENT  2013   left knee  . left knee replacement   sept 2013  . POLYPECTOMY  03/23/2018   Procedure: POLYPECTOMY;  Surgeon: Daneil Dolin, MD;  [facility];  Service: Endoscopy;;  colon  . Right knee replacement  06/2010  . TOTAL KNEE ARTHROPLASTY  05/18/2012   Procedure: TOTAL KNEE ARTHROPLASTY;  Surgeon: Ninetta Lights, MD;  Location: [place];  Service: Orthopedics;  Laterality: Left;  left total knee arthroplasty  . UMBILICAL HERNIA REPAIR      Past Surgical History:  Procedure Laterality Date  . CARDIAC CATHETERIZATION  09/25/2011  . CARPAL TUNNEL RELEASE     Right   . COLONOSCOPY N/A 03/23/2018   Procedure: COLONOSCOPY;  Surgeon: Daneil Dolin, MD;  [facility];  Service: Endoscopy;  Laterality: N/A;  8:00  . HERNIA REPAIR  123456 approx   umbilical  . JOINT REPLACEMENT  2011   right knee  . JOINT REPLACEMENT  2013   left knee  . left knee replacement   sept 2013  . POLYPECTOMY  03/23/2018   Procedure: POLYPECTOMY;  Surgeon: Daneil Dolin, MD;  [facility];  Service: Endoscopy;;  colon  . Right knee replacement  06/2010  . TOTAL KNEE ARTHROPLASTY  05/18/2012   Procedure: TOTAL KNEE ARTHROPLASTY;  Surgeon: Ninetta Lights, MD;  Location: [place];  Service: Orthopedics;  Laterality: Left;  left total knee arthroplasty  . UMBILICAL HERNIA REPAIR      HPI:-  Christopher Lambert [id number] is a 64 y.o. male with type 2 diabetes mellitus and OSA presented to the emergency department because the for the past few days he has had increasing swelling and redness in the right groin and leg area.  He has had some edema in the area.  He has had no drainage.  He has had no knots or abscesses develop.  He tried to get in with his PCP but the office was closed.  The patient denies fever and chills.  He denies nausea vomiting.  He has been eating and drinking well.  Patient was seen  in the emergency department and noted to be afebrile.  BP 156/96, pulse 82.  Saturating 99% on room air.  Patient was noted to have potassium of 3.1.  WBC 12.5.  Glucose 131.  He had venous Dopplers of the right lower extremity but no findings of DVT.  Patient was given IV ceftriaxone and vancomycin.  Hospitalist was called for consultation.  Review of Systems   Symptoms as noted in the HPI above otherwise all reviewed and reported as negative.  Social History Social History   Tobacco Use  . Smoking status: Current Every Day Smoker    Packs/day: 0.50    Types: Cigarettes  . Smokeless tobacco: Never Used  Substance Use Topics  . Alcohol use: No     Family History Family History  Problem Relation Age of Onset  . Coronary artery disease Brother 48        Premature  . Hypertension Brother   . Hypertension Father   . Cancer Father 31       colon   . Hypertension Mother   . Diabetes Brother   . Hypertension Sister   . Hypertension Sister   . Hypertension Sister   . Hypertension Sister      Prior to Admission medications   Medication Sig Start Date End Date Taking? Authorizing Provider  allopurinol (ZYLOPRIM) 300 MG tablet Take 1 tablet (300 mg total) by mouth daily. 10/13/19   Christopher Helper, MD  aspirin 81 MG EC tablet TAKE 1 TABLET EVERY DAY Patient taking differently: Take 81 mg by mouth daily.  02/12/14   Christopher Helper, MD  cholecalciferol (VITAMIN D) 1000 UNITS tablet Take 1,000 Units by mouth daily.      [provider]  ELDERBERRY PO Take 1 tablet by mouth daily.    [provider]  fluticasone (FLONASE) 50 MCG/ACT nasal spray Place 2 sprays into both nostrils 2 (two) times daily. 06/26/19   Christopher Helper, MD  Multiple Vitamin (MULTIVITAMIN WITH MINERALS) TABS tablet Take 1 tablet by mouth daily.    [provider]  NIFEdipine (PROCARDIA XL/NIFEDICAL-XL) 90 MG 24 hr tablet TAKE 1 TABLET BY MOUTH EVERY DAY 09/01/19   Christopher Helper, MD  simvastatin (ZOCOR) 5 MG tablet TAKE 1 TABLET BY MOUTH EVERYDAY AT BEDTIME Patient taking differently: Take 5 mg by mouth every morning.  03/19/19   Perlie Mayo, NP    No Known Allergies  Physical Exam No intake or output data in the 24 hours ending 11/04/19 1211 Blood pressure 116/68, pulse 81, temperature 97.8 F (36.6 C), temperature source Oral, resp. rate 16, height 5\' 6"  (1.676 m), weight 74.8 kg, SpO2 96 %.  BP 116/68   Pulse 81   Temp 97.8 F (36.6 C) (Oral)   Resp 16   Ht 5\' 6"  (1.676 m)   Wt 74.8 kg   SpO2 96%   BMI 26.63 kg/m   General Appearance:    Alert, cooperative, no distress, appears stated age  Head:    Normocephalic, without obvious abnormality, atraumatic  Eyes:    PERRL, conjunctiva/corneas clear, EOM's intact,  fundi    benign, both eyes          Nose:   Nares normal, septum midline, mucosa normal, no drainage    or sinus tenderness  Throat:   Lips, mucosa, and tongue normal  Neck:   Supple, symmetrical, trachea midline, no adenopathy;       thyroid:  No enlargement/tenderness/nodules; no carotid  bruit or JVD     Lungs:     Clear to auscultation bilaterally, respirations unlabored  Chest wall:    No tenderness or deformity  Heart:    Regular rate and rhythm, S1 and S2 normal, no murmur, rub   or gallop  Abdomen:     Soft, non-tender, bowel sounds active all four quadrants,    no masses, no organomegaly  Genitalia:    Normal male without lesion, discharge or tenderness  Rectal:   Deferred  Extremities:  Right lower extremity with erythema warmth and redness of the skin in the right upper thigh area with mild surrounding edema consistent with mild nonpurulent cellulitis.  Mild right inguinal lymphadenopathy.  Normal left lower extremity.  Pulses:   2+ and symmetric all extremities  Skin:   Skin color, texture, turgor normal, no rashes or lesions  Lymph nodes:  Mild right inguinal lymphadenopathy.  Neurologic:   CNII-XII intact. Normal strength, sensation and reflexes      throughout   Data Review CBC w Diff:  Lab Results  Component Value Date   WBC 12.5 (H) 11/04/2019   HGB 13.9 11/04/2019   HCT 39.9 11/04/2019   PLT 177 11/04/2019   LYMPHOPCT 4 11/04/2019   MONOPCT 3 11/04/2019   EOSPCT 0 11/04/2019   BASOPCT 0 11/04/2019    CMP:  Lab Results  Component Value Date   NA 137 11/04/2019   K 3.1 (L) 11/04/2019   CL 101 11/04/2019   CO2 27 11/04/2019   BUN 20 11/04/2019   CREATININE 1.21 11/04/2019   CREATININE 0.91 09/19/2019   PROT 7.3 04/16/2019   ALBUMIN 3.5 04/16/2019   BILITOT 2.1 (H) 04/16/2019   ALKPHOS 83 04/16/2019   AST 25 04/16/2019   ALT 30 04/16/2019    Coagulation:  Lab Results  Component Value Date   INR 1.43 05/20/2012   Assessment & Plan  1.  Mild  nonpurulent cellulitis right lower extremity-patient has no sepsis physiology, mild bump in WBC, eating and drinking well with no nausea or vomiting.  He is receiving IV ceftriaxone and vancomycin ordered in the emergency department.  He does not require hospitalization at this time.  I recommend discharging home on oral antibiotic therapy to start 11/05/2019.  Close follow-up with primary care physician Dr. Tula Nakayama advised.  He should also follow-up with his orthopedic specialist who performed his knee replacement surgeries.  Patient verbalizes understanding.  Patient was advised to return if symptoms worsen or do not improve or if new problems develop.  He  verbalizes understanding.  2. Type 2 DM - stable, resume home treatments, monitor BS more closely while being treated for the active infection.  Thank you for the consult.   Esiah Bazinet James Ivanoff, MD How to contact the [facility] Attending or Consulting provider [place] or covering provider during after hours [place], for this patient?  1. Check the care team in [facility] and look for a) attending/consulting [facility] provider listed and b) the [facility] team listed 2. Log into [email or web address] and use [place]'s universal password to access. If you do not have the password, please contact the hospital operator. 3. Locate the [facility] provider you are looking for under [facility] and page to a number that you can be directly reached. 4. If you still have difficulty reaching the provider, please page the [facility] (Director on Call) for the Hospitalists listed on amion for assistance.

## 2019-11-04 NOTE — ED Provider Notes (Signed)
[facility] EMERGENCY DEPARTMENT Provider Note   CSN: [id number] Arrival date & time: 11/04/19  [id number]     History Chief Complaint  Patient presents with  . Leg Swelling    Christopher Lambert is a 64 y.o. male.  The history is provided by the patient. No language interpreter was used.  Leg Pain Location:  Leg Time since incident:  2 weeks Injury: no   Leg location:  R leg and R lower leg Pain details:    Quality:  Aching   Radiates to:  Does not radiate   Severity:  Moderate   Onset quality:  Gradual   Duration:  2 weeks   Timing:  Constant   Progression:  Worsening Chronicity:  New Prior injury to area:  No Relieved by:  Nothing Worsened by:  Nothing Ineffective treatments:  None tried Risk factors: frequent fractures   Pt reports leg is swollen and painful;     Past Medical History:  Diagnosis Date  . Arthritis of knee    Bilateral  . 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 ED (erectile dysfunction)   . Essential hypertension, benign 1990  . H/O: whooping cough    aschild  . Hemorrhoids, internal   . Obesity   . Obstructive sleep apnea 11/09/2007   NPSG 08/26/04 AHI 92/hr, [place], [place] NPSG 01/31/15- severe OSA AHI 49.4/ hr, CPAP suggested 13, AHI 0.0, weight 294 lbs CPAP Advanced    . Seasonal allergies   . Sleep apnea 2004   wears CPAP nightly      Patient Active Problem List   Diagnosis Date Noted  . Prediabetes 10/09/2019  . Sciatica, right side 11/12/2016  . Piles (hemorrhoids) 06/19/2014  . Dyslipidemia, goal LDL below 100 06/19/2014  . Metabolic syndrome 123456  . Abnormal cardiovascular function study 09/21/2011  . Palpitations 08/24/2011  . Morbid obesity ([place]) 12/02/2007  . Essential hypertension, benign 11/09/2007  . Seasonal allergic rhinitis 11/09/2007    Past Surgical History:  Procedure Laterality Date  . CARDIAC CATHETERIZATION  09/25/2011  . CARPAL TUNNEL RELEASE     Right   . COLONOSCOPY N/A 03/23/2018   Procedure: COLONOSCOPY;  Surgeon: Daneil Dolin, MD;  Location: [facility];  Service: Endoscopy;  Laterality: N/A;  8:00  . HERNIA REPAIR  [id number] approx   umbilical  . JOINT REPLACEMENT  2011   right knee  . JOINT REPLACEMENT  2013   left knee  . left knee replacement   sept 2013  . POLYPECTOMY  03/23/2018   Procedure: POLYPECTOMY;  Surgeon: Daneil Dolin, MD;  Location: [facility];  Service: Endoscopy;;  colon  . Right knee replacement  06/2010  . TOTAL KNEE ARTHROPLASTY  05/18/2012   Procedure: TOTAL KNEE ARTHROPLASTY;  Surgeon: Ninetta Lights, MD;  Location: [place];  Service: Orthopedics;  Laterality: Left;  left total knee arthroplasty  . UMBILICAL HERNIA REPAIR         Family History  Problem Relation Age of Onset  . Coronary artery disease Brother 48       Premature  . Hypertension Brother   . Hypertension Father   . Cancer Father 64       colon   . Hypertension Mother   . Diabetes Brother   . Hypertension Sister   . Hypertension Sister   . Hypertension Sister   . Hypertension Sister     Social History   Tobacco Use  .  Smoking status: Current Every Day Smoker    Packs/day: 0.50    Types: Cigarettes  . Smokeless tobacco: Never Used  Substance Use Topics  . Alcohol use: No  . Drug use: No    Home Medications Prior to Admission medications   Medication Sig Start Date End Date Taking? Authorizing Provider  allopurinol (ZYLOPRIM) 300 MG tablet Take 1 tablet (300 mg total) by mouth daily. 10/13/19   [name], MD  aspirin 81 MG EC tablet TAKE 1 TABLET EVERY DAY Patient taking differently: Take 81 mg by mouth daily.  02/12/14   [name], MD  cholecalciferol (VITAMIN D) 1000 UNITS tablet Take 1,000 Units by mouth daily.      [provider]  ELDERBERRY PO Take 1 tablet by mouth daily.    [provider]  fluticasone (FLONASE) 50 MCG/ACT nasal spray Place 2 sprays into both nostrils 2 (two) times daily. 06/26/19   [name], MD  Multiple Vitamin (MULTIVITAMIN WITH MINERALS) TABS tablet Take 1 tablet by mouth daily.    [provider]  NIFEdipine (PROCARDIA XL/NIFEDICAL-XL) 90 MG 24 hr tablet TAKE 1 TABLET BY MOUTH EVERY DAY 09/01/19   [name], MD  simvastatin (ZOCOR) 5 MG tablet TAKE 1 TABLET BY MOUTH EVERYDAY AT BEDTIME Patient taking differently: Take 5 mg by mouth every morning.  03/19/19   Perlie Mayo, NP    Allergies    Patient has no known allergies.  Review of Systems   Review of Systems  All other systems reviewed and are negative.   Physical Exam Updated Vital Signs BP 116/68   Pulse 81   Temp 97.8 F (36.6 C) (Oral)   Resp 16   Ht 5\' 6"  (1.676 m)   Wt 74.8 kg   SpO2 96%   BMI 26.63 kg/m   Physical Exam Vitals and nursing note reviewed.  Constitutional:      Appearance: He is well-developed.  HENT:     Head: Normocephalic and atraumatic.  Eyes:     Conjunctiva/sclera: Conjunctivae normal.  Cardiovascular:     Rate and Rhythm: Normal rate and regular rhythm.     Heart sounds: No murmur.  Pulmonary:     Effort: Pulmonary effort is normal. No respiratory distress.     Breath sounds: Normal breath sounds.  Abdominal:     Palpations: Abdomen is soft.     Tenderness: There is no abdominal tenderness.  Musculoskeletal:        General: Swelling and tenderness present.     Cervical back: Neck supple.     Comments: diffusely tender right upper leg. Erythematous   Skin:    General: Skin is warm and dry.  Neurological:     Mental Status: He is alert.     ED Results / Procedures / Treatments   Labs (all labs ordered are listed, but only abnormal results are displayed) Labs Reviewed  CBC WITH DIFFERENTIAL/PLATELET - Abnormal; Notable for the following components:      Result Value   WBC 12.5 (*)    Neutro Abs 11.4 (*)    Lymphs Abs 0.5 (*)    Abs Immature Granulocytes 0.13 (*)    All other components within normal limits  BASIC METABOLIC  PANEL - Abnormal; Notable for the following components:   Potassium 3.1 (*)    Glucose, Bld 131 (*)    Calcium 8.3 (*)    All other components within normal limits    EKG  None  Radiology US Venous Img Lower Unilateral Right (DVT)  Result Date: 11/04/2019 CLINICAL DATA:  Right lower extremity pain and swelling. EXAM: RIGHT LOWER EXTREMITY VENOUS DOPPLER ULTRASOUND TECHNIQUE: Gray-scale sonography with compression, as well as color and duplex ultrasound, were performed to evaluate the deep venous system(s) from the level of the common femoral vein through the popliteal and proximal calf veins. COMPARISON:  CT abdomen/pelvis 04/17/2019 FINDINGS: VENOUS Normal compressibility of the common femoral, superficial femoral, and popliteal veins, as well as the visualized calf veins. Visualized portions of profunda femoral vein and great saphenous vein unremarkable. No filling defects to suggest DVT on grayscale or color Doppler imaging. Doppler waveforms show normal direction of venous flow, normal respiratory phasicity and response to augmentation. Limited views of the contralateral common femoral vein are unremarkable. OTHER Prominent right inguinal lymph node measuring 6 cm in greatest diameter with cortical thickness of 6 mm. Limitations: none IMPRESSION: 1. No femoropopliteal DVT nor evidence of DVT within the visualized calf veins. If clinical symptoms are inconsistent or if there are persistent or worsening symptoms, further imaging (possibly involving the iliac veins) may be warranted. 2. Right inguinal adenopathy with largest node measuring 6 cm having cortical thickness of 6 mm. Recommend clinical correlation. Electronically Signed   By: Marin Olp M.D.   On: 11/04/2019 10:26    Procedures Procedures (including critical care time)  Medications Ordered in ED Medications  cefTRIAXone (ROCEPHIN) 1 g in sodium chloride 0.9 % 100 mL IVPB (1 g Intravenous New Bag/Given 11/04/19 1205)  vancomycin  (VANCOCIN) IVPB 1000 mg/200 mL premix (has no administration in time range)  sodium chloride 0.9 % bolus 1,000 mL (1,000 mLs Intravenous New Bag/Given 11/04/19 1204)    ED Course  I have reviewed the triage vital signs and the nursing notes.  Pertinent labs & imaging results that were available during my care of the patient were reviewed by me and considered in my medical decision making (see chart for details).    MDM Rules/Calculators/A&P                      MDM:  Doppler shows no evidence of dvt.  Pt given rocephina dn vancomycin.   Hospitalist evaluated pt and feels like he can be treated outpt.  Pt given rx for doxycycline.  Pt advised to see Dr. Moshe Cipro for recheck this week  Final Clinical Impression(s) / ED Diagnoses Final diagnoses:  Cellulitis of right leg    Rx / DC Orders ED Discharge Orders         Ordered    doxycycline (VIBRA-TABS) 100 MG tablet  2 times daily     11/04/19 1219        An After Visit Summary was printed and given to the patient.    Sidney Ace 11/04/19 Watson, MD 11/05/19 [phone]

## 2019-11-04 NOTE — ED Triage Notes (Addendum)
Pt c/o pain in right groin x 2 weeks with swelling in right groin and right lower leg since Thursday. Started running temp yesterday and took Tylenol at 0530 this morning. No hx of DVT.

## 2019-11-04 NOTE — Discharge Instructions (Signed)
Return if any problems.  See Dr. Moshe Cipro next week for recheck.  Return if any problems.

## 2019-11-07 ENCOUNTER — Encounter: Payer: Self-pay | Admitting: Family Medicine

## 2019-11-07 ENCOUNTER — Other Ambulatory Visit: Payer: Self-pay

## 2019-11-07 ENCOUNTER — Ambulatory Visit (INDEPENDENT_AMBULATORY_CARE_PROVIDER_SITE_OTHER): Payer: Medicare Other | Admitting: Family Medicine

## 2019-11-07 VITALS — BP 142/76 | HR 79 | Temp 97.6°F | Resp 15 | Ht 67.5 in | Wt 287.1 lb

## 2019-11-07 DIAGNOSIS — Z09 Encounter for follow-up examination after completed treatment for conditions other than malignant neoplasm: Secondary | ICD-10-CM

## 2019-11-07 DIAGNOSIS — L03115 Cellulitis of right lower limb: Secondary | ICD-10-CM | POA: Diagnosis not present

## 2019-11-07 MED ORDER — SACCHAROMYCES BOULARDII 250 MG PO CAPS: 250.0000 mg | ORAL_CAPSULE | Freq: Two times a day (BID) | ORAL | 0 refills | Status: DC

## 2019-11-07 MED ORDER — CEPHALEXIN 500 MG PO CAPS: 500.0000 mg | ORAL_CAPSULE | Freq: Four times a day (QID) | ORAL | 0 refills | Status: AC

## 2019-11-07 NOTE — Assessment & Plan Note (Signed)
Received IV vancomycin and Rocephin in the hospital.  Reports that he felt good on Sunday but then did not notice much progression in the last couple days.  Is taking his doxycycline as directed.  Will add to this Keflex and a probiotic as well.  Follow-up in 1 week to assess situation to make sure is not getting worse.  Return precautions to the ED reviewed Patient acknowledged agreement and understanding of the plan.

## 2019-11-07 NOTE — Assessment & Plan Note (Signed)
Reviewed hospital note and labs. See documentation note.  Adjustment to medication regime by addition of Keflex.

## 2019-11-07 NOTE — Patient Instructions (Signed)
May you have a year filled with hope, love, happiness and laughter.  I appreciate the opportunity to provide you with care for your health and wellness. Today we discussed: leg infection  Follow up: 1 week by phone   No labs or referrals today  Take both doxycyline and keflex daily until you complete the full course  Take probiotic 2 twice daily for 30 days  Please continue to practice social distancing to keep you, your family, and our community safe.  If you must go out, please wear a mask and practice good handwashing.  It was a pleasure to see you and I look forward to continuing to work together on your health and well-being. Please do not hesitate to call the office if you need care or have questions about your care.  Have a wonderful day and week. With Gratitude, Cherly Beach, DNP, AGNP-BC

## 2019-11-07 NOTE — Progress Notes (Signed)
Subjective:  Patient ID: Christopher Lambert, male    DOB: [birth year]  Age: 64 y.o. MRN: [id number]  CC:  Chief Complaint  Patient presents with  . Follow-up    from ED visit. not getting any better. still tight and red looking      HPI  HPI Christopher Lambert Done is a 64 year old male patient who presents today for follow-up from going to the emergency room. He presented to the emergency room on February 20 around 7 in the morning secondary to having leg pain. Right leg lower and upper.  Last 2 weeks he had noticed some aching and increased discomfort.  Tightness and redness in the lower extremity of the right leg.  It was constant in nature and was just worsening.  Was not relieved by anything and not worsened by anything outside of excessive walking or putting pressure on it.  Reports that he has pressure when he gets up in the morning but once he moves around he gets a little bit better.  Denies having any falls or fractures reports it swollen and painful.  Today in the office he reports that he has not changed much since he started taking the medicine that he was given at the emergency room.  He reported Sunday he felt the best and he has a little bit of less tightening feeling/swelling but overall it has not improved much at all.  So he was advised to follow-up with his PCP.  Today patient denies signs and symptoms of COVID 19 infection including fever, chills, cough, shortness of breath, and headache. Past Medical, Surgical, Social History, Allergies, and Medications have been Reviewed.   Past Medical History:  Diagnosis Date  . Arthritis of knee    Bilateral  . 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 ED (erectile dysfunction)   . Essential hypertension, benign 1990  . H/O: whooping cough    aschild  . Hemorrhoids, internal   . Obesity   . Obstructive sleep apnea 11/09/2007   NPSG 08/26/04 AHI 92/hr, [place], [place] NPSG 01/31/15- severe OSA AHI 49.4/ hr, CPAP suggested  13, AHI 0.0, weight 294 lbs CPAP Advanced    . Seasonal allergies   . Sleep apnea 2004   wears CPAP nightly      Current Meds  Medication Sig  . allopurinol (ZYLOPRIM) 300 MG tablet Take 1 tablet (300 mg total) by mouth daily.  Marland Kitchen aspirin 81 MG EC tablet TAKE 1 TABLET EVERY DAY (Patient taking differently: Take 81 mg by mouth daily. )  . cholecalciferol (VITAMIN D) 1000 UNITS tablet Take 1,000 Units by mouth daily.    Marland Kitchen doxycycline (VIBRA-TABS) 100 MG tablet Take 1 tablet (100 mg total) by mouth 2 (two) times daily.  Marland Kitchen ELDERBERRY PO Take 1 tablet by mouth daily.  . fluticasone (FLONASE) 50 MCG/ACT nasal spray Place 2 sprays into both nostrils 2 (two) times daily.  . Multiple Vitamin (MULTIVITAMIN WITH MINERALS) TABS tablet Take 1 tablet by mouth daily.  Marland Kitchen NIFEdipine (PROCARDIA XL/NIFEDICAL-XL) 90 MG 24 hr tablet TAKE 1 TABLET BY MOUTH EVERY DAY  . simvastatin (ZOCOR) 5 MG tablet TAKE 1 TABLET BY MOUTH EVERYDAY AT BEDTIME (Patient taking differently: Take 5 mg by mouth every morning. )    ROS:  Review of Systems  HENT: Negative.   Eyes: Negative.   Respiratory: Negative.   Cardiovascular: Negative.   Gastrointestinal: Negative.   Genitourinary: Negative.   Musculoskeletal: Negative.  Skin: Positive for rash.       Cellulitis  Neurological: Negative.   Endo/Heme/Allergies: Negative.   Psychiatric/Behavioral: Negative.   All other systems reviewed and are negative.    Objective:   Today's Vitals: BP (!) 142/76   Pulse 79   Temp 97.6 F (36.4 C) (Temporal)   Resp 15   Ht 5' 7.5" (1.715 m)   Wt 287 lb 1.9 oz (130.2 kg)   SpO2 96%   BMI 44.31 kg/m  Vitals with BMI 11/07/2019 11/04/2019 11/04/2019  Height 5' 7.5" - -  Weight 287 lbs 2 oz - -  BMI [id number] - -  Systolic [id number] [id number]  Diastolic 76 57 59  Pulse 79 76 77     Physical Exam Vitals and nursing note reviewed.  Constitutional:      Appearance: Normal appearance. He is well-developed and well-groomed. He is  obese.  HENT:     Head: Normocephalic and atraumatic.     Right Ear: External ear normal.     Left Ear: External ear normal.     Mouth/Throat:     Comments: Mask in place Eyes:     General:        Right eye: No discharge.        Left eye: No discharge.     Conjunctiva/sclera: Conjunctivae normal.  Cardiovascular:     Rate and Rhythm: Normal rate and regular rhythm.     Pulses: Normal pulses.     Heart sounds: Normal heart sounds.  Pulmonary:     Effort: Pulmonary effort is normal.     Breath sounds: Normal breath sounds.  Musculoskeletal:        General: Normal range of motion.     Cervical back: Normal range of motion and neck supple.     Right lower leg: 3+ Edema present.     Left lower leg: Edema present.  Skin:    General: Skin is warm.     Comments: Right shin noted to have 3+ edema and tightness, shiny red skin.  Neurological:     General: No focal deficit present.     Mental Status: He is alert and oriented to person, place, and time.  Psychiatric:        Attention and Perception: Attention normal.        Mood and Affect: Mood normal.        Speech: Speech normal.        Behavior: Behavior normal. Behavior is cooperative.        Thought Content: Thought content normal.        Cognition and Memory: Cognition normal.        Judgment: Judgment normal.     Assessment   1. Cellulitis of right lower extremity   2. Encounter for examination following treatment at hospital     Tests ordered No orders of the defined types were placed in this encounter.    Plan: Please see assessment and plan per problem list above.   Meds ordered this encounter  Medications  . cephALEXin (KEFLEX) 500 MG capsule    Sig: Take 1 capsule (500 mg total) by mouth 4 (four) times daily for 5 days.    Dispense:  20 capsule    Refill:  0    Order Specific Question:   Supervising Provider    Answer:   [name], MARGARET E [id number]  . saccharomyces boulardii (FLORASTOR) 250 MG capsule      Sig: Take 1 capsule (250  mg total) by mouth 2 (two) times daily.    Dispense:  60 capsule    Refill:  0    Order Specific Question:   Supervising Provider    Answer:   Jacklynn Bue    Patient to follow-up in 11/15/2019   Perlie Mayo, NP

## 2019-11-09 ENCOUNTER — Ambulatory Visit: Payer: Medicare Other | Admitting: Family Medicine

## 2019-11-14 ENCOUNTER — Emergency Department (HOSPITAL_COMMUNITY): Payer: Medicare Other

## 2019-11-14 ENCOUNTER — Encounter (HOSPITAL_COMMUNITY): Payer: Self-pay | Admitting: Emergency Medicine

## 2019-11-14 ENCOUNTER — Other Ambulatory Visit: Payer: Self-pay

## 2019-11-14 ENCOUNTER — Ambulatory Visit (INDEPENDENT_AMBULATORY_CARE_PROVIDER_SITE_OTHER): Payer: Medicare Other | Admitting: Family Medicine

## 2019-11-14 ENCOUNTER — Encounter: Payer: Self-pay | Admitting: Family Medicine

## 2019-11-14 ENCOUNTER — Inpatient Hospital Stay (HOSPITAL_COMMUNITY)
Admission: EM | Admit: 2019-11-14 | Discharge: 2019-11-16 | DRG: 603 | Disposition: A | Discharge status: Home / Self Care | Payer: Medicare Other | Attending: Internal Medicine | Admitting: Internal Medicine

## 2019-11-14 VITALS — BP 144/84 | HR 73 | Temp 97.8°F | Resp 15 | Ht 67.5 in | Wt 282.1 lb

## 2019-11-14 DIAGNOSIS — Z7982 Long term (current) use of aspirin: Secondary | ICD-10-CM | POA: Diagnosis not present

## 2019-11-14 DIAGNOSIS — Z79899 Other long term (current) drug therapy: Secondary | ICD-10-CM | POA: Diagnosis not present

## 2019-11-14 DIAGNOSIS — L02415 Cutaneous abscess of right lower limb: Secondary | ICD-10-CM | POA: Diagnosis present

## 2019-11-14 DIAGNOSIS — Z96653 Presence of artificial knee joint, bilateral: Secondary | ICD-10-CM | POA: Diagnosis present

## 2019-11-14 DIAGNOSIS — Z833 Family history of diabetes mellitus: Secondary | ICD-10-CM | POA: Diagnosis not present

## 2019-11-14 DIAGNOSIS — Z8249 Family history of ischemic heart disease and other diseases of the circulatory system: Secondary | ICD-10-CM | POA: Diagnosis not present

## 2019-11-14 DIAGNOSIS — E785 Hyperlipidemia, unspecified: Secondary | ICD-10-CM | POA: Diagnosis not present

## 2019-11-14 DIAGNOSIS — G4733 Obstructive sleep apnea (adult) (pediatric): Secondary | ICD-10-CM | POA: Diagnosis present

## 2019-11-14 DIAGNOSIS — Z87891 Personal history of nicotine dependence: Secondary | ICD-10-CM | POA: Diagnosis not present

## 2019-11-14 DIAGNOSIS — Z6841 Body Mass Index (BMI) 40.0 and over, adult: Secondary | ICD-10-CM | POA: Diagnosis not present

## 2019-11-14 DIAGNOSIS — I10 Essential (primary) hypertension: Secondary | ICD-10-CM | POA: Diagnosis present

## 2019-11-14 DIAGNOSIS — L03115 Cellulitis of right lower limb: Principal | ICD-10-CM | POA: Diagnosis present

## 2019-11-14 DIAGNOSIS — Z800 Family history of malignant neoplasm of digestive organs: Secondary | ICD-10-CM | POA: Diagnosis not present

## 2019-11-14 DIAGNOSIS — M109 Gout, unspecified: Secondary | ICD-10-CM | POA: Diagnosis present

## 2019-11-14 DIAGNOSIS — M7989 Other specified soft tissue disorders: Secondary | ICD-10-CM | POA: Diagnosis present

## 2019-11-14 DIAGNOSIS — N529 Male erectile dysfunction, unspecified: Secondary | ICD-10-CM | POA: Diagnosis present

## 2019-11-14 DIAGNOSIS — Z20822 Contact with and (suspected) exposure to covid-19: Secondary | ICD-10-CM | POA: Diagnosis present

## 2019-11-14 DIAGNOSIS — K648 Other hemorrhoids: Secondary | ICD-10-CM | POA: Diagnosis present

## 2019-11-14 DIAGNOSIS — E119 Type 2 diabetes mellitus without complications: Secondary | ICD-10-CM | POA: Diagnosis present

## 2019-11-14 LAB — CBC WITH DIFFERENTIAL/PLATELET
Abs Immature Granulocytes: 0.01 10*3/uL (ref 0.00–0.07)
Basophils Absolute: 0 10*3/uL (ref 0.0–0.1)
Basophils Relative: 0 %
Eosinophils Absolute: 0.2 10*3/uL (ref 0.0–0.5)
Eosinophils Relative: 3 %
HCT: 39.6 % (ref 39.0–52.0)
Hemoglobin: 13.3 g/dL (ref 13.0–17.0)
Immature Granulocytes: 0 %
Lymphocytes Relative: 37 %
Lymphs Abs: 1.8 10*3/uL (ref 0.7–4.0)
MCH: 31.1 pg (ref 26.0–34.0)
MCHC: 33.6 g/dL (ref 30.0–36.0)
MCV: 92.5 fL (ref 80.0–100.0)
Monocytes Absolute: 0.5 10*3/uL (ref 0.1–1.0)
Monocytes Relative: 10 %
Neutro Abs: 2.3 10*3/uL (ref 1.7–7.7)
Neutrophils Relative %: 50 %
Platelets: 326 10*3/uL (ref 150–400)
RBC: 4.28 MIL/uL (ref 4.22–5.81)
RDW: 13 % (ref 11.5–15.5)
WBC: 4.7 10*3/uL (ref 4.0–10.5)
nRBC: 0 % (ref 0.0–0.2)

## 2019-11-14 LAB — BASIC METABOLIC PANEL
Anion gap: 8 (ref 5–15)
BUN: 17 mg/dL (ref 8–23)
CO2: 27 mmol/L (ref 22–32)
Calcium: 8.4 mg/dL — ABNORMAL LOW (ref 8.9–10.3)
Chloride: 101 mmol/L (ref 98–111)
Creatinine, Ser: 1.05 mg/dL (ref 0.61–1.24)
GFR calc Af Amer: >60 mL/min (ref >60)
GFR calc non Af Amer: >60 mL/min (ref >60)
Glucose, Bld: 87 mg/dL (ref 70–99)
Potassium: 3.3 mmol/L — ABNORMAL LOW (ref 3.5–5.1)
Sodium: 136 mmol/L (ref 135–145)

## 2019-11-14 LAB — GLUCOSE, CAPILLARY: Glucose-Capillary: 148 mg/dL — ABNORMAL HIGH (ref 70–99)

## 2019-11-14 LAB — LACTIC ACID, PLASMA: Lactic Acid, Venous: 1.3 mmol/L (ref 0.5–1.9)

## 2019-11-14 MED ORDER — ENOXAPARIN SODIUM 80 MG/0.8ML ~~LOC~~ SOLN
0.5000 mg/kg | SUBCUTANEOUS | Status: DC
  Administered 2019-11-14 – 2019-11-15 (×2): 65 mg via SUBCUTANEOUS
  Filled 2019-11-14 (×2): qty 0.8

## 2019-11-14 MED ORDER — ACETAMINOPHEN 325 MG PO TABS: 650.0000 mg | ORAL_TABLET | Freq: Four times a day (QID) | ORAL | Status: DC | PRN

## 2019-11-14 MED ORDER — VANCOMYCIN HCL 2000 MG/400ML IV SOLN
2000.0000 mg | Freq: Once | INTRAVENOUS | Status: DC
  Administered 2019-11-14: 2000 mg via INTRAVENOUS
  Filled 2019-11-14: qty 400

## 2019-11-14 MED ORDER — VANCOMYCIN HCL IN DEXTROSE 1-5 GM/200ML-% IV SOLN: 1000.0000 mg | Freq: Two times a day (BID) | INTRAVENOUS | Status: DC

## 2019-11-14 MED ORDER — SODIUM CHLORIDE 0.9% FLUSH: 3.0000 mL | INTRAVENOUS | Status: DC | PRN

## 2019-11-14 MED ORDER — NIFEDIPINE ER OSMOTIC RELEASE 30 MG PO TB24
60.0000 mg | ORAL_TABLET | Freq: Every morning | ORAL | Status: DC
  Administered 2019-11-15 – 2019-11-16 (×2): 60 mg via ORAL
  Filled 2019-11-14 (×2): qty 2

## 2019-11-14 MED ORDER — TRAMADOL HCL 50 MG PO TABS: 50.0000 mg | ORAL_TABLET | Freq: Three times a day (TID) | ORAL | Status: DC | PRN

## 2019-11-14 MED ORDER — ACETAMINOPHEN 650 MG RE SUPP: 650.0000 mg | Freq: Four times a day (QID) | RECTAL | Status: DC | PRN

## 2019-11-14 MED ORDER — SIMVASTATIN 10 MG PO TABS
5.0000 mg | ORAL_TABLET | Freq: Every morning | ORAL | Status: DC
  Administered 2019-11-15 – 2019-11-16 (×2): 5 mg via ORAL
  Filled 2019-11-14 (×2): qty 1

## 2019-11-14 MED ORDER — ADULT MULTIVITAMIN W/MINERALS CH
1.0000 | ORAL_TABLET | Freq: Every day | ORAL | Status: DC
  Administered 2019-11-15 – 2019-11-16 (×2): 1 via ORAL
  Filled 2019-11-14 (×2): qty 1

## 2019-11-14 MED ORDER — ONDANSETRON HCL 4 MG/2ML IJ SOLN: 4.0000 mg | Freq: Four times a day (QID) | INTRAMUSCULAR | Status: DC | PRN

## 2019-11-14 MED ORDER — SODIUM CHLORIDE 0.9% FLUSH
3.0000 mL | Freq: Two times a day (BID) | INTRAVENOUS | Status: DC
  Administered 2019-11-14 – 2019-11-16 (×3): 3 mL via INTRAVENOUS

## 2019-11-14 MED ORDER — IOHEXOL 300 MG/ML  SOLN
75.0000 mL | Freq: Once | INTRAMUSCULAR | Status: AC | PRN
  Administered 2019-11-14: 75 mL via INTRAVENOUS

## 2019-11-14 MED ORDER — CLINDAMYCIN PHOSPHATE 600 MG/50ML IV SOLN: 600.0000 mg | Freq: Three times a day (TID) | INTRAVENOUS | Status: DC

## 2019-11-14 MED ORDER — ONDANSETRON HCL 4 MG PO TABS: 4.0000 mg | ORAL_TABLET | Freq: Four times a day (QID) | ORAL | Status: DC | PRN

## 2019-11-14 MED ORDER — INSULIN ASPART 100 UNIT/ML ~~LOC~~ SOLN: 0.0000 [IU] | Freq: Every day | SUBCUTANEOUS | Status: DC

## 2019-11-14 MED ORDER — BISACODYL 10 MG RE SUPP: 10.0000 mg | Freq: Every day | RECTAL | Status: DC | PRN

## 2019-11-14 MED ORDER — ASPIRIN EC 81 MG PO TBEC
81.0000 mg | DELAYED_RELEASE_TABLET | Freq: Every day | ORAL | Status: DC
  Administered 2019-11-15 – 2019-11-16 (×2): 81 mg via ORAL
  Filled 2019-11-14 (×5): qty 1

## 2019-11-14 MED ORDER — POLYETHYLENE GLYCOL 3350 17 G PO PACK: 17.0000 g | PACK | Freq: Every day | ORAL | Status: DC | PRN

## 2019-11-14 MED ORDER — ENOXAPARIN SODIUM 40 MG/0.4ML ~~LOC~~ SOLN: 40.0000 mg | SUBCUTANEOUS | Status: DC

## 2019-11-14 MED ORDER — CLINDAMYCIN PHOSPHATE 900 MG/50ML IV SOLN
900.0000 mg | Freq: Three times a day (TID) | INTRAVENOUS | Status: AC
  Administered 2019-11-14 – 2019-11-15 (×4): 900 mg via INTRAVENOUS
  Filled 2019-11-14 (×9): qty 50

## 2019-11-14 MED ORDER — SODIUM CHLORIDE 0.9 % IV SOLN: 250.0000 mL | INTRAVENOUS | Status: DC | PRN

## 2019-11-14 MED ORDER — LACTATED RINGERS IV SOLN: INTRAVENOUS | Status: DC

## 2019-11-14 MED ORDER — VITAMIN D 25 MCG (1000 UNIT) PO TABS
1000.0000 [IU] | ORAL_TABLET | Freq: Every day | ORAL | Status: DC
  Administered 2019-11-15 – 2019-11-16 (×2): 1000 [IU] via ORAL
  Filled 2019-11-14 (×3): qty 1

## 2019-11-14 MED ORDER — INSULIN ASPART 100 UNIT/ML ~~LOC~~ SOLN: 0.0000 [IU] | Freq: Three times a day (TID) | SUBCUTANEOUS | Status: DC

## 2019-11-14 MED ORDER — ALLOPURINOL 300 MG PO TABS
300.0000 mg | ORAL_TABLET | Freq: Every day | ORAL | Status: DC
  Administered 2019-11-15 – 2019-11-16 (×2): 300 mg via ORAL
  Filled 2019-11-14 (×3): qty 1

## 2019-11-14 NOTE — Progress Notes (Signed)
Subjective:  Patient ID: Christopher Lambert, male    DOB: 01-Jun-1956  Age: 64 y.o. MRN: [id number]  CC:  Chief Complaint  Patient presents with  . Leg Swelling    swelling is worse this week, goes down some at night, but as soon as he is up and about it goes back to the way it was      HPI  HPI   Christopher Lambert is a 64 year old male patient who presents today for follow-up secondary to having infection of the leg.  Went to the emergency room on February 20 and was treated for cellulitis.  Home on doxycycline.  Ultrasound was negative for any blood clots.  He presented back to be on February 23.  Had not had much change since then reported that he did really well on the IV medication he felt.  But then once 24 hours after stopping that he started feeling worse again.  Started him on Keflex and congruence with doxycycline.  Having him follow-up today to see if this got any better.  And it is not it is gotten worse the swelling is worse in the tightness is much worse.  He is in agreements to go back to the emergency room given the discomfort and the lack of change with double antibiotic treatment.  He denies having any nausea, vomiting, changes in bowel or bladder habit.  Denies having cough fever chills or shortness of breath.  Denies having any chest pain headaches or dizziness.  Today patient denies signs and symptoms of COVID 19 infection including fever, chills, cough, shortness of breath, and headache. Past Medical, Surgical, Social History, Allergies, and Medications have been Reviewed.   Past Medical History:  Diagnosis Date  . Arthritis of knee    Bilateral  . 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 ED (erectile dysfunction)   . Essential hypertension, benign 1990  . H/O: whooping cough    aschild  . Hemorrhoids, internal   . Obesity   . Obstructive sleep apnea 11/09/2007   NPSG 08/26/04 AHI 92/hr, [place], [place] NPSG 01/31/15- severe OSA AHI 49.4/ hr, CPAP  suggested 13, AHI 0.0, weight 294 lbs CPAP Advanced    . Seasonal allergies   . Sleep apnea 2004   wears CPAP nightly      Current Meds  Medication Sig  . allopurinol (ZYLOPRIM) 300 MG tablet Take 1 tablet (300 mg total) by mouth daily.  Marland Kitchen aspirin 81 MG EC tablet TAKE 1 TABLET EVERY DAY (Patient taking differently: Take 81 mg by mouth daily. )  . cholecalciferol (VITAMIN D) 1000 UNITS tablet Take 1,000 Units by mouth daily.    Marland Kitchen doxycycline (VIBRA-TABS) 100 MG tablet Take 1 tablet (100 mg total) by mouth 2 (two) times daily.  Marland Kitchen ELDERBERRY PO Take 1 tablet by mouth daily.  . fluticasone (FLONASE) 50 MCG/ACT nasal spray Place 2 sprays into both nostrils 2 (two) times daily.  . Multiple Vitamin (MULTIVITAMIN WITH MINERALS) TABS tablet Take 1 tablet by mouth daily.  Marland Kitchen NIFEdipine (PROCARDIA XL/NIFEDICAL-XL) 90 MG 24 hr tablet TAKE 1 TABLET BY MOUTH EVERY DAY  . saccharomyces boulardii (FLORASTOR) 250 MG capsule Take 1 capsule (250 mg total) by mouth 2 (two) times daily.  . simvastatin (ZOCOR) 5 MG tablet TAKE 1 TABLET BY MOUTH EVERYDAY AT BEDTIME (Patient taking differently: Take 5 mg by mouth every morning. )    ROS:  Review of Systems  HENT:  Negative.   Eyes: Negative.   Respiratory: Negative.   Cardiovascular: Negative.   Gastrointestinal: Negative.   Genitourinary: Negative.   Musculoskeletal:       See hpi  Skin: Negative.   Neurological: Negative.   Endo/Heme/Allergies: Negative.   Psychiatric/Behavioral: Negative.   All other systems reviewed and are negative.    Objective:   Today's Vitals: BP (!) 144/84   Pulse 73   Temp 97.8 F (36.6 C) (Temporal)   Resp 15   Ht 5' 7.5" (1.715 m)   Wt 282 lb 1.3 oz (128 kg)   SpO2 98%   BMI 43.53 kg/m  Vitals with BMI 11/14/2019 11/07/2019 11/04/2019  Height 5' 7.5" 5' 7.5" -  Weight 282 lbs 1 oz 287 lbs 2 oz -  BMI [id number] -  Systolic 123456 [id number]  Diastolic 84 76 57  Pulse 73 79 76     Physical Exam Vitals and nursing  note reviewed.  Constitutional:      Appearance: Normal appearance. He is obese.  HENT:     Head: Normocephalic and atraumatic.     Right Ear: External ear normal.     Left Ear: External ear normal.     Mouth/Throat:     Comments: Mask in place Eyes:     General:        Right eye: No discharge.        Left eye: No discharge.     Conjunctiva/sclera: Conjunctivae normal.  Cardiovascular:     Rate and Rhythm: Normal rate and regular rhythm.     Pulses: Normal pulses.     Heart sounds: Normal heart sounds.  Pulmonary:     Effort: Pulmonary effort is normal.     Breath sounds: Normal breath sounds.  Musculoskeletal:     Cervical back: Normal range of motion and neck supple.  Feet:     Right foot:     Skin integrity: Erythema and warmth present.     Comments: Tightness, erythematous, warmth.  Discomfort to touch.   Skin:    General: Skin is warm.     Findings: Erythema present.  Neurological:     General: No focal deficit present.     Mental Status: He is alert and oriented to person, place, and time.  Psychiatric:        Mood and Affect: Mood normal.        Behavior: Behavior normal.        Thought Content: Thought content normal.        Judgment: Judgment normal.      Assessment   1. Cellulitis of right lower extremity     Tests ordered No orders of the defined types were placed in this encounter.    Plan: Please see assessment and plan per problem list above.   No orders of the defined types were placed in this encounter.   Patient to follow-up in 03/11/2020   Perlie Mayo, NP

## 2019-11-14 NOTE — ED Notes (Signed)
Pt eating dinner

## 2019-11-14 NOTE — H&P (Signed)
History and Physical    Patient Demographics:    Christopher Lambert [id number] DOB: [birth year] DOA: 11/14/2019  PCP: [name], MD  Patient coming from: Home  I have personally briefly reviewed patient's old medical records in [place]  Chief Complaint: Right lower extremity swelling   Assessment & Plan:     Assessment/Plan Principal Problem:   Nonpurulent Cellulitis of right leg Active Problems:   Morbid obesity ([place])   Essential hypertension, benign   Dyslipidemia, goal LDL below 100     Principal Problem: Right lower extremity nonpurulent cellulitis Patient presented with worsening right lower extremity swelling, tenderness, pain for the past 2 weeks.  Has failed outpatient antibiotic course with doxycycline as well as cephalexin.  Continues to have worsening swelling, pain.  No fever noted.  No leukocytosis. Right lower venous ultrasound shows no evidence of DVT.  CT of the right lower extremity shows diffuse subcutaneous edema throughout the right lower leg consistent with cellulitis.  No evidence of abscess, subcutaneous gas, osteomyelitis. -We will place on IV antibiotics -Monitor CBC, temp  Other Active Problems:  Hypertension -Continue nifedipine as tolerated  Hyperlipidemia -Continue simvastatin  Diabetes mellitus type 2, diet controlled -We will place on sliding scale insulin coverage with fingerstick monitoring  Obstructive sleep apnea -Continue CPAP  Gout -Continue allopurinol  Morbid obesity BMI 44 -Nutritional counseling, caloric control  DVT prophylaxis: Lovenox Code Status:  Full code Family Communication: N/A  Disposition Plan: Admitted as inpatient for right lower extremity cellulitis with failure of outpatient therapy, expect 2 to 3-day inpatient stay Consults called: N/A Admission status: Inpatient status    HPI:     HPI: Christopher Lambert is a 64 y.o. male with medical history significant of hypertension,  hyperlipidemia, diabetes mellitus type 2, morbid obesity, gout, obstructive sleep apnea who presented to the ER with worsening right lower extremity swelling.  Patient was initially seen in the ER on 11/04/2019 with worsening right lower extremity swelling and was thought to have cellulitis.  He received 1 dose of Rocephin and vancomycin and was discharged home on doxycycline.  He subsequently had a follow-up as outpatient on 11/07/2019 and was given additional coverage with Keflex due to persistent symptoms.  Patient subsequently had another follow-up today as outpatient with his family medicine NP and was noted to have no improvement in symptoms and sent back to the ER for IV antibiotics.  He has had no discharge from the right lower extremity, no fever, chills. No chest pain, shortness of breath, palpitations, cough. No nausea, vomiting, abdominal pain, dysuria, diarrhea. No syncope, seizures, dizziness, lightheadedness, focal deficits, visual deficits, neck stiffness, headache. ED Course:  Vital Signs reviewed on presentation, significant for temperature 98.7, heart rate 60, blood pressure 103/57, saturation 100% on room air. Labs reviewed, significant for sodium 136, potassium 3.3, BUN 17, creatinine 1.05, lactic acid 1.3, WBC count 4.7, hemoglobin 13.3, hematocrit 39, platelets 326.  Blood cultures have been sent. Imaging personally Reviewed, Right lower venous ultrasound shows no evidence of DVT.  CT of the right lower extremity shows diffuse subcutaneous edema throughout the right lower leg consistent with cellulitis.  No evidence of abscess, subcutaneous gas, osteomyelitis.    Review of systems:    Review of Systems: As per HPI otherwise 10 point review of systems negative.  All other review of systems is negative except the ones noted above in the HPI.    Past Medical and Surgical History:  Reviewed by me  Past Medical  History:  Diagnosis Date  . Arthritis of knee    Bilateral  .  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 ED (erectile dysfunction)   . Essential hypertension, benign 1990  . H/O: whooping cough    [place]  . Hemorrhoids, internal   . Obesity   . Obstructive sleep apnea 11/09/2007   NPSG 08/26/04 AHI 92/hr, [place], [place] NPSG 01/31/15- severe OSA AHI 49.4/ hr, CPAP suggested 13, AHI 0.0, weight 294 lbs CPAP Advanced    . Seasonal allergies   . Sleep apnea 2004   wears CPAP nightly      Past Surgical History:  Procedure Laterality Date  . CARDIAC CATHETERIZATION  09/25/2011  . CARPAL TUNNEL RELEASE     Right   . COLONOSCOPY N/A 03/23/2018   Procedure: COLONOSCOPY;  Surgeon: Daneil Dolin, MD;  [facility];  Service: Endoscopy;  Laterality: N/A;  8:00  . HERNIA REPAIR  [id number] approx   umbilical  . JOINT REPLACEMENT  2011   right knee  . JOINT REPLACEMENT  2013   left knee  . left knee replacement   sept 2013  . POLYPECTOMY  03/23/2018   Procedure: POLYPECTOMY;  Surgeon: Daneil Dolin, MD;  Location: [facility];  Service: Endoscopy;;  colon  . Right knee replacement  06/2010  . TOTAL KNEE ARTHROPLASTY  05/18/2012   Procedure: TOTAL KNEE ARTHROPLASTY;  Surgeon: Ninetta Lights, MD;  Location: [facility];  Service: Orthopedics;  Laterality: Left;  left total knee arthroplasty  . UMBILICAL HERNIA REPAIR       Social History:  Reviewed by me   reports that he has quit smoking. His smoking use included cigarettes. He smoked 0.50 packs per day. He has never used smokeless tobacco. He reports that he does not drink alcohol or use drugs.  Allergies:    No Known Allergies  Family History :   Family History  Problem Relation Age of Onset  . Coronary artery disease Brother 48       Premature  . Hypertension Brother   . Hypertension Father   . Cancer Father 68       colon   . Hypertension Mother   . Diabetes Brother   . Hypertension Sister   . Hypertension Sister   . Hypertension Sister   . Hypertension Sister     Family history reviewed, noted as above, not pertinent to current presentation.   Home Medications:    Prior to Admission medications   Medication Sig Start Date End Date Taking? Authorizing Provider  allopurinol (ZYLOPRIM) 300 MG tablet Take 1 tablet (300 mg total) by mouth daily. 10/13/19  Yes [name], MD  aspirin 81 MG EC tablet TAKE 1 TABLET EVERY DAY Patient taking differently: Take 81 mg by mouth daily.  02/12/14  Yes [name], MD  cholecalciferol (VITAMIN D) 1000 UNITS tablet Take 1,000 Units by mouth daily.     Yes [provider]  doxycycline (VIBRA-TABS) 100 MG tablet Take 1 tablet (100 mg total) by mouth 2 (two) times daily. 11/04/19  Yes Caryl Ada K, PA-C  ELDERBERRY PO Take 1 tablet by mouth daily.   Yes [provider]  fluticasone (FLONASE) 50 MCG/ACT nasal spray Place 2 sprays into both nostrils 2 (two) times daily. Patient taking differently: Place 2 sprays into both nostrils daily as needed for allergies or rhinitis.  06/26/19  Yes [name], MD  Multiple Vitamin (MULTIVITAMIN WITH MINERALS) TABS tablet Take 1 tablet by mouth daily.   Yes [provider]  NIFEdipine (PROCARDIA XL/NIFEDICAL-XL) 90 MG 24 hr tablet TAKE 1 TABLET BY MOUTH EVERY DAY Patient taking differently: Take 90 mg by mouth in the morning.  09/01/19  Yes [name], MD  simvastatin (ZOCOR) 5 MG tablet TAKE 1 TABLET BY MOUTH EVERYDAY AT BEDTIME Patient taking differently: Take 5 mg by mouth every morning.  03/19/19  Yes Perlie Mayo, NP  saccharomyces boulardii (FLORASTOR) 250 MG capsule Take 1 capsule (250 mg total) by mouth 2 (two) times daily. Patient not taking: Reported on 11/14/2019 11/07/19 12/07/19  Perlie Mayo, NP    Physical Exam:    Physical Exam: Vitals:   11/14/19 1508 11/14/19 1510  BP:  (!) 103/57  Pulse:  60  Resp:  19  Temp:  98.7 F (37.1 C)  TempSrc:  Oral  SpO2:  100%  Weight: 127.9 kg   Height: 5\' 7"   (1.702 m)     Constitutional: NAD, calm, comfortable Vitals:   11/14/19 1508 11/14/19 1510  BP:  (!) 103/57  Pulse:  60  Resp:  19  Temp:  98.7 F (37.1 C)  TempSrc:  Oral  SpO2:  100%  Weight: 127.9 kg   Height: 5\' 7"  (1.702 m)    Eyes: PERRL, lids and conjunctivae normal ENMT: Mucous membranes are moist. Posterior pharynx clear of any exudate or lesions.Normal dentition.  Neck: normal, supple, no masses, no thyromegaly Respiratory: clear to auscultation bilaterally, no wheezing, no crackles. Normal respiratory effort. No accessory muscle use.  Cardiovascular: Regular rate and rhythm, no murmurs / rubs / gallops. 2+ pedal pulses. No carotid bruits.  Abdomen: no tenderness, no masses palpated. No hepatosplenomegaly. Bowel sounds positive.  Musculoskeletal: no clubbing / cyanosis.  Right lower extremity noted with significant swelling over the right leg with erythema, tenderness, warmth.  Good peripheral pulses.  Good ROM, no contractures. Normal muscle tone.  Skin: no rashes, lesions, ulcers. No induration Neurologic: CN 2-12 grossly intact. Sensation intact, DTR normal. Strength 5/5 in all 4.  Psychiatric: Normal judgment and insight. Alert and oriented x 3. Normal mood.    Decubitus Ulcers: Not present on admission Catheters and tubes: None  Data Review:    Labs on Admission: I have personally reviewed following labs and imaging studies  CBC: Recent Labs  Lab 11/14/19 1535  WBC 4.7  NEUTROABS 2.3  HGB 13.3  HCT 39.6  MCV 92.5  PLT [id number]   Basic Metabolic Panel: Recent Labs  Lab 11/14/19 1535  NA 136  K 3.3*  CL 101  CO2 27  GLUCOSE 87  BUN 17  CREATININE 1.05  CALCIUM 8.4*   GFR: Estimated Creatinine Clearance: 93.7 mL/min (by C-G formula based on SCr of 1.05 mg/dL). Liver Function Tests: No results for input(s): AST, ALT, ALKPHOS, BILITOT, PROT, ALBUMIN in the last 168 hours. No results for input(s): LIPASE, AMYLASE in the last 168 hours. No results  for input(s): AMMONIA in the last 168 hours. Coagulation Profile: No results for input(s): INR, PROTIME in the last 168 hours. Cardiac Enzymes: No results for input(s): CKTOTAL, CKMB, CKMBINDEX, TROPONINI in the last 168 hours. BNP (last 3 results) No results for input(s): PROBNP in the last 8760 hours. HbA1C: No results for input(s): HGBA1C in the last 72 hours. CBG: No results for input(s): GLUCAP in the last 168 hours. Lipid Profile: No results for input(s): CHOL, HDL, LDLCALC, TRIG, CHOLHDL, LDLDIRECT  in the last 72 hours. Thyroid Function Tests: No results for input(s): TSH, T4TOTAL, FREET4, T3FREE, THYROIDAB in the last 72 hours. Anemia Panel: No results for input(s): VITAMINB12, FOLATE, FERRITIN, TIBC, IRON, RETICCTPCT in the last 72 hours. Urine analysis:    Component Value Date/Time   COLORURINE AMBER (A) 04/16/2019 1642   APPEARANCEUR CLOUDY (A) 04/16/2019 1642   LABSPEC 1.019 04/16/2019 1642   PHURINE 5.0 04/16/2019 1642   GLUCOSEU NEGATIVE 04/16/2019 1642   HGBUR LARGE (A) 04/16/2019 1642   BILIRUBINUR NEGATIVE 04/16/2019 1642   BILIRUBINUR small 06/22/2016 1310   KETONESUR NEGATIVE 04/16/2019 1642   PROTEINUR 30 (A) 04/16/2019 1642   UROBILINOGEN 1.0 06/22/2016 1310   UROBILINOGEN 0.2 05/12/2012 0901   NITRITE NEGATIVE 04/16/2019 1642   LEUKOCYTESUR MODERATE (A) 04/16/2019 1642     Imaging Results:      Radiological Exams on Admission: DG Tibia/Fibula Right  Result Date: 11/14/2019 CLINICAL DATA:  Right lower leg infection and swelling. EXAM: RIGHT TIBIA AND FIBULA - 2 VIEW COMPARISON:  March 26, 2010. FINDINGS: Status post right total knee arthroplasty. No fracture or dislocation is noted. No lytic destruction is seen to suggest osteomyelitis. Small linear metallic density is seen in soft tissues lateral to the midportion of the fibula consistent with retained foreign body. Diffuse soft tissue swelling and edema is noted concerning for possible cellulitis.  IMPRESSION: Diffuse soft tissue swelling and edema is noted concerning for cellulitis. Small linear metallic density is seen in soft tissues lateral to the midportion of the fibula consistent with retained foreign body. Electronically Signed   By: Marijo Conception M.D.   On: 11/14/2019 16:13   US Venous Img Lower Right (DVT Study)  Result Date: 11/14/2019 CLINICAL DATA:  64 year old male with right leg swelling x2 weeks. EXAM: Right LOWER EXTREMITY VENOUS DOPPLER ULTRASOUND TECHNIQUE: Gray-scale sonography with compression, as well as color and duplex ultrasound, were performed to evaluate the deep venous system(s) from the level of the common femoral vein through the popliteal and proximal calf veins. COMPARISON:  Ultrasound dated 11/04/2019. FINDINGS: VENOUS Normal compressibility of the common femoral, superficial femoral, and popliteal veins, as well as the visualized calf veins. Visualized portions of profunda femoral vein and great saphenous vein unremarkable. No filling defects to suggest DVT on grayscale or color Doppler imaging. Doppler waveforms show normal direction of venous flow, normal respiratory phasicity and response to augmentation. Limited views of the contralateral common femoral vein are unremarkable. OTHER There is diffuse subcutaneous soft tissue edema of the calf. Limitations: none IMPRESSION: No femoropopliteal DVT nor evidence of DVT within the visualized calf veins. If clinical symptoms are inconsistent or if there are persistent or worsening symptoms, further imaging (possibly involving the iliac veins) may be warranted. Electronically Signed   By: Anner Crete M.D.   On: 11/14/2019 16:26   CT EXTREMITY LOWER RIGHT W CONTRAST  Result Date: 11/14/2019 CLINICAL DATA:  Lower extremity cellulitis and edema. EXAM: CT OF THE LOWER RIGHT EXTREMITY WITH CONTRAST TECHNIQUE: Multidetector CT imaging of the lower right extremity was performed according to the standard protocol following  intravenous contrast administration. COMPARISON:  Radiographs dated 11/14/2019 CONTRAST:  65mL OMNIPAQUE IOHEXOL 300 MG/ML  SOLN FINDINGS: Bones/Joint/Cartilage No significant bone abnormality. Right total knee prosthesis. No ankle effusion. Muscles and Tendons The muscles and tendons of the right lower leg appear normal. Soft tissues There circumferential subcutaneous edema throughout the lower leg extending onto the dorsum of the foot. There is no discrete abscess. No appreciable  myositis. IMPRESSION: 1. Diffuse subcutaneous edema throughout the right lower leg extending onto the dorsum of the foot consistent with cellulitis. 2. No discrete abscess. 3. No evidence of myositis or osteomyelitis. Electronically Signed   By: Lorriane Shire M.D.   On: 11/14/2019 19:20      [name] Lebon Ginette Otto MD [facility]  If 7PM-7AM, please contact night-coverage   11/14/2019, 7:38 PM

## 2019-11-14 NOTE — ED Provider Notes (Signed)
  Face-to-face evaluation   History: He presents for evaluation of persistent and worsening pain and swelling of his right lower leg.  He has already been treated with oral antibiotics without improvement.  Physical exam: Obese, alert and cooperative.  Right lower leg tender swollen red, primarily posterior.  Neurovascular intact distally in the right foot.  No evidence for compartment syndrome.  Medical screening examination/treatment/procedure(s) were conducted as a shared visit with non-physician practitioner(s) and myself.  I personally evaluated the patient during the encounter    Daleen Bo, MD 11/14/19 2138

## 2019-11-14 NOTE — Progress Notes (Signed)
Pharmacy Antibiotic Note  Christopher Lambert is a 64 y.o. male admitted on 11/14/2019 with swelling and redness to RLE.  Pharmacy has been consulted for vancomycin dosing for cellulitis. He recently failed an outpatient course of doxycycline and cephalexin. WBC are normal and renal function is normal.  Plan: Vancomycin 2000 mg IV one then 1000 mg IV q12h    Goal AUC 400-550.    Expected AUC: 512.7    SCr used: 1.05 Monitor renal function, clinical progress, C/S Vancomycin levels as clinically indicated  Height: 5\' 7"  (170.2 cm) Weight: 282 lb (127.9 kg) IBW/kg (Calculated) : 66.1  Temp (24hrs), Avg:98.3 F (36.8 C), Min:97.8 F (36.6 C), Max:98.7 F (37.1 C)  Recent Labs  Lab 11/14/19 1535 11/14/19 1546  WBC 4.7  --   CREATININE 1.05  --   LATICACIDVEN  --  1.3    Estimated Creatinine Clearance: 93.7 mL/min (by C-G formula based on SCr of 1.05 mg/dL).    No Known Allergies  Antimicrobials this admission: vancomycin 3/2 >>   Dose adjustments this admission:   Microbiology results: 3/2 BCx: pending   Thank you for involving pharmacy in this patient's care.  Renold Genta, PharmD, BCPS Clinical Pharmacist Clinical phone for 11/14/2019 until 9p  11/14/2019 7:29 PM  **Pharmacist phone directory can be found on [email or web address] listed under [place]**

## 2019-11-14 NOTE — Assessment & Plan Note (Signed)
He is not improved with both doxycycline and Keflex.  Reports the only time that he felt the best was when he was in the hospital getting IV therapy.  I have advised for him to go back to the emergency room for further evaluation to make sure that he does not have a clot even though he was negative on their ultrasound at the last visit.  In addition concern for osteomyelitis.  And possible need for surgical intervention.  Patient acknowledged agreement and understanding of the plan.

## 2019-11-14 NOTE — ED Triage Notes (Signed)
Swelling, redness and heat to RT lower leg approx 1 week.  Seen by pcp and has completed course of oral abx.

## 2019-11-14 NOTE — ED Provider Notes (Signed)
[facility] Provider Note   CSN: [id number] Arrival date & time: 11/14/19  1501     History Chief Complaint  Patient presents with  . Leg Swelling    KEREK VARGHESE is a 64 y.o. male with past medical history significant for diabetes, erectile dysfunction, hypertension, struct of sleep apnea, bilateral knee replacements presents to emergency department today with chief complaint of worsening right right leg swelling x 2 weeks He is endorsing tenderness to palpation of right lower shin with redness and warmth. He denies any pain or tenderness at rest. No wound or drainage. He denies fever, chills, chest pain, shortness of breath, numbness, weakness, tingling.  Denies recent fall or leg injury.  Patient was seen in emergency department on 11/04/2019 for right leg cellulitis and treated with vancomycin and Rocephin then discharged home on doxycycline.  He followed up with his PCP 3 days later in the leg looks to be worsening so Keflex was added. He states he completed keflex x 3 days ago and still has 1 pill of doxycycline left. He went to pcp today for follow up again and was advised to come to emergency department for further evaluation.  Past Medical History:  Diagnosis Date  . Arthritis of knee    Bilateral  . 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 ED (erectile dysfunction)   . Essential hypertension, benign 1990  . H/O: whooping cough    [place]  . Hemorrhoids, internal   . Obesity   . Obstructive sleep apnea 11/09/2007   NPSG 08/26/04 AHI 92/hr, [place], [place] NPSG 01/31/15- severe OSA AHI 49.4/ hr, CPAP suggested 13, AHI 0.0, weight 294 lbs CPAP Advanced    . Seasonal allergies   . Sleep apnea 2004   wears CPAP nightly      Patient Active Problem List   Diagnosis Date Noted  . Encounter for examination following treatment at hospital 11/07/2019  . Cellulitis of right lower extremity 11/07/2019  . Nonpurulent Cellulitis of right leg  11/04/2019  . Prediabetes 10/09/2019  . Sciatica, right side 11/12/2016  . Piles (hemorrhoids) 06/19/2014  . Dyslipidemia, goal LDL below 100 06/19/2014  . Metabolic syndrome 123456  . Abnormal cardiovascular function study 09/21/2011  . Palpitations 08/24/2011  . Morbid obesity ([place]) 12/02/2007  . Essential hypertension, benign 11/09/2007  . Seasonal allergic rhinitis 11/09/2007    Past Surgical History:  Procedure Laterality Date  . CARDIAC CATHETERIZATION  09/25/2011  . CARPAL TUNNEL RELEASE     Right   . COLONOSCOPY N/A 03/23/2018   Procedure: COLONOSCOPY;  Surgeon: Daneil Dolin, MD;  Location: [facility];  Service: Endoscopy;  Laterality: N/A;  8:00  . HERNIA REPAIR  [id number] approx   umbilical  . JOINT REPLACEMENT  2011   right knee  . JOINT REPLACEMENT  2013   left knee  . left knee replacement   sept 2013  . POLYPECTOMY  03/23/2018   Procedure: POLYPECTOMY;  Surgeon: Daneil Dolin, MD;  Location: [facility];  Service: Endoscopy;;  colon  . Right knee replacement  06/2010  . TOTAL KNEE ARTHROPLASTY  05/18/2012   Procedure: TOTAL KNEE ARTHROPLASTY;  Surgeon: Ninetta Lights, MD;  Location: [facility];  Service: Orthopedics;  Laterality: Left;  left total knee arthroplasty  . UMBILICAL HERNIA REPAIR         Family History  Problem Relation Age of Onset  . Coronary artery disease Brother 70  Premature  . Hypertension Brother   . Hypertension Father   . Cancer Father 11       colon   . Hypertension Mother   . Diabetes Brother   . Hypertension Sister   . Hypertension Sister   . Hypertension Sister   . Hypertension Sister     Social History   Tobacco Use  . Smoking status: Former Smoker    Packs/day: 0.50    Types: Cigarettes  . Smokeless tobacco: Never Used  Substance Use Topics  . Alcohol use: No  . Drug use: No    Home Medications Prior to Admission medications   Medication Sig Start Date End Date Taking? Authorizing Provider  allopurinol  (ZYLOPRIM) 300 MG tablet Take 1 tablet (300 mg total) by mouth daily. 10/13/19  Yes [name], MD  aspirin 81 MG EC tablet TAKE 1 TABLET EVERY DAY Patient taking differently: Take 81 mg by mouth daily.  02/12/14  Yes [name], MD  cholecalciferol (VITAMIN D) 1000 UNITS tablet Take 1,000 Units by mouth daily.     Yes [provider]  doxycycline (VIBRA-TABS) 100 MG tablet Take 1 tablet (100 mg total) by mouth 2 (two) times daily. 11/04/19  Yes Caryl Ada K, PA-C  ELDERBERRY PO Take 1 tablet by mouth daily.   Yes [provider]  fluticasone (FLONASE) 50 MCG/ACT nasal spray Place 2 sprays into both nostrils 2 (two) times daily. Patient taking differently: Place 2 sprays into both nostrils daily as needed for allergies or rhinitis.  06/26/19  Yes [name], MD  Multiple Vitamin (MULTIVITAMIN WITH MINERALS) TABS tablet Take 1 tablet by mouth daily.   Yes [provider]  NIFEdipine (PROCARDIA XL/NIFEDICAL-XL) 90 MG 24 hr tablet TAKE 1 TABLET BY MOUTH EVERY DAY Patient taking differently: Take 90 mg by mouth in the morning.  09/01/19  Yes [name], MD  simvastatin (ZOCOR) 5 MG tablet TAKE 1 TABLET BY MOUTH EVERYDAY AT BEDTIME Patient taking differently: Take 5 mg by mouth every morning.  03/19/19  Yes Perlie Mayo, NP  saccharomyces boulardii (FLORASTOR) 250 MG capsule Take 1 capsule (250 mg total) by mouth 2 (two) times daily. Patient not taking: Reported on 11/14/2019 11/07/19 12/07/19  Perlie Mayo, NP    Allergies    Patient has no known allergies.  Review of Systems   Review of Systems All other systems are reviewed and are negative for acute change except as noted in the HPI.  Physical Exam Updated Vital Signs BP (!) 103/57 (BP Location: Left Arm)   Pulse 60   Temp 98.7 F (37.1 C) (Oral)   Resp 19   Ht 5\' 7"  (1.702 m)   Wt 127.9 kg   SpO2 100%   BMI 44.17 kg/m   Physical Exam Vitals and nursing note reviewed.   Constitutional:      Appearance: He is well-developed. He is not ill-appearing or toxic-appearing.  HENT:     Head: Normocephalic and atraumatic.     Nose: Nose normal.  Eyes:     General: No scleral icterus.       Right eye: No discharge.        Left eye: No discharge.     Conjunctiva/sclera: Conjunctivae normal.  Neck:     Vascular: No JVD.  Cardiovascular:     Rate and Rhythm: Normal rate and regular rhythm.     Pulses: Normal pulses.     Heart sounds: Normal heart sounds.  Pulmonary:     Effort: Pulmonary effort is normal.     Breath sounds: Normal breath sounds.  Abdominal:     General: There is no distension.  Musculoskeletal:        General: Normal range of motion.     Cervical back: Normal range of motion.     Comments: Please see media below.  Right lower extremity is edematous. Neurovascularly intact distally. Compartments soft above and below affected joint.  Erythema and warmth over right distal tibia. Full ROM of right knee, right ankle, able to wiggle toes. No tenderness to palpation of foot.   Skin:    General: Skin is warm and dry.  Neurological:     Mental Status: He is oriented to person, place, and time.     GCS: GCS eye subscore is 4. GCS verbal subscore is 5. GCS motor subscore is 6.     Comments: Fluent speech, no facial droop.  Psychiatric:        Behavior: Behavior normal.       ED Results / Procedures / Treatments   Labs (all labs ordered are listed, but only abnormal results are displayed) Labs Reviewed  BASIC METABOLIC PANEL - Abnormal; Notable for the following components:      Result Value   Potassium 3.3 (*)    Calcium 8.4 (*)    All other components within normal limits  CULTURE, BLOOD (ROUTINE X 2)  CULTURE, BLOOD (ROUTINE X 2)  CBC WITH DIFFERENTIAL/PLATELET  LACTIC ACID, PLASMA    EKG None  Radiology DG Tibia/Fibula Right  Result Date: 11/14/2019 CLINICAL DATA:  Right lower leg infection and swelling. EXAM: RIGHT TIBIA  AND FIBULA - 2 VIEW COMPARISON:  March 26, 2010. FINDINGS: Status post right total knee arthroplasty. No fracture or dislocation is noted. No lytic destruction is seen to suggest osteomyelitis. Small linear metallic density is seen in soft tissues lateral to the midportion of the fibula consistent with retained foreign body. Diffuse soft tissue swelling and edema is noted concerning for possible cellulitis. IMPRESSION: Diffuse soft tissue swelling and edema is noted concerning for cellulitis. Small linear metallic density is seen in soft tissues lateral to the midportion of the fibula consistent with retained foreign body. Electronically Signed   By: Marijo Conception M.D.   On: 11/14/2019 16:13   US Venous Img Lower Right (DVT Study)  Result Date: 11/14/2019 CLINICAL DATA:  64 year old male with right leg swelling x2 weeks. EXAM: Right LOWER EXTREMITY VENOUS DOPPLER ULTRASOUND TECHNIQUE: Gray-scale sonography with compression, as well as color and duplex ultrasound, were performed to evaluate the deep venous system(s) from the level of the common femoral vein through the popliteal and proximal calf veins. COMPARISON:  Ultrasound dated 11/04/2019. FINDINGS: VENOUS Normal compressibility of the common femoral, superficial femoral, and popliteal veins, as well as the visualized calf veins. Visualized portions of profunda femoral vein and great saphenous vein unremarkable. No filling defects to suggest DVT on grayscale or color Doppler imaging. Doppler waveforms show normal direction of venous flow, normal respiratory phasicity and response to augmentation. Limited views of the contralateral common femoral vein are unremarkable. OTHER There is diffuse subcutaneous soft tissue edema of the calf. Limitations: none IMPRESSION: No femoropopliteal DVT nor evidence of DVT within the visualized calf veins. If clinical symptoms are inconsistent or if there are persistent or worsening symptoms, further imaging (possibly  involving the iliac veins) may be warranted. Electronically Signed   By: Anner Crete M.D.   On:  11/14/2019 16:26   CT EXTREMITY LOWER RIGHT W CONTRAST  Result Date: 11/14/2019 CLINICAL DATA:  Lower extremity cellulitis and edema. EXAM: CT OF THE LOWER RIGHT EXTREMITY WITH CONTRAST TECHNIQUE: Multidetector CT imaging of the lower right extremity was performed according to the standard protocol following intravenous contrast administration. COMPARISON:  Radiographs dated 11/14/2019 CONTRAST:  59mL OMNIPAQUE IOHEXOL 300 MG/ML  SOLN FINDINGS: Bones/Joint/Cartilage No significant bone abnormality. Right total knee prosthesis. No ankle effusion. Muscles and Tendons The muscles and tendons of the right lower leg appear normal. Soft tissues There circumferential subcutaneous edema throughout the lower leg extending onto the dorsum of the foot. There is no discrete abscess. No appreciable myositis. IMPRESSION: 1. Diffuse subcutaneous edema throughout the right lower leg extending onto the dorsum of the foot consistent with cellulitis. 2. No discrete abscess. 3. No evidence of myositis or osteomyelitis. Electronically Signed   By: Lorriane Shire M.D.   On: 11/14/2019 19:20    Procedures Procedures (including critical care time)  Medications Ordered in ED Medications  iohexol (OMNIPAQUE) 300 MG/ML solution 75 mL (75 mLs Intravenous Contrast Given 11/14/19 1838)    ED Course  I have reviewed the triage vital signs and the nursing notes.  Pertinent labs & imaging results that were available during my care of the patient were reviewed by me and considered in my medical decision making (see chart for details).    MDM Rules/Calculators/A&P                      Patient seen and examined. Patient presents awake, alert, hemodynamically stable, afebrile, non toxic.  No tachycardia or hypoxia.  Lungs are clear to auscultation all fields.  Right lower extremity is impressively swollen from the knee down.  There  is erythema over right distal tib-fib and tenderness to palpation.  No open or healing wounds noted.  Full range of motion of right knee and ankle.  CBC without leukocytosis, no anemia. BMP without severe electrolyte derangement, no renal insufficiency.  Lactic acid is normal. Blood cultures pending. DVT study is negative. Xray of right tib fib viewed by me shows soft tissue swelling and edema is noted concerning for cellulitis. There is also a small linear metallic density is seen in soft tissues lateral to the midportion of the fibula, ?retained foreign body.  Ct right lower extremity is consistent with findings of diffuse cellulitis throughout right leg extending into dorsum. No obvious abscess or evidence of myositis or osteo. Given his cellulitis has gotten worse instead of better with PO doxycyline and keflex feel that admission is warranted for IV antibiotics. This case was discussed with Dr. Eulis Foster who has seen the patient and agrees with plan to admit. IV vancomycin started. Spoke with Dr. Scherrie November with hospitalist service who agrees to assume care of patient and bring into the hospital for further evaluation and management.     Portions of this note were generated with [employer]. Dictation errors may occur despite best attempts at proofreading.   Final Clinical Impression(s) / ED Diagnoses Final diagnoses:  Cellulitis of right lower extremity    Rx / DC Orders ED Discharge Orders    None       Flint Melter 11/14/19 2001    Daleen Bo, MD 11/14/19 2138

## 2019-11-14 NOTE — Patient Instructions (Signed)
I appreciate the opportunity to provide you with care for your health and wellness. Today we discussed: leg infection  Follow up: 03/11/2020  No labs or referrals today  Please go to the ED for further eval of leg infection  Please continue to practice social distancing to keep you, your family, and our community safe.  If you must go out, please wear a mask and practice good handwashing.  It was a pleasure to see you and I look forward to continuing to work together on your health and well-being. Please do not hesitate to call the office if you need care or have questions about your care.  Have a wonderful day and week. With Gratitude, Cherly Beach, DNP, AGNP-BC

## 2019-11-15 ENCOUNTER — Ambulatory Visit: Payer: Medicare Other | Admitting: Family Medicine

## 2019-11-15 DIAGNOSIS — L03115 Cellulitis of right lower limb: Principal | ICD-10-CM

## 2019-11-15 DIAGNOSIS — E785 Hyperlipidemia, unspecified: Secondary | ICD-10-CM

## 2019-11-15 DIAGNOSIS — I10 Essential (primary) hypertension: Secondary | ICD-10-CM

## 2019-11-15 LAB — CBC
HCT: 37.2 % — ABNORMAL LOW (ref 39.0–52.0)
Hemoglobin: 12.4 g/dL — ABNORMAL LOW (ref 13.0–17.0)
MCH: 30.8 pg (ref 26.0–34.0)
MCHC: 33.3 g/dL (ref 30.0–36.0)
MCV: 92.3 fL (ref 80.0–100.0)
Platelets: 336 10*3/uL (ref 150–400)
RBC: 4.03 MIL/uL — ABNORMAL LOW (ref 4.22–5.81)
RDW: 12.9 % (ref 11.5–15.5)
WBC: 4.3 10*3/uL (ref 4.0–10.5)
nRBC: 0 % (ref 0.0–0.2)

## 2019-11-15 LAB — SEDIMENTATION RATE: Sed Rate: 44 mm/hr — ABNORMAL HIGH (ref 0–16)

## 2019-11-15 LAB — HIV ANTIBODY (ROUTINE TESTING W REFLEX): HIV Screen 4th Generation wRfx: NONREACTIVE

## 2019-11-15 LAB — BASIC METABOLIC PANEL
Anion gap: 8 (ref 5–15)
BUN: 14 mg/dL (ref 8–23)
CO2: 26 mmol/L (ref 22–32)
Calcium: 8.3 mg/dL — ABNORMAL LOW (ref 8.9–10.3)
Chloride: 103 mmol/L (ref 98–111)
Creatinine, Ser: 1.02 mg/dL (ref 0.61–1.24)
GFR calc Af Amer: >60 mL/min (ref >60)
GFR calc non Af Amer: >60 mL/min (ref >60)
Glucose, Bld: 132 mg/dL — ABNORMAL HIGH (ref 70–99)
Potassium: 3.2 mmol/L — ABNORMAL LOW (ref 3.5–5.1)
Sodium: 137 mmol/L (ref 135–145)

## 2019-11-15 LAB — SARS CORONAVIRUS 2 (TAT 6-24 HRS): SARS Coronavirus 2: NEGATIVE

## 2019-11-15 LAB — GLUCOSE, CAPILLARY
Glucose-Capillary: 101 mg/dL — ABNORMAL HIGH (ref 70–99)
Glucose-Capillary: 124 mg/dL — ABNORMAL HIGH (ref 70–99)
Glucose-Capillary: 99 mg/dL (ref 70–99)
Glucose-Capillary: 122 mg/dL — ABNORMAL HIGH (ref 70–99)

## 2019-11-15 LAB — PROCALCITONIN: Procalcitonin: <0.1 ng/mL

## 2019-11-15 LAB — MAGNESIUM: Magnesium: 1.8 mg/dL (ref 1.7–2.4)

## 2019-11-15 MED ORDER — POTASSIUM CHLORIDE CRYS ER 20 MEQ PO TBCR
40.0000 meq | EXTENDED_RELEASE_TABLET | ORAL | Status: AC
  Administered 2019-11-15 (×2): 40 meq via ORAL
  Filled 2019-11-15 (×2): qty 2

## 2019-11-15 MED ORDER — FUROSEMIDE 10 MG/ML IJ SOLN
20.0000 mg | Freq: Once | INTRAMUSCULAR | Status: AC
  Administered 2019-11-15: 20 mg via INTRAVENOUS
  Filled 2019-11-15: qty 2

## 2019-11-15 MED ORDER — FUROSEMIDE 10 MG/ML IJ SOLN
20.0000 mg | Freq: Once | INTRAMUSCULAR | Status: AC
  Administered 2019-11-16: 20 mg via INTRAVENOUS
  Filled 2019-11-15: qty 2

## 2019-11-15 NOTE — Progress Notes (Signed)
PROGRESS NOTE    Christopher Lambert  [id number] DOB: 1956-08-06 DOA: 11/14/2019 PCP: Christopher Helper, MD     Brief Narrative:  As per H&P written by Dr. Scherrie Lambert on 11/14/19 64 y.o. male with medical history significant of hypertension, hyperlipidemia, diabetes mellitus type 2, morbid obesity, gout, obstructive sleep apnea who presented to the ER with worsening right lower extremity swelling.  Patient was initially seen in the ER on 11/04/2019 with worsening right lower extremity swelling and was thought to have cellulitis.  He received 1 dose of Rocephin and vancomycin and was discharged home on doxycycline.  He subsequently had a follow-up as outpatient on 11/07/2019 and was given additional coverage with Keflex due to persistent symptoms.  Patient subsequently had another follow-up today as outpatient with his family medicine NP and was noted to have no improvement in symptoms and sent back to the ER for IV antibiotics.  He has had no discharge from the right lower extremity, no fever, chills. No chest pain, shortness of breath, palpitations, cough. No nausea, vomiting, abdominal pain, dysuria, diarrhea. No syncope, seizures, dizziness, lightheadedness, focal deficits, visual deficits, neck stiffness, headache. ED Course:  Vital Signs reviewed on presentation, significant for temperature 98.7, heart rate 60, blood pressure 103/57, saturation 100% on room air. Labs reviewed, significant for sodium 136, potassium 3.3, BUN 17, creatinine 1.05, lactic acid 1.3, WBC count 4.7, hemoglobin 13.3, hematocrit 39, platelets 326.  Blood cultures have been sent. Imaging personally Reviewed, Right lower venous ultrasound shows no evidence of DVT.  CT of the right lower extremity shows diffuse subcutaneous edema throughout the right lower leg consistent with cellulitis.  No evidence of abscess, subcutaneous gas, osteomyelitis.   Assessment & Plan: 1-nonpurulent Cellulitis of right leg -Patient with improvement  in his erythematous changes, warmth sensation, swelling and pain -No fever, no WBCs elevation and normal procalcitonin -Examination suggesting venous insufficiency and lymphedema currently -Will discontinue antibiotics and assess response.  Patient has been treated for over 10 days on different antibiotic therapy and presentation/overnight improvement by use leg elevation suggesting no bacterial infection as a source of his problem. -Apply TED hoses -Lasix IV x1 and low-sodium diet -Follow improvement -Most likely home in a.m if he remains stable.  2-Morbid obesity (HCC) -Body mass index is 44.47 kg/m. -Low calorie diet, portion control and increase physical activity discussed with patient.  3-Essential hypertension, benign -Overall stable, to mildly increased -Follow vital signs, probably benefit of being discharge on daily diuretics. -Heart healthy diet has been emphasized. -Continue nifedipine.  4-Dyslipidemia, goal LDL below 100 -Continue simvastatin  5-history of gout -Continue allopurinol -No acute flare appreciated.  6-type 2 diabetes mellitus -Following modified carbohydrate diet only as an outpatient -Continue sliding scale insulin and follow CBGs fluctuation while inpatient.  7-OSA -Continue nightly CPAP.   DVT prophylaxis: Lovenox Code Status: Full code Family Communication: Wife at bedside Disposition Plan: Discontinue antibiotics and assess response; start TED hose 12 hours on and 12 hours off; continue aiming for leg elevation as much as possible and provide IV Lasix.  Hopefully discharge in the next 24 hours.  Consultants:   None  Procedures:   See below for x-ray reports.  Antimicrobials:  Anti-infectives (From admission, onward)   Start     Dose/Rate Route Frequency Ordered Stop   11/15/19 0800  vancomycin (VANCOCIN) IVPB 1000 mg/200 mL premix  Status:  Discontinued     1,000 mg 200 mL/hr over 60 Minutes Intravenous Every 12 hours 11/14/19 1942  11/14/19 2016  11/14/19 2200  clindamycin (CLEOCIN) IVPB 600 mg  Status:  Discontinued     600 mg 100 mL/hr over 30 Minutes Intravenous Every 8 hours 11/14/19 2010 11/14/19 2023   11/14/19 2100  clindamycin (CLEOCIN) IVPB 900 mg     900 mg 100 mL/hr over 30 Minutes Intravenous Every 8 hours 11/14/19 2023     11/14/19 1945  vancomycin (VANCOREADY) IVPB 2000 mg/400 mL  Status:  Discontinued     2,000 mg 200 mL/hr over 120 Minutes Intravenous  Once 11/14/19 1932 11/14/19 2210       Subjective: Afebrile, no chest pain, no nausea, no vomiting.  Reports improvement in his right lower extremity swelling and pain.  Objective: Vitals:   11/14/19 2008 11/14/19 2115 11/14/19 2231 11/15/19 0600  BP:  (!) 152/85  116/68  Pulse:  90  69  Resp:  18  16  Temp:  99.1 F (37.3 C)  98.7 F (37.1 C)  TempSrc:  Oral  Oral  SpO2: 97% 97% 97% 98%  Weight:    128.8 kg  Height:        Intake/Output Summary (Last 24 hours) at 11/15/2019 1302 Last data filed at 11/15/2019 0800 Gross per 24 hour  Intake 561.27 ml  Output --  Net 561.27 ml   Filed Weights   11/14/19 1508 11/15/19 0600  Weight: 127.9 kg 128.8 kg    Examination: General exam: Alert, awake, oriented x 3; no chest pain, no nausea, no vomiting.  Patient is vastly overweight on examination. Respiratory system: Clear to auscultation. Respiratory effort normal.  No using accessory muscles. Cardiovascular system:RRR. No murmurs, rubs, gallops. Gastrointestinal system: Abdomen is nondistended, soft and nontender. No organomegaly or masses felt. Normal bowel sounds heard. Central nervous system: Alert and oriented. No focal neurological deficits. Extremities/skin: No cyanosis or clubbing; bilateral lower extremity swelling (right more than left); very little erythematous changes and also some warm sensation to touch in the anterior aspect of his right mid leg.  No open wounds. Psychiatry: Judgement and insight appear normal. Mood & affect  appropriate.    Data Reviewed: I have personally reviewed following labs and imaging studies  CBC: Recent Labs  Lab 11/14/19 1535 11/15/19 0519  WBC 4.7 4.3  NEUTROABS 2.3  --   HGB 13.3 12.4*  HCT 39.6 37.2*  MCV 92.5 92.3  PLT 326 [id number]   Basic Metabolic Panel: Recent Labs  Lab 11/14/19 1535 11/15/19 0519  NA 136 137  K 3.3* 3.2*  CL 101 103  CO2 27 26  GLUCOSE 87 132*  BUN 17 14  CREATININE 1.05 1.02  CALCIUM 8.4* 8.3*   GFR: Estimated Creatinine Clearance: 96.9 mL/min (by C-G formula based on SCr of 1.02 mg/dL).  CBG: Recent Labs  Lab 11/14/19 2135 11/15/19 0834 11/15/19 1143  GLUCAP 148* 101* 124*   Urine analysis:    Component Value Date/Time   COLORURINE AMBER (A) 04/16/2019 1642   APPEARANCEUR CLOUDY (A) 04/16/2019 1642   LABSPEC 1.019 04/16/2019 1642   PHURINE 5.0 04/16/2019 1642   GLUCOSEU NEGATIVE 04/16/2019 1642   HGBUR LARGE (A) 04/16/2019 1642   BILIRUBINUR NEGATIVE 04/16/2019 1642   BILIRUBINUR small 06/22/2016 1310   KETONESUR NEGATIVE 04/16/2019 1642   PROTEINUR 30 (A) 04/16/2019 1642   UROBILINOGEN 1.0 06/22/2016 1310   UROBILINOGEN 0.2 05/12/2012 0901   NITRITE NEGATIVE 04/16/2019 1642   LEUKOCYTESUR MODERATE (A) 04/16/2019 1642    Recent Results (from the past 240 hour(s))  Culture, blood (routine x 2)  Status: None (Preliminary result)   Collection Time: 11/14/19  3:46 PM   Specimen: BLOOD RIGHT FOREARM  Result Value Ref Range Status   Specimen Description BLOOD RIGHT FOREARM  Final   Special Requests   Final    BOTTLES DRAWN AEROBIC AND ANAEROBIC Blood Culture adequate volume   Culture   Final    NO GROWTH < 24 HOURS Performed at [facility], [address], [place], [place] [address]    Report Status PENDING  Incomplete  Culture, blood (routine x 2)     Status: None (Preliminary result)   Collection Time: 11/14/19  4:43 PM   Specimen: Left Antecubital; Blood  Result Value Ref Range Status   Specimen Description LEFT  ANTECUBITAL  Final   Special Requests   Final    BOTTLES DRAWN AEROBIC AND ANAEROBIC Blood Culture adequate volume   Culture   Final    NO GROWTH < 24 HOURS Performed at [facility], [address], [place], [place] [address]    Report Status PENDING  Incomplete  SARS CORONAVIRUS 2 (TAT 6-24 HRS) Nasopharyngeal Nasopharyngeal Swab     Status: None   Collection Time: 11/14/19  8:03 PM   Specimen: Nasopharyngeal Swab  Result Value Ref Range Status   SARS Coronavirus 2 NEGATIVE NEGATIVE Final    Comment: (NOTE) SARS-CoV-2 target nucleic acids are NOT DETECTED. The SARS-CoV-2 RNA is generally detectable in upper and lower respiratory specimens during the acute phase of infection. Negative results do not preclude SARS-CoV-2 infection, do not rule out co-infections with other pathogens, and should not be used as the sole basis for treatment or other patient management decisions. Negative results must be combined with clinical observations, patient history, and epidemiological information. The expected result is Negative. Fact Sheet for Patients: SugarRoll.be Fact Sheet for Healthcare Providers: https://www.woods-mathews.com/ This test is not yet approved or cleared by the [place] FDA and  has been authorized for detection and/or diagnosis of SARS-CoV-2 by FDA under an Emergency Use Authorization (EUA). This EUA will remain  in effect (meaning this test can be used) for the duration of the COVID-19 declaration under Section 56 4(b)(1) of the Act, 21 U.S.C. section 360bbb-3(b)(1), unless the authorization is terminated or revoked sooner. Performed at [facility], [place] [address], [place], [place] [address]      Radiology Studies: DG Tibia/Fibula Right  Result Date: 11/14/2019 CLINICAL DATA:  Right lower leg infection and swelling. EXAM: RIGHT TIBIA AND FIBULA - 2 VIEW COMPARISON:  March 26, 2010. FINDINGS: Status post right total  knee arthroplasty. No fracture or dislocation is noted. No lytic destruction is seen to suggest osteomyelitis. Small linear metallic density is seen in soft tissues lateral to the midportion of the fibula consistent with retained foreign body. Diffuse soft tissue swelling and edema is noted concerning for possible cellulitis. IMPRESSION: Diffuse soft tissue swelling and edema is noted concerning for cellulitis. Small linear metallic density is seen in soft tissues lateral to the midportion of the fibula consistent with retained foreign body. Electronically Signed   By: Marijo Conception M.D.   On: 11/14/2019 16:13   US Venous Img Lower Right (DVT Study)  Result Date: 11/14/2019 CLINICAL DATA:  63 year old male with right leg swelling x2 weeks. EXAM: Right LOWER EXTREMITY VENOUS DOPPLER ULTRASOUND TECHNIQUE: Gray-scale sonography with compression, as well as color and duplex ultrasound, were performed to evaluate the deep venous system(s) from the level of the common femoral vein through the popliteal and proximal  calf veins. COMPARISON:  Ultrasound dated 11/04/2019. FINDINGS: VENOUS Normal compressibility of the common femoral, superficial femoral, and popliteal veins, as well as the visualized calf veins. Visualized portions of profunda femoral vein and great saphenous vein unremarkable. No filling defects to suggest DVT on grayscale or color Doppler imaging. Doppler waveforms show normal direction of venous flow, normal respiratory phasicity and response to augmentation. Limited views of the contralateral common femoral vein are unremarkable. OTHER There is diffuse subcutaneous soft tissue edema of the calf. Limitations: none IMPRESSION: No femoropopliteal DVT nor evidence of DVT within the visualized calf veins. If clinical symptoms are inconsistent or if there are persistent or worsening symptoms, further imaging (possibly involving the iliac veins) may be warranted. Electronically Signed   By: Anner Crete  M.D.   On: 11/14/2019 16:26   CT EXTREMITY LOWER RIGHT W CONTRAST  Result Date: 11/14/2019 CLINICAL DATA:  Lower extremity cellulitis and edema. EXAM: CT OF THE LOWER RIGHT EXTREMITY WITH CONTRAST TECHNIQUE: Multidetector CT imaging of the lower right extremity was performed according to the standard protocol following intravenous contrast administration. COMPARISON:  Radiographs dated 11/14/2019 CONTRAST:  43mL OMNIPAQUE IOHEXOL 300 MG/ML  SOLN FINDINGS: Bones/Joint/Cartilage No significant bone abnormality. Right total knee prosthesis. No ankle effusion. Muscles and Tendons The muscles and tendons of the right lower leg appear normal. Soft tissues There circumferential subcutaneous edema throughout the lower leg extending onto the dorsum of the foot. There is no discrete abscess. No appreciable myositis. IMPRESSION: 1. Diffuse subcutaneous edema throughout the right lower leg extending onto the dorsum of the foot consistent with cellulitis. 2. No discrete abscess. 3. No evidence of myositis or osteomyelitis. Electronically Signed   By: Lorriane Shire M.D.   On: 11/14/2019 19:20    Scheduled Meds: . allopurinol  300 mg Oral Daily  . aspirin EC  81 mg Oral Daily  . enoxaparin (LOVENOX) injection  0.5 mg/kg Subcutaneous Q24H  . furosemide  20 mg Intravenous Once  . insulin aspart  0-5 Units Subcutaneous QHS  . insulin aspart  0-9 Units Subcutaneous TID WC  . multivitamin with minerals  1 tablet Oral Daily  . NIFEdipine  60 mg Oral q AM  . potassium chloride  40 mEq Oral Q4H  . simvastatin  5 mg Oral q morning - 10a  . sodium chloride flush  3 mL Intravenous Q12H  . cholecalciferol  1,000 Units Oral Daily   Continuous Infusions: . sodium chloride    . clindamycin (CLEOCIN) IV 900 mg (11/15/19 0602)     LOS: 1 day    Time spent: 30 minutes.   Barton Dubois, MD [facility] Pager [phone]   11/15/2019, 1:02 PM

## 2019-11-15 NOTE — Progress Notes (Signed)
Patient has CPAP unit at bedside and places self on when ready. RT filled water chamber with sterile water and informed patient if he has any trouble to have RN contact RT.

## 2019-11-16 LAB — GLUCOSE, CAPILLARY
Glucose-Capillary: 113 mg/dL — ABNORMAL HIGH (ref 70–99)
Glucose-Capillary: 115 mg/dL — ABNORMAL HIGH (ref 70–99)

## 2019-11-16 LAB — BASIC METABOLIC PANEL
Anion gap: 9 (ref 5–15)
BUN: 14 mg/dL (ref 8–23)
CO2: 26 mmol/L (ref 22–32)
Calcium: 8.4 mg/dL — ABNORMAL LOW (ref 8.9–10.3)
Chloride: 104 mmol/L (ref 98–111)
Creatinine, Ser: 1.01 mg/dL (ref 0.61–1.24)
GFR calc Af Amer: >60 mL/min (ref >60)
GFR calc non Af Amer: >60 mL/min (ref >60)
Glucose, Bld: 121 mg/dL — ABNORMAL HIGH (ref 70–99)
Potassium: 3.6 mmol/L (ref 3.5–5.1)
Sodium: 139 mmol/L (ref 135–145)

## 2019-11-16 MED ORDER — FUROSEMIDE 20 MG PO TABS: 20.0000 mg | ORAL_TABLET | Freq: Every day | ORAL | 2 refills | Status: DC

## 2019-11-16 MED ORDER — POTASSIUM CHLORIDE ER 10 MEQ PO TBCR: 20.0000 meq | EXTENDED_RELEASE_TABLET | Freq: Every day | ORAL | 0 refills | Status: DC

## 2019-11-16 NOTE — Discharge Summary (Signed)
Physician Discharge Summary  Christopher Lambert [id number] DOB: [birth year] DOA: 11/14/2019  PCP: [name], MD  Admit date: 11/14/2019 Discharge date: 11/16/2019  Time spent: 35 minutes  Recommendations for Outpatient Follow-up:  1. Repeat basic metabolic panel to follow electrolytes and renal function 2. Reassess volume status and blood pressure with further adjustment medication as required. 3. Thank you patient has follow-up as instructed with the use of compression stockings and if needed make referral to lymphedema clinic for [facility].   Discharge Diagnoses:  Principal Problem:   Nonpurulent Cellulitis of right leg Active Problems:   Morbid obesity ([place])   Essential hypertension, benign   Dyslipidemia, goal LDL below 100   Cellulitis and abscess of right leg   Discharge Condition: Stable and improved.  Patient discharged home with instructions to follow-up with PCP in 1 week.  CODE STATUS: Full code.  Diet recommendation: Modified carbohydrate, low calorie and heart healthy/low-sodium diet.  Filed Weights   11/14/19 1508 11/15/19 0600 11/16/19 0500  Weight: 127.9 kg 128.8 kg 125.8 kg    History of present illness:  As per H&P written by Dr. Scherrie November on 11/14/19 63 y.o.malewith medical history significant ofhypertension, hyperlipidemia, diabetes mellitus type 2, morbid obesity, gout, obstructive sleep apnea who presented to the ER with worsening right lower extremity swelling. Patient was initially seen in the ER on 11/04/2019 with worsening right lower extremity swelling and was thought to have cellulitis. He received 1 dose of Rocephin and vancomycin and was discharged home on doxycycline. He subsequently had a follow-up as outpatient on 11/07/2019 and was given additional coverage with Keflex due to persistent symptoms. Patient subsequently had another follow-up today as outpatient with his family medicine NP and was noted to have no improvement in symptoms  and sent back to the ER for IV antibiotics. He has had no discharge from the right lower extremity, no fever, chills. No chest pain, shortness of breath, palpitations, cough. No nausea, vomiting, abdominal pain, dysuria, diarrhea. No syncope, seizures, dizziness, lightheadedness, focal deficits, visual deficits, neck stiffness, headache.  ED Course: Vital Signs reviewed on presentation, significant fortemperature 98.7, heart rate 60, blood pressure 103/57, saturation 100% on room air. Labs reviewed, significant forsodium 136, potassium 3.3, BUN 17, creatinine 1.05, lactic acid 1.3, WBC count 4.7, hemoglobin 13.3, hematocrit 39, platelets 326. Blood cultures have been sent. Imaging personally Reviewed,Right lower venous ultrasound shows no evidence of DVT. CT of the right lower extremity shows diffuse subcutaneous edema throughout the right lower leg consistent with cellulitis. No evidence of abscess, subcutaneous gas, osteomyelitis.  Hospital Course:  1-nonpurulent Cellulitis of right leg -Patient with improvement in his erythematous changes, warmth sensation, swelling and pain -No fever, no WBCs elevation and normal procalcitonin -Examination suggesting venous insufficiency and lymphedema currently -Will discontinue antibiotics and assess response.  Patient has been treated for over 10 days on different antibiotic therapy and presentation/overnight improvement by leg elevation suggesting no bacterial infection as a source of his problem. -continue TED hoses.compression stocking -Lasix IV x1 and discharge on oral lasix daily.  2-Morbid obesity (HCC) -Body mass index is 44.47 kg/m. -Low calorie diet, portion control and increase physical activity discussed with patient.  3-Essential hypertension, benign -Overall stable -Follow vital signs, probably benefit of being discharge on daily diuretics. -Heart healthy diet has been emphasized. -Continue nifedipine and started on low dose  lasix for better control and volume managment.  4-Dyslipidemia, goal LDL below 100 -Continue simvastatin -repeat lipid panel and LFT's as an outpatient.  5-history of gout -Continue allopurinol -No acute flare appreciated.  6-type 2 diabetes mellitus -Following modified carbohydrate diet only as an outpatient -Continue sliding scale insulin and follow CBGs fluctuation while inpatient. -A1C 6.4  7-OSA -Continue nightly CPAP.   Procedures:  See below for x-ray reports.  Consultations:  None  Discharge Exam: Vitals:   11/15/19 2146 11/16/19 0607  BP: 114/85 120/76  Pulse: 71 68  Resp: 18 16  Temp: 99.3 F (37.4 C) 98.1 F (36.7 C)  SpO2: 99% 99%    General: Afebrile, no chest pain, no nausea, no vomiting.  Reports significant improvement in his right lower extremity swelling and pain. Cardiovascular: S1 and S2, no rubs, no gallops, no murmurs, no JVD. Respiratory: Clear to auscultation bilaterally; normal respiratory effort and good oxygen saturation on room air. Abdomen: Obese, soft, nontender, distended, positive bowel sounds. Extremities/skin: No cyanosis or clubbing; bilateral lower extremity swelling (right more than left appreciated).  Overall significant improvement seen with very little erythematous changes and no warmth sensation to touch.  Patient also expressed significant improvement in pain.  No open wounds, no drainage, no other abnormalities or complaints appreciated.  Discharge Instructions   Discharge Instructions    Diet - low sodium heart healthy   Complete by: As directed    Discharge instructions   Complete by: As directed    Follow low-sodium diet Maintain adequate hydration Keep leg elevated as much as possible Please follow the use of TED hoses as large compression stockings Arrange follow-up with PCP in 1 week. Check your weight on daily basis and make sure to follow a low calorie diet with increased in your physical activity.      Allergies as of 11/16/2019   No Known Allergies     Medication List    STOP taking these medications   doxycycline 100 MG tablet Commonly known as: VIBRA-TABS   saccharomyces boulardii 250 MG capsule Commonly known as: FLORASTOR     TAKE these medications   allopurinol 300 MG tablet Commonly known as: ZYLOPRIM Take 1 tablet (300 mg total) by mouth daily.   aspirin 81 MG EC tablet TAKE 1 TABLET EVERY DAY   cholecalciferol 1000 units tablet Commonly known as: VITAMIN D Take 1,000 Units by mouth daily.   ELDERBERRY PO Take 1 tablet by mouth daily.   fluticasone 50 MCG/ACT nasal spray Commonly known as: FLONASE Place 2 sprays into both nostrils 2 (two) times daily. What changed:   when to take this  reasons to take this   furosemide 20 MG tablet Commonly known as: Lasix Take 1 tablet (20 mg total) by mouth daily.   multivitamin with minerals Tabs tablet Take 1 tablet by mouth daily.   NIFEdipine 90 MG 24 hr tablet Commonly known as: PROCARDIA XL/NIFEDICAL-XL TAKE 1 TABLET BY MOUTH EVERY DAY What changed: when to take this   potassium chloride 10 MEQ tablet Commonly known as: KLOR-CON Take 2 tablets (20 mEq total) by mouth daily.   simvastatin 5 MG tablet Commonly known as: ZOCOR TAKE 1 TABLET BY MOUTH EVERYDAY AT BEDTIME What changed: See the new instructions.      No Known Allergies Follow-up Information    [name], MD. Schedule an appointment as soon as possible for a visit in 1 week(s).   Specialty: Family Medicine Contact information: [address], [place]  [address] [phone]           The results of significant diagnostics from this hospitalization (including  imaging, microbiology, ancillary and laboratory) are listed below for reference.    Significant Diagnostic Studies: DG Tibia/Fibula Right  Result Date: 11/14/2019 CLINICAL DATA:  Right lower leg infection and swelling. EXAM: RIGHT TIBIA AND FIBULA -  2 VIEW COMPARISON:  March 26, 2010. FINDINGS: Status post right total knee arthroplasty. No fracture or dislocation is noted. No lytic destruction is seen to suggest osteomyelitis. Small linear metallic density is seen in soft tissues lateral to the midportion of the fibula consistent with retained foreign body. Diffuse soft tissue swelling and edema is noted concerning for possible cellulitis. IMPRESSION: Diffuse soft tissue swelling and edema is noted concerning for cellulitis. Small linear metallic density is seen in soft tissues lateral to the midportion of the fibula consistent with retained foreign body. Electronically Signed   By: Marijo Conception M.D.   On: 11/14/2019 16:13   US Venous Img Lower Right (DVT Study)  Result Date: 11/14/2019 CLINICAL DATA:  64 year old male with right leg swelling x2 weeks. EXAM: Right LOWER EXTREMITY VENOUS DOPPLER ULTRASOUND TECHNIQUE: Gray-scale sonography with compression, as well as color and duplex ultrasound, were performed to evaluate the deep venous system(s) from the level of the common femoral vein through the popliteal and proximal calf veins. COMPARISON:  Ultrasound dated 11/04/2019. FINDINGS: VENOUS Normal compressibility of the common femoral, superficial femoral, and popliteal veins, as well as the visualized calf veins. Visualized portions of profunda femoral vein and great saphenous vein unremarkable. No filling defects to suggest DVT on grayscale or color Doppler imaging. Doppler waveforms show normal direction of venous flow, normal respiratory phasicity and response to augmentation. Limited views of the contralateral common femoral vein are unremarkable. OTHER There is diffuse subcutaneous soft tissue edema of the calf. Limitations: none IMPRESSION: No femoropopliteal DVT nor evidence of DVT within the visualized calf veins. If clinical symptoms are inconsistent or if there are persistent or worsening symptoms, further imaging (possibly involving the iliac  veins) may be warranted. Electronically Signed   By: Anner Crete M.D.   On: 11/14/2019 16:26   US Venous Img Lower Unilateral Right (DVT)  Result Date: 11/04/2019 CLINICAL DATA:  Right lower extremity pain and swelling. EXAM: RIGHT LOWER EXTREMITY VENOUS DOPPLER ULTRASOUND TECHNIQUE: Gray-scale sonography with compression, as well as color and duplex ultrasound, were performed to evaluate the deep venous system(s) from the level of the common femoral vein through the popliteal and proximal calf veins. COMPARISON:  CT abdomen/pelvis 04/17/2019 FINDINGS: VENOUS Normal compressibility of the common femoral, superficial femoral, and popliteal veins, as well as the visualized calf veins. Visualized portions of profunda femoral vein and great saphenous vein unremarkable. No filling defects to suggest DVT on grayscale or color Doppler imaging. Doppler waveforms show normal direction of venous flow, normal respiratory phasicity and response to augmentation. Limited views of the contralateral common femoral vein are unremarkable. OTHER Prominent right inguinal lymph node measuring 6 cm in greatest diameter with cortical thickness of 6 mm. Limitations: none IMPRESSION: 1. No femoropopliteal DVT nor evidence of DVT within the visualized calf veins. If clinical symptoms are inconsistent or if there are persistent or worsening symptoms, further imaging (possibly involving the iliac veins) may be warranted. 2. Right inguinal adenopathy with largest node measuring 6 cm having cortical thickness of 6 mm. Recommend clinical correlation. Electronically Signed   By: Marin Olp M.D.   On: 11/04/2019 10:26   CT EXTREMITY LOWER RIGHT W CONTRAST  Result Date: 11/14/2019 CLINICAL DATA:  Lower extremity cellulitis and edema. EXAM: CT  OF THE LOWER RIGHT EXTREMITY WITH CONTRAST TECHNIQUE: Multidetector CT imaging of the lower right extremity was performed according to the standard protocol following intravenous contrast  administration. COMPARISON:  Radiographs dated 11/14/2019 CONTRAST:  31mL OMNIPAQUE IOHEXOL 300 MG/ML  SOLN FINDINGS: Bones/Joint/Cartilage No significant bone abnormality. Right total knee prosthesis. No ankle effusion. Muscles and Tendons The muscles and tendons of the right lower leg appear normal. Soft tissues There circumferential subcutaneous edema throughout the lower leg extending onto the dorsum of the foot. There is no discrete abscess. No appreciable myositis. IMPRESSION: 1. Diffuse subcutaneous edema throughout the right lower leg extending onto the dorsum of the foot consistent with cellulitis. 2. No discrete abscess. 3. No evidence of myositis or osteomyelitis. Electronically Signed   By: Lorriane Shire M.D.   On: 11/14/2019 19:20    Microbiology: Recent Results (from the past 240 hour(s))  Culture, blood (routine x 2)     Status: None (Preliminary result)   Collection Time: 11/14/19  3:46 PM   Specimen: BLOOD RIGHT FOREARM  Result Value Ref Range Status   Specimen Description BLOOD RIGHT FOREARM  Final   Special Requests   Final    BOTTLES DRAWN AEROBIC AND ANAEROBIC Blood Culture adequate volume   Culture   Final    NO GROWTH 2 DAYS Performed at [facility], [address], [place], [place] [address]    Report Status PENDING  Incomplete  Culture, blood (routine x 2)     Status: None (Preliminary result)   Collection Time: 11/14/19  4:43 PM   Specimen: Left Antecubital; Blood  Result Value Ref Range Status   Specimen Description LEFT ANTECUBITAL  Final   Special Requests   Final    BOTTLES DRAWN AEROBIC AND ANAEROBIC Blood Culture adequate volume   Culture   Final    NO GROWTH 2 DAYS Performed at [facility], [address] [place], [place] [address]    Report Status PENDING  Incomplete  SARS CORONAVIRUS 2 (TAT 6-24 HRS) Nasopharyngeal Nasopharyngeal Swab     Status: None   Collection Time: 11/14/19  8:03 PM   Specimen: Nasopharyngeal Swab  Result Value Ref Range  Status   SARS Coronavirus 2 NEGATIVE NEGATIVE Final    Comment: (NOTE) SARS-CoV-2 target nucleic acids are NOT DETECTED. The SARS-CoV-2 RNA is generally detectable in upper and lower respiratory specimens during the acute phase of infection. Negative results do not preclude SARS-CoV-2 infection, do not rule out co-infections with other pathogens, and should not be used as the sole basis for treatment or other patient management decisions. Negative results must be combined with clinical observations, patient history, and epidemiological information. The expected result is Negative. Fact Sheet for Patients: SugarRoll.be Fact Sheet for Healthcare Providers: https://www.woods-mathews.com/ This test is not yet approved or cleared by the [place] FDA and  has been authorized for detection and/or diagnosis of SARS-CoV-2 by FDA under an Emergency Use Authorization (EUA). This EUA will remain  in effect (meaning this test can be used) for the duration of the COVID-19 declaration under Section 56 4(b)(1) of the Act, 21 U.S.C. section 360bbb-3(b)(1), unless the authorization is terminated or revoked sooner. Performed at [facility], [place] [address], [place], [place] [address]      Labs: Basic Metabolic Panel: Recent Labs  Lab 11/14/19 1535 11/15/19 0519 11/15/19 1453 11/16/19 0433  NA 136 137  --  139  K 3.3* 3.2*  --  3.6  CL 101 103  --  104  CO2 27 26  --  26  GLUCOSE 87 132*  --  121*  BUN 17 14  --  14  CREATININE 1.05 1.02  --  1.01  CALCIUM 8.4* 8.3*  --  8.4*  MG  --   --  1.8  --    CBC: Recent Labs  Lab 11/14/19 1535 11/15/19 0519  WBC 4.7 4.3  NEUTROABS 2.3  --   HGB 13.3 12.4*  HCT 39.6 37.2*  MCV 92.5 92.3  PLT 326 336    CBG: Recent Labs  Lab 11/15/19 1143 11/15/19 1656 11/15/19 2147 11/16/19 0814 11/16/19 1144  GLUCAP 124* 99 122* 113* 115*   Signed:  Barton Dubois MD.  [facility] 11/16/2019, 1:17 PM

## 2019-11-19 LAB — CULTURE, BLOOD (ROUTINE X 2)
Culture: NO GROWTH
Culture: NO GROWTH
Special Requests: ADEQUATE
Special Requests: ADEQUATE

## 2019-11-27 ENCOUNTER — Ambulatory Visit: Payer: Medicare Other | Admitting: Internal Medicine

## 2019-11-27 ENCOUNTER — Other Ambulatory Visit: Payer: Self-pay

## 2019-11-27 ENCOUNTER — Encounter: Payer: Self-pay | Admitting: Internal Medicine

## 2019-11-27 DIAGNOSIS — G4733 Obstructive sleep apnea (adult) (pediatric): Secondary | ICD-10-CM | POA: Diagnosis not present

## 2019-11-27 DIAGNOSIS — L03115 Cellulitis of right lower limb: Secondary | ICD-10-CM | POA: Diagnosis not present

## 2019-11-27 NOTE — Progress Notes (Signed)
    Patient ID: Christopher Lambert, male    DOB: 12/12/1955, 64 y.o.   MRN: [id number]  HPI male never smoker followed for OSA, rhinitis, complicated by HBP, DM 2, morbid obesity NPSG 01/31/15- severe OSA AHI 49.4/ hr, CPAP suggested 13, AHI 0.0, weight 294 lb  -------------------------------------------------------------------------------  11/24/2018-  64 year old male never smoker followed for OSA, rhinitis, complicated by HBP, DM 2, morbid obesity CPAP 16/Advanced Download 100% compliance, AHI 6.9/ hr Body weight today 274 lbs    BP 150/100 He reports doing well with CPAP.  Needs replacement mask and supplies. Seasonal allergies have begun.  Manages okay with Flonase and antihistamine.  11/27/19- 64 year old male never smoker followed for OSA, rhinitis, complicated by HBP, DM 2, morbid obesity CPAP 16/Advanced Download compliance 90%, AHI 4.5 Body weight today 289 lbs Hosp 3/2-11/16/19- cellulitis R leg, venous insufficiency, no DVT Has had flu vax, no covax yet  ROS-see HPI + = positive Constitutional:   No-   weight loss, night sweats, fevers, chills, fatigue, lassitude. HEENT:   No-  headaches, difficulty swallowing, tooth/dental problems, sore throat,       No-  sneezing, itching, ear ache, nasal congestion, post nasal drip,  CV:  No-   chest pain, orthopnea, PND, +swelling in lower extremities, anasarca, dizziness, palpitations Resp: No-   shortness of breath with exertion or at rest.              No-   productive cough,  No non-productive cough,  No- coughing up of blood.              No-   change in color of mucus.  No- wheezing.   Skin: No-   rash or lesions. GI:  No-   heartburn, indigestion, abdominal pain, nausea, vomiting, GU:  MS:  No-   joint pain or swelling.   Neuro-     nothing unusual Psych:  No- change in mood or affect. No depression or anxiety.  No memory loss.  Objective:   Physical Exam General- Alert, Oriented, Affect-appropriate, Distress- none acute,  +obese Skin- rash-none, lesions- none, excoriation- none Lymphadenopathy- none Head- atraumatic            Eyes- Gross vision intact, PERRLA, conjunctivae clear secretions            Ears- Hearing, canals-normal            Nose- Clear, no-Septal dev, mucus, polyps, erosion, perforation             Throat- Mallampati IV , mucosa clear and it seems normally moist, drainage- none, tonsils- atrophic Neck- flexible , trachea midline, no stridor , thyroid nl, carotid no bruit Chest - symmetrical excursion , unlabored           Heart/CV- RRR , no murmur , no gallop  , no rub, nl s1 s2                           - JVD- none , elastic hose+           Lung- clear to P&A, wheeze- none, cough- none , dullness-none, rub- none           Chest wall-  Abd-  Br/ Gen/ Rectal- Not done, not indicated Extrem- +elastic hose R leg Neuro- grossly intact to observation

## 2019-11-27 NOTE — Patient Instructions (Signed)
We can continue CPAP 16, mask of choice, humidifier, supplies, AirView/ card  Please call if we can help

## 2019-11-29 ENCOUNTER — Other Ambulatory Visit: Payer: Self-pay

## 2019-11-29 ENCOUNTER — Encounter: Payer: Self-pay | Admitting: Family Medicine

## 2019-11-29 ENCOUNTER — Encounter: Payer: Self-pay | Admitting: Urology

## 2019-11-29 ENCOUNTER — Ambulatory Visit: Payer: Medicare Other | Admitting: Family Medicine

## 2019-11-29 ENCOUNTER — Ambulatory Visit: Payer: Medicare Other | Admitting: Urology

## 2019-11-29 VITALS — BP 119/70 | HR 70 | Temp 97.9°F | Ht 67.5 in | Wt 287.0 lb

## 2019-11-29 VITALS — BP 130/80 | HR 75 | Temp 98.4°F | Resp 16 | Ht 67.5 in | Wt 287.0 lb

## 2019-11-29 DIAGNOSIS — R972 Elevated prostate specific antigen [PSA]: Secondary | ICD-10-CM | POA: Diagnosis not present

## 2019-11-29 DIAGNOSIS — E785 Hyperlipidemia, unspecified: Secondary | ICD-10-CM

## 2019-11-29 DIAGNOSIS — L03115 Cellulitis of right lower limb: Secondary | ICD-10-CM

## 2019-11-29 DIAGNOSIS — I10 Essential (primary) hypertension: Secondary | ICD-10-CM

## 2019-11-29 DIAGNOSIS — Z7689 Persons encountering health services in other specified circumstances: Secondary | ICD-10-CM | POA: Diagnosis not present

## 2019-11-29 NOTE — Patient Instructions (Addendum)
I appreciate the opportunity to provide you with care for your health and wellness. Today we discussed: recent hospital stay   Follow up: 03/11/2020 as scheduled  Labs today  Keep leg elevated when sitting.  Stay active as much as possible   Continue compression socks  Please continue to practice social distancing to keep you, your family, and our community safe.  If you must go out, please wear a mask and practice good handwashing.  It was a pleasure to see you and I look forward to continuing to work together on your health and well-being. Please do not hesitate to call the office if you need care or have questions about your care.  Have a wonderful day and week. With Gratitude, Cherly Beach, DNP, AGNP-BC

## 2019-11-29 NOTE — Assessment & Plan Note (Signed)
Deteriorated Christopher Lambert is re-educated about the importance of exercise daily to help with weight management. A minumum of 30 minutes daily is recommended. Additionally, importance of healthy food choices  with portion control discussed.  Wt Readings from Last 3 Encounters:  11/29/19 287 lb (130.2 kg)  11/27/19 289 lb 12.8 oz (131.5 kg)  11/16/19 277 lb 4.8 oz (125.8 kg)

## 2019-11-29 NOTE — Progress Notes (Signed)
Subjective:  Patient ID: Christopher Lambert, male    DOB: 23-Jan-1956  Age: 64 y.o. MRN: [id number]  CC:  Chief Complaint  Patient presents with  . Hospitalization Follow-up    cellulitis of right lower extremity      HPI  HPI   Christopher Lambert is a 64 y.o.malewith medical history significant ofhypertension, hyperlipidemia, diabetes mellitus type 2, morbid obesity, gout, obstructive sleep apnea who presented to the ER on November 14, 2019 with worsening right lower extremity swelling onpatient was initially seen in the ER on 11/04/2019 with worsening right lower extremity swelling and was thought to have cellulitis. At that time he received 1 dose of Rocephin and vancomycin and was discharged home on doxycycline. He subsequently had a follow-up as outpatient on 11/07/2019 and was given additional coverage with Keflex due to persistent symptoms. Patient subsequently had another follow-up on March 2 of 2021 upon which I sent him to the emergency room to be evaluated as she did not show any improvement. He has had no discharge from the right lower extremity, no fever, chills. Denies having chest pain, shortness of breath, palpitations, cough.  Denied having nausea, vomiting, abdominal pain, dysuria, diarrhea.  Denies having syncope, seizures, dizziness, lightheadedness, focal deficits, visual deficits, neck stiffness, headache.  Right lower venous ultrasound shows no evidence of DVT. CT of the right lower extremity shows diffuse subcutaneous edema throughout the right lower leg consistent with cellulitis. No evidence of abscess, subcutaneous gas, osteomyelitis.  Discharged continue TED hoses compression stocking and daily lasix.  Today reports feeling great. Has demonstrated reduction in swelling on Right lower leg. He has his compression stocking and has been taking his lasix as directed.  He reports it is better first thing in the morning, and will swelling after being up, but is overall it is  great improvement in his previous status.   Today patient denies signs and symptoms of COVID 19 infection including fever, chills, cough, shortness of breath, and headache. Past Medical, Surgical, Social History, Allergies, and Medications have been Reviewed.   Past Medical History:  Diagnosis Date  . Abnormal cardiovascular function study 09/21/2011  . Arthritis of knee    Bilateral  . 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 ED (erectile dysfunction)   . Essential hypertension, benign 1990  . H/O: whooping cough    aschild  . Hemorrhoids, internal   . Metabolic syndrome [id number]  . Obesity   . Obstructive sleep apnea 11/09/2007   NPSG 08/26/04 AHI 92/hr, [place], [place] NPSG 01/31/15- severe OSA AHI 49.4/ hr, CPAP suggested 13, AHI 0.0, weight 294 lbs CPAP Advanced    . Palpitations 08/24/2011  . Piles (hemorrhoids) 06/19/2014  . Seasonal allergies   . Sleep apnea 2004   wears CPAP nightly      Current Meds  Medication Sig  . allopurinol (ZYLOPRIM) 300 MG tablet Take 1 tablet (300 mg total) by mouth daily.  Marland Kitchen aspirin 81 MG EC tablet TAKE 1 TABLET EVERY DAY (Patient taking differently: Take 81 mg by mouth daily. )  . cholecalciferol (VITAMIN D) 1000 UNITS tablet Take 1,000 Units by mouth daily.    Marland Kitchen ELDERBERRY PO Take 1 tablet by mouth daily.  . fluticasone (FLONASE) 50 MCG/ACT nasal spray Place 2 sprays into both nostrils 2 (two) times daily. (Patient taking differently: Place 2 sprays into both nostrils daily as needed for allergies or rhinitis. )  . furosemide (LASIX) 20 MG  tablet Take 1 tablet (20 mg total) by mouth daily.  . Multiple Vitamin (MULTIVITAMIN WITH MINERALS) TABS tablet Take 1 tablet by mouth daily.  Marland Kitchen NIFEdipine (PROCARDIA XL/NIFEDICAL-XL) 90 MG 24 hr tablet TAKE 1 TABLET BY MOUTH EVERY DAY (Patient taking differently: Take 90 mg by mouth in the morning. )  . potassium chloride (KLOR-CON) 10 MEQ tablet Take 2 tablets (20 mEq total) by mouth daily.    . simvastatin (ZOCOR) 5 MG tablet TAKE 1 TABLET BY MOUTH EVERYDAY AT BEDTIME (Patient taking differently: Take 5 mg by mouth every morning. )    ROS:  Review of Systems  Constitutional: Negative.   HENT: Negative.   Eyes: Negative.   Respiratory: Negative.   Cardiovascular: Negative.   Gastrointestinal: Negative.   Genitourinary: Negative.   Musculoskeletal: Negative.   Skin: Negative.   Neurological: Negative.   Endo/Heme/Allergies: Negative.   Psychiatric/Behavioral: Negative.   All other systems reviewed and are negative.    Objective:   Today's Vitals: BP 130/80   Pulse 75   Temp 98.4 F (36.9 C) (Temporal)   Resp 16   Ht 5' 7.5" (1.715 m)   Wt 287 lb (130.2 kg)   SpO2 98%   BMI 44.29 kg/m  Vitals with BMI 11/29/2019 11/27/2019 11/16/2019  Height 5' 7.5" 5\' 7"  -  Weight 287 lbs 289 lbs 13 oz 277 lbs 5 oz  BMI 44.26 [id number]  Systolic [id number]  Diastolic 80 82 76  Pulse 75 74 68     Physical Exam Vitals and nursing note reviewed.  Constitutional:      Appearance: Normal appearance. He is well-developed and well-groomed. He is obese.  HENT:     Head: Normocephalic and atraumatic.     Right Ear: External ear normal.     Left Ear: External ear normal.     Mouth/Throat:     Comments: Mask in place  Eyes:     General:        Right eye: No discharge.        Left eye: No discharge.     Conjunctiva/sclera: Conjunctivae normal.  Cardiovascular:     Rate and Rhythm: Normal rate and regular rhythm.     Pulses: Normal pulses.          Dorsalis pedis pulses are 2+ on the right side and 2+ on the left side.       Posterior tibial pulses are 2+ on the right side and 2+ on the left side.     Heart sounds: Normal heart sounds.     Comments: Overall significant improvement seen with very little erythematous changes and no warmth sensation to touch. No open wounds, no drainage, no other abnormalities or complaints appreciated. Pulmonary:     Effort: Pulmonary  effort is normal.     Breath sounds: Normal breath sounds.  Musculoskeletal:        General: Normal range of motion.     Cervical back: Normal range of motion and neck supple.     Right lower leg: Edema present.  Skin:    General: Skin is warm.  Neurological:     General: No focal deficit present.     Mental Status: He is alert and oriented to person, place, and time.  Psychiatric:        Attention and Perception: Attention normal.        Mood and Affect: Mood normal.        Speech: Speech normal.  Behavior: Behavior normal. Behavior is cooperative.        Thought Content: Thought content normal.        Cognition and Memory: Cognition normal.        Judgment: Judgment normal.    Assessment   1. Encounter for support and coordination of transition of care   2. Cellulitis of right leg   3. Essential hypertension, benign   4. Morbid obesity ([place])   5. Dyslipidemia, goal LDL below 100     Tests ordered Orders Placed This Encounter  Procedures  . CBC  . COMPLETE METABOLIC PANEL WITH GFR   Plan: Please see assessment and plan per problem list above.   No orders of the defined types were placed in this encounter.   Patient to follow-up in 03/11/2020.  Perlie Mayo, NP

## 2019-11-29 NOTE — Progress Notes (Signed)
11/29/2019 2:48 PM   Christopher Lambert 1956-05-09 [id number]  Referring provider: [name], MD [address] [place],  [place] [address]  Elevated PSA  HPI: Mr Stjulien is a 64yo here for followup for elevated PSA. He has not had a recent PSA. No new LUTS. No nocturia. NO recent UTIs. No dysuria. No new medical problems  His records from [facility] are as follows: Referral from Dr. Matilde Sprang and Dr. Meda Coffee. His urine Cx grew pan sensitive e coli. He was started on Cipro and completes it today. His frequency and urgency resolved. He voids with a good stream.   No FH PCa.   Pt seen for [facility] w/u Nov and Dec 2016 - negative CT and cystoscopy.   05/17/2019: The patient was previously seen for elevated PSA of 10 by Dr. Junious Silk. PSAs have been around 3 and this year his PSA increased to 4.5. No new LUTS. no significant LUTS    PMH: Past Medical History:  Diagnosis Date  . Abnormal cardiovascular function study 09/21/2011  . Arthritis of knee    Bilateral  . 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 ED (erectile dysfunction)   . Essential hypertension, benign 1990  . H/O: whooping cough    aschild  . Hemorrhoids, internal   . Metabolic syndrome [id number]  . Obesity   . Obstructive sleep apnea 11/09/2007   NPSG 08/26/04 AHI 92/hr, [place], [place] NPSG 01/31/15- severe OSA AHI 49.4/ hr, CPAP suggested 13, AHI 0.0, weight 294 lbs CPAP Advanced    . Palpitations 08/24/2011  . Piles (hemorrhoids) 06/19/2014  . Seasonal allergies   . Sleep apnea 2004   wears CPAP nightly      Surgical History: Past Surgical History:  Procedure Laterality Date  . CARDIAC CATHETERIZATION  09/25/2011  . CARPAL TUNNEL RELEASE     Right   . COLONOSCOPY N/A 03/23/2018   Procedure: COLONOSCOPY;  Surgeon: Daneil Dolin, MD;  [facility];  Service: Endoscopy;  Laterality: N/A;  8:00  . HERNIA REPAIR  [id number] approx   umbilical  . JOINT REPLACEMENT  2011   right knee  .  JOINT REPLACEMENT  2013   left knee  . left knee replacement   sept 2013  . POLYPECTOMY  03/23/2018   Procedure: POLYPECTOMY;  Surgeon: Daneil Dolin, MD;  [facility];  Service: Endoscopy;;  colon  . Right knee replacement  06/2010  . TOTAL KNEE ARTHROPLASTY  05/18/2012   Procedure: TOTAL KNEE ARTHROPLASTY;  Surgeon: Ninetta Lights, MD;  Location: [facility];  Service: Orthopedics;  Laterality: Left;  left total knee arthroplasty  . UMBILICAL HERNIA REPAIR      Home Medications:  Allergies as of 11/29/2019   No Known Allergies     Medication List       Accurate as of November 29, 2019  2:48 PM. If you have any questions, ask your nurse or doctor.        allopurinol 300 MG tablet Commonly known as: ZYLOPRIM Take 1 tablet (300 mg total) by mouth daily.   aspirin 81 MG EC tablet TAKE 1 TABLET EVERY DAY   cholecalciferol 1000 units tablet Commonly known as: VITAMIN D Take 1,000 Units by mouth daily.   ELDERBERRY PO Take 1 tablet by mouth daily.   fluticasone 50 MCG/ACT nasal spray Commonly known as: FLONASE Place 2 sprays into both nostrils 2 (two) times daily. What  changed:   when to take this  reasons to take this   furosemide 20 MG tablet Commonly known as: Lasix Take 1 tablet (20 mg total) by mouth daily.   multivitamin with minerals Tabs tablet Take 1 tablet by mouth daily.   NIFEdipine 90 MG 24 hr tablet Commonly known as: PROCARDIA XL/NIFEDICAL-XL TAKE 1 TABLET BY MOUTH EVERY DAY What changed: when to take this   potassium chloride 10 MEQ tablet Commonly known as: KLOR-CON Take 2 tablets (20 mEq total) by mouth daily.   simvastatin 5 MG tablet Commonly known as: ZOCOR TAKE 1 TABLET BY MOUTH EVERYDAY AT BEDTIME What changed: See the new instructions.       Allergies: No Known Allergies  Family History: Family History  Problem Relation Age of Onset  . Coronary artery disease Brother 48       Premature  . Hypertension Brother   .  Hypertension Father   . Cancer Father 59       colon   . Hypertension Mother   . Diabetes Brother   . Hypertension Sister   . Hypertension Sister   . Hypertension Sister   . Hypertension Sister     Social History:  reports that he has quit smoking. His smoking use included cigarettes. He smoked 0.50 packs per day. He has never used smokeless tobacco. He reports that he does not drink alcohol or use drugs.  ROS: All other review of systems were reviewed and are negative except what is noted above in HPI  Physical Exam: BP 119/70   Pulse 70   Temp 97.9 F (36.6 C)   Ht 5' 7.5" (1.715 m)   Wt 287 lb (130.2 kg)   BMI 44.29 kg/m   Constitutional:  Alert and oriented, No acute distress. HEENT: [place] AT, moist mucus membranes.  Trachea midline, no masses. Cardiovascular: No clubbing, cyanosis, or edema. Respiratory: Normal respiratory effort, no increased work of breathing. GI: Abdomen is soft, nontender, nondistended, no abdominal masses GU: No CVA tenderness Lymph: No cervical or inguinal lymphadenopathy. Skin: No rashes, bruises or suspicious lesions. Neurologic: Grossly intact, no focal deficits, moving all 4 extremities. Psychiatric: Normal mood and affect.  Laboratory Data: Lab Results  Component Value Date   WBC 4.3 11/15/2019   HGB 12.4 (L) 11/15/2019   HCT 37.2 (L) 11/15/2019   MCV 92.3 11/15/2019   PLT 336 11/15/2019    Lab Results  Component Value Date   CREATININE 1.01 11/16/2019    Lab Results  Component Value Date   PSA 4.5 (H) 04/11/2019   PSA 3.0 09/20/2017   PSA 10.9 (H) 06/29/2016    No results found for: TESTOSTERONE  Lab Results  Component Value Date   HGBA1C 6.4 (H) 09/19/2019    Urinalysis    Component Value Date/Time   COLORURINE AMBER (A) 04/16/2019 1642   APPEARANCEUR CLOUDY (A) 04/16/2019 1642   LABSPEC 1.019 04/16/2019 1642   PHURINE 5.0 04/16/2019 1642   GLUCOSEU NEGATIVE 04/16/2019 1642   HGBUR LARGE (A) 04/16/2019 1642    BILIRUBINUR NEGATIVE 04/16/2019 1642   BILIRUBINUR small 06/22/2016 1310   KETONESUR NEGATIVE 04/16/2019 1642   PROTEINUR 30 (A) 04/16/2019 1642   UROBILINOGEN 1.0 06/22/2016 1310   UROBILINOGEN 0.2 05/12/2012 0901   NITRITE NEGATIVE 04/16/2019 1642   LEUKOCYTESUR MODERATE (A) 04/16/2019 1642    Lab Results  Component Value Date   BACTERIA MANY (A) 04/16/2019    Pertinent Imaging:  No results found for this or any previous  visit. No results found for this or any previous visit. No results found for this or any previous visit. No results found for this or any previous visit. No results found for this or any previous visit. No results found for this or any previous visit. No results found for this or any previous visit. Results for orders placed during the hospital encounter of 04/16/19  CT RENAL STONE STUDY   Narrative CLINICAL DATA:  Weakness fever body aches  EXAM: CT ABDOMEN AND PELVIS WITHOUT CONTRAST  TECHNIQUE: Multidetector CT imaging of the abdomen and pelvis was performed following the standard protocol without IV contrast.  COMPARISON:  August 12, 2015  FINDINGS: The study is limited due to patient motion.  Lower chest: The visualized heart size within normal limits. No pericardial fluid/thickening.  No hiatal hernia.  The visualized portions of the lungs are clear.  Hepatobiliary: Although limited due to the lack of intravenous contrast, normal in appearance without gross focal abnormality. No evidence of calcified gallstones or biliary ductal dilatation.  Pancreas:  Unremarkable.  No surrounding inflammatory changes.  Spleen: Normal in size. Although limited due to the lack of intravenous contrast, normal in appearance.  Adrenals/Urinary Tract: Both adrenal glands appear normal. There is perinephric stranding seen around both kidneys and proximal ureteral stranding. Again noted are bilateral partially at exophytic low-density lesions, the  largest is off the lower pole of the right kidney measuring 4.4 cm. The bladder is partially decompressed with diffuse bladder wall thickening. No calculi are noted.  Stomach/Bowel: The stomach, small bowel, and colon are normal in appearance. No inflammatory changes or obstructive findings. appendix is normal.  Vascular/Lymphatic: There are no enlarged abdominal or pelvic lymph nodes. No significant gross vascular findings are present.  Reproductive: The prostate is unremarkable.  Other: No evidence of abdominal wall mass or hernia.  Musculoskeletal: No acute or significant osseous findings.  IMPRESSION: 1. Bilateral perinephric and proximal periureteral stranding with diffuse mild bladder wall thickening. This could be due to bilateral pyelonephritis and chronic cystitis. 2. No renal calculi or hydronephrosis 3. Bilateral simple renal cysts.   Electronically Signed   By: Prudencio Pair M.D.   On: 04/17/2019 17:11     Assessment & Plan:    1. Elevated PSA -PSA today, will call with results. If stable he will return in 6 months with PSA   No follow-ups on file.  Nicolette Bang, MD  [facility] [place]

## 2019-11-29 NOTE — Progress Notes (Signed)
Urological Symptom Review  Patient is experiencing the following symptoms: Urinary tract infection   Review of Systems  Gastrointestinal (upper)  : Negative for upper GI symptoms  Gastrointestinal (lower) : Negative for lower GI symptoms  Constitutional : Negative for symptoms  Skin: Negative for skin symptoms  Eyes: Negative for eye symptoms  Ear/Nose/Throat : Sinus problems  Hematologic/Lymphatic: Negative for Hematologic/Lymphatic symptoms  Cardiovascular : Leg swelling  Respiratory : Negative for respiratory symptoms  Endocrine: Negative for endocrine symptoms  Musculoskeletal: Negative for musculoskeletal symptoms  Neurological: Negative for neurological symptoms  Psychologic: Negative for psychiatric symptoms

## 2019-11-29 NOTE — Assessment & Plan Note (Signed)
Please see note Reviewed test, labs, notes, and findings and recommendations. Labs ordered for follow up.

## 2019-11-29 NOTE — Assessment & Plan Note (Signed)
Improvement overall significantly and swelling.  Still has some redness to the leg.  Continue Lasix and compression socks.  If needed we will do [place] boot via lymphedema clinic

## 2019-11-29 NOTE — Assessment & Plan Note (Signed)
Controlled, continue current medication 

## 2019-11-29 NOTE — Assessment & Plan Note (Signed)
Will follow up in June. Encouraged a heart healthy low fat diet

## 2019-11-30 ENCOUNTER — Other Ambulatory Visit: Payer: Self-pay | Admitting: Urology

## 2019-11-30 LAB — COMPLETE METABOLIC PANEL WITH GFR
AG Ratio: 1.2 (calc) (ref 1.0–2.5)
ALT: 13 U/L (ref 9–46)
AST: 15 U/L (ref 10–35)
Albumin: 3.8 g/dL (ref 3.6–5.1)
Alkaline phosphatase (APISO): 47 U/L (ref 35–144)
BUN: 15 mg/dL (ref 7–25)
CO2: 27 mmol/L (ref 20–32)
Calcium: 8.9 mg/dL (ref 8.6–10.3)
Chloride: 104 mmol/L (ref 98–110)
Creat: 1.02 mg/dL (ref 0.70–1.25)
GFR, Est African American: 91 mL/min/{1.73_m2} (ref >=60)
GFR, Est Non African American: 78 mL/min/{1.73_m2} (ref >=60)
Globulin: 3.2 g/dL (calc) (ref 1.9–3.7)
Glucose, Bld: 124 mg/dL — ABNORMAL HIGH (ref 65–99)
Potassium: 3.9 mmol/L (ref 3.5–5.3)
Sodium: 139 mmol/L (ref 135–146)
Total Bilirubin: 0.6 mg/dL (ref 0.2–1.2)
Total Protein: 7 g/dL (ref 6.1–8.1)

## 2019-11-30 LAB — CBC
HCT: 40.3 % (ref 38.5–50.0)
Hemoglobin: 13.5 g/dL (ref 13.2–17.1)
MCH: 30.9 pg (ref 27.0–33.0)
MCHC: 33.5 g/dL (ref 32.0–36.0)
MCV: 92.2 fL (ref 80.0–100.0)
MPV: 10.1 fL (ref 7.5–12.5)
Platelets: 229 10*3/uL (ref 140–400)
RBC: 4.37 10*6/uL (ref 4.20–5.80)
RDW: 13 % (ref 11.0–15.0)
WBC: 4.5 10*3/uL (ref 3.8–10.8)

## 2019-11-30 LAB — PSA: PSA: 2.7 ng/mL (ref <=4.0)

## 2019-12-04 ENCOUNTER — Other Ambulatory Visit: Payer: Self-pay | Admitting: Urology

## 2019-12-06 NOTE — Assessment & Plan Note (Signed)
He benefits from CPAP with good compliance and control Plan- continue CPAP 16

## 2019-12-06 NOTE — Assessment & Plan Note (Signed)
Now wearing elastic hose. This will be a long-term problem with sedentary lifestyle, obesity and peripheral venous insufficiency.

## 2019-12-13 ENCOUNTER — Other Ambulatory Visit: Payer: Self-pay | Admitting: Family Medicine

## 2019-12-20 NOTE — Assessment & Plan Note (Signed)
Managing now with elastic hose, elevation. Plan- continue current program.

## 2019-12-20 NOTE — Assessment & Plan Note (Signed)
Weight loss strongly encouraged- would help OSA, peripheral edema and other problems.

## 2020-01-01 ENCOUNTER — Other Ambulatory Visit: Payer: Self-pay | Admitting: *Deleted

## 2020-01-01 MED ORDER — SIMVASTATIN 5 MG PO TABS: ORAL_TABLET | ORAL | 2 refills | Status: DC

## 2020-01-02 ENCOUNTER — Other Ambulatory Visit: Payer: Self-pay | Admitting: Family Medicine

## 2020-02-22 LAB — HM DIABETES EYE EXAM

## 2020-02-28 ENCOUNTER — Other Ambulatory Visit: Payer: Self-pay

## 2020-02-28 MED ORDER — POTASSIUM CHLORIDE ER 10 MEQ PO TBCR: 20.0000 meq | EXTENDED_RELEASE_TABLET | Freq: Every day | ORAL | 3 refills | Status: DC

## 2020-03-02 LAB — HEMOGLOBIN A1C
Hgb A1c MFr Bld: 6.2 % of total Hgb — ABNORMAL HIGH (ref <5.7)
Mean Plasma Glucose: 131 (calc)
eAG (mmol/L): 7.3 (calc)

## 2020-03-02 LAB — LIPID PANEL
Cholesterol: 133 mg/dL (ref <200)
HDL: 38 mg/dL — ABNORMAL LOW (ref >=40)
LDL Cholesterol (Calc): 77 mg/dL (calc)
Non-HDL Cholesterol (Calc): 95 mg/dL (calc) (ref <130)
Total CHOL/HDL Ratio: 3.5 (calc) (ref <5.0)
Triglycerides: 93 mg/dL (ref <150)

## 2020-03-02 LAB — COMPLETE METABOLIC PANEL WITH GFR
AG Ratio: 1.5 (calc) (ref 1.0–2.5)
ALT: 19 U/L (ref 9–46)
AST: 18 U/L (ref 10–35)
Albumin: 4.1 g/dL (ref 3.6–5.1)
Alkaline phosphatase (APISO): 49 U/L (ref 35–144)
BUN: 13 mg/dL (ref 7–25)
CO2: 26 mmol/L (ref 20–32)
Calcium: 8.8 mg/dL (ref 8.6–10.3)
Chloride: 104 mmol/L (ref 98–110)
Creat: 0.9 mg/dL (ref 0.70–1.25)
GFR, Est African American: 105 mL/min/{1.73_m2} (ref >=60)
GFR, Est Non African American: 91 mL/min/{1.73_m2} (ref >=60)
Globulin: 2.7 g/dL (calc) (ref 1.9–3.7)
Glucose, Bld: 121 mg/dL — ABNORMAL HIGH (ref 65–99)
Potassium: 3.9 mmol/L (ref 3.5–5.3)
Sodium: 138 mmol/L (ref 135–146)
Total Bilirubin: 0.6 mg/dL (ref 0.2–1.2)
Total Protein: 6.8 g/dL (ref 6.1–8.1)

## 2020-03-02 LAB — VITAMIN D 25 HYDROXY (VIT D DEFICIENCY, FRACTURES): Vit D, 25-Hydroxy: 38 ng/mL (ref 30–100)

## 2020-03-11 ENCOUNTER — Other Ambulatory Visit: Payer: Self-pay | Admitting: *Deleted

## 2020-03-11 ENCOUNTER — Other Ambulatory Visit: Payer: Self-pay

## 2020-03-11 ENCOUNTER — Other Ambulatory Visit (HOSPITAL_COMMUNITY)
Admission: RE | Admit: 2020-03-11 | Discharge: 2020-03-11 | Disposition: A | Discharge status: Home / Self Care | Payer: Medicare Other | Source: Skilled Nursing Facility | Attending: Family Medicine | Admitting: Family Medicine

## 2020-03-11 ENCOUNTER — Encounter: Payer: Self-pay | Admitting: Family Medicine

## 2020-03-11 ENCOUNTER — Ambulatory Visit (INDEPENDENT_AMBULATORY_CARE_PROVIDER_SITE_OTHER): Payer: Medicare Other | Admitting: Family Medicine

## 2020-03-11 VITALS — BP 124/78 | HR 68 | Temp 97.1°F | Resp 16 | Ht 67.5 in | Wt 286.0 lb

## 2020-03-11 DIAGNOSIS — R7303 Prediabetes: Secondary | ICD-10-CM

## 2020-03-11 DIAGNOSIS — I10 Essential (primary) hypertension: Secondary | ICD-10-CM

## 2020-03-11 DIAGNOSIS — R600 Localized edema: Secondary | ICD-10-CM | POA: Diagnosis not present

## 2020-03-11 DIAGNOSIS — E785 Hyperlipidemia, unspecified: Secondary | ICD-10-CM

## 2020-03-11 DIAGNOSIS — G4733 Obstructive sleep apnea (adult) (pediatric): Secondary | ICD-10-CM

## 2020-03-11 MED ORDER — FUROSEMIDE 20 MG PO TABS: 20.0000 mg | ORAL_TABLET | Freq: Every day | ORAL | 2 refills | Status: DC

## 2020-03-11 MED ORDER — METFORMIN HCL 500 MG PO TABS: 500.0000 mg | ORAL_TABLET | Freq: Every day | ORAL | 1 refills | Status: DC

## 2020-03-11 NOTE — Progress Notes (Signed)
Christopher Lambert     MRN: [id number]      DOB: September 14, 1956   HPI Christopher Lambert is here for follow up and re-evaluation of chronic medical conditions, medication management and review of any available recent lab and radiology data.  Preventive health is updated, specifically  Cancer screening and Immunization.   Questions or concerns regarding consultations or procedures which the PT has had in the interim are  addressed. The PT denies any adverse reactions to current medications since the last visit.  C/o persistent right leg swelling  ROS Denies recent fever or chills. Denies sinus pressure, nasal congestion, ear pain or sore throat. Denies chest congestion, productive cough or wheezing. Denies chest pains, palpitations and leg swelling Denies abdominal pain, nausea, vomiting,diarrhea or constipation.   Denies dysuria, frequency, hesitancy or incontinence. Denies joint pain, swelling and limitation in mobility. Denies headaches, seizures, numbness, or tingling. Denies depression, anxiety or insomnia. Denies skin break down or rash.   PE  BP 124/78   Pulse 68   Temp (!) 97.1 F (36.2 C) (Temporal)   Resp 16   Ht 5' 7.5" (1.715 m)   Wt 286 lb (129.7 kg)   SpO2 98%   BMI 44.13 kg/m    Patient alert and oriented and in no cardiopulmonary distress.  HEENT: No facial asymmetry, EOMI,     Neck supple .  Chest: Clear to auscultation bilaterally.  CVS: S1, S2 no murmurs, no S3.Regular rate.  ABD: Soft non tender.   Ext: 2 plus edema right leg, one plus left leg  MS: Adequate ROM spine, shoulders, hips and knees.  Skin: Intact, no ulcerations or rash noted.  Psych: Good eye contact, normal affect. Memory intact not anxious or depressed appearing.  CNS: CN 2-12 intact, power,  normal throughout.no focal deficits noted.   Assessment & Plan  Bilateral leg edema Right leg edema worse than left ,neeedds15 to 19 mm HG hose knee high and refer to  Vascular  Essential  hypertension, benign Controlled, no change in medication DASH diet and commitment to daily physical activity for a minimum of 30 minutes discussed and encouraged, as a part of hypertension management. The importance of attaining a healthy weight is also discussed.  BP/Weight 03/11/2020 11/29/2019 11/29/2019 11/27/2019 11/16/2019 04/21/8675 03/15/946  Systolic BP 096 283 662 947 654 - 650  Diastolic BP 78 80 70 82 76 - 84  Wt. (Lbs) 286 287 287 289.8 277.3 - 282.08  BMI 44.13 44.29 44.29 45.39 - 43.43 -       Morbid obesity ([place])  Patient re-educated about  the importance of commitment to a  minimum of 150 minutes of exercise per week as able.  The importance of healthy food choices with portion control discussed, as well as eating regularly and within a 12 hour window most days. The need to choose "clean , green" food 50 to 75% of the time is discussed, as well as to make water the primary drink and set a goal of 64 ounces water daily.    Weight /BMI 03/11/2020 11/29/2019 11/29/2019  WEIGHT 286 lb 287 lb 287 lb  HEIGHT 5' 7.5" 5' 7.5" 5' 7.5"  BMI 44.13 kg/m2 44.29 kg/m2 44.29 kg/m2      Dyslipidemia, goal LDL below 100 Controlled, no change in medication Hyperlipidemia:Low fat diet discussed and encouraged.   Lipid Panel  Lab Results  Component Value Date   CHOL 133 03/01/2020   HDL 38 (L) 03/01/2020   LDLCALC 77 03/01/2020  TRIG 93 03/01/2020   CHOLHDL 3.5 03/01/2020       Prediabetes Improved, resume metformin Patient educated about the importance of limiting  Carbohydrate intake , the need to commit to daily physical activity for a minimum of 30 minutes , and to commit weight loss. The fact that changes in all these areas will reduce or eliminate all together the development of diabetes is stressed.   Diabetic Labs Latest Ref Rng & Units 03/01/2020 11/29/2019 11/16/2019 11/15/2019 11/14/2019  HbA1c <5.7 % of total Hgb 6.2(H) - - - -  Microalbumin mg/dL - - - - -  Micro/Creat  Ratio <30 mcg/mg creat - - - - -  Chol <200 mg/dL 133 - - - -  HDL > OR = 40 mg/dL 38(L) - - - -  Calc LDL mg/dL (calc) 77 - - - -  Triglycerides <150 mg/dL 93 - - - -  Creatinine 0.70 - 1.25 mg/dL 0.90 1.02 1.01 1.02 1.05   BP/Weight 03/11/2020 11/29/2019 11/29/2019 11/27/2019 11/16/2019 05/23/3569 09/20/7937  Systolic BP 030 092 330 076 226 - 333  Diastolic BP 78 80 70 82 76 - 84  Wt. (Lbs) 286 287 287 289.8 277.3 - 282.08  BMI 44.13 44.29 44.29 45.39 - 43.43 -   Foot/eye exam completion dates Latest Ref Rng & Units 02/22/2020 04/27/2019  Eye Exam No Retinopathy No Retinopathy -  Foot Form Completion - - Done      Obstructive sleep apnea Compliant with treatment, encouraged to continue  same

## 2020-03-11 NOTE — Assessment & Plan Note (Signed)
Compliant with treatment, encouraged to continue  same

## 2020-03-11 NOTE — Patient Instructions (Addendum)
Wellness with MD Oren [place]) in A/ugust September, call if you need me sooner  Annual physical exam with MD early December  Please get fasting lipid, cmp and EGFR 1 week before December appointment    You are referred to Vascular in [place] re swollemn right leg  Resume once daily lasix and potassium  Script fopr compression knee highhose to [place] today  Excellent labs and blood pressure  It is important that you exercise regularly at least 30 minutes 5 times a week. If you develop chest pain, have severe difficulty breathing, or feel very tired, stop exercising immediately and seek medical attention   Think about what you will eat, plan ahead. Choose " clean, green, fresh or frozen" over canned, processed or packaged foods which are more sugary, salty and fatty. 70 to 75% of food eaten should be vegetables and fruit. Three meals at set times with snacks allowed between meals, but they must be fruit or vegetables. Aim to eat over a 12 hour period , example 7 am to 7 pm, and STOP after  your last meal of the day. Drink water,generally about 64 ounces per day, no other drink is as healthy. Fruit juice is best enjoyed in a healthy way, by EATING the fruit.  Thanks for choosing [facility], we consider it a privelige to serve you.

## 2020-03-11 NOTE — Assessment & Plan Note (Signed)
Right leg edema worse than left ,neeedds15 to 19 mm HG hose knee high and refer to  Vascular

## 2020-03-11 NOTE — Assessment & Plan Note (Signed)
Controlled, no change in medication DASH diet and commitment to daily physical activity for a minimum of 30 minutes discussed and encouraged, as a part of hypertension management. The importance of attaining a healthy weight is also discussed.  BP/Weight 03/11/2020 11/29/2019 11/29/2019 11/27/2019 11/16/2019 03/21/3735 02/19/1593  Systolic BP 707 615 183 437 357 - 897  Diastolic BP 78 80 70 82 76 - 84  Wt. (Lbs) 286 287 287 289.8 277.3 - 282.08  BMI 44.13 44.29 44.29 45.39 - 43.43 -

## 2020-03-11 NOTE — Assessment & Plan Note (Signed)
Improved, resume metformin Patient educated about the importance of limiting  Carbohydrate intake , the need to commit to daily physical activity for a minimum of 30 minutes , and to commit weight loss. The fact that changes in all these areas will reduce or eliminate all together the development of diabetes is stressed.   Diabetic Labs Latest Ref Rng & Units 03/01/2020 11/29/2019 11/16/2019 11/15/2019 11/14/2019  HbA1c <5.7 % of total Hgb 6.2(H) - - - -  Microalbumin mg/dL - - - - -  Micro/Creat Ratio <30 mcg/mg creat - - - - -  Chol <200 mg/dL 133 - - - -  HDL > OR = 40 mg/dL 38(L) - - - -  Calc LDL mg/dL (calc) 77 - - - -  Triglycerides <150 mg/dL 93 - - - -  Creatinine 0.70 - 1.25 mg/dL 0.90 1.02 1.01 1.02 1.05   BP/Weight 03/11/2020 11/29/2019 11/29/2019 11/27/2019 11/16/2019 11/19/3426 03/19/8114  Systolic BP 726 203 559 741 638 - 453  Diastolic BP 78 80 70 82 76 - 84  Wt. (Lbs) 286 287 287 289.8 277.3 - 282.08  BMI 44.13 44.29 44.29 45.39 - 43.43 -   Foot/eye exam completion dates Latest Ref Rng & Units 02/22/2020 04/27/2019  Eye Exam No Retinopathy No Retinopathy -  Foot Form Completion - - Done

## 2020-03-11 NOTE — Assessment & Plan Note (Signed)
Controlled, no change in medication Hyperlipidemia:Low fat diet discussed and encouraged.   Lipid Panel  Lab Results  Component Value Date   CHOL 133 03/01/2020   HDL 38 (L) 03/01/2020   LDLCALC 77 03/01/2020   TRIG 93 03/01/2020   CHOLHDL 3.5 03/01/2020

## 2020-03-11 NOTE — Assessment & Plan Note (Signed)
  Patient re-educated about  the importance of commitment to a  minimum of 150 minutes of exercise per week as able.  The importance of healthy food choices with portion control discussed, as well as eating regularly and within a 12 hour window most days. The need to choose "clean , green" food 50 to 75% of the time is discussed, as well as to make water the primary drink and set a goal of 64 ounces water daily.    Weight /BMI 03/11/2020 11/29/2019 11/29/2019  WEIGHT 286 lb 287 lb 287 lb  HEIGHT 5' 7.5" 5' 7.5" 5' 7.5"  BMI 44.13 kg/m2 44.29 kg/m2 44.29 kg/m2

## 2020-03-11 NOTE — Addendum Note (Signed)
Addended by: Zacarias Pontes R on: 03/11/2020 09:27 AM   Modules accepted: Orders

## 2020-03-12 LAB — MICROALBUMIN, URINE: Microalb, Ur: <3 ug/mL — ABNORMAL HIGH

## 2020-05-22 ENCOUNTER — Other Ambulatory Visit: Payer: Self-pay

## 2020-05-22 DIAGNOSIS — R600 Localized edema: Secondary | ICD-10-CM

## 2020-05-24 ENCOUNTER — Other Ambulatory Visit: Payer: Self-pay | Admitting: Family Medicine

## 2020-05-30 ENCOUNTER — Encounter: Payer: Medicare Other | Admitting: Family Medicine

## 2020-05-31 ENCOUNTER — Encounter: Payer: Medicare Other | Admitting: Vascular Surgery

## 2020-05-31 ENCOUNTER — Encounter (HOSPITAL_COMMUNITY): Payer: Medicare Other

## 2020-06-03 ENCOUNTER — Other Ambulatory Visit: Payer: Self-pay

## 2020-06-03 ENCOUNTER — Ambulatory Visit (HOSPITAL_COMMUNITY)
Admission: RE | Admit: 2020-06-03 | Discharge: 2020-06-03 | Disposition: A | Discharge status: Home / Self Care | Payer: Medicare Other | Source: Ambulatory Visit | Attending: Surgery | Admitting: Surgery

## 2020-06-03 ENCOUNTER — Ambulatory Visit: Payer: Medicare Other | Admitting: Physician Assistant

## 2020-06-03 VITALS — BP 137/84 | HR 68 | Temp 98.7°F | Resp 20 | Ht 67.5 in | Wt 286.8 lb

## 2020-06-03 DIAGNOSIS — R600 Localized edema: Secondary | ICD-10-CM | POA: Insufficient documentation

## 2020-06-03 DIAGNOSIS — I872 Venous insufficiency (chronic) (peripheral): Secondary | ICD-10-CM

## 2020-06-03 NOTE — Progress Notes (Signed)
Requested by:  [name], MD [address], [place],  [place] [address]  Reason for consultation: RLE swelling    History of Present Illness   Christopher Lambert is a 64 y.o. (02-26-1956) male who presents for evaluation of right lower extremity swelling which has been present for about 8 months. States it began all of the sudden. He was initially seen in ER with concerns of blood clot but had CT and duplex which did not show any DVT. He has continued to have swelling in the right leg. Upon first waking it is improved, but as the day goes on the swelling increases. He has been wearing 15-20 mmHg knee high compression stockings for several months now. It does help a lot with swelling. He states he does get some heaviness in the right leg after being up on his left for a while, but otherwise denies aching, throbbing, burning, or pain. He has no symptoms in LLE. He has no hx of DVT and no family history.   Venous symptoms include: tired,swelling Onset/duration:  8 months Occupation:  retired Aggravating factors: sitting, standing, ambulating Alleviating factors: elevation, compression  Compression:  Knee high Helps: yes Pain medications: no pain Previous vein procedures: none History of DVT: none   Past Medical History:  Diagnosis Date  . Abnormal cardiovascular function study 09/21/2011  . Arthritis of knee    Bilateral  . 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 ED (erectile dysfunction)   . Essential hypertension, benign 1990  . H/O: whooping cough    aschild  . Hemorrhoids, internal   . Metabolic syndrome 9/76/7341  . Obesity   . Obstructive sleep apnea 11/09/2007   NPSG 08/26/04 AHI 92/hr, [place], [place] NPSG 01/31/15- severe OSA AHI 49.4/ hr, CPAP suggested 13, AHI 0.0, weight 294 lbs CPAP Advanced    . Palpitations 08/24/2011  . Piles (hemorrhoids) 06/19/2014  . Seasonal allergies   . Sleep apnea 2004   wears CPAP nightly      Past Surgical  History:  Procedure Laterality Date  . CARDIAC CATHETERIZATION  09/25/2011  . CARPAL TUNNEL RELEASE     Right   . COLONOSCOPY N/A 03/23/2018   Procedure: COLONOSCOPY;  Surgeon: Daneil Dolin, MD;  [facility];  Service: Endoscopy;  Laterality: N/A;  8:00  . HERNIA REPAIR  [id number] approx   umbilical  . JOINT REPLACEMENT  2011   right knee  . JOINT REPLACEMENT  2013   left knee  . left knee replacement   sept 2013  . POLYPECTOMY  03/23/2018   Procedure: POLYPECTOMY;  Surgeon: Daneil Dolin, MD;  Location: [facility];  Service: Endoscopy;;  colon  . Right knee replacement  06/2010  . TOTAL KNEE ARTHROPLASTY  05/18/2012   Procedure: TOTAL KNEE ARTHROPLASTY;  Surgeon: Ninetta Lights, MD;  Location: [facility];  Service: Orthopedics;  Laterality: Left;  left total knee arthroplasty  . UMBILICAL HERNIA REPAIR      Social History   Socioeconomic History  . Marital status: Married    Spouse name: [place]  . Number of children: 0  . Years of education: Not on file  . Highest education level: Not on file  Occupational History    Employer: [employer]  Tobacco Use  . Smoking status: Former Smoker    Packs/day: 0.50    Types: Cigarettes  . Smokeless tobacco: Never Used  Vaping Use  . Vaping Use: Never used  Substance and Sexual Activity  . Alcohol use: No  . Drug use: No  . Sexual activity: Yes  Other Topics Concern  . Not on file  Social History Narrative  . Not on file   Social Determinants of Health   Financial Resource Strain:   . Difficulty of Paying Living Expenses: Not on file  Food Insecurity:   . Worried About Charity fundraiser in the Last Year: Not on file  . Ran Out of Food in the Last Year: Not on file  Transportation Needs:   . Lack of Transportation (Medical): Not on file  . Lack of Transportation (Non-Medical): Not on file  Physical Activity:   . Days of Exercise per Week: Not on file  . Minutes of Exercise per Session: Not on  file  Stress:   . Feeling of Stress : Not on file  Social Connections:   . Frequency of Communication with Friends and Family: Not on file  . Frequency of Social Gatherings with Friends and Family: Not on file  . Attends Religious Services: Not on file  . Active Member of Clubs or Organizations: Not on file  . Attends Archivist Meetings: Not on file  . Marital Status: Not on file  Intimate Partner Violence:   . Fear of Current or Ex-Partner: Not on file  . Emotionally Abused: Not on file  . Physically Abused: Not on file  . Sexually Abused: Not on file    Family History  Problem Relation Age of Onset  . Coronary artery disease Brother 48       Premature  . Hypertension Brother   . Hypertension Father   . Cancer Father 76       colon   . Hypertension Mother   . Diabetes Brother   . Hypertension Sister   . Hypertension Sister   . Hypertension Sister   . Hypertension Sister     Current Outpatient Medications  Medication Sig Dispense Refill  . allopurinol (ZYLOPRIM) 300 MG tablet Take 1 tablet (300 mg total) by mouth daily. 90 tablet 1  . aspirin 81 MG EC tablet TAKE 1 TABLET EVERY DAY (Patient taking differently: Take 81 mg by mouth daily. ) 150 tablet 3  . cholecalciferol (VITAMIN D) 1000 UNITS tablet Take 1,000 Units by mouth daily.      Marland Kitchen ELDERBERRY PO Take 1 tablet by mouth daily. (Patient not taking: Reported on 03/11/2020)    . fluticasone (FLONASE) 50 MCG/ACT nasal spray PLACE 2 SPRAYS INTO BOTH NOSTRILS 2 (TWO) TIMES DAILY. 48 mL 1  . furosemide (LASIX) 20 MG tablet Take 1 tablet (20 mg total) by mouth daily. 90 tablet 2  . metFORMIN (GLUCOPHAGE) 500 MG tablet Take 1 tablet (500 mg total) by mouth daily with breakfast. 90 tablet 1  . Multiple Vitamin (MULTIVITAMIN WITH MINERALS) TABS tablet Take 1 tablet by mouth daily.    Marland Kitchen NIFEdipine (PROCARDIA XL/NIFEDICAL-XL) 90 MG 24 hr tablet TAKE 1 TABLET BY MOUTH EVERY DAY 90 tablet 1  . potassium chloride (KLOR-CON)  10 MEQ tablet Take 2 tablets (20 mEq total) by mouth daily. 60 tablet 3  . simvastatin (ZOCOR) 5 MG tablet TAKE 1 TABLET BY MOUTH EVERYDAY AT BEDTIME 90 tablet 2   No current facility-administered medications for this visit.    No Known Allergies  REVIEW OF SYSTEMS (negative unless checked):   Cardiac:  []  Chest pain or chest pressure? []  Shortness  of breath upon activity? []  Shortness of breath when lying flat? []  Irregular heart rhythm?  Vascular:  []  Pain in calf, thigh, or hip brought on by walking? []  Pain in feet at night that wakes you up from your sleep? []  Blood clot in your veins? []  Leg swelling?  Pulmonary:  []  Oxygen at home? []  Productive cough? []  Wheezing?  Neurologic:  []  Sudden weakness in arms or legs? []  Sudden numbness in arms or legs? []  Sudden onset of difficult speaking or slurred speech? []  Temporary loss of vision in one eye? []  Problems with dizziness?  Gastrointestinal:  []  Blood in stool? []  Vomited blood?  Genitourinary:  []  Burning when urinating? []  Blood in urine?  Psychiatric:  []  Major depression  Hematologic:  []  Bleeding problems? []  Problems with blood clotting?  Dermatologic:  []  Rashes or ulcers?  Constitutional:  []  Fever or chills?  Ear/Nose/Throat:  []  Change in hearing? []  Nose bleeds? []  Sore throat?  Musculoskeletal:  []  Back pain? []  Joint pain? []  Muscle pain?   Physical Examination     Vitals:   06/03/20 1433  BP: 137/84  Pulse: 68  Resp: 20  Temp: 98.7 F (37.1 C)  TempSrc: Temporal  SpO2: 98%  Weight: 286 lb 12.8 oz (130.1 kg)  Height: 5' 7.5" (1.715 m)   Body mass index is 44.26 kg/m.  General:  WDWN in NAD; vital signs documented above Gait: Normal HENT: WNL, normocephalic Pulmonary: normal non-labored breathing , without Rales, rhonchi,  wheezing Cardiac: regular HR, without  Murmurs without carotid bruit Abdomen: obese Vascular Exam/Pulses:  Right Left  Radial 2+ (normal)  2+ (normal)  Femoral 2+ (normal) 2+ (normal)  Popliteal 2+ (normal) 2+ (normal)  DP 2+ (normal) 2+ (normal)  PT 2+ (normal) 2+ (normal)   Extremities: without varicose veins, without reticular veins, with edema of right lower extremity, without stasis pigmentation, without lipodermatosclerosis, without ulcers Musculoskeletal: no muscle wasting or atrophy  Neurologic: A&O X 3;  No focal weakness or paresthesias are detected Psychiatric:  The pt has Normal affect.  Non-invasive Vascular Imaging   BLE Venous Insufficiency Duplex (06/03/20):   RLE:   No DVT and SVT  GSV reflux from SFJ to proximal calf  GSV diameter 0.44-0.88  No SSV reflux   deep venous reflux CFV and popliteal  11/14/19:  FINDINGS: VENOUS  Normal compressibility of the common femoral, superficial femoral, and popliteal veins, as well as the visualized calf veins. Visualized portions of profunda femoral vein and great saphenous vein unremarkable. No filling defects to suggest DVT on grayscale or color Doppler imaging. Doppler waveforms show normal direction of venous flow, normal respiratory phasicity and response to augmentation.  Limited views of the contralateral common femoral vein are unremarkable.  OTHER  There is diffuse subcutaneous soft tissue edema of the calf.  Limitations: none  IMPRESSION: No femoropopliteal DVT nor evidence of DVT within the visualized calf veins.  EXAM: 11/14/19 CT OF THE LOWER RIGHT EXTREMITY WITH CONTRAST  TECHNIQUE: Multidetector CT imaging of the lower right extremity was performed according to the standard protocol following intravenous contrast administration.  COMPARISON:  Radiographs dated 11/14/2019  CONTRAST:  43mL OMNIPAQUE IOHEXOL 300 MG/ML  SOLN  FINDINGS: Bones/Joint/Cartilage  No significant bone abnormality. Right total knee prosthesis. No ankle effusion.  Muscles and Tendons  The muscles and tendons of the right lower leg  appear normal.  Soft tissues  There circumferential subcutaneous edema throughout the lower leg extending onto the dorsum of the foot.  There is no discrete abscess. No appreciable myositis.  IMPRESSION: 1. Diffuse subcutaneous edema throughout the right lower leg extending onto the dorsum of the foot consistent with cellulitis. 2. No discrete abscess. 3. No evidence of myositis or osteomyelitis.    Medical Decision Making   ABED SCHAR is a 64 y.o. male who presents with: RLE chronic venous insufficiency with swelling. Based on today's duplex he has significant deep and superficial reflux of the right lower extremity. His GSV is of adequate size for ablation  Based on the patient's history and examination, I recommend thigh high compression stockings, exercise therapy and elevation. He will follow up in 3 months to discuss venous ablation  I discussed with the patient the use of her 20-30 mm thigh high compression stockings and need for 3 month trial of such.  The patient will follow up in 3 months with Dr. Oneida Alar or Dr. Scot Dock  Thank you for allowing [place] to participate in this patient's care.   Karoline Caldwell, PA-C [facility] Office: [phone]  06/03/2020, 2:39 PM  Clinic MD: Dr. Trula Slade

## 2020-06-06 ENCOUNTER — Other Ambulatory Visit: Payer: Self-pay | Admitting: Family Medicine

## 2020-06-11 ENCOUNTER — Telehealth (INDEPENDENT_AMBULATORY_CARE_PROVIDER_SITE_OTHER): Payer: Medicare Other | Admitting: Family Medicine

## 2020-06-11 ENCOUNTER — Telehealth: Payer: Self-pay | Admitting: Family Medicine

## 2020-06-11 ENCOUNTER — Other Ambulatory Visit: Payer: Self-pay

## 2020-06-11 ENCOUNTER — Encounter: Payer: Self-pay | Admitting: Family Medicine

## 2020-06-11 VITALS — BP 137/84 | Resp 16 | Ht 67.5 in | Wt 286.0 lb

## 2020-06-11 DIAGNOSIS — R7303 Prediabetes: Secondary | ICD-10-CM

## 2020-06-11 DIAGNOSIS — I10 Essential (primary) hypertension: Secondary | ICD-10-CM

## 2020-06-11 DIAGNOSIS — R7301 Impaired fasting glucose: Secondary | ICD-10-CM

## 2020-06-11 DIAGNOSIS — E785 Hyperlipidemia, unspecified: Secondary | ICD-10-CM

## 2020-06-11 DIAGNOSIS — Z0000 Encounter for general adult medical examination without abnormal findings: Secondary | ICD-10-CM

## 2020-06-11 DIAGNOSIS — M7989 Other specified soft tissue disorders: Secondary | ICD-10-CM

## 2020-06-11 NOTE — Patient Instructions (Signed)
Christopher Lambert , Thank you for taking time to come for your Medicare Wellness Visit. I appreciate your ongoing commitment to your health goals. Please review the following plan we discussed and let me know if I can assist you in the future.   Screening recommendations/referrals: Colonoscopy: due 2029 Recommended yearly ophthalmology/optometry visit for glaucoma screening and checkup Recommended yearly dental visit for hygiene and checkup  Vaccinations: Influenza vaccine: scheduled 06/14/2020  Pneumococcal vaccine: up to date  Tdap vaccine: not covered as a preventative vaccine Shingles vaccine: info on vaccine will be mailed    Advanced directives:   Conditions/risks identified: prevent falls, prediabetes, HTN   Next appointment: AWV in 1 year  Preventive Care 40-64 Years, Male Preventive care refers to lifestyle choices and visits with your health care provider that can promote health and wellness. What does preventive care include?  A yearly physical exam. This is also called an annual well check.  Dental exams once or twice a year.  Routine eye exams. Ask your health care provider how often you should have your eyes checked.  Personal lifestyle choices, including:  Daily care of your teeth and gums.  Regular physical activity.  Eating a healthy diet.  Avoiding tobacco and drug use.  Limiting alcohol use.  Practicing safe sex.  Taking low-dose aspirin every day starting at age 26. What happens during an annual well check? The services and screenings done by your health care provider during your annual well check will depend on your age, overall health, lifestyle risk factors, and family history of disease. Counseling  Your health care provider may ask you questions about your:  Alcohol use.  Tobacco use.  Drug use.  Emotional well-being.  Home and relationship well-being.  Sexual activity.  Eating habits.  Work and work Statistician. Screening  You may have  the following tests or measurements:  Height, weight, and BMI.  Blood pressure.  Lipid and cholesterol levels. These may be checked every 5 years, or more frequently if you are over 36 years old.  Skin check.  Lung cancer screening. You may have this screening every year starting at age 87 if you have a 30-pack-year history of smoking and currently smoke or have quit within the past 15 years.  Fecal occult blood test (FOBT) of the stool. You may have this test every year starting at age 20.  Flexible sigmoidoscopy or colonoscopy. You may have a sigmoidoscopy every 5 years or a colonoscopy every 10 years starting at age 63.  Prostate cancer screening. Recommendations will vary depending on your family history and other risks.  Hepatitis C blood test.  Hepatitis B blood test.  Sexually transmitted disease (STD) testing.  Diabetes screening. This is done by checking your blood sugar (glucose) after you have not eaten for a while (fasting). You may have this done every 1-3 years. Discuss your test results, treatment options, and if necessary, the need for more tests with your health care provider. Vaccines  Your health care provider may recommend certain vaccines, such as:  Influenza vaccine. This is recommended every year.  Tetanus, diphtheria, and acellular pertussis (Tdap, Td) vaccine. You may need a Td booster every 10 years.  Zoster vaccine. You may need this after age 105.  Pneumococcal 13-valent conjugate (PCV13) vaccine. You may need this if you have certain conditions and have not been vaccinated.  Pneumococcal polysaccharide (PPSV23) vaccine. You may need one or two doses if you smoke cigarettes or if you have certain conditions. Talk to  your health care provider about which screenings and vaccines you need and how often you need them. This information is not intended to replace advice given to you by your health care provider. Make sure you discuss any questions you have  with your health care provider. Document Released: 09/27/2015 Document Revised: 05/20/2016 Document Reviewed: 07/02/2015 Elsevier Interactive Patient Education  2017 [employer] Prevention in the Home Falls can cause injuries. They can happen to people of all ages. There are many things you can do to make your home safe and to help prevent falls. What can I do on the outside of my home?  Regularly fix the edges of walkways and driveways and fix any cracks.  Remove anything that might make you trip as you walk through a door, such as a raised step or threshold.  Trim any bushes or trees on the path to your home.  Use bright outdoor lighting.  Clear any walking paths of anything that might make someone trip, such as rocks or tools.  Regularly check to see if handrails are loose or broken. Make sure that both sides of any steps have handrails.  Any raised decks and porches should have guardrails on the edges.  Have any leaves, snow, or ice cleared regularly.  Use sand or salt on walking paths during winter.  Clean up any spills in your garage right away. This includes oil or grease spills. What can I do in the bathroom?  Use night lights.  Install grab bars by the toilet and in the tub and shower. Do not use towel bars as grab bars.  Use non-skid mats or decals in the tub or shower.  If you need to sit down in the shower, use a plastic, non-slip stool.  Keep the floor dry. Clean up any water that spills on the floor as soon as it happens.  Remove soap buildup in the tub or shower regularly.  Attach bath mats securely with double-sided non-slip rug tape.  Do not have throw rugs and other things on the floor that can make you trip. What can I do in the bedroom?  Use night lights.  Make sure that you have a light by your bed that is easy to reach.  Do not use any sheets or blankets that are too big for your bed. They should not hang down onto the floor.  Have a  firm chair that has side arms. You can use this for support while you get dressed.  Do not have throw rugs and other things on the floor that can make you trip. What can I do in the kitchen?  Clean up any spills right away.  Avoid walking on wet floors.  Keep items that you use a lot in easy-to-reach places.  If you need to reach something above you, use a strong step stool that has a grab bar.  Keep electrical cords out of the way.  Do not use floor polish or wax that makes floors slippery. If you must use wax, use non-skid floor wax.  Do not have throw rugs and other things on the floor that can make you trip. What can I do with my stairs?  Do not leave any items on the stairs.  Make sure that there are handrails on both sides of the stairs and use them. Fix handrails that are broken or loose. Make sure that handrails are as long as the stairways.  Check any carpeting to make sure that it  is firmly attached to the stairs. Fix any carpet that is loose or worn.  Avoid having throw rugs at the top or bottom of the stairs. If you do have throw rugs, attach them to the floor with carpet tape.  Make sure that you have a light switch at the top of the stairs and the bottom of the stairs. If you do not have them, ask someone to add them for you. What else can I do to help prevent falls?  Wear shoes that:  Do not have high heels.  Have rubber bottoms.  Are comfortable and fit you well.  Are closed at the toe. Do not wear sandals.  If you use a stepladder:  Make sure that it is fully opened. Do not climb a closed stepladder.  Make sure that both sides of the stepladder are locked into place.  Ask someone to hold it for you, if possible.  Clearly mark and make sure that you can see:  Any grab bars or handrails.  First and last steps.  Where the edge of each step is.  Use tools that help you move around (mobility aids) if they are needed. These  include:  Canes.  Walkers.  Scooters.  Crutches.  Turn on the lights when you go into a dark area. Replace any light bulbs as soon as they burn out.  Set up your furniture so you have a clear path. Avoid moving your furniture around.  If any of your floors are uneven, fix them.  If there are any pets around you, be aware of where they are.  Review your medicines with your doctor. Some medicines can make you feel dizzy. This can increase your chance of falling. Ask your doctor what other things that you can do to help prevent falls. This information is not intended to replace advice given to you by your health care provider. Make sure you discuss any questions you have with your health care provider. Document Released: 06/27/2009 Document Revised: 02/06/2016 Document Reviewed: 10/05/2014 Elsevier Interactive Patient Education  2017 [employer].

## 2020-06-11 NOTE — Progress Notes (Signed)
Subjective:   Christopher Lambert is a 64 y.o. male who presents for Medicare Annual/Subsequent preventive examination.  Review of Systems     Cardiac Risk Factors include: advanced age (>53men, >84 women);hypertension;dyslipidemia     Objective:    Today's Vitals   06/11/20 1034  BP: 137/84  Resp: 16  Weight: 286 lb (129.7 kg)  Height: 5' 7.5" (1.715 m)  PainSc: 0-No pain   Body mass index is 44.13 kg/m.  Advanced Directives 11/14/2019 11/04/2019 04/16/2019 04/16/2019 03/23/2018 05/20/2012 05/18/2012  Does Patient Have a Medical Advance Directive? No No No No No Patient does not have advance directive -  Would patient like information on creating a medical advance directive? No - Patient declined - Yes (Inpatient - patient requests chaplain consult to create a medical advance directive) - - Advance directive packet given -  Pre-existing out of facility DNR order (yellow form or pink MOST form) - - - - - No No    Current Medications (verified) Outpatient Encounter Medications as of 06/11/2020  Medication Sig  . allopurinol (ZYLOPRIM) 300 MG tablet Take 1 tablet (300 mg total) by mouth daily.  Marland Kitchen aspirin 81 MG EC tablet TAKE 1 TABLET EVERY DAY (Patient taking differently: Take 81 mg by mouth daily. )  . cholecalciferol (VITAMIN D) 1000 UNITS tablet Take 1,000 Units by mouth daily.    Marland Kitchen ELDERBERRY PO Take 1 tablet by mouth daily.   . fluticasone (FLONASE) 50 MCG/ACT nasal spray PLACE 2 SPRAYS INTO BOTH NOSTRILS 2 (TWO) TIMES DAILY.  . furosemide (LASIX) 20 MG tablet Take 1 tablet (20 mg total) by mouth daily.  . metFORMIN (GLUCOPHAGE) 500 MG tablet Take 1 tablet (500 mg total) by mouth daily with breakfast.  . Multiple Vitamin (MULTIVITAMIN WITH MINERALS) TABS tablet Take 1 tablet by mouth daily.  Marland Kitchen NIFEdipine (PROCARDIA XL/NIFEDICAL-XL) 90 MG 24 hr tablet TAKE 1 TABLET BY MOUTH EVERY DAY  . potassium chloride (KLOR-CON) 10 MEQ tablet TAKE 2 TABLETS BY MOUTH DAILY  . simvastatin (ZOCOR) 5 MG  tablet TAKE 1 TABLET BY MOUTH EVERYDAY AT BEDTIME   No facility-administered encounter medications on file as of 06/11/2020.    Allergies (verified) Patient has no known allergies.   History: Past Medical History:  Diagnosis Date  . Abnormal cardiovascular function study 09/21/2011  . Arthritis of knee    Bilateral  . 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 ED (erectile dysfunction)   . Essential hypertension, benign 1990  . H/O: whooping cough    aschild  . Hemorrhoids, internal   . Metabolic syndrome 03/13/1600  . Obesity   . Obstructive sleep apnea 11/09/2007   NPSG 08/26/04 AHI 92/hr, [place], [place] NPSG 01/31/15- severe OSA AHI 49.4/ hr, CPAP suggested 13, AHI 0.0, weight 294 lbs CPAP Advanced    . Palpitations 08/24/2011  . Piles (hemorrhoids) 06/19/2014  . Seasonal allergies   . Sleep apnea 2004   wears CPAP nightly     Past Surgical History:  Procedure Laterality Date  . CARDIAC CATHETERIZATION  09/25/2011  . CARPAL TUNNEL RELEASE     Right   . COLONOSCOPY N/A 03/23/2018   Procedure: COLONOSCOPY;  Surgeon: Daneil Dolin, MD;  Location: [facility];  Service: Endoscopy;  Laterality: N/A;  8:00  . HERNIA REPAIR  0932 approx   umbilical  . JOINT REPLACEMENT  2011   right knee  . JOINT REPLACEMENT  2013   left knee  .  left knee replacement   sept 2013  . POLYPECTOMY  03/23/2018   Procedure: POLYPECTOMY;  Surgeon: Daneil Dolin, MD;  Location: [facility];  Service: Endoscopy;;  colon  . Right knee replacement  06/2010  . TOTAL KNEE ARTHROPLASTY  05/18/2012   Procedure: TOTAL KNEE ARTHROPLASTY;  Surgeon: Ninetta Lights, MD;  Location: [place];  Service: Orthopedics;  Laterality: Left;  left total knee arthroplasty  . UMBILICAL HERNIA REPAIR     Family History  Problem Relation Age of Onset  . Coronary artery disease Brother 48       Premature  . Hypertension Brother   . Hypertension Father   . Cancer Father 71       colon   . Hypertension  Mother   . Diabetes Brother   . Hypertension Sister   . Hypertension Sister   . Hypertension Sister   . Hypertension Sister    Social History   Socioeconomic History  . Marital status: Married    Spouse name: [place]  . Number of children: 0  . Years of education: Not on file  . Highest education level: Not on file  Occupational History    Employer: [employer]  Tobacco Use  . Smoking status: Former Smoker    Packs/day: 0.50    Types: Cigarettes  . Smokeless tobacco: Never Used  Vaping Use  . Vaping Use: Never used  Substance and Sexual Activity  . Alcohol use: No  . Drug use: No  . Sexual activity: Yes  Other Topics Concern  . Not on file  Social History Narrative  . Not on file   Social Determinants of Health   Financial Resource Strain:   . Difficulty of Paying Living Expenses: Not on file  Food Insecurity:   . Worried About Charity fundraiser in the Last Year: Not on file  . Ran Out of Food in the Last Year: Not on file  Transportation Needs: No Transportation Needs  . Lack of Transportation (Medical): No  . Lack of Transportation (Non-Medical): No  Physical Activity: Sufficiently Active  . Days of Exercise per Week: 4 days  . Minutes of Exercise per Session: 50 min  Stress:   . Feeling of Stress : Not on file  Social Connections: Moderately Isolated  . Frequency of Communication with Friends and Family: Never  . Frequency of Social Gatherings with Friends and Family: Never  . Attends Religious Services: Never  . Active Member of Clubs or Organizations: Yes  . Attends Archivist Meetings: Never  . Marital Status: Married    Tobacco Counseling Counseling given: Not Answered not needed , no tobacco use  Clinical Intake:     Pain Score: 0-No pain           Diabeticno, he is prediabetic         Activities of Daily Living In your present state of health, do you have any difficulty performing the following  activities: 06/11/2020 11/14/2019  Hearing? N N  Vision? N N  Difficulty concentrating or making decisions? N N  Walking or climbing stairs? N N  Dressing or bathing? N N  Doing errands, shopping? N N  Preparing Food and eating ? N -  Using the Toilet? N -  In the past six months, have you accidently leaked urine? N -  Do you have problems with loss of bowel control? N -  Managing your Medications? N -  Managing your Finances?  N -  Housekeeping or managing your Housekeeping? N -  Some recent data might be hidden    Patient Care Team: [name], MD as PCP - General Deneise Lever, MD as Consulting Physician (Pulmonary Disease)  Indicate any recent Medical Services you may have received from other than [facility] providers in the past year (date may be approximate).     Assessment:   This is a routine wellness examination for [place].  Hearing/Vision screen No exam data present  Dietary issues and exercise activities discussed: Current Exercise Habits: Home exercise routine, Time (Minutes): 50, Frequency (Times/Week): 4, Weekly Exercise (Minutes/Week): 200, Intensity: Mild  Goals    . Blood Pressure < 140/90     Keep blood pressure under 120/80    . DIET - REDUCE CALORIE INTAKE    . HEMOGLOBIN A1C < 7     A1c goal is 6 or less    . Weight (lb) < 250 lb (113.4 kg)      Depression Screen PHQ 2/9 Scores 06/11/2020 03/11/2020 11/14/2019 11/07/2019 06/26/2019 05/30/2019 04/27/2019  PHQ - 2 Score 0 0 0 0 0 0 0  PHQ- 9 Score - - - - - - -    Fall Risk Fall Risk  06/11/2020 03/11/2020 11/29/2019 11/14/2019 11/07/2019  Falls in the past year? 0 0 0 0 0  Number falls in past yr: 0 0 0 0 0  Injury with Fall? 0 0 0 0 0  Risk for fall due to : - No Fall Risks - - -  Follow up - Falls evaluation completed - - -    Any stairs in or around the home? Yes  If so, are there any without handrails? No  Home free of loose throw rugs in walkways, pet beds, electrical cords, etc? No  Adequate  lighting in your home to reduce risk of falls? No   ASSISTIVE DEVICES UTILIZED TO PREVENT FALLS:   Life alert? No  Use of a cane, walker or w/c? Yes  Grab bars in the bathroom? Yes  Shower chair or bench in shower? Yes  Elevated toilet seat or a handicapped toilet? No   TIMED UP AND GO:  Was the test performed? No .  Length of time to ambulate 10 feet: 0 sec.     Cognitive Function:     6CIT Screen 06/11/2020 05/30/2019  What Year? 0 points 4 points  What month? 0 points 0 points  What time? 0 points 0 points  Count back from 20 0 points 0 points  Months in reverse 0 points 0 points  Repeat phrase 0 points 0 points  Total Score 0 4    Immunizations Immunization History  Administered Date(s) Administered  . Influenza Split 08/24/2011, 05/30/2012  . Influenza Whole 08/18/2005, 06/26/2010  . Influenza,inj,Quad PF,6+ Mos 06/23/2013, 06/19/2014, 07/14/2016, 09/01/2017, 05/09/2018, 05/08/2019  . Influenza-Unspecified 05/31/2015  . Moderna SARS-COVID-2 Vaccination 11/30/2019, 12/29/2019  . Pneumococcal Conjugate-13 10/23/2014  . Pneumococcal Polysaccharide-23 02/16/2005, 05/20/2012  . Td 02/16/2005  . Tdap 02/27/2015    TDAP status: Due, Education has been provided regarding the importance of this vaccine. Advised may receive this vaccine at local pharmacy or Health Dept. Aware to provide a copy of the vaccination record if obtained from local pharmacy or Health Dept. Verbalized acceptance and understanding. Flu Vaccine status: Up to date scheduled to get vaccine 06/14/20 Pneumococcal vaccine status: Up to date Covid-19 vaccine status: Completed vaccines  Qualifies for Shingles Vaccine? Yes   Zostavax completed No  Shingrix Completed?: No.    Education has been provided regarding the importance of this vaccine. Patient has been advised to call insurance company to determine out of pocket expense if they have not yet received this vaccine. Advised may also receive vaccine at  local pharmacy or Health Dept. Verbalized acceptance and understanding.  Screening Tests Health Maintenance  Topic Date Due  . INFLUENZA VACCINE  04/14/2020  . FOOT EXAM  04/26/2020  . HEMOGLOBIN A1C  08/31/2020  . OPHTHALMOLOGY EXAM  02/21/2021  . URINE MICROALBUMIN  03/11/2021  . TETANUS/TDAP  02/26/2025  . COLONOSCOPY  03/23/2028  . PNEUMOCOCCAL POLYSACCHARIDE VACCINE AGE 79-64 HIGH RISK  Completed  . COVID-19 Vaccine  Completed  . Hepatitis C Screening  Completed  . HIV Screening  Completed    Health Maintenance  Health Maintenance Due  Topic Date Due  . INFLUENZA VACCINE  04/14/2020  . FOOT EXAM  04/26/2020    Colorectal cancer screening: Completed yes. Repeat every 10 years  Lung Cancer Screening: (Low Dose CT Chest recommended if Age 66-80 years, 30 pack-year currently smoking OR have quit w/in 15years.) does not qualify.   Lung Cancer Screening Referral: does not qualify  Additional Screening:  Hepatitis C Screening: does qualify; one time screen completed  Vision Screening: Recommended annual ophthalmology exams for early detection of glaucoma and other disorders of the eye. Is the patient up to date with their annual eye exam?  Yes  Who is the provider or what is the name of the office in which the patient attends annual eye exams? Dr Gershon Crane If pt is not established with a provider, would they like to be referred to a provider to establish care? No .   Dental Screening: Recommended annual dental exams for proper oral hygiene  Community Resource Referral / Chronic Care Management: CRR required this visit?  No   CCM required this visit?  No      Plan:     I have personally reviewed and noted the following in the patient's chart:   . Medical and social history . Use of alcohol, tobacco or illicit drugs  . Current medications and supplements . Functional ability and status . Nutritional status . Physical activity . Advanced directives . List of  other physicians . Hospitalizations, surgeries, and ER visits in previous 12 months . Vitals . Screenings to include cognitive, depression, and falls . Referrals and appointments  In addition, I have reviewed and discussed with patient certain preventive protocols, quality metrics, and best practice recommendations. A written personalized care plan for preventive services as well as general preventive health recommendations were provided to patient.     Kate Sable, LPN, LPN   [id number]

## 2020-06-11 NOTE — Telephone Encounter (Signed)
Labs ordered and betsy to schedule appt Friday when comes for flu visit

## 2020-06-11 NOTE — Telephone Encounter (Signed)
Pt to receive appointment for annual physical exam in office with me in early December.He is coming in foir flu vaccine on Friday and can be given this at that time Needs fasting ;lipid, cmp and eGFr , hBA1C and tSH 5 days before that appointment

## 2020-06-14 ENCOUNTER — Ambulatory Visit (INDEPENDENT_AMBULATORY_CARE_PROVIDER_SITE_OTHER): Payer: Medicare Other

## 2020-06-14 ENCOUNTER — Other Ambulatory Visit: Payer: Self-pay

## 2020-06-14 DIAGNOSIS — Z23 Encounter for immunization: Secondary | ICD-10-CM | POA: Diagnosis not present

## 2020-06-15 ENCOUNTER — Encounter: Payer: Self-pay | Admitting: Family Medicine

## 2020-06-25 ENCOUNTER — Other Ambulatory Visit: Payer: Self-pay | Admitting: Family Medicine

## 2020-08-15 LAB — CMP14+EGFR
ALT: 23 IU/L (ref 0–44)
AST: 18 IU/L (ref 0–40)
Albumin/Globulin Ratio: 1.5 (ref 1.2–2.2)
Albumin: 4.4 g/dL (ref 3.8–4.8)
Alkaline Phosphatase: 50 IU/L (ref 44–121)
BUN/Creatinine Ratio: 13 (ref 10–24)
BUN: 13 mg/dL (ref 8–27)
Bilirubin Total: 1 mg/dL (ref 0.0–1.2)
CO2: 25 mmol/L (ref 20–29)
Calcium: 8.9 mg/dL (ref 8.6–10.2)
Chloride: 102 mmol/L (ref 96–106)
Creatinine, Ser: 0.97 mg/dL (ref 0.76–1.27)
GFR calc Af Amer: 96 mL/min/{1.73_m2} (ref >59)
GFR calc non Af Amer: 83 mL/min/{1.73_m2} (ref >59)
Globulin, Total: 2.9 g/dL (ref 1.5–4.5)
Glucose: 122 mg/dL — ABNORMAL HIGH (ref 65–99)
Potassium: 3.7 mmol/L (ref 3.5–5.2)
Sodium: 141 mmol/L (ref 134–144)
Total Protein: 7.3 g/dL (ref 6.0–8.5)

## 2020-08-15 LAB — HEMOGLOBIN A1C
Est. average glucose Bld gHb Est-mCnc: 128 mg/dL
Hgb A1c MFr Bld: 6.1 % — ABNORMAL HIGH (ref 4.8–5.6)

## 2020-08-15 LAB — TSH: TSH: 1.78 u[IU]/mL (ref 0.450–4.500)

## 2020-08-15 LAB — LIPID PANEL
Chol/HDL Ratio: 3.4 ratio (ref 0.0–5.0)
Cholesterol, Total: 153 mg/dL (ref 100–199)
HDL: 45 mg/dL (ref >39)
LDL Chol Calc (NIH): 93 mg/dL (ref 0–99)
Triglycerides: 80 mg/dL (ref 0–149)
VLDL Cholesterol Cal: 15 mg/dL (ref 5–40)

## 2020-08-20 ENCOUNTER — Other Ambulatory Visit: Payer: Self-pay

## 2020-08-20 ENCOUNTER — Ambulatory Visit (INDEPENDENT_AMBULATORY_CARE_PROVIDER_SITE_OTHER): Payer: Medicare Other | Admitting: Family Medicine

## 2020-08-20 ENCOUNTER — Encounter: Payer: Self-pay | Admitting: Family Medicine

## 2020-08-20 VITALS — BP 134/84 | HR 68 | Resp 15 | Ht 68.0 in | Wt 284.0 lb

## 2020-08-20 DIAGNOSIS — I10 Essential (primary) hypertension: Secondary | ICD-10-CM

## 2020-08-20 DIAGNOSIS — Z0000 Encounter for general adult medical examination without abnormal findings: Secondary | ICD-10-CM | POA: Insufficient documentation

## 2020-08-20 DIAGNOSIS — R7303 Prediabetes: Secondary | ICD-10-CM

## 2020-08-20 DIAGNOSIS — E785 Hyperlipidemia, unspecified: Secondary | ICD-10-CM

## 2020-08-20 NOTE — Assessment & Plan Note (Signed)

## 2020-08-20 NOTE — Patient Instructions (Signed)
F/u in office with mD in 6 months, call if you need me sooner  Weight loss goal of 6 pounds  Fasting lipid, cmp and EGFr and hBA1C 1 week before follow up in 5.5 months  No medication changes  Please schedule and keep appointment with Urology  It is important that you exercise regularly at least 30 minutes 5 times a week. If you develop chest pain, have severe difficulty breathing, or feel very tired, stop exercising immediately and seek medical attention  Think about what you will eat, plan ahead. Choose " clean, green, fresh or frozen" over canned, processed or packaged foods which are more sugary, salty and fatty. 70 to 75% of food eaten should be vegetables and fruit. Three meals at set times with snacks allowed between meals, but they must be fruit or vegetables. Aim to eat over a 12 hour period , example 7 am to 7 pm, and STOP after  your last meal of the day. Drink water,generally about 64 ounces per day, no other drink is as healthy. Fruit juice is best enjoyed in a healthy way, by EATING the fruit. Thanks for choosing [facility], we consider it a privelige to serve you.

## 2020-08-20 NOTE — Progress Notes (Signed)
    Christopher Lambert     MRN: [id number]      DOB: February 24, 1956   HPI: Patient is in for annual physical exam. No other health concerns are expressed or addressed at the visit. Recent labs, if available are reviewed. Immunization is reviewed , and  updated if needed.    PE; BP 134/84   Pulse 68   Resp 15   Ht 5\' 8"  (1.727 m)   Wt 284 lb (128.8 kg)   SpO2 96%   BMI 43.18 kg/m  Pleasant male, alert and oriented x 3, in no cardio-pulmonary distress. Afebrile. HEENT No facial trauma or asymetry. Sinuses non tender. EOMI External ears normal,  Neck: supple, no adenopathy,JVD or thyromegaly.No bruits.  Chest: Clear to ascultation bilaterally.No crackles or wheezes. Non tender to palpation  Cardiovascular system; Heart sounds normal,  S1 and  S2 ,no S3.  No murmur, or thrill. Apical beat not displaced Peripheral pulses normal.  Abdomen: Soft, non tender, no organomegaly or masses. No bruits. Bowel sounds normal. No guarding, tenderness or rebound.    Musculoskeletal exam: Decreased though adequate ROM of spine, hips , shoulders and knees. No deformity ,swelling or crepitus noted. No muscle wasting or atrophy.   Neurologic: Cranial nerves 2 to 12 intact. Power, tone ,sensation and reflexes normal throughout. No disturbance in gait. No tremor.  Skin: Intact, no ulceration, erythema , scaling or rash noted. Pigmentation normal throughout  Psych; Normal mood and affect. Judgement and concentration normal   Assessment & Plan:  Annual physical exam Annual exam as documented. Counseling done  re healthy lifestyle involving commitment to 150 minutes exercise per week, heart healthy diet, and attaining healthy weight.The importance of adequate sleep also discussed. Regular seat belt use and home safety, is also discussed. Changes in health habits are decided on by the patient with goals and time frames  set for achieving them. Immunization and cancer screening needs  are specifically addressed at this visit.   Morbid obesity ([place])  Patient re-educated about  the importance of commitment to a  minimum of 150 minutes of exercise per week as able.  The importance of healthy food choices with portion control discussed, as well as eating regularly and within a 12 hour window most days. The need to choose "clean , green" food 50 to 75% of the time is discussed, as well as to make water the primary drink and set a goal of 64 ounces water daily.    Weight /BMI 08/20/2020 06/11/2020 06/03/2020  WEIGHT 284 lb 286 lb 286 lb 12.8 oz  HEIGHT 5\' 8"  5' 7.5" 5' 7.5"  BMI 43.18 kg/m2 44.13 kg/m2 44.26 kg/m2

## 2020-08-21 ENCOUNTER — Encounter: Payer: Self-pay | Admitting: Family Medicine

## 2020-08-21 NOTE — Assessment & Plan Note (Signed)
  Patient re-educated about  the importance of commitment to a  minimum of 150 minutes of exercise per week as able.  The importance of healthy food choices with portion control discussed, as well as eating regularly and within a 12 hour window most days. The need to choose "clean , green" food 50 to 75% of the time is discussed, as well as to make water the primary drink and set a goal of 64 ounces water daily.    Weight /BMI 08/20/2020 06/11/2020 06/03/2020  WEIGHT 284 lb 286 lb 286 lb 12.8 oz  HEIGHT 5\' 8"  5' 7.5" 5' 7.5"  BMI 43.18 kg/m2 44.13 kg/m2 44.26 kg/m2

## 2020-08-26 ENCOUNTER — Other Ambulatory Visit: Payer: Self-pay | Admitting: Family Medicine

## 2020-08-29 ENCOUNTER — Ambulatory Visit: Payer: Medicare Other | Admitting: Vascular Surgery

## 2020-09-05 ENCOUNTER — Ambulatory Visit (INDEPENDENT_AMBULATORY_CARE_PROVIDER_SITE_OTHER): Payer: Medicare Other | Admitting: Vascular Surgery

## 2020-09-05 ENCOUNTER — Encounter: Payer: Self-pay | Admitting: Vascular Surgery

## 2020-09-05 ENCOUNTER — Other Ambulatory Visit: Payer: Self-pay

## 2020-09-05 VITALS — BP 144/85 | HR 61 | Temp 98.2°F | Resp 18 | Ht 68.4 in | Wt 283.1 lb

## 2020-09-05 DIAGNOSIS — R600 Localized edema: Secondary | ICD-10-CM

## 2020-09-05 DIAGNOSIS — I872 Venous insufficiency (chronic) (peripheral): Secondary | ICD-10-CM

## 2020-09-05 NOTE — Progress Notes (Signed)
REASON FOR VISIT:   Follow-up of right lower extremity swelling.  MEDICAL ISSUES:   CHRONIC VENOUS INSUFFICIENCY: This patient has CEAP C4a venous disease.  He continues to have symptoms despite conservative measures including leg elevation, thigh-high compression stockings, and exercise.  Based on my review of his duplex in my exam of the right great saphenous vein with the SonoSite, I think he would benefit from laser ablation of the right great saphenous vein to the distal thigh or proximal calf.  He has significant reflux throughout. I have discussed the indications for endovenous laser ablation of the right GSV, that is to lower the pressure in the veins and potentially help relieve the symptoms from venous hypertension. I have also discussed alternative options including conservative treatment with leg elevation, compression therapy, exercise, avoiding prolonged sitting and standing, and weight management. I have discussed the potential complications of the procedure, including, but not limited to: bleeding, bruising, leg swelling, nerve injury, skin burns, significant pain from phlebitis, deep venous thrombosis, or failure of the vein to close.  I have also explained that venous insufficiency is a chronic disease, and that the patient is at risk for recurrent varicose veins in the future.  All of the patient's questions were encouraged and answered. They are agreeable to proceed.    HPI:   Christopher Lambert is a pleasant 64 y.o. male who was seen by Karoline Caldwell, [place] on 06/03/2020.  He presented for right lower extremity swelling which has been present for 8 months.  He apparently had a duplex which showed no evidence of DVT.  The swelling increases as the day progresses.  Had he had been wearing knee-high compression stockings with a gradient of 15 to 20 mmHg.  He complained of heaviness in the right leg aggravated by standing.  Venous duplex at that time showed no evidence of DVT.  He was felt  to have significant chronic venous insufficiency (CEAP C3 venous disease), and hands reflux in both the deep and superficial system.  He was prescribed thigh-high compression stockings with a gradient of 20 to 30 mmHg, encouraged to elevate his legs, and exercise.  Comes in for 65-month follow-up visit.  On my history, the patient denies any history of previous venous procedures or previous history of DVT.  He has been wearing his thigh-high compression stockings which help with his symptoms some but he is having persistent symptoms.  He does elevate his legs which helps some.  He describes aching pain and heaviness in his right leg which is aggravated by standing.  His symptoms are worse at the end of the day.  The symptoms are alleviated by elevation and somewhat with compression therapy.  He is currently retired but had worked at a job where he was standing for long hours.  Patient does have a history of diabetes and hypertension.  He denies any history of hypercholesterolemia, family history of premature cardiovascular disease, or tobacco use.  Past Medical History:  Diagnosis Date  . Abnormal cardiovascular function study 09/21/2011  . Arthritis of knee    Bilateral  . 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 Diabetes mellitus without complication ([place])    Phreesia 08/18/2020  . ED (erectile dysfunction)   . Essential hypertension, benign 1990  . H/O: whooping cough    aschild  . Hemorrhoids, internal   . Hypertension    Phreesia 08/18/2020  . Metabolic syndrome [id number]  . Obesity   .  Obstructive sleep apnea 11/09/2007   NPSG 08/26/04 AHI 92/hr, [place], [place] NPSG 01/31/15- severe OSA AHI 49.4/ hr, CPAP suggested 13, AHI 0.0, weight 294 lbs CPAP Advanced    . Palpitations 08/24/2011  . Piles (hemorrhoids) 06/19/2014  . Seasonal allergies   . Sleep apnea 2004   wears CPAP nightly      Family History  Problem Relation Age of Onset  . Coronary artery disease Brother  48       Premature  . Hypertension Brother   . Hypertension Father   . Cancer Father 9       colon   . Hypertension Mother   . Diabetes Brother   . Hypertension Sister   . Hypertension Sister   . Hypertension Sister   . Hypertension Sister     SOCIAL HISTORY: Social History   Tobacco Use  . Smoking status: Former Smoker    Packs/day: 0.50    Types: Cigarettes  . Smokeless tobacco: Never Used  Substance Use Topics  . Alcohol use: No    No Known Allergies  Current Outpatient Medications  Medication Sig Dispense Refill  . allopurinol (ZYLOPRIM) 300 MG tablet TAKE 1 TABLET BY MOUTH EVERY DAY 90 tablet 1  . aspirin 81 MG EC tablet TAKE 1 TABLET EVERY DAY (Patient taking differently: Take 81 mg by mouth daily. ) 150 tablet 3  . cholecalciferol (VITAMIN D) 1000 UNITS tablet Take 1,000 Units by mouth daily.      Marland Kitchen ELDERBERRY PO Take 1 tablet by mouth daily.     . fluticasone (FLONASE) 50 MCG/ACT nasal spray PLACE 2 SPRAYS INTO BOTH NOSTRILS 2 (TWO) TIMES DAILY. 48 mL 1  . furosemide (LASIX) 20 MG tablet Take 1 tablet (20 mg total) by mouth daily. 90 tablet 2  . metFORMIN (GLUCOPHAGE) 500 MG tablet Take 1 tablet (500 mg total) by mouth daily with breakfast. 90 tablet 1  . Multiple Vitamin (MULTIVITAMIN WITH MINERALS) TABS tablet Take 1 tablet by mouth daily.    Marland Kitchen NIFEdipine (PROCARDIA XL/NIFEDICAL-XL) 90 MG 24 hr tablet TAKE 1 TABLET BY MOUTH EVERY DAY 90 tablet 1  . potassium chloride (KLOR-CON) 10 MEQ tablet TAKE 2 TABLETS BY MOUTH EVERY DAY 180 tablet 0  . simvastatin (ZOCOR) 5 MG tablet TAKE 1 TABLET BY MOUTH EVERYDAY AT BEDTIME 90 tablet 2   No current facility-administered medications for this visit.    REVIEW OF SYSTEMS:  [X]  denotes positive finding, [ ]  denotes negative finding Cardiac  Comments:  Chest pain or chest pressure:    Shortness of breath upon exertion:    Short of breath when lying flat:    Irregular heart rhythm:        Vascular    Pain in calf,  thigh, or hip brought on by ambulation:    Pain in feet at night that wakes you up from your sleep:     Blood clot in your veins:    Leg swelling:  x       Pulmonary    Oxygen at home:    Productive cough:     Wheezing:         Neurologic    Sudden weakness in arms or legs:     Sudden numbness in arms or legs:     Sudden onset of difficulty speaking or slurred speech:    Temporary loss of vision in one eye:     Problems with dizziness:         Gastrointestinal  Blood in stool:     Vomited blood:         Genitourinary    Burning when urinating:     Blood in urine:        Psychiatric    Major depression:         Hematologic    Bleeding problems:    Problems with blood clotting too easily:        Skin    Rashes or ulcers:        Constitutional    Fever or chills:     PHYSICAL EXAM:   Vitals:   09/05/20 1301  BP: (!) 144/85  Pulse: 61  Resp: 18  Temp: 98.2 F (36.8 C)  TempSrc: Temporal  SpO2: 99%  Weight: 283 lb 1.6 oz (128.4 kg)  Height: 5' 8.4" (1.737 m)    GENERAL: The patient is a well-nourished male, in no acute distress. The vital signs are documented above. CARDIAC: There is a regular rate and rhythm.  VASCULAR: I do not detect carotid bruits. He has palpable dorsalis pedis pulses bilaterally. He has significant right lower extremity swelling.  The left calf measures 48 cm in circumference.  The right calf measures 51 cm in circumference. He has hyperpigmentation bilaterally consistent with CEAP C4a     PULMONARY: There is good air exchange bilaterally without wheezing or rales. ABDOMEN: Soft and non-tender with normal pitched bowel sounds.  MUSCULOSKELETAL: There are no major deformities or cyanosis. NEUROLOGIC: No focal weakness or paresthesias are detected. SKIN: There are no ulcers or rashes noted. PSYCHIATRIC: The patient has a normal affect.  DATA:    VENOUS DUPLEX: I reviewed his venous duplex scan that was done in September.  This  was of the right lower extremity only.  He had no evidence of DVT or superficial venous thrombosis.  He had deep venous reflux involving the common femoral vein and popliteal vein  He had superficial venous reflux in the right great saphenous vein from the saphenofemoral junction to the proximal calf.  Diameters range from 0.43-0.57 cm.  Deitra Mayo [facility] [phone]

## 2020-09-19 ENCOUNTER — Encounter: Payer: Self-pay | Admitting: Vascular Surgery

## 2020-09-24 ENCOUNTER — Other Ambulatory Visit: Payer: Self-pay | Admitting: *Deleted

## 2020-09-24 DIAGNOSIS — I83811 Varicose veins of right lower extremities with pain: Secondary | ICD-10-CM

## 2020-09-27 ENCOUNTER — Other Ambulatory Visit: Payer: Self-pay | Admitting: Family Medicine

## 2020-10-10 ENCOUNTER — Other Ambulatory Visit: Payer: Self-pay

## 2020-10-10 ENCOUNTER — Encounter: Payer: Self-pay | Admitting: Vascular Surgery

## 2020-10-10 ENCOUNTER — Ambulatory Visit: Payer: Medicare Other | Admitting: Vascular Surgery

## 2020-10-10 VITALS — BP 133/88 | HR 80 | Temp 98.2°F | Resp 16 | Ht 67.5 in | Wt 292.0 lb

## 2020-10-10 DIAGNOSIS — I872 Venous insufficiency (chronic) (peripheral): Secondary | ICD-10-CM | POA: Diagnosis not present

## 2020-10-10 HISTORY — PX: ENDOVENOUS ABLATION SAPHENOUS VEIN W/ LASER: SUR449

## 2020-10-10 NOTE — Progress Notes (Signed)
   Patient name: JUNIPER SNYDERS MRN: [id number] DOB: 04/20/1956 Sex: male  REASON FOR VISIT: For endovenous laser ablation of the right great saphenous vein  HPI: RONDY KRUPINSKI is a 65 y.o. male whom I last saw on 09/05/2020.  He had presented with right lower extremity swelling which is been present for 8 months.  He had no evidence of DVT.  He was having significant pain in the right leg.  He failed conservative treatment.  He has CEAP C4a venous disease.  His duplex scan showed significant reflux in the right great saphenous vein from the saphenofemoral junction to the distal thigh.  Current Outpatient Medications  Medication Sig Dispense Refill  . allopurinol (ZYLOPRIM) 300 MG tablet TAKE 1 TABLET BY MOUTH EVERY DAY 90 tablet 1  . aspirin 81 MG EC tablet TAKE 1 TABLET EVERY DAY (Patient taking differently: Take 81 mg by mouth daily. ) 150 tablet 3  . cholecalciferol (VITAMIN D) 1000 UNITS tablet Take 1,000 Units by mouth daily.      Marland Kitchen ELDERBERRY PO Take 1 tablet by mouth daily.     . fluticasone (FLONASE) 50 MCG/ACT nasal spray PLACE 2 SPRAYS INTO BOTH NOSTRILS 2 (TWO) TIMES DAILY. 48 mL 1  . furosemide (LASIX) 20 MG tablet Take 1 tablet (20 mg total) by mouth daily. 90 tablet 2  . metFORMIN (GLUCOPHAGE) 500 MG tablet Take 1 tablet (500 mg total) by mouth daily with breakfast. 90 tablet 1  . Multiple Vitamin (MULTIVITAMIN WITH MINERALS) TABS tablet Take 1 tablet by mouth daily.    Marland Kitchen NIFEdipine (PROCARDIA XL/NIFEDICAL-XL) 90 MG 24 hr tablet TAKE 1 TABLET BY MOUTH EVERY DAY 90 tablet 1  . potassium chloride (KLOR-CON) 10 MEQ tablet TAKE 2 TABLETS BY MOUTH EVERY DAY 180 tablet 0  . simvastatin (ZOCOR) 5 MG tablet TAKE 1 TABLET BY MOUTH EVERYDAY AT BEDTIME 90 tablet 2   No current facility-administered medications for this visit.    PHYSICAL EXAM: Vitals:   10/10/20 0838  BP: 133/88  Pulse: 80  Resp: 16  Temp: 98.2 F (36.8 C)  TempSrc: Temporal  SpO2: 99%  Weight: 292 lb (132.5 kg)   Height: 5' 7.5" (1.715 m)    PROCEDURE: Laser ablation right great saphenous vein  TECHNIQUE: The patient was taken to the exam room and I looked at the right great saphenous vein with the SonoSite. I elected to cannulate this in the proximal calf. The right leg was prepped and draped in usual sterile fashion. After the skin was anesthetized with 1% lidocaine, under ultrasound guidance, I cannulated the right great saphenous vein with a micropuncture needle and a micropuncture sheath was introduced over a wire. I then advanced the J-wire to just below the saphenofemoral junction. A 65 cm sheath was advanced over the wire and the wire and dilator removed. The end of the sheath was positioned 2.5 cm distal to the saphenofemoral junction. I then positioned the laser fiber at the end of the sheath and retracted the sheath. Tumescent anesthesia was administered compressional around the vein the entire length. The patient was then placed in Trendelenburg. Laser glasses were placed. Laser ablation was performed of the right great saphenous vein from 2.5 cm distal to the saphenofemoral junction to the proximal calf. 42 cm of vein were treated. Patient tolerated the procedure well. Pressure dressing was applied. He will return in 2 weeks for a follow-up duplex.  Deitra Mayo [facility] [place] [phone]

## 2020-10-10 NOTE — Progress Notes (Signed)
     Laser Ablation Procedure    Date: 10/10/2020   Christopher Lambert DOB:07/08/1956  Consent signed: Yes      Surgeon: Gae Gallop MD   Procedure: Laser Ablation: right Greater Saphenous Vein  BP 133/88 (BP Location: Left Arm, Patient Position: Sitting, Cuff Size: Large)   Pulse 80   Temp 98.2 F (36.8 C) (Temporal)   Resp 16   Ht 5' 7.5" (1.715 m)   Wt 292 lb (132.5 kg)   SpO2 99%   BMI 45.06 kg/m   Tumescent Anesthesia: 450 cc 0.9% NaCl with 50 cc Lidocaine HCL 1%  and 15 cc 8.4% NaHCO3  Local Anesthesia: 4 cc Lidocaine HCL and NaHCO3 (ratio 2:1)  7 watts continuous mode     Total energy: 1949 Joules    Total time: 278 seconds Treatment Length  41 cm   Laser Fiber Ref. #   [id number]                            Lot #  [id number]    Patient tolerated procedure well  Notes: Patient wore face mask.  All staff members wore facial masks and facial shields/goggles.    Description of Procedure:  After marking the course of the secondary varicosities, the patient was placed on the operating table in the supine position, and the right leg was prepped and draped in sterile fashion.   Local anesthetic was administered and under ultrasound guidance the saphenous vein was accessed with a micro needle and guide wire; then the mirco puncture sheath was placed.  A guide wire was inserted saphenofemoral junction , followed by a 5 french sheath.  The position of the sheath and then the laser fiber below the junction was confirmed using the ultrasound.  Tumescent anesthesia was administered along the course of the saphenous vein using ultrasound guidance. The patient was placed in Trendelenburg position and protective laser glasses were placed on patient and staff, and the laser was fired at 7 watts continuous mode for a total of 1949  joules.       Steri strip was applied to the IV insertion site and ABD pads and thigh high compression stockings were applied.  Ace wrap bandages were applied  right thigh and calf and at the top of the saphenofemoral junction. Blood loss was less than 15 cc.  Discharge instructions reviewed with patient and hardcopy of discharge instructions given to patient to take home. The patient ambulated out of the operating room having tolerated the procedure well.

## 2020-10-14 ENCOUNTER — Other Ambulatory Visit: Payer: Self-pay | Admitting: Family Medicine

## 2020-10-17 IMAGING — CT CT EXTREM LOW W/ CM*R*
3 of 5 series · 12 of 33 positions shown, 14 images · IV contrast (agent unspecified)
Comparison: Radiographs dated 11/14/2019

CONTRAST:  75mL OMNIPAQUE IOHEXOL 300 MG/ML  SOLN

CLINICAL DATA: Lower extremity cellulitis and edema.

EXAM:
CT OF THE LOWER RIGHT EXTREMITY WITH CONTRAST
TECHNIQUE: Multidetector CT imaging of the lower right extremity was performed
according to the standard protocol following intravenous contrast
administration.

[Series 5: axial st · axial · 0.53mm/px · z∈[+303,+713]mm · 6 of 268 slices shown, 8 images]
[im 42/268  soft-tissue]
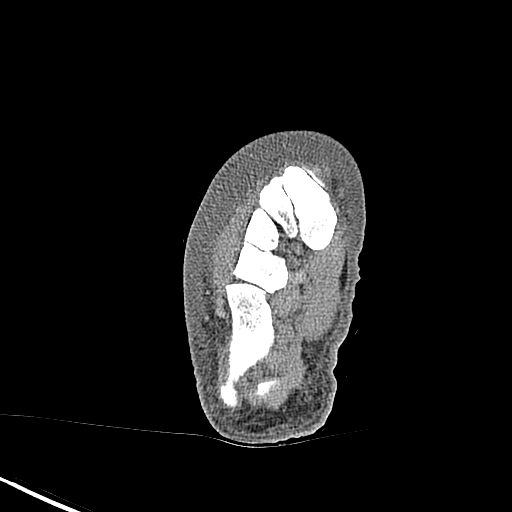
[im 42/268  bone]
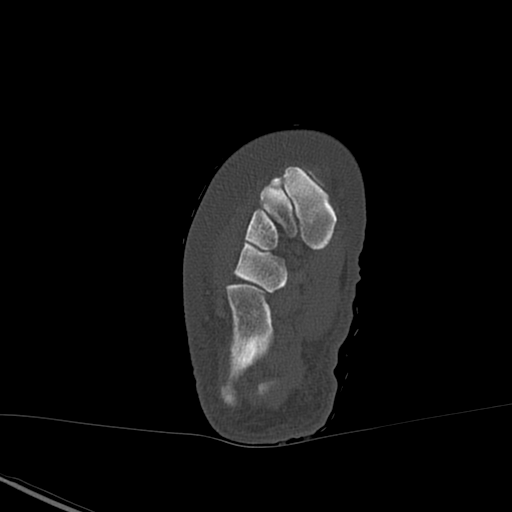
[im 83/268  bone]
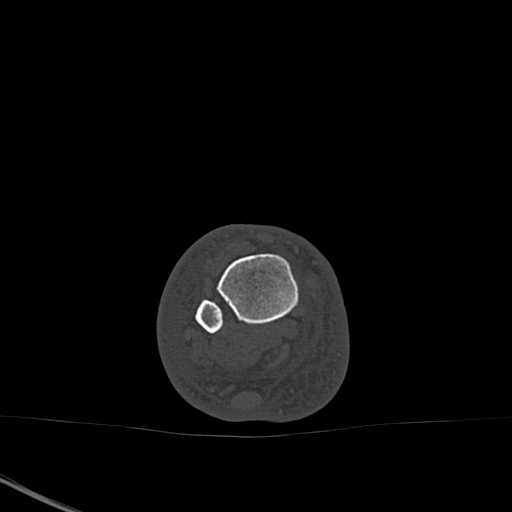
[im 124/268  bone]
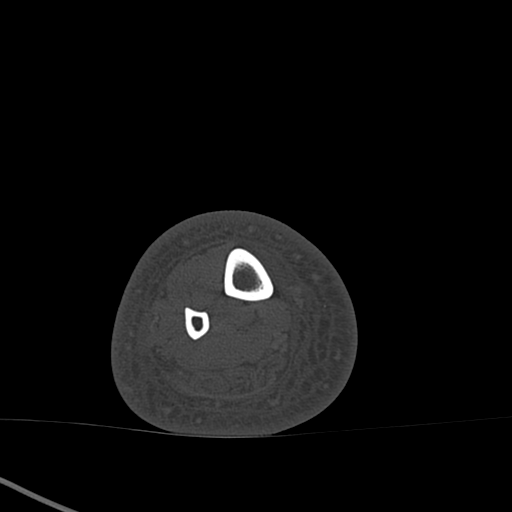
[im 165/268  bone]
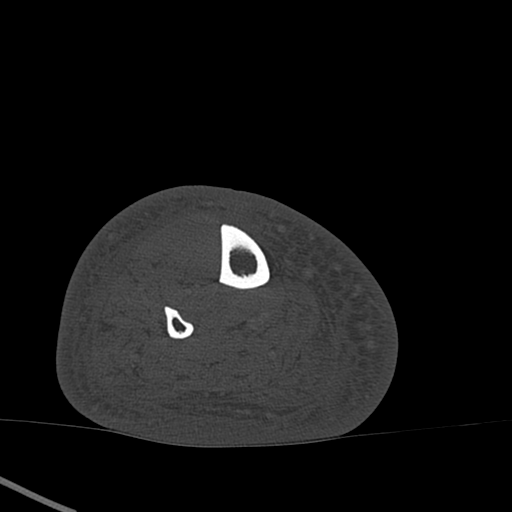
[im 206/268  soft-tissue]
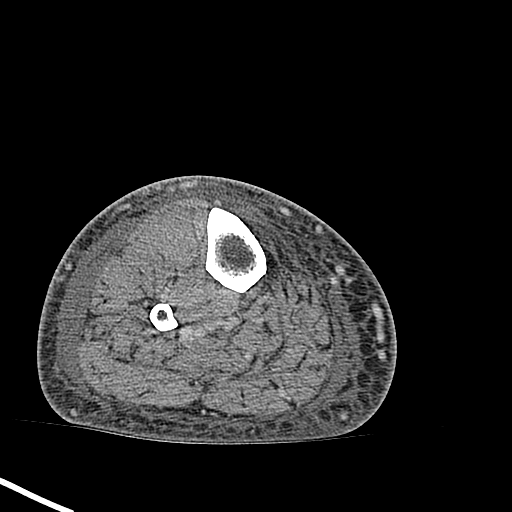
[im 206/268  bone]
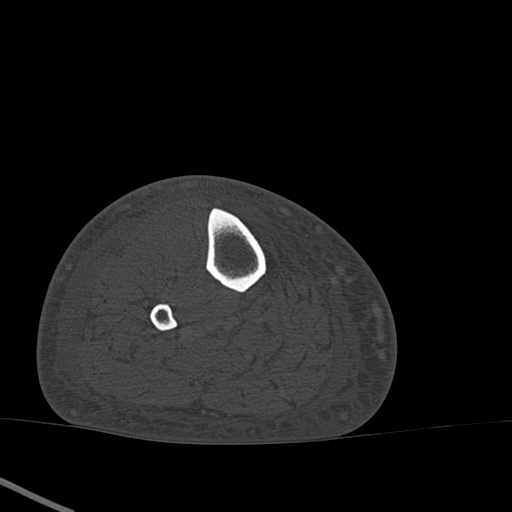
[im 247/268  bone]
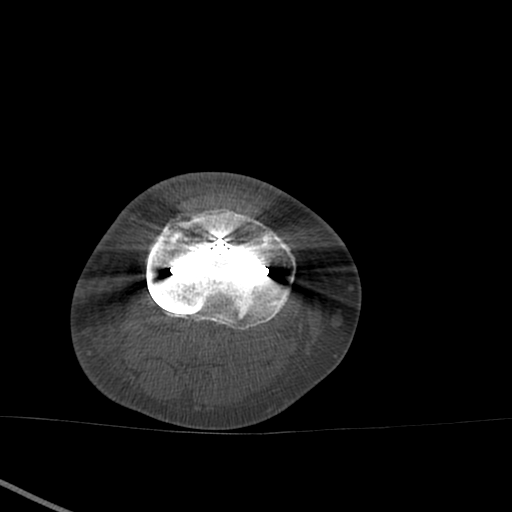

[Series 6: coro bone · coronal · 0.49mm/px · 1 of 122 slices shown]
[im 61/122  bone]
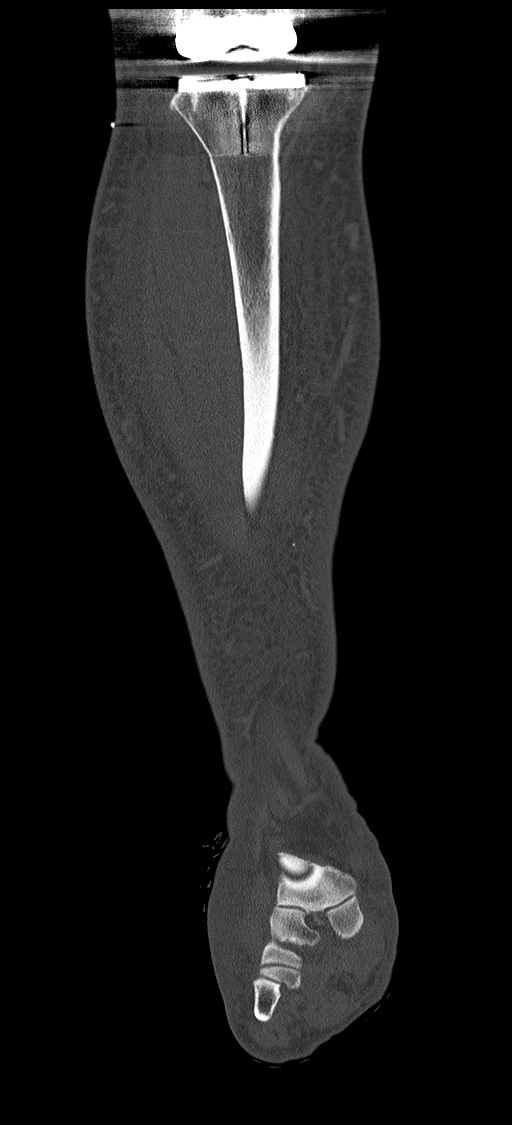

[Series 9: sag st · sagittal · 0.58mm/px · 5 of 131 slices shown]
[im 22/131  bone]
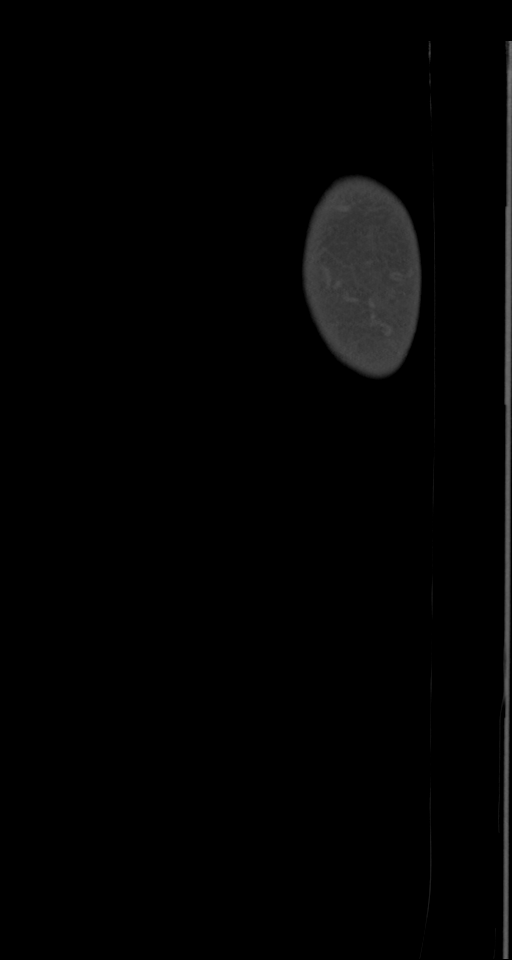
[im 44/131  bone]
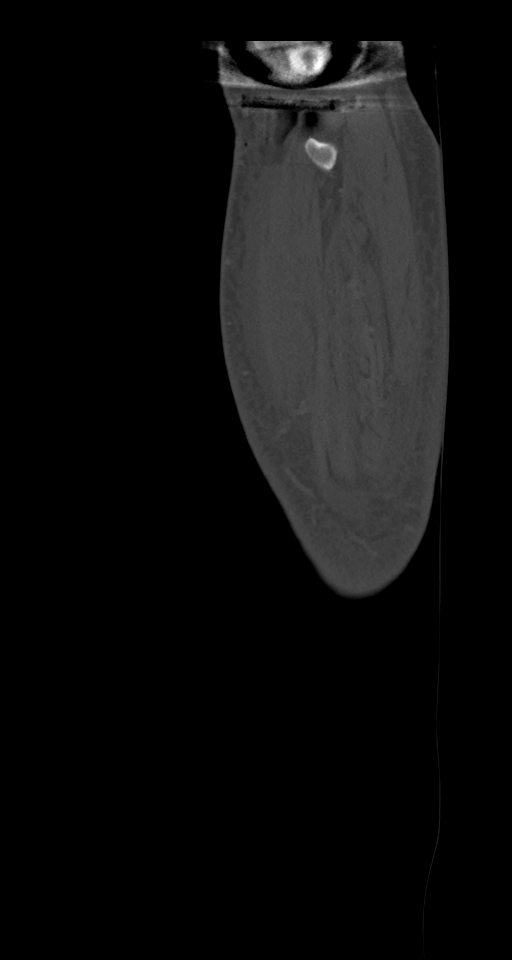
[im 66/131  bone]
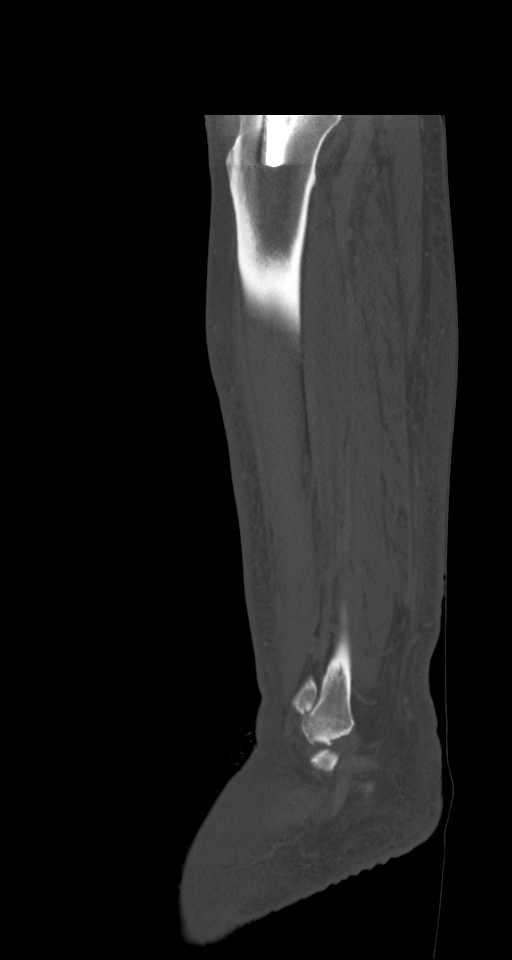
[im 87/131  bone]
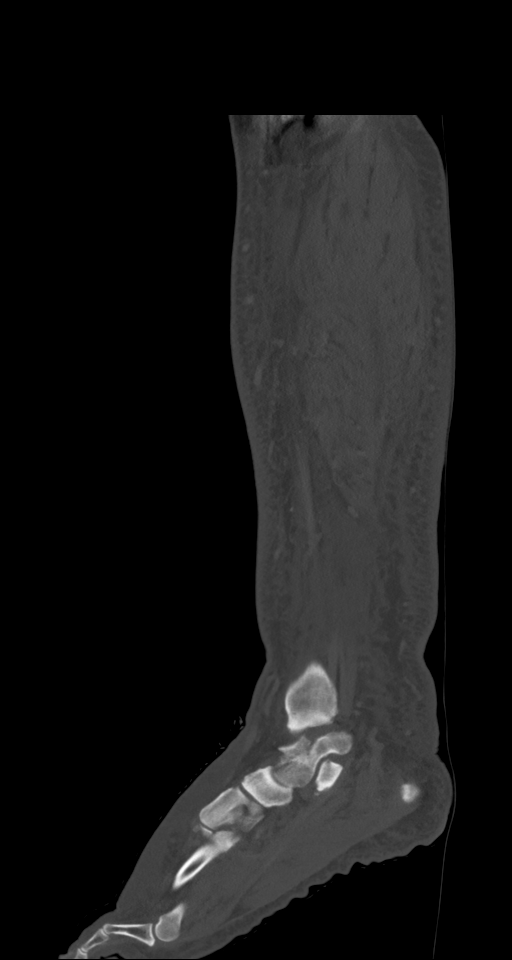
[im 109/131  bone]
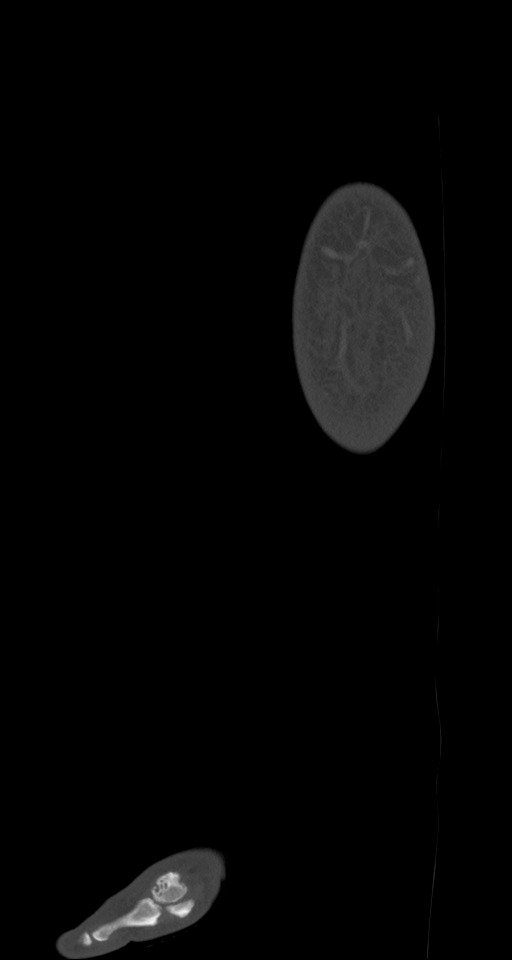

[12 of 33 positions shown; findings below may reference images not displayed]

FINDINGS: Bones/Joint/Cartilage

No significant bone abnormality. Right total knee prosthesis. No
ankle effusion.

Muscles and Tendons

The muscles and tendons of the right lower leg appear normal.

Soft tissues

There circumferential subcutaneous edema throughout the lower leg
extending onto the dorsum of the foot. There is no discrete abscess.
No appreciable myositis.
IMPRESSION: 1. Diffuse subcutaneous edema throughout the right lower leg
extending onto the dorsum of the foot consistent with cellulitis.
2. No discrete abscess.
3. No evidence of myositis or osteomyelitis.

## 2020-10-17 IMAGING — DX DG TIBIA/FIBULA 2V*R*
4 series · 4 of 4 positions shown · non-contrast
Comparison: March 26, 2010.

CLINICAL DATA: Right lower leg infection and swelling.

EXAM:
RIGHT TIBIA AND FIBULA - 2 VIEW

[tibia ap (1 of 2)]
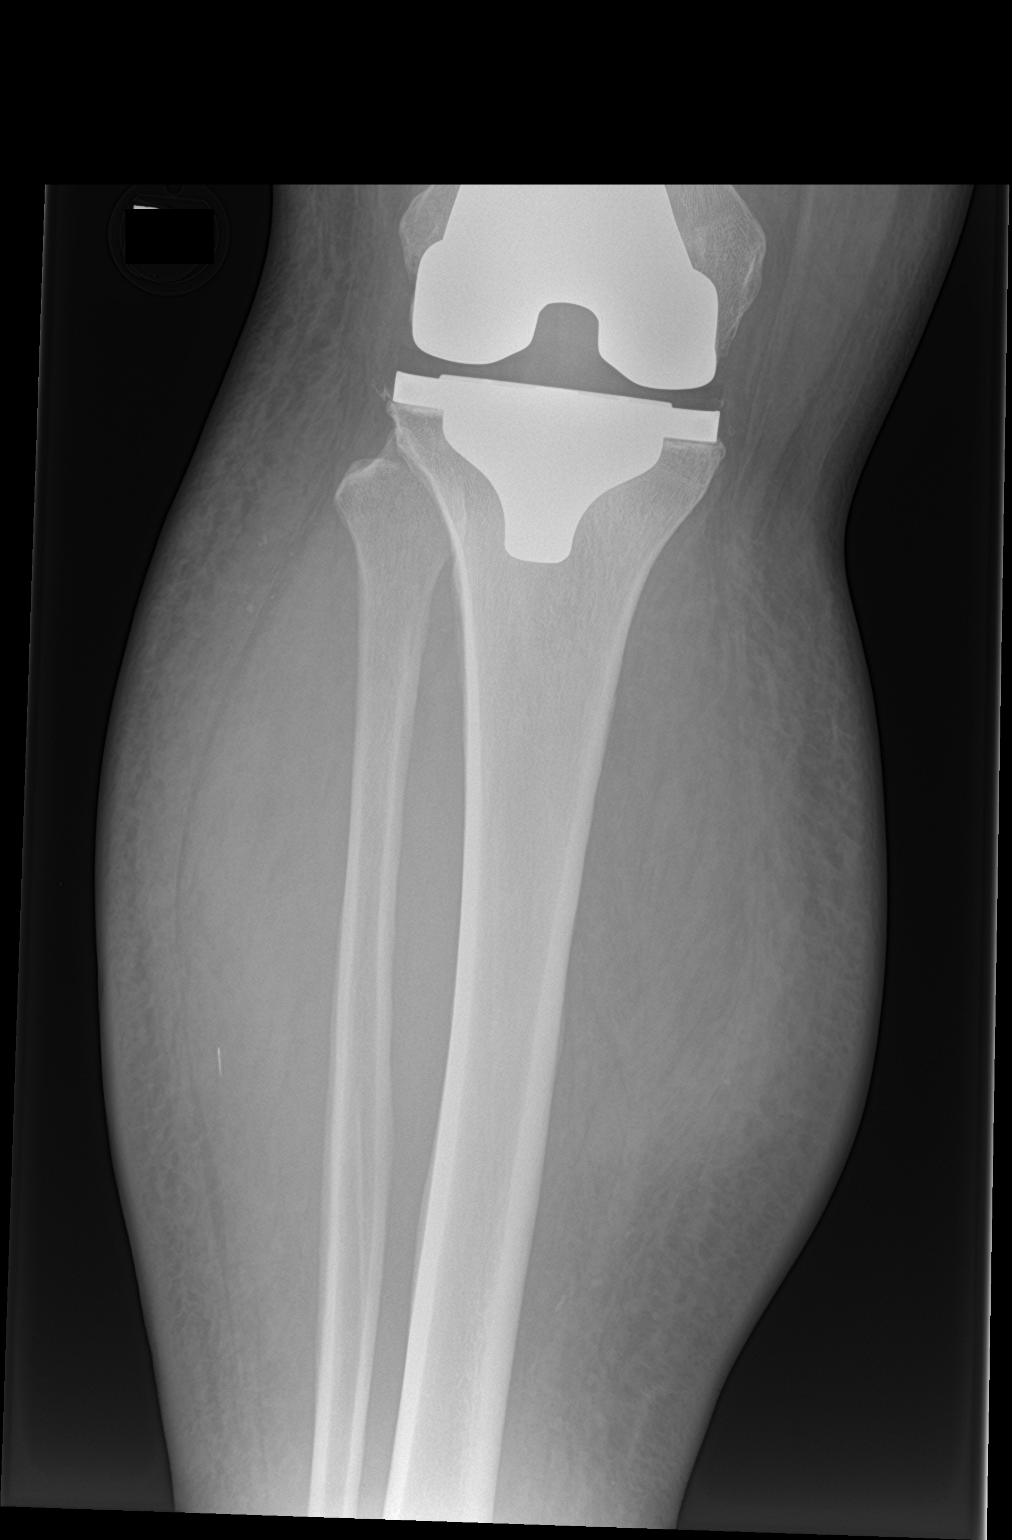

[tibia ap (2 of 2)]
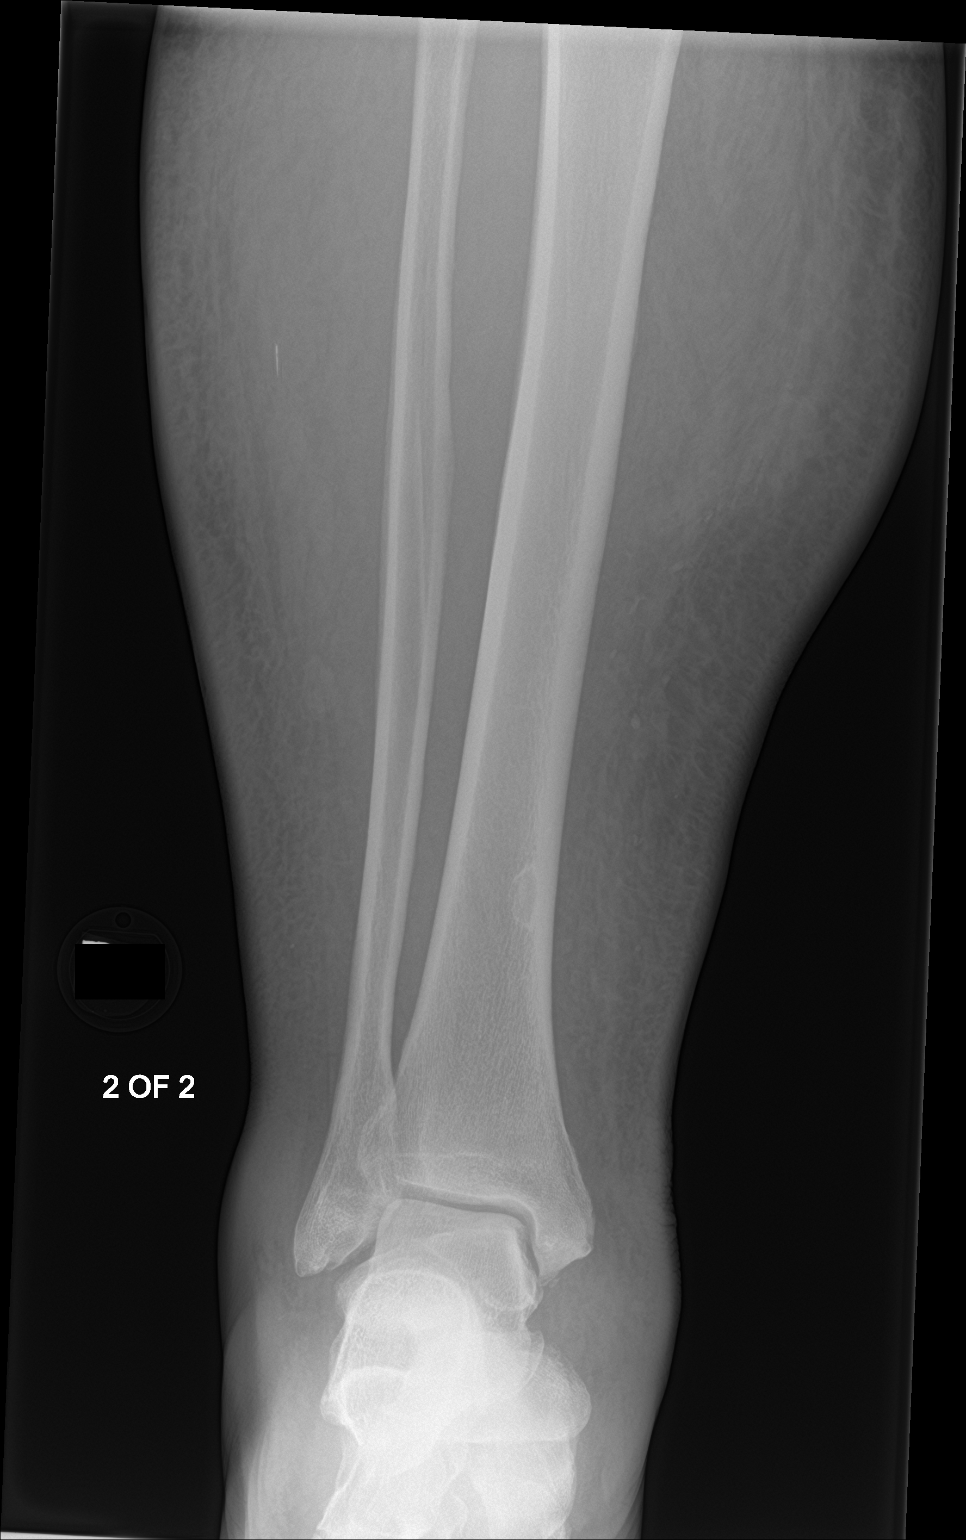

[tibia lat (1 of 2)]
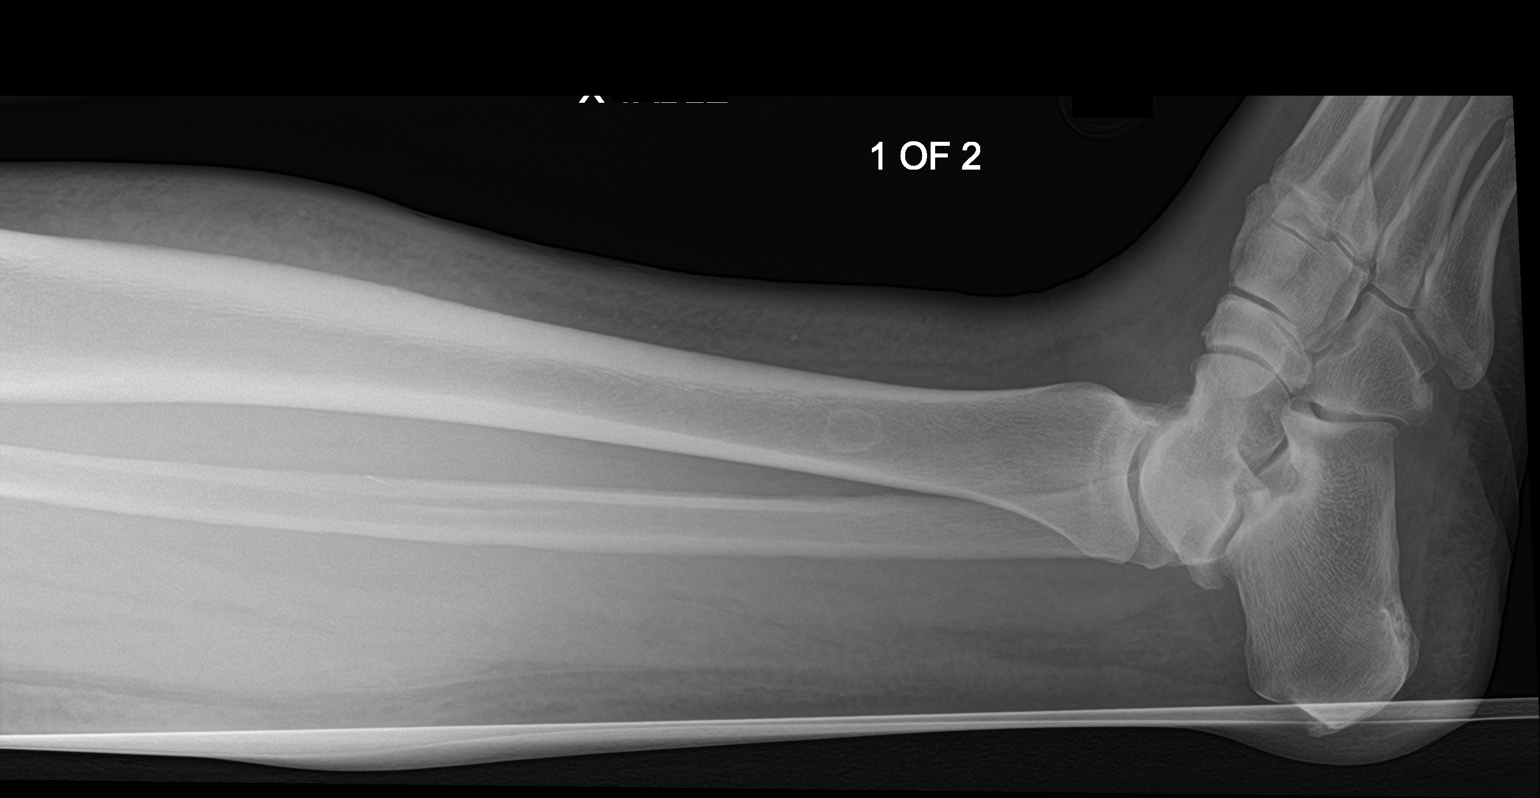

[tibia lat (2 of 2)]
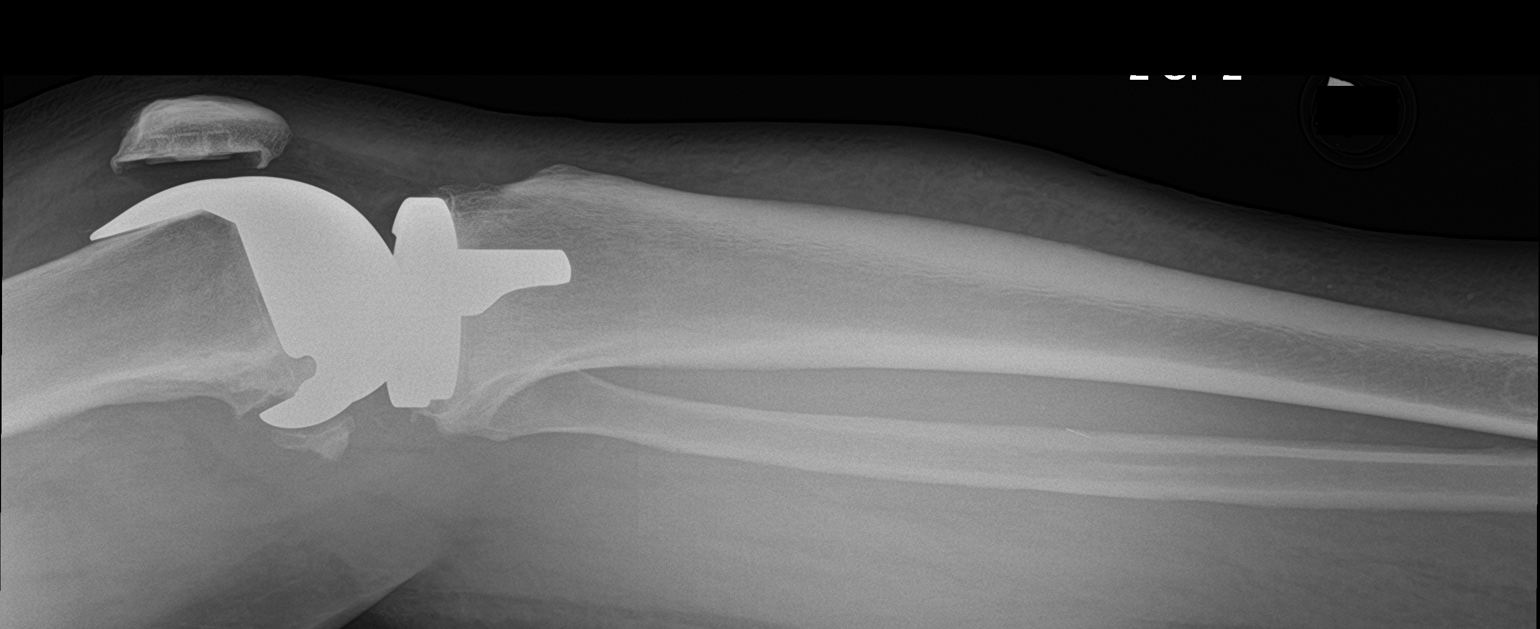

[4 of 4 positions shown; findings below may reference images not displayed]

FINDINGS: Status post right total knee arthroplasty. No fracture or
dislocation is noted. No lytic destruction is seen to suggest
osteomyelitis. Small linear metallic density is seen in soft tissues
lateral to the midportion of the fibula consistent with retained
foreign body. Diffuse soft tissue swelling and edema is noted
concerning for possible cellulitis.
IMPRESSION: Diffuse soft tissue swelling and edema is noted concerning for
cellulitis. Small linear metallic density is seen in soft tissues
lateral to the midportion of the fibula consistent with retained
foreign body.

## 2020-10-23 ENCOUNTER — Other Ambulatory Visit: Payer: Self-pay

## 2020-10-23 ENCOUNTER — Encounter: Payer: Self-pay | Admitting: Vascular Surgery

## 2020-10-23 ENCOUNTER — Ambulatory Visit: Payer: Medicare Other | Admitting: Vascular Surgery

## 2020-10-23 ENCOUNTER — Ambulatory Visit (HOSPITAL_COMMUNITY)
Admission: RE | Admit: 2020-10-23 | Discharge: 2020-10-23 | Disposition: A | Discharge status: Home / Self Care | Payer: Medicare Other | Source: Ambulatory Visit | Attending: Vascular Surgery | Admitting: Vascular Surgery

## 2020-10-23 VITALS — BP 153/94 | HR 68 | Temp 98.2°F | Resp 20 | Ht 67.5 in | Wt 294.0 lb

## 2020-10-23 DIAGNOSIS — I872 Venous insufficiency (chronic) (peripheral): Secondary | ICD-10-CM | POA: Diagnosis not present

## 2020-10-23 DIAGNOSIS — I83811 Varicose veins of right lower extremities with pain: Secondary | ICD-10-CM | POA: Insufficient documentation

## 2020-10-23 NOTE — Progress Notes (Signed)
Patient name: Christopher Lambert MRN: [id number] DOB: 02-23-1956 Sex: male  REASON FOR VISIT: Follow-up after laser ablation right great saphenous vein  HPI: Christopher Lambert is a 65 y.o. male who presented with CEAP C4a venous disease.  He was having significant symptoms despite conservative treatment and was felt to be a good candidate for laser ablation of the right great saphenous vein.  On 10/10/2020 he underwent laser ablation of the right great saphenous vein from 2.5 cm distal to the saphenofemoral junction to the proximal calf.  42 cm of length were treated.  Comes in for follow-up visit.  He has no specific complaints.  He has been wearing his thigh-high compression stockings and elevating his legs.  He had minimal bruising.  Current Outpatient Medications  Medication Sig Dispense Refill  . allopurinol (ZYLOPRIM) 300 MG tablet TAKE 1 TABLET BY MOUTH EVERY DAY 90 tablet 1  . aspirin 81 MG EC tablet TAKE 1 TABLET EVERY DAY (Patient taking differently: Take 81 mg by mouth daily.) 150 tablet 3  . cholecalciferol (VITAMIN D) 1000 UNITS tablet Take 1,000 Units by mouth daily.    Marland Kitchen ELDERBERRY PO Take 1 tablet by mouth daily.     . fluticasone (FLONASE) 50 MCG/ACT nasal spray PLACE 2 SPRAYS INTO BOTH NOSTRILS 2 (TWO) TIMES DAILY. 48 mL 1  . furosemide (LASIX) 20 MG tablet Take 1 tablet (20 mg total) by mouth daily. 90 tablet 2  . metFORMIN (GLUCOPHAGE) 500 MG tablet Take 1 tablet (500 mg total) by mouth daily with breakfast. 90 tablet 1  . Multiple Vitamin (MULTIVITAMIN WITH MINERALS) TABS tablet Take 1 tablet by mouth daily.    Marland Kitchen NIFEdipine (PROCARDIA XL/NIFEDICAL-XL) 90 MG 24 hr tablet TAKE 1 TABLET BY MOUTH EVERY DAY 90 tablet 1  . potassium chloride (KLOR-CON) 10 MEQ tablet TAKE 2 TABLETS BY MOUTH EVERY DAY 180 tablet 0  . simvastatin (ZOCOR) 5 MG tablet TAKE 1 TABLET BY MOUTH EVERYDAY AT BEDTIME 90 tablet 2   No current facility-administered medications for this visit.   REVIEW OF SYSTEMS:  Valu.Nieves ] denotes positive finding; [  ] denotes negative finding  CARDIOVASCULAR:  [ ]  chest pain   [ ]  dyspnea on exertion  [ ]  leg swelling  CONSTITUTIONAL:  [ ]  fever   [ ]  chills  PHYSICAL EXAM: Vitals:   10/23/20 1512  BP: (!) 153/94  Pulse: 68  Resp: 20  Temp: 98.2 F (36.8 C)  SpO2: 95%  Weight: 294 lb (133.4 kg)  Height: 5' 7.5" (1.715 m)   GENERAL: The patient is a well-nourished male, in no acute distress. The vital signs are documented above. CARDIOVASCULAR: There is a regular rate and rhythm. PULMONARY: There is good air exchange bilaterally without wheezing or rales. VASCULAR: He has no significant bruising in the medial right leg.  He has mild bilateral lower extremity swelling.  DATA:  VENOUS DUPLEX: I have independently interpreted his venous duplex scan today.  There is no evidence of DVT in the right lower extremity.  The right great saphenous vein is successfully closed from 1.8 cm distal to the saphenofemoral junction to the proximal calf.  MEDICAL ISSUES:  S/P ENDOVENOUS LASER ABLATION RIGHT GREAT SAPHENOUS VEIN: Patient is doing well status post laser ablation of the right great saphenous vein.  This point I think he can transition to a knee-high compression stocking when he is on his feet.  I encouraged him to exercise and elevate his legs daily.  I will  see him back as needed.  Deitra Mayo [facility] [place] [phone]

## 2020-10-24 ENCOUNTER — Encounter (HOSPITAL_COMMUNITY): Payer: Medicare Other

## 2020-10-24 ENCOUNTER — Ambulatory Visit: Payer: Medicare Other | Admitting: Vascular Surgery

## 2020-11-05 ENCOUNTER — Encounter: Payer: Self-pay | Admitting: Vascular Surgery

## 2020-11-19 ENCOUNTER — Other Ambulatory Visit: Payer: Self-pay | Admitting: Family Medicine

## 2020-11-25 NOTE — Progress Notes (Signed)
Patient ID: Christopher Lambert, male    DOB: Sep 22, 1955, 65 y.o.   MRN: [id number]  HPI male never smoker followed for OSA, rhinitis, complicated by HBP, DM 2, morbid obesity NPSG 01/31/15- severe OSA AHI 49.4/ hr, CPAP suggested 13, AHI 0.0, weight 294 lb  -------------------------------------------------------------------------------   11/27/19- 65 year old male never smoker followed for OSA, rhinitis, complicated by HBP, DM 2, morbid obesity CPAP 16/Advanced Download compliance 90%, AHI 4.5 Body weight today 289 lbs Hosp 3/2-11/16/19- cellulitis R leg, venous insufficiency, no DVT Has had flu vax, no covax yet  11/27/19- 65 year old male never smoker followed for OSA, rhinitis, complicated by HBP, DM 2, morbid obesity, Peripheral Venous Insufficiency,  CPAP 16/Adapt Download-compliance 100%, AHI 14.4 Body weight today-296 lbs Covid vax-3 Moderna  Flu vax- had -----Patient is feeling good, sleeping good, machine working good Wife treated for Myeloma at [facility]- they are coping. She tells him of breakthrough snoring occ. Still feels well rested compared with prior to CPAP. Probably a year or so before eligible to replace machine- will change then to autopap. Reports leg edema and cellulitis doing better since laser procedure.  ROS-see HPI + = positive Constitutional:   No-   weight loss, night sweats, fevers, chills, fatigue, lassitude. HEENT:   No-  headaches, difficulty swallowing, tooth/dental problems, sore throat,       No-  sneezing, itching, ear ache, nasal congestion, post nasal drip,  CV:  No-   chest pain, orthopnea, PND, +swelling in lower extremities, anasarca, dizziness, palpitations Resp: No-   shortness of breath with exertion or at rest.              No-   productive cough,  No non-productive cough,  No- coughing up of blood.              No-   change in color of mucus.  No- wheezing.   Skin: No-   rash or lesions. GI:  No-   heartburn, indigestion, abdominal pain,  nausea, vomiting, GU:  MS:  No-   joint pain or swelling.   Neuro-     nothing unusual Psych:  No- change in mood or affect. No depression or anxiety.  No memory loss.  Objective:   Physical Exam General- Alert, Oriented, Affect-appropriate, Distress- none acute, +morbidly obese Skin- rash-none, lesions- none, excoriation- none Lymphadenopathy- none Head- atraumatic            Eyes- Gross vision intact, PERRLA, conjunctivae clear secretions            Ears- Hearing, canals-normal            Nose- Clear, no-Septal dev, mucus, polyps, erosion, perforation             Throat- Mallampati IV , mucosa clear, drainage- none, tonsils- atrophic Neck- flexible , trachea midline, no stridor , thyroid nl, carotid no bruit Chest - symmetrical excursion , unlabored           Heart/CV- RRR , no murmur , no gallop  , no rub, nl s1 s2                           - JVD- none , elastic hose+           Lung- clear to P&A, wheeze- none, cough- none , dullness-none, rub- none           Chest wall-  Abd-  Br/ Gen/ Rectal- Not done, not  indicated Extrem- +elastic hose R leg Neuro- grossly intact to observation

## 2020-11-26 ENCOUNTER — Other Ambulatory Visit: Payer: Self-pay

## 2020-11-26 ENCOUNTER — Encounter: Payer: Self-pay | Admitting: Internal Medicine

## 2020-11-26 ENCOUNTER — Ambulatory Visit: Payer: Medicare Other | Admitting: Internal Medicine

## 2020-11-26 VITALS — BP 130/70 | HR 74 | Temp 98.4°F | Ht 67.5 in | Wt 296.0 lb

## 2020-11-26 DIAGNOSIS — G4733 Obstructive sleep apnea (adult) (pediatric): Secondary | ICD-10-CM

## 2020-11-26 NOTE — Assessment & Plan Note (Signed)
Weight up 8 lbs since last year. Dealing with wife's illness. Consider [facility] or similar, per his PCP

## 2020-11-26 NOTE — Assessment & Plan Note (Signed)
Has gained some weight, otherwise unclear why residual AHI has jumped. Benefits with improved sleep. Plan- increase to 18 now. When machine replaced in a year or two, change to auto 10-20

## 2020-11-26 NOTE — Patient Instructions (Signed)
Order- DME [facility]- please increase CPAP to 18, continue mask of choice, humidifier, supplies, AirView/card  Please call if we can help

## 2020-12-22 ENCOUNTER — Other Ambulatory Visit: Payer: Self-pay | Admitting: Family Medicine

## 2021-01-07 ENCOUNTER — Other Ambulatory Visit: Payer: Self-pay | Admitting: Family Medicine

## 2021-02-16 ENCOUNTER — Other Ambulatory Visit: Payer: Self-pay | Admitting: Family Medicine

## 2021-02-17 ENCOUNTER — Ambulatory Visit: Payer: Medicare Other | Admitting: Family Medicine

## 2021-02-18 ENCOUNTER — Ambulatory Visit: Payer: Medicare Other | Admitting: Family Medicine

## 2021-02-24 LAB — HM DIABETES EYE EXAM

## 2021-02-27 ENCOUNTER — Other Ambulatory Visit: Payer: Self-pay | Admitting: Family Medicine

## 2021-03-19 LAB — HEMOGLOBIN A1C
Est. average glucose Bld gHb Est-mCnc: 148 mg/dL
Hgb A1c MFr Bld: 6.8 % — ABNORMAL HIGH (ref 4.8–5.6)

## 2021-03-19 LAB — LIPID PANEL
Chol/HDL Ratio: 3.6 ratio (ref 0.0–5.0)
Cholesterol, Total: 159 mg/dL (ref 100–199)
HDL: 44 mg/dL (ref >39)
LDL Chol Calc (NIH): 97 mg/dL (ref 0–99)
Triglycerides: 100 mg/dL (ref 0–149)
VLDL Cholesterol Cal: 18 mg/dL (ref 5–40)

## 2021-03-19 LAB — CMP14+EGFR
ALT: 31 IU/L (ref 0–44)
AST: 20 IU/L (ref 0–40)
Albumin/Globulin Ratio: 1.7 (ref 1.2–2.2)
Albumin: 4.3 g/dL (ref 3.8–4.8)
Alkaline Phosphatase: 47 IU/L (ref 44–121)
BUN/Creatinine Ratio: 11 (ref 10–24)
BUN: 10 mg/dL (ref 8–27)
Bilirubin Total: 0.7 mg/dL (ref 0.0–1.2)
CO2: 24 mmol/L (ref 20–29)
Calcium: 8.7 mg/dL (ref 8.6–10.2)
Chloride: 101 mmol/L (ref 96–106)
Creatinine, Ser: 0.93 mg/dL (ref 0.76–1.27)
Globulin, Total: 2.6 g/dL (ref 1.5–4.5)
Glucose: 132 mg/dL — ABNORMAL HIGH (ref 65–99)
Potassium: 3.5 mmol/L (ref 3.5–5.2)
Sodium: 140 mmol/L (ref 134–144)
Total Protein: 6.9 g/dL (ref 6.0–8.5)
eGFR: 92 mL/min/{1.73_m2} (ref >59)

## 2021-03-20 ENCOUNTER — Other Ambulatory Visit: Payer: Self-pay

## 2021-03-20 ENCOUNTER — Encounter: Payer: Self-pay | Admitting: Family Medicine

## 2021-03-20 ENCOUNTER — Ambulatory Visit: Payer: Medicare Other | Admitting: Family Medicine

## 2021-03-20 VITALS — BP 115/65 | HR 88 | Temp 98.4°F | Resp 20 | Ht 67.5 in | Wt 298.0 lb

## 2021-03-20 DIAGNOSIS — R7303 Prediabetes: Secondary | ICD-10-CM | POA: Diagnosis not present

## 2021-03-20 DIAGNOSIS — I10 Essential (primary) hypertension: Secondary | ICD-10-CM

## 2021-03-20 DIAGNOSIS — E785 Hyperlipidemia, unspecified: Secondary | ICD-10-CM

## 2021-03-20 DIAGNOSIS — J301 Allergic rhinitis due to pollen: Secondary | ICD-10-CM

## 2021-03-20 DIAGNOSIS — G4733 Obstructive sleep apnea (adult) (pediatric): Secondary | ICD-10-CM

## 2021-03-20 NOTE — Assessment & Plan Note (Signed)
Increased symptoms currently, will take sudafed one daily as needed, and continue flonase

## 2021-03-20 NOTE — Assessment & Plan Note (Signed)
Remains compliant with treatment, and followed by Pulmonary

## 2021-03-20 NOTE — Assessment & Plan Note (Signed)
Hyperlipidemia:Low fat diet discussed and encouraged.   Lipid Panel  Lab Results  Component Value Date   CHOL 159 03/18/2021   HDL 44 03/18/2021   LDLCALC 97 03/18/2021   TRIG 100 03/18/2021   CHOLHDL 3.6 03/18/2021     Controlled, no change in medication

## 2021-03-20 NOTE — Progress Notes (Signed)
Christopher Lambert     MRN: [id number]      DOB: December 19, 1955   HPI Christopher Lambert is here for follow up and re-evaluation of chronic medical conditions, medication management and review of any available recent lab and radiology data.  Preventive health is updated, specifically  Cancer screening and Immunization.   Questions or concerns regarding consultations or procedures which the PT has had in the interim are  addressed. The PT denies any adverse reactions to current medications since the last visit.  There are no new concerns.  There are no specific complaints   ROS Denies recent fever or chills. Denies sinus pressure, increased nasal congestion, ear pain or sore throat. Denies chest congestion, productive cough or wheezing. Denies chest pains, palpitations and leg swelling Denies abdominal pain, nausea, vomiting,diarrhea or constipation.   Denies dysuria, frequency, hesitancy or incontinence. Denies joint pain, swelling and limitation in mobility. Denies headaches, seizures, numbness, or tingling. Denies depression, anxiety or insomnia. Denies skin break down or rash.   PE  BP 115/65 (BP Location: Right Arm, Patient Position: Sitting, Cuff Size: Large)   Pulse 88   Temp 98.4 F (36.9 C)   Resp 20   Ht 5' 7.5" (1.715 m)   Wt 298 lb (135.2 kg)   SpO2 95%   BMI 45.98 kg/m   Patient alert and oriented and in no cardiopulmonary distress.  HEENT: No facial asymmetry, EOMI,     Neck supple .  Chest: Clear to auscultation bilaterally.  CVS: S1, S2 no murmurs, no S3.Regular rate.  ABD: Soft non tender.   Ext: No edema  MS: Adequate ROM spine, shoulders, hips and knees.  Skin: Intact, no ulcerations or rash noted.  Psych: Good eye contact, normal affect. Memory intact not anxious or depressed appearing.  CNS: CN 2-12 intact, power,  normal throughout.no focal deficits noted.   Assessment & Plan  Essential hypertension, benign Controlled, no change in medication DASH  diet and commitment to daily physical activity for a minimum of 30 minutes discussed and encouraged, as a part of hypertension management. The importance of attaining a healthy weight is also discussed.  BP/Weight 03/20/2021 11/26/2020 10/23/2020 10/10/2020 09/05/2020 08/20/2020 2/83/6629  Systolic BP 476 546 503 546 568 127 517  Diastolic BP 65 70 94 88 85 84 84  Wt. (Lbs) 298 296 294 292 283.1 284 286  BMI 45.98 45.68 45.37 45.06 42.54 43.18 44.13       Dyslipidemia, goal LDL below 100 Hyperlipidemia:Low fat diet discussed and encouraged.   Lipid Panel  Lab Results  Component Value Date   CHOL 159 03/18/2021   HDL 44 03/18/2021   LDLCALC 97 03/18/2021   TRIG 100 03/18/2021   CHOLHDL 3.6 03/18/2021     Controlled, no change in medication   Morbid obesity ([place])  Patient re-educated about  the importance of commitment to a  minimum of 150 minutes of exercise per week as able.  The importance of healthy food choices with portion control discussed, as well as eating regularly and within a 12 hour window most days. The need to choose "clean , green" food 50 to 75% of the time is discussed, as well as to make water the primary drink and set a goal of 64 ounces water daily.    Weight /BMI 03/20/2021 11/26/2020 10/23/2020  WEIGHT 298 lb 296 lb 294 lb  HEIGHT 5' 7.5" 5' 7.5" 5' 7.5"  BMI 45.98 kg/m2 45.68 kg/m2 45.37 kg/m2  Obstructive sleep apnea Remains compliant with treatment, and followed by Pulmonary  Prediabetes Deteriorated, needs to reduce carb intake .  Seasonal allergic rhinitis Increased symptoms currently, will take sudafed one daily as needed, and continue flonase

## 2021-03-20 NOTE — Assessment & Plan Note (Signed)
Controlled, no change in medication DASH diet and commitment to daily physical activity for a minimum of 30 minutes discussed and encouraged, as a part of hypertension management. The importance of attaining a healthy weight is also discussed.  BP/Weight 03/20/2021 11/26/2020 10/23/2020 10/10/2020 09/05/2020 08/20/2020 3/32/9518  Systolic BP 841 660 630 160 109 323 557  Diastolic BP 65 70 94 88 85 84 84  Wt. (Lbs) 298 296 294 292 283.1 284 286  BMI 45.98 45.68 45.37 45.06 42.54 43.18 44.13

## 2021-03-20 NOTE — Assessment & Plan Note (Signed)
  Patient re-educated about  the importance of commitment to a  minimum of 150 minutes of exercise per week as able.  The importance of healthy food choices with portion control discussed, as well as eating regularly and within a 12 hour window most days. The need to choose "clean , green" food 50 to 75% of the time is discussed, as well as to make water the primary drink and set a goal of 64 ounces water daily.    Weight /BMI 03/20/2021 11/26/2020 10/23/2020  WEIGHT 298 lb 296 lb 294 lb  HEIGHT 5' 7.5" 5' 7.5" 5' 7.5"  BMI 45.98 kg/m2 45.68 kg/m2 45.37 kg/m2

## 2021-03-20 NOTE — Patient Instructions (Addendum)
Annual exam in early December , call if you need me before Please get covid booster #2 as soon as possible, past due  Check and get the shingrix vaccines  Ok to take sudafed one daily , as needed, for sinus drainage  Microalb today  Nurse please add uric acid level , vit D and PSA to recent lab  HBA1C chem 7 and EGFR, CBC, TSH  5 to 7 days before Dec visit  It is important that you exercise regularly at least 30 minutes 5 times a week. If you develop chest pain, have severe difficulty breathing, or feel very tired, stop exercising immediately and seek medical attention   Need to reduce cakes and sweets, your blood sugar  has increased  Thanks for choosing [facility], we consider it a privelige to serve you.  

## 2021-03-20 NOTE — Assessment & Plan Note (Signed)
Deteriorated, needs to reduce carb intake .

## 2021-03-22 LAB — MICROALBUMIN, URINE: Microalbumin, Urine: 3.8 ug/mL

## 2021-03-25 LAB — URIC ACID: Uric Acid: 4 mg/dL (ref 3.8–8.4)

## 2021-03-25 LAB — PSA: Prostate Specific Ag, Serum: 4 ng/mL (ref 0.0–4.0)

## 2021-03-25 LAB — VITAMIN D 25 HYDROXY (VIT D DEFICIENCY, FRACTURES): Vit D, 25-Hydroxy: 46.7 ng/mL (ref 30.0–100.0)

## 2021-03-25 LAB — SPECIMEN STATUS REPORT

## 2021-05-21 ENCOUNTER — Other Ambulatory Visit: Payer: Self-pay | Admitting: Family Medicine

## 2021-05-22 ENCOUNTER — Other Ambulatory Visit: Payer: Self-pay | Admitting: Family Medicine

## 2021-06-11 ENCOUNTER — Other Ambulatory Visit: Payer: Self-pay | Admitting: Family Medicine

## 2021-06-18 ENCOUNTER — Other Ambulatory Visit: Payer: Self-pay | Admitting: Family Medicine

## 2021-06-18 ENCOUNTER — Ambulatory Visit (INDEPENDENT_AMBULATORY_CARE_PROVIDER_SITE_OTHER): Payer: Medicare Other

## 2021-06-18 ENCOUNTER — Other Ambulatory Visit: Payer: Self-pay

## 2021-06-18 VITALS — BP 161/83 | HR 75 | Temp 98.4°F | Ht 68.0 in | Wt 298.0 lb

## 2021-06-18 DIAGNOSIS — Z0000 Encounter for general adult medical examination without abnormal findings: Secondary | ICD-10-CM | POA: Diagnosis not present

## 2021-06-18 DIAGNOSIS — Z23 Encounter for immunization: Secondary | ICD-10-CM | POA: Diagnosis not present

## 2021-06-18 MED ORDER — SIMVASTATIN 5 MG PO TABS: 5.0000 mg | ORAL_TABLET | Freq: Every day | ORAL | 3 refills | Status: DC

## 2021-06-18 NOTE — Progress Notes (Signed)
Subjective:   Christopher Lambert is a 65 y.o. male who presents for Medicare Annual/Subsequent preventive examination.  Review of Systems     Cardiac Risk Factors include: advanced age (>53men, >82 women);hypertension;dyslipidemia;male gender;obesity (BMI >30kg/m2);sedentary lifestyle     Objective:    Today's Vitals   06/18/21 1323  BP: (!) 161/83  Pulse: 75  Temp: 98.4 F (36.9 C)  SpO2: 97%  Weight: 298 lb (135.2 kg)  Height: 5\' 8"  (1.727 m)   Body mass index is 45.31 kg/m.  Advanced Directives 06/18/2021 11/14/2019 11/04/2019 04/16/2019 04/16/2019 03/23/2018 05/20/2012  Does Patient Have a Medical Advance Directive? No No No No No No Patient does not have advance directive  Would patient like information on creating a medical advance directive? No - Patient declined No - Patient declined - Yes (Inpatient - patient requests chaplain consult to create a medical advance directive) - - Advance directive packet given  Pre-existing out of facility DNR order (yellow form or pink MOST form) - - - - - - No    Current Medications (verified) Outpatient Encounter Medications as of 06/18/2021  Medication Sig   allopurinol (ZYLOPRIM) 300 MG tablet TAKE 1 TABLET BY MOUTH EVERY DAY   aspirin 81 MG EC tablet TAKE 1 TABLET EVERY DAY (Patient taking differently: Take 81 mg by mouth daily.)   cholecalciferol (VITAMIN D) 1000 UNITS tablet Take 1,000 Units by mouth daily.   ELDERBERRY PO Take 1 tablet by mouth daily.    fluticasone (FLONASE) 50 MCG/ACT nasal spray PLACE 2 SPRAYS INTO BOTH NOSTRILS 2 (TWO) TIMES DAILY   furosemide (LASIX) 20 MG tablet TAKE 1 TABLET BY MOUTH EVERY DAY   metFORMIN (GLUCOPHAGE) 500 MG tablet TAKE 1 TABLET BY MOUTH EVERY DAY WITH BREAKFAST   Multiple Vitamin (MULTIVITAMIN WITH MINERALS) TABS tablet Take 1 tablet by mouth daily.   NIFEdipine (PROCARDIA XL/NIFEDICAL-XL) 90 MG 24 hr tablet TAKE 1 TABLET BY MOUTH EVERY DAY   potassium chloride (KLOR-CON) 10 MEQ tablet TAKE 2  TABLETS BY MOUTH EVERY DAY   simvastatin (ZOCOR) 5 MG tablet TAKE 1 TABLET BY MOUTH EVERYDAY AT BEDTIME   No facility-administered encounter medications on file as of 06/18/2021.    Allergies (verified) Patient has no known allergies.   History: Past Medical History:  Diagnosis Date   Abnormal cardiovascular function study 09/21/2011   Arthritis of knee    Bilateral   Diabetes mellitus 06/2008   type 2 NIDDM x 7 yrs; off meds   Diabetes mellitus without complication ([place])    Phreesia 08/18/2020   ED (erectile dysfunction)    Essential hypertension, benign 1990   H/O: whooping cough    aschild   Hemorrhoids, internal    Hypertension    Phreesia 16/60/6301   Metabolic syndrome 02/13/931   Obesity    Obstructive sleep apnea 11/09/2007   NPSG 08/26/04 AHI 92/hr, [place],  NPSG 01/31/15- severe OSA AHI 49.4/ hr, CPAP suggested 13, AHI 0.0, weight 294 lbs CPAP Advanced     Palpitations 08/24/2011   Piles (hemorrhoids) 06/19/2014   Seasonal allergies    Sleep apnea 2004   wears CPAP nightly     Past Surgical History:  Procedure Laterality Date   CARDIAC CATHETERIZATION  09/25/2011   CARPAL TUNNEL RELEASE     Right    COLONOSCOPY N/A 03/23/2018   Procedure: COLONOSCOPY;  Surgeon: Daneil Dolin, MD;  [facility];  Service: Endoscopy;  Laterality: N/A;  8:00   ENDOVENOUS ABLATION SAPHENOUS VEIN  W/ LASER Right 10/10/2020   endovenous laser ablation right greater saphenous vein by Gae Gallop MD    HERNIA REPAIR  [id number] approx   umbilical   JOINT REPLACEMENT  2011   right knee   JOINT REPLACEMENT  2013   left knee   left knee replacement   sept 2013   POLYPECTOMY  03/23/2018   Procedure: POLYPECTOMY;  Surgeon: Daneil Dolin, MD;  Location: [facility];  Service: Endoscopy;;  colon   Right knee replacement  06/2010   TOTAL KNEE ARTHROPLASTY  05/18/2012   Procedure: TOTAL KNEE ARTHROPLASTY;  Surgeon: Ninetta Lights, MD;  Location: [place];  Service: Orthopedics;   Laterality: Left;  left total knee arthroplasty   UMBILICAL HERNIA REPAIR     Family History  Problem Relation Age of Onset   Coronary artery disease Brother 68       Premature   Hypertension Brother    Hypertension Father    Cancer Father 78       colon    Hypertension Mother    Diabetes Brother    Hypertension Sister    Hypertension Sister    Hypertension Sister    Hypertension Sister    Social History   Socioeconomic History   Marital status: Married    Spouse name: Civil engineer, contracting   Number of children: 0   Years of education: Not on file   Highest education level: Not on file  Occupational History    Employer: [employer]  Tobacco Use   Smoking status: Former    Packs/day: 0.50    Types: Cigarettes   Smokeless tobacco: Never  Vaping Use   Vaping Use: Never used  Substance and Sexual Activity   Alcohol use: No   Drug use: No   Sexual activity: Yes  Other Topics Concern   Not on file  Social History Narrative   Married x 36 yrs in 2022.   Social Determinants of Health   Financial Resource Strain: Low Risk    Difficulty of Paying Living Expenses: Not hard at all  Food Insecurity: No Food Insecurity   Worried About Charity fundraiser in the Last Year: Never true   [place] in the Last Year: Never true  Transportation Needs: No Transportation Needs   Lack of Transportation (Medical): No   Lack of Transportation (Non-Medical): No  Physical Activity: Sufficiently Active   Days of Exercise per Week: 5 days   Minutes of Exercise per Session: 30 min  Stress: No Stress Concern Present   Feeling of Stress : Not at all  Social Connections: Socially Integrated   Frequency of Communication with Friends and Family: More than three times a week   Frequency of Social Gatherings with Friends and Family: More than three times a week   Attends Religious Services: More than 4 times per year   Active Member of [employer] or Organizations: Yes   Attends Programme researcher, broadcasting/film/video: More than 4 times per year   Marital Status: Married    Tobacco Counseling Counseling given: Not Answered   Clinical Intake:  Pre-visit preparation completed: Yes  Pain : No/denies pain     BMI - recorded: 45.31 Nutritional Status: BMI > 30  Obese Nutritional Risks: None Diabetes: No  How often do you need to have someone help you when you read instructions, pamphlets, or other written materials from your doctor or pharmacy?: 1 - Never  Diabetic?PRE-DIABETES PER PT  Interpreter Needed?: No  Information entered by :: MJ Yasmin Bronaugh, LPN   Activities of Daily Living In your present state of health, do you have any difficulty performing the following activities: 06/18/2021  Hearing? N  Vision? N  Difficulty concentrating or making decisions? N  Walking or climbing stairs? N  Dressing or bathing? N  Doing errands, shopping? N  Preparing Food and eating ? N  Using the Toilet? N  In the past six months, have you accidently leaked urine? N  Do you have problems with loss of bowel control? N  Managing your Medications? N  Managing your Finances? N  Housekeeping or managing your Housekeeping? N  Some recent data might be hidden    Patient Care Team: [name], MD as PCP - General Deneise Lever, MD as Consulting Physician (Pulmonary Disease)  Indicate any recent Medical Services you may have received from other than [facility] providers in the past year (date may be approximate).     Assessment:   This is a routine wellness examination for Jas.  Hearing/Vision screen Hearing Screening - Comments:: No hearing issues.  Vision Screening - Comments:: Glasses. Dr. Gershon Crane. 02/24/2021.  Dietary issues and exercise activities discussed: Current Exercise Habits: Home exercise routine, Type of exercise: walking, Time (Minutes): 20, Frequency (Times/Week): 5, Weekly Exercise (Minutes/Week): 100, Intensity: Mild, Exercise limited by: cardiac  condition(s)   Goals Addressed             This Visit's Progress    DIET - REDUCE CALORIE INTAKE   Not on track    Exercise 3x per week (30 min per time)       Increase exercise.       Depression Screen PHQ 2/9 Scores 06/18/2021 06/18/2021 03/20/2021 06/11/2020 03/11/2020 11/14/2019 11/07/2019  PHQ - 2 Score 0 0 0 0 0 0 0  PHQ- 9 Score - - - - - - -    Fall Risk Fall Risk  06/18/2021 03/20/2021 06/11/2020 03/11/2020 11/29/2019  Falls in the past year? 0 0 0 0 0  Number falls in past yr: 0 0 0 0 0  Injury with Fall? 0 0 0 0 0  Risk for fall due to : Impaired vision No Fall Risks - No Fall Risks -  Follow up Falls prevention discussed Falls evaluation completed - Falls evaluation completed -    FALL RISK PREVENTION PERTAINING TO THE HOME:  Any stairs in or around the home? Yes  If so, are there any without handrails? No  Home free of loose throw rugs in walkways, pet beds, electrical cords, etc? Yes  Adequate lighting in your home to reduce risk of falls? Yes   ASSISTIVE DEVICES UTILIZED TO PREVENT FALLS:  Life alert? No  Use of a cane, walker or w/c? No  Grab bars in the bathroom? Yes  Shower chair or bench in shower? Yes  Elevated toilet seat or a handicapped toilet? Yes   TIMED UP AND GO:  Was the test performed? Yes .  Length of time to ambulate 10 feet: 8 sec.   Gait steady and fast without use of assistive device  Cognitive Function:     6CIT Screen 06/11/2020 05/30/2019  What Year? 0 points 4 points  What month? 0 points 0 points  What time? 0 points 0 points  Count back from 20 0 points 0 points  Months in reverse 0 points 0 points  Repeat phrase 0 points 0 points  Total Score 0 4  Immunizations Immunization History  Administered Date(s) Administered   Influenza Split 08/24/2011, 05/30/2012   Influenza Whole 08/18/2005, 06/26/2010   Influenza,inj,Quad PF,6+ Mos 06/23/2013, 06/19/2014, 07/14/2016, 09/01/2017, 05/09/2018, 05/08/2019, 06/14/2020    Influenza-Unspecified 05/31/2015, 06/18/2021   Moderna Sars-Covid-2 Vaccination 11/30/2019, 12/29/2019, 07/18/2020   Pneumococcal Conjugate-13 10/23/2014   Pneumococcal Polysaccharide-23 02/16/2005, 05/20/2012   Td 02/16/2005   Tdap 02/27/2015    TDAP status: Up to date  Flu Vaccine status: Completed at today's visit  Pneumococcal vaccine status: Up to date  Covid-19 vaccine status: Completed vaccines  Qualifies for Shingles Vaccine? Yes   Zostavax completed Yes   Shingrix Completed?: Yes  Screening Tests Health Maintenance  Topic Date Due   Zoster Vaccines- Shingrix (1 of 2) Never done   COVID-19 Vaccine (4 - Booster for Moderna series) 11/15/2020   HEMOGLOBIN A1C  09/18/2021   OPHTHALMOLOGY EXAM  02/24/2022   URINE MICROALBUMIN  03/20/2022   TETANUS/TDAP  02/26/2025   COLONOSCOPY (Pts 45-13yrs Insurance coverage will need to be confirmed)  03/23/2028   INFLUENZA VACCINE  Completed   Hepatitis C Screening  Completed   HIV Screening  Completed   HPV VACCINES  Aged Out   FOOT EXAM  Discontinued    Health Maintenance  Health Maintenance Due  Topic Date Due   Zoster Vaccines- Shingrix (1 of 2) Never done   COVID-19 Vaccine (4 - Booster for Moderna series) 11/15/2020    Colorectal cancer screening: Type of screening: Colonoscopy. Completed 03/23/2018. Repeat every 10 years  Lung Cancer Screening: (Low Dose CT Chest recommended if Age 51-80 years, 30 pack-year currently smoking OR have quit w/in 15years.) does not qualify.    Additional Screening:  Hepatitis C Screening: does qualify; Completed 07/01/2015   Vision Screening: Recommended annual ophthalmology exams for early detection of glaucoma and other disorders of the eye. Is the patient up to date with their annual eye exam?  Yes  Who is the provider or what is the name of the office in which the patient attends annual eye exams? Dr. Gershon Crane If pt is not established with a provider, would they like to be  referred to a provider to establish care? No .   Dental Screening: Recommended annual dental exams for proper oral hygiene  Community Resource Referral / Chronic Care Management: CRR required this visit?  No   CCM required this visit?  No      Plan:     I have personally reviewed and noted the following in the patient's chart:   Medical and social history Use of alcohol, tobacco or illicit drugs  Current medications and supplements including opioid prescriptions. Patient is not currently taking opioid prescriptions. Functional ability and status Nutritional status Physical activity Advanced directives List of other physicians Hospitalizations, surgeries, and ER visits in previous 12 months Vitals Screenings to include cognitive, depression, and falls Referrals and appointments  In addition, I have reviewed and discussed with patient certain preventive protocols, quality metrics, and best practice recommendations. A written personalized care plan for preventive services as well as general preventive health recommendations were provided to patient.     Chriss Driver, LPN   62/10/2977   Nurse Notes: Doing well. Flu shot given today. Up to date on all health screenings. Copy of advanced directive forms given to patient also.

## 2021-06-18 NOTE — Progress Notes (Signed)
Simvasatin 5

## 2021-06-18 NOTE — Patient Instructions (Signed)
Christopher Lambert , Thank you for taking time to come for your Medicare Wellness Visit. I appreciate your ongoing commitment to your health goals. Please review the following plan we discussed and let me know if I can assist you in the future.   Screening recommendations/referrals: Colonoscopy: Done 03/23/2018 Repeat in 10 years  Recommended yearly ophthalmology/optometry visit for glaucoma screening and checkup Recommended yearly dental visit for hygiene and checkup  Vaccinations: Influenza vaccine: 06/18/2021 Pneumococcal vaccine: Done 05/20/2012 & 10/23/2014 Tdap vaccine: Done 02/27/2015 Repeat in 10 years  Shingles vaccine: Shingrix discussed. Please contact your pharmacy for coverage information.     Covid-19: Done 11/30/19 12/29/19 and 07/18/20  Advanced directives: Advance directive discussed with you today. I have provided a copy for you to complete at home and have notarized. Once this is complete please bring a copy in to our office so we can scan it into your chart.   Conditions/risks identified: Aim for 30 minutes of exercise or brisk walking each day, drink 6-8 glasses of water and eat lots of fruits and vegetables.   Next appointment: Follow up in one year for your annual wellness visit 2023.  Preventive Care 40-64 Years, Male Preventive care refers to lifestyle choices and visits with your health care provider that can promote health and wellness. What does preventive care include? A yearly physical exam. This is also called an annual well check. Dental exams once or twice a year. Routine eye exams. Ask your health care provider how often you should have your eyes checked. Personal lifestyle choices, including: Daily care of your teeth and gums. Regular physical activity. Eating a healthy diet. Avoiding tobacco and drug use. Limiting alcohol use. Practicing safe sex. Taking low-dose aspirin every day starting at age 39. What happens during an annual well check? The services and  screenings done by your health care provider during your annual well check will depend on your age, overall health, lifestyle risk factors, and family history of disease. Counseling  Your health care provider may ask you questions about your: Alcohol use. Tobacco use. Drug use. Emotional well-being. Home and relationship well-being. Sexual activity. Eating habits. Work and work Statistician. Screening  You may have the following tests or measurements: Height, weight, and BMI. Blood pressure. Lipid and cholesterol levels. These may be checked every 5 years, or more frequently if you are over 65 years old. Skin check. Lung cancer screening. You may have this screening every year starting at age 31 if you have a 30-pack-year history of smoking and currently smoke or have quit within the past 15 years. Fecal occult blood test (FOBT) of the stool. You may have this test every year starting at age 62. Flexible sigmoidoscopy or colonoscopy. You may have a sigmoidoscopy every 5 years or a colonoscopy every 10 years starting at age 40. Prostate cancer screening. Recommendations will vary depending on your family history and other risks. Hepatitis C blood test. Hepatitis B blood test. Sexually transmitted disease (STD) testing. Diabetes screening. This is done by checking your blood sugar (glucose) after you have not eaten for a while (fasting). You may have this done every 1-3 years. Discuss your test results, treatment options, and if necessary, the need for more tests with your health care provider. Vaccines  Your health care provider may recommend certain vaccines, such as: Influenza vaccine. This is recommended every year. Tetanus, diphtheria, and acellular pertussis (Tdap, Td) vaccine. You may need a Td booster every 10 years. Zoster vaccine. You may need this  after age 71. Pneumococcal 13-valent conjugate (PCV13) vaccine. You may need this if you have certain conditions and have not been  vaccinated. Pneumococcal polysaccharide (PPSV23) vaccine. You may need one or two doses if you smoke cigarettes or if you have certain conditions. Talk to your health care provider about which screenings and vaccines you need and how often you need them. This information is not intended to replace advice given to you by your health care provider. Make sure you discuss any questions you have with your health care provider. Document Released: 09/27/2015 Document Revised: 05/20/2016 Document Reviewed: 07/02/2015 Elsevier Interactive Patient Education  2017 [employer] Prevention in the Home Falls can cause injuries. They can happen to people of all ages. There are many things you can do to make your home safe and to help prevent falls. What can I do on the outside of my home? Regularly fix the edges of walkways and driveways and fix any cracks. Remove anything that might make you trip as you walk through a door, such as a raised step or threshold. Trim any bushes or trees on the path to your home. Use bright outdoor lighting. Clear any walking paths of anything that might make someone trip, such as rocks or tools. Regularly check to see if handrails are loose or broken. Make sure that both sides of any steps have handrails. Any raised decks and porches should have guardrails on the edges. Have any leaves, snow, or ice cleared regularly. Use sand or salt on walking paths during winter. Clean up any spills in your garage right away. This includes oil or grease spills. What can I do in the bathroom? Use night lights. Install grab bars by the toilet and in the tub and shower. Do not use towel bars as grab bars. Use non-skid mats or decals in the tub or shower. If you need to sit down in the shower, use a plastic, non-slip stool. Keep the floor dry. Clean up any water that spills on the floor as soon as it happens. Remove soap buildup in the tub or shower regularly. Attach bath mats  securely with double-sided non-slip rug tape. Do not have throw rugs and other things on the floor that can make you trip. What can I do in the bedroom? Use night lights. Make sure that you have a light by your bed that is easy to reach. Do not use any sheets or blankets that are too big for your bed. They should not hang down onto the floor. Have a firm chair that has side arms. You can use this for support while you get dressed. Do not have throw rugs and other things on the floor that can make you trip. What can I do in the kitchen? Clean up any spills right away. Avoid walking on wet floors. Keep items that you use a lot in easy-to-reach places. If you need to reach something above you, use a strong step stool that has a grab bar. Keep electrical cords out of the way. Do not use floor polish or wax that makes floors slippery. If you must use wax, use non-skid floor wax. Do not have throw rugs and other things on the floor that can make you trip. What can I do with my stairs? Do not leave any items on the stairs. Make sure that there are handrails on both sides of the stairs and use them. Fix handrails that are broken or loose. Make sure that handrails are as  long as the stairways. Check any carpeting to make sure that it is firmly attached to the stairs. Fix any carpet that is loose or worn. Avoid having throw rugs at the top or bottom of the stairs. If you do have throw rugs, attach them to the floor with carpet tape. Make sure that you have a light switch at the top of the stairs and the bottom of the stairs. If you do not have them, ask someone to add them for you. What else can I do to help prevent falls? Wear shoes that: Do not have high heels. Have rubber bottoms. Are comfortable and fit you well. Are closed at the toe. Do not wear sandals. If you use a stepladder: Make sure that it is fully opened. Do not climb a closed stepladder. Make sure that both sides of the stepladder  are locked into place. Ask someone to hold it for you, if possible. Clearly mark and make sure that you can see: Any grab bars or handrails. First and last steps. Where the edge of each step is. Use tools that help you move around (mobility aids) if they are needed. These include: Canes. Walkers. Scooters. Crutches. Turn on the lights when you go into a dark area. Replace any light bulbs as soon as they burn out. Set up your furniture so you have a clear path. Avoid moving your furniture around. If any of your floors are uneven, fix them. If there are any pets around you, be aware of where they are. Review your medicines with your doctor. Some medicines can make you feel dizzy. This can increase your chance of falling. Ask your doctor what other things that you can do to help prevent falls. This information is not intended to replace advice given to you by your health care provider. Make sure you discuss any questions you have with your health care provider. Document Released: 06/27/2009 Document Revised: 02/06/2016 Document Reviewed: 10/05/2014 Elsevier Interactive Patient Education  2017 [employer].

## 2021-06-26 ENCOUNTER — Other Ambulatory Visit: Payer: Self-pay

## 2021-06-26 MED ORDER — SIMVASTATIN 5 MG PO TABS: 5.0000 mg | ORAL_TABLET | Freq: Every day | ORAL | 3 refills | Status: DC

## 2021-08-20 ENCOUNTER — Encounter: Payer: Medicare Other | Admitting: Family Medicine

## 2021-09-05 ENCOUNTER — Other Ambulatory Visit: Payer: Self-pay | Admitting: Family Medicine

## 2021-09-08 ENCOUNTER — Other Ambulatory Visit: Payer: Self-pay | Admitting: Family Medicine

## 2021-09-09 ENCOUNTER — Other Ambulatory Visit: Payer: Self-pay | Admitting: Family Medicine

## 2021-09-24 LAB — CMP14+EGFR
ALT: 34 IU/L (ref 0–44)
AST: 21 IU/L (ref 0–40)
Albumin/Globulin Ratio: 1.4 (ref 1.2–2.2)
Albumin: 4.2 g/dL (ref 3.8–4.8)
Alkaline Phosphatase: 48 IU/L (ref 44–121)
BUN/Creatinine Ratio: 10 (ref 10–24)
BUN: 10 mg/dL (ref 8–27)
Bilirubin Total: 0.9 mg/dL (ref 0.0–1.2)
CO2: 25 mmol/L (ref 20–29)
Calcium: 9 mg/dL (ref 8.6–10.2)
Chloride: 101 mmol/L (ref 96–106)
Creatinine, Ser: 1.04 mg/dL (ref 0.76–1.27)
Globulin, Total: 2.9 g/dL (ref 1.5–4.5)
Glucose: 127 mg/dL — ABNORMAL HIGH (ref 70–99)
Potassium: 3.8 mmol/L (ref 3.5–5.2)
Sodium: 139 mmol/L (ref 134–144)
Total Protein: 7.1 g/dL (ref 6.0–8.5)
eGFR: 80 mL/min/{1.73_m2} (ref >59)

## 2021-09-24 LAB — CBC WITH DIFFERENTIAL/PLATELET
Basophils Absolute: 0 10*3/uL (ref 0.0–0.2)
Basos: 1 %
EOS (ABSOLUTE): 0.4 10*3/uL (ref 0.0–0.4)
Eos: 9 %
Hematocrit: 42.6 % (ref 37.5–51.0)
Hemoglobin: 15.2 g/dL (ref 13.0–17.7)
Immature Grans (Abs): 0 10*3/uL (ref 0.0–0.1)
Immature Granulocytes: 0 %
Lymphocytes Absolute: 1.8 10*3/uL (ref 0.7–3.1)
Lymphs: 40 %
MCH: 31.5 pg (ref 26.6–33.0)
MCHC: 35.7 g/dL (ref 31.5–35.7)
MCV: 88 fL (ref 79–97)
Monocytes Absolute: 0.3 10*3/uL (ref 0.1–0.9)
Monocytes: 7 %
Neutrophils Absolute: 2 10*3/uL (ref 1.4–7.0)
Neutrophils: 43 %
Platelets: 226 10*3/uL (ref 150–450)
RBC: 4.83 x10E6/uL (ref 4.14–5.80)
RDW: 12.3 % (ref 11.6–15.4)
WBC: 4.6 10*3/uL (ref 3.4–10.8)

## 2021-09-24 LAB — HEMOGLOBIN A1C
Est. average glucose Bld gHb Est-mCnc: 143 mg/dL
Hgb A1c MFr Bld: 6.6 % — ABNORMAL HIGH (ref 4.8–5.6)

## 2021-09-24 LAB — TSH: TSH: 1.95 u[IU]/mL (ref 0.450–4.500)

## 2021-09-25 ENCOUNTER — Encounter: Payer: Self-pay | Admitting: Family Medicine

## 2021-09-25 ENCOUNTER — Ambulatory Visit (INDEPENDENT_AMBULATORY_CARE_PROVIDER_SITE_OTHER): Payer: Medicare Other | Admitting: Family Medicine

## 2021-09-25 ENCOUNTER — Other Ambulatory Visit: Payer: Self-pay

## 2021-09-25 DIAGNOSIS — Z0000 Encounter for general adult medical examination without abnormal findings: Secondary | ICD-10-CM | POA: Diagnosis not present

## 2021-09-25 NOTE — Assessment & Plan Note (Signed)

## 2021-09-25 NOTE — Patient Instructions (Addendum)
F/U in 4 months, re eval weight and diabetes, call if you need me sooner, glyco HB in office   Please get current covid vaccine  Get shingrix vaccines if able    Please work on changing food habits, weight loss goal is at least 2 pounds per month  It is important that you exercise regularly at least 30 minutes 5 times a week. If you develop chest pain, have severe difficulty breathing, or feel very tired, stop exercising immediately and seek medical attention   Think about what you will eat, plan ahead. Choose " clean, green, fresh or frozen" over canned, processed or packaged foods which are more sugary, salty and fatty. 70 to 75% of food eaten should be vegetables and fruit. Three meals at set times with snacks allowed between meals, but they must be fruit or vegetables. Aim to eat over a 12 hour period , example 7 am to 7 pm, and STOP after  your last meal of the day. Drink water,generally about 64 ounces per day, no other drink is as healthy. Fruit juice is best enjoyed in a healthy way, by EATING the fruit.

## 2021-09-29 ENCOUNTER — Encounter: Payer: Self-pay | Admitting: Family Medicine

## 2021-09-29 NOTE — Progress Notes (Signed)
° °  Christopher Lambert     MRN: [id number]      DOB: 12/09/1955   HPI: Patient is in for annual physical exam. No other health concerns are expressed or addressed at the visit. Recent labs, if available are reviewed. Immunization is reviewed , and  updated if needed.    PE; BP 134/80    Pulse 78    Ht 5\' 7"  (1.702 m)    Wt 289 lb 0.6 oz (131.1 kg)    SpO2 95%    BMI 45.27 kg/m   Pleasant male, alert and oriented x 3, in no cardio-pulmonary distress. Afebrile. HEENT No facial trauma or asymetry. Sinuses non tender. EOMI External ears normal,  Neck: supple, no adenopathy,JVD or thyromegaly.No bruits.  Chest: Clear to ascultation bilaterally.No crackles or wheezes. Non tender to palpation  Cardiovascular system; Heart sounds normal,  S1 and  S2 ,no S3.  No murmur, or thrill. Apical beat not displaced Peripheral pulses normal.  Abdomen: Soft, non tender, no organomegaly or masses. No bruits. Bowel sounds normal. No guarding, tenderness or rebound.    Musculoskeletal exam: Decreased though adequate  ROM of spine, hips , shoulders and knees. No deformity ,swelling or crepitus noted. No muscle wasting or atrophy.   Neurologic: Cranial nerves 2 to 12 intact. Power, tone ,sensation and reflexes normal throughout. No disturbance in gait. No tremor.  Skin: Intact, no ulceration, erythema , scaling or rash noted. Pigmentation normal throughout  Psych; Normal mood and affect. Judgement and concentration normal   Assessment & Plan:  Annual physical exam Annual exam as documented. Counseling done  re healthy lifestyle involving commitment to 150 minutes exercise per week, heart healthy diet, and attaining healthy weight.The importance of adequate sleep also discussed. Regular seat belt use and home safety, is also discussed. Changes in health habits are decided on by the patient with goals and time frames  set for achieving them. Immunization and cancer screening needs are  specifically addressed at this visit.

## 2021-11-03 ENCOUNTER — Telehealth: Payer: Self-pay | Admitting: Internal Medicine

## 2021-11-03 NOTE — Telephone Encounter (Signed)
Called and left voicemail for patient to call office back regarding CPAP machine

## 2021-11-19 ENCOUNTER — Other Ambulatory Visit: Payer: Self-pay | Admitting: Family Medicine

## 2021-11-26 ENCOUNTER — Ambulatory Visit: Payer: Medicare Other | Admitting: Internal Medicine

## 2021-12-14 NOTE — Progress Notes (Signed)
? ? Patient ID: Christopher Lambert, male    DOB: [birth year], 66 y.o.   MRN: [id number] ? ?HPI ?male never smoker followed for OSA, rhinitis, complicated by HBP, DM 2, morbid obesity ?NPSG 01/31/15- severe OSA AHI 49.4/ hr, CPAP suggested 13, AHI 0.0, weight 294 lb ? ?------------------------------------------------------------------------------- ? ? ?11/27/19- 66 year old male never smoker followed for OSA, rhinitis, complicated by HBP, DM 2, morbid obesity, Peripheral Venous Insufficiency,  ?CPAP 16/Adapt ?Download-compliance 100%, AHI 14.4 ?Body weight today-296 lbs ?Covid vax-3 Moderna  ?Flu vax- had ?-----Patient is feeling good, sleeping good, machine working good ?Wife treated for Myeloma at [facility]- they are coping. ?She tells him of breakthrough snoring occ. Still feels well rested compared with prior to CPAP. ?Probably a year or so before eligible to replace machine- will change then to autopap. ?Reports leg edema and cellulitis doing better since laser procedure. ? ?12/16/21-  66 year old male never smoker followed for OSA, rhinitis, complicated by HBP, DM 2, morbid obesity, Peripheral Venous Insufficiency,  ?CPAP 16/Adapt ?Download-compliance -forgot card ?Body weight [id number] lbs ?Covid vax-4 Moderna  ?Flu vax-had ?------He is doing well and feels that his mouth is dry after waking up.  ?He would like a DME closer to his home for replacement of old CPAP. Uses every night and sleeps better with CPAP. We discussed change to auto 10-20. ? ? ?ROS-see HPI + = positive ?Constitutional:   No-   weight loss, night sweats, fevers, chills, fatigue, lassitude. ?HEENT:   No-  headaches, difficulty swallowing, tooth/dental problems, sore throat,  ?     No-  sneezing, itching, ear ache, nasal congestion, post nasal drip,  ?CV:  No-   chest pain, orthopnea, PND, +swelling in lower extremities, anasarca, dizziness, palpitations ?Resp: No-   shortness of breath with exertion or at rest.   ?           No-   productive cough,  No  non-productive cough,  No- coughing up of blood.   ?           No-   change in color of mucus.  No- wheezing.   ?Skin: No-   rash or lesions. ?GI:  No-   heartburn, indigestion, abdominal pain, nausea, vomiting, ?GU:  ?MS:  No-   joint pain or swelling.   ?Neuro-     nothing unusual ?Psych:  No- change in mood or affect. No depression or anxiety.  No memory loss. ? ?Objective:  ? Physical Exam ?General- Alert, Oriented, Affect-appropriate, Distress- none acute, +morbidly obese ?Skin- rash-none, lesions- none, excoriation- none ?Lymphadenopathy- none ?Head- atraumatic ?           Eyes- Gross vision intact, PERRLA, conjunctivae clear secretions ?           Ears- Hearing, canals-normal ?           Nose- Clear, no-Septal dev, mucus, polyps, erosion, perforation  ?           Throat- Mallampati IV , mucosa clear, drainage- none, tonsils- atrophic ?Neck- flexible , trachea midline, no stridor , thyroid nl, carotid no bruit ?Chest - symmetrical excursion , unlabored ?          Heart/CV- RRR , no murmur , no gallop  , no rub, nl s1 s2 ?                          - JVD- none , elastic hose+ ?  Lung- clear to P&A, wheeze- none, cough- none , dullness-none, rub- none ?          Chest wall-  ?Abd-  ?Br/ Gen/ Rectal- Not done, not indicated ?Extrem- +elastic hose R leg ?Neuro- grossly intact to observation ? ? ?

## 2021-12-15 ENCOUNTER — Ambulatory Visit: Payer: Medicare Other | Admitting: Internal Medicine

## 2021-12-16 ENCOUNTER — Ambulatory Visit: Payer: Medicare Other | Admitting: Internal Medicine

## 2021-12-16 ENCOUNTER — Encounter: Payer: Self-pay | Admitting: Internal Medicine

## 2021-12-16 VITALS — BP 132/78 | HR 57 | Temp 98.3°F | Ht 67.0 in | Wt 287.8 lb

## 2021-12-16 DIAGNOSIS — G4733 Obstructive sleep apnea (adult) (pediatric): Secondary | ICD-10-CM

## 2021-12-16 NOTE — Patient Instructions (Signed)
Order- Patient requests change DME to one in [place] area [facility]?) ? ?Please replace old CPAP machine, change to auto 10-20, mask of choice, humidifier, supplies, [employer]/ card ? ?Please call if we can help ?

## 2021-12-23 NOTE — Telephone Encounter (Signed)
Since pt's call, pt has had an OV with [facility] and CPAP concerns was discussed during that OV. ?

## 2021-12-25 NOTE — Telephone Encounter (Signed)
Patient Received cpap 06/15/15. Last reported data was 12/21/20 and at that time he was 100%.  he is part of the 3 g shut off and will need to bring his pap unit and / or card to have DL done in office.  ? ?Thank you,  ? ?Brad New  ?

## 2022-01-12 ENCOUNTER — Encounter: Payer: Self-pay | Admitting: Internal Medicine

## 2022-01-12 NOTE — Assessment & Plan Note (Signed)
Benefits from CPAP. Asks to change DME to get one closer to his home. ?Plan- change DME, maybe to [place]. Replace old CPAP, change to auto 10-20. ? ?

## 2022-01-12 NOTE — Assessment & Plan Note (Signed)
Keep working on [employer]- diet and exercise ?

## 2022-01-23 ENCOUNTER — Encounter: Payer: Self-pay | Admitting: Family Medicine

## 2022-01-23 ENCOUNTER — Ambulatory Visit: Payer: Medicare Other | Admitting: Family Medicine

## 2022-01-23 VITALS — BP 130/70 | HR 76 | Resp 16 | Ht 67.5 in | Wt 286.1 lb

## 2022-01-23 DIAGNOSIS — I10 Essential (primary) hypertension: Secondary | ICD-10-CM

## 2022-01-23 DIAGNOSIS — E785 Hyperlipidemia, unspecified: Secondary | ICD-10-CM

## 2022-01-23 DIAGNOSIS — E1169 Type 2 diabetes mellitus with other specified complication: Secondary | ICD-10-CM

## 2022-01-23 DIAGNOSIS — R7303 Prediabetes: Secondary | ICD-10-CM

## 2022-01-23 MED ORDER — UNABLE TO FIND: 0 refills | Status: DC

## 2022-01-23 MED ORDER — TIRZEPATIDE 2.5 MG/0.5ML ~~LOC~~ SOAJ: 2.5000 mg | SUBCUTANEOUS | 0 refills | Status: DC

## 2022-01-23 MED ORDER — TIRZEPATIDE 5 MG/0.5ML ~~LOC~~ SOAJ: 5.0000 mg | SUBCUTANEOUS | 2 refills | Status: DC

## 2022-01-23 NOTE — Progress Notes (Signed)
? ?Christopher Lambert     MRN: [id number]      DOB: [birth year] ? ? ?HPI ?Christopher Lambert is here for follow up and re-evaluation of chronic medical conditions, medication management and review of any available recent lab and radiology data.  ?Preventive health is updated, specifically  Cancer screening and Immunization.   ?Questions or concerns regarding consultations or procedures which the PT has had in the interim are  addressed. ?The PT denies any adverse reactions to current medications since the last visit.  ?There are no new concerns.  ?There are no specific complaints  ? ?ROS ?Denies recent fever or chills. ?Denies sinus pressure, nasal congestion, ear pain or sore throat. ?Denies chest congestion, productive cough or wheezing. ?Denies chest pains, palpitations and leg swelling ?Denies abdominal pain, nausea, vomiting,diarrhea or constipation.   ?Denies dysuria, frequency, hesitancy or incontinence. ?Denies joint pain, swelling and limitation in mobility. ?Denies headaches, seizures, numbness, or tingling. ?Denies depression, anxiety or insomnia. ?Denies skin break down or rash. ? ? ?PE ? ?BP 130/70   Pulse 76   Resp 16   Ht 5' 7.5" (1.715 m)   Wt 286 lb 1.9 oz (129.8 kg)   SpO2 95%   BMI 44.15 kg/m?  ? ?Patient alert and oriented and in no cardiopulmonary distress. ? ?HEENT: No facial asymmetry, EOMI,     Neck supple . ? ?Chest: Clear to auscultation bilaterally. ? ?CVS: S1, S2 no murmurs, no S3.Regular rate. ? ?ABD: Soft non tender.  ? ?Ext: No edema ? ?MS: Adequate ROM spine, shoulders, hips and knees. ? ?Skin: Intact, no ulcerations or rash noted. ? ?Psych: Good eye contact, normal affect. Memory intact not anxious or depressed appearing. ? ?CNS: CN 2-12 intact, power,  normal throughout.no focal deficits noted. ? ? ?Assessment & Plan ? ?Essential hypertension, benign ?Controlled, no change in medication ?DASH diet and commitment to daily physical activity for a minimum of 30 minutes discussed and encouraged,  as a part of hypertension management. ?The importance of attaining a healthy weight is also discussed. ? ? ?  01/23/2022  ? 10:15 AM 01/23/2022  ?  9:32 AM 12/16/2021  ?  1:30 PM 09/25/2021  ?  9:52 AM 09/25/2021  ?  8:56 AM 06/18/2021  ?  1:23 PM 03/20/2021  ?  8:34 AM  ?BP/Weight  ?Systolic BP 892 119 417 408 147 161 115  ?Diastolic BP 70 69 78 80 79 83 65  ?Wt. (Lbs)  286.12 287.8  289.04 298 298  ?BMI  44.15 kg/m2 45.08 kg/m2  45.27 kg/m2 45.31 kg/m2 45.98 kg/m2  ? ? ? ? ? ?Morbid obesity ([place]) ? ?Patient re-educated about  the importance of commitment to a  minimum of 150 minutes of exercise per week as able. ? ?The importance of healthy food choices with portion control discussed, as well as eating regularly and within a 12 hour window most days. ?The need to choose "clean , green" food 50 to 75% of the time is discussed, as well as to make water the primary drink and set a goal of 64 ounces water daily. ? ?  ? ?  01/23/2022  ?  9:32 AM 12/16/2021  ?  1:30 PM 09/25/2021  ?  8:56 AM  ?Weight /BMI  ?Weight 286 lb 1.9 oz 287 lb 12.8 oz 289 lb 0.6 oz  ?Height 5' 7.5" (1.715 m) '5\' 7"'$  (1.702 m) '5\' 7"'$  (1.702 m)  ?BMI 44.15 kg/m2 45.08 kg/m2 45.27 kg/m2  ? ? ? ? ?  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  ? ?The need to take medication as prescribed, test blood sugar as directed, and to call between visits if there is a concern that blood sugar is uncontrolled is also discussed.  ? ?Christopher Lambert is reminded of the importance of daily foot exam, annual eye examination, and good blood sugar, blood pressure and cholesterol control. ? ? ?  Latest Ref Rng & Units 01/23/2022  ? 10:43 AM 09/23/2021  ?  9:28 AM 03/18/2021  ?  9:07 AM 08/14/2020  ?  9:29 AM 03/11/2020  ? 10:18 AM  ?Diabetic Labs  ?HbA1c 4.8 - 5.6 % 6.5   6.6   6.8   6.1      ?Microalbumin Not Estab. ug/mL     <3.0    ?Chol 100 - 199 mg/dL   159   153     ?HDL >39 mg/dL   44   45     ?Calc LDL 0 - 99 mg/dL   97   93     ?Triglycerides 0 - 149 mg/dL   100   80     ?Creatinine 0.76 - 1.27 mg/dL 1.08   1.04   0.93   0.97     ? ? ?  01/23/2022  ? 10:15 AM 01/23/2022  ?  9:32 AM 12/16/2021  ?  1:30 PM 09/25/2021  ?  9:52 AM 09/25/2021  ?  8:56 AM 06/18/2021  ?  1:23 PM 03/20/2021  ?  8:34 AM  ?BP/Weight  ?Systolic BP 491 791 505 697 147 161 115  ?Diastolic BP 70 69 78 80 79 83 65  ?Wt. (Lbs)  286.12 287.8  289.04 298 298  ?BMI  44.15 kg/m2 45.08 kg/m2  45.27 kg/m2 45.31 kg/m2 45.98 kg/m2  ? ? ?  Latest Ref Rng & Units 02/24/2021  ? 12:00 AM 02/22/2020  ? 12:00 AM  ?Foot/eye exam completion dates  ?Eye Exam No Retinopathy No Retinopathy      No Retinopathy       ?  ? This result is from an external source.  ? ? ? ? ?Stop metformin and start moumjaro ? ?Dyslipidemia, goal LDL below 100 ?Hyperlipidemia:Low fat diet discussed and encouraged. ? ? ?Lipid Panel  ?Lab Results  ?Component Value Date  ? CHOL 159 03/18/2021  ? HDL 44 03/18/2021  ? Remer 97 03/18/2021  ? TRIG 100 03/18/2021  ? CHOLHDL 3.6 03/18/2021  ? ? ? ?Updated lab needed at/ before next visit. ? ? ? ? ? ?

## 2022-01-23 NOTE — Patient Instructions (Signed)
F/U in 7 weeks, call if you need me before ? ?NEED shingrix #2, nurse pls give rx ( states pharmacist will not give them) ? ?New for diabetes is once weekly Mounjaro, stop metformin ? ?.Goal for fasting blood sugar ranges from 80 to 120 and 2 hours after any meal or at bedtime should be between 130 to 170. ? ? ?HBA1C, chem 7 and EGFr ? ?It is important that you exercise regularly at least 30 minutes 5 times a week. If you develop chest pain, have severe difficulty breathing, or feel very tired, stop exercising immediately and seek medical attention  ? ? ?Thanks for choosing [facility], we consider it a privelige to serve you. ? ? ? ?

## 2022-01-24 LAB — HEMOGLOBIN A1C
Est. average glucose Bld gHb Est-mCnc: 140 mg/dL
Hgb A1c MFr Bld: 6.5 % — ABNORMAL HIGH (ref 4.8–5.6)

## 2022-01-24 LAB — BMP8+EGFR
BUN/Creatinine Ratio: 9 — ABNORMAL LOW (ref 10–24)
BUN: 10 mg/dL (ref 8–27)
CO2: 24 mmol/L (ref 20–29)
Calcium: 9.2 mg/dL (ref 8.6–10.2)
Chloride: 101 mmol/L (ref 96–106)
Creatinine, Ser: 1.08 mg/dL (ref 0.76–1.27)
Glucose: 130 mg/dL — ABNORMAL HIGH (ref 70–99)
Potassium: 3.5 mmol/L (ref 3.5–5.2)
Sodium: 139 mmol/L (ref 134–144)
eGFR: 77 mL/min/{1.73_m2} (ref >59)

## 2022-01-25 ENCOUNTER — Encounter: Payer: Self-pay | Admitting: Family Medicine

## 2022-01-25 NOTE — Assessment & Plan Note (Signed)
Christopher Lambert is reminded of the importance of commitment to daily physical activity for 30 minutes or more, as able and the need to limit carbohydrate intake to 30 to 60 grams per meal to help with blood sugar control.  ? ?The need to take medication as prescribed, test blood sugar as directed, and to call between visits if there is a concern that blood sugar is uncontrolled is also discussed.  ? ?Christopher Lambert is reminded of the importance of daily foot exam, annual eye examination, and good blood sugar, blood pressure and cholesterol control. ? ? ?  Latest Ref Rng & Units 01/23/2022  ? 10:43 AM 09/23/2021  ?  9:28 AM 03/18/2021  ?  9:07 AM 08/14/2020  ?  9:29 AM 03/11/2020  ? 10:18 AM  ?Diabetic Labs  ?HbA1c 4.8 - 5.6 % 6.5   6.6   6.8   6.1     ?Microalbumin Not Estab. ug/mL     <3.0    ?Chol 100 - 199 mg/dL   159   153     ?HDL >39 mg/dL   44   45     ?Calc LDL 0 - 99 mg/dL   97   93     ?Triglycerides 0 - 149 mg/dL   100   80     ?Creatinine 0.76 - 1.27 mg/dL 1.08   1.04   0.93   0.97     ? ? ?  01/23/2022  ? 10:15 AM 01/23/2022  ?  9:32 AM 12/16/2021  ?  1:30 PM 09/25/2021  ?  9:52 AM 09/25/2021  ?  8:56 AM 06/18/2021  ?  1:23 PM 03/20/2021  ?  8:34 AM  ?BP/Weight  ?Systolic BP 161 096 045 409 147 161 115  ?Diastolic BP 70 69 78 80 79 83 65  ?Wt. (Lbs)  286.12 287.8  289.04 298 298  ?BMI  44.15 kg/m2 45.08 kg/m2  45.27 kg/m2 45.31 kg/m2 45.98 kg/m2  ? ? ?  Latest Ref Rng & Units 02/24/2021  ? 12:00 AM 02/22/2020  ? 12:00 AM  ?Foot/eye exam completion dates  ?Eye Exam No Retinopathy No Retinopathy      No Retinopathy       ?  ? This result is from an external source.  ? ? ? ? ?Stop metformin and start moumjaro ?

## 2022-01-25 NOTE — Assessment & Plan Note (Signed)
?  Patient re-educated about  the importance of commitment to a  minimum of 150 minutes of exercise per week as able. ? ?The importance of healthy food choices with portion control discussed, as well as eating regularly and within a 12 hour window most days. ?The need to choose "clean , green" food 50 to 75% of the time is discussed, as well as to make water the primary drink and set a goal of 64 ounces water daily. ? ?  ? ?  01/23/2022  ?  9:32 AM 12/16/2021  ?  1:30 PM 09/25/2021  ?  8:56 AM  ?Weight /BMI  ?Weight 286 lb 1.9 oz 287 lb 12.8 oz 289 lb 0.6 oz  ?Height 5' 7.5" (1.715 m) '5\' 7"'$  (1.702 m) '5\' 7"'$  (1.702 m)  ?BMI 44.15 kg/m2 45.08 kg/m2 45.27 kg/m2  ? ? ? ?

## 2022-01-25 NOTE — Assessment & Plan Note (Signed)
Controlled, no change in medication ?DASH diet and commitment to daily physical activity for a minimum of 30 minutes discussed and encouraged, as a part of hypertension management. ?The importance of attaining a healthy weight is also discussed. ? ? ?  01/23/2022  ? 10:15 AM 01/23/2022  ?  9:32 AM 12/16/2021  ?  1:30 PM 09/25/2021  ?  9:52 AM 09/25/2021  ?  8:56 AM 06/18/2021  ?  1:23 PM 03/20/2021  ?  8:34 AM  ?BP/Weight  ?Systolic BP 703 500 938 182 147 161 115  ?Diastolic BP 70 69 78 80 79 83 65  ?Wt. (Lbs)  286.12 287.8  289.04 298 298  ?BMI  44.15 kg/m2 45.08 kg/m2  45.27 kg/m2 45.31 kg/m2 45.98 kg/m2  ? ? ? ? ?

## 2022-01-25 NOTE — Assessment & Plan Note (Signed)
Hyperlipidemia:Low fat diet discussed and encouraged.   Lipid Panel  Lab Results  Component Value Date   CHOL 159 03/18/2021   HDL 44 03/18/2021   LDLCALC 97 03/18/2021   TRIG 100 03/18/2021   CHOLHDL 3.6 03/18/2021     Updated lab needed at/ before next visit.  

## 2022-02-14 ENCOUNTER — Other Ambulatory Visit: Payer: Self-pay | Admitting: Family Medicine

## 2022-03-05 ENCOUNTER — Telehealth: Payer: Self-pay

## 2022-03-05 ENCOUNTER — Other Ambulatory Visit: Payer: Self-pay | Admitting: Family Medicine

## 2022-03-05 ENCOUNTER — Emergency Department (HOSPITAL_COMMUNITY): Payer: Medicare Other

## 2022-03-05 ENCOUNTER — Other Ambulatory Visit: Payer: Self-pay

## 2022-03-05 ENCOUNTER — Encounter (HOSPITAL_COMMUNITY): Payer: Self-pay | Admitting: *Deleted

## 2022-03-05 ENCOUNTER — Emergency Department (HOSPITAL_COMMUNITY)
Admission: EM | Admit: 2022-03-05 | Discharge: 2022-03-05 | Disposition: A | Discharge status: Home / Self Care | Payer: Medicare Other | Attending: Emergency Medicine | Admitting: Emergency Medicine

## 2022-03-05 DIAGNOSIS — Z7982 Long term (current) use of aspirin: Secondary | ICD-10-CM | POA: Insufficient documentation

## 2022-03-05 DIAGNOSIS — E876 Hypokalemia: Secondary | ICD-10-CM | POA: Insufficient documentation

## 2022-03-05 DIAGNOSIS — M79604 Pain in right leg: Secondary | ICD-10-CM | POA: Diagnosis present

## 2022-03-05 DIAGNOSIS — L03115 Cellulitis of right lower limb: Secondary | ICD-10-CM | POA: Diagnosis not present

## 2022-03-05 LAB — CBC WITH DIFFERENTIAL/PLATELET
Abs Immature Granulocytes: 0.02 10*3/uL (ref 0.00–0.07)
Basophils Absolute: 0 10*3/uL (ref 0.0–0.1)
Basophils Relative: 1 %
Eosinophils Absolute: 0.1 10*3/uL (ref 0.0–0.5)
Eosinophils Relative: 2 %
HCT: 42.9 % (ref 39.0–52.0)
Hemoglobin: 15 g/dL (ref 13.0–17.0)
Immature Granulocytes: 0 %
Lymphocytes Relative: 18 %
Lymphs Abs: 1 10*3/uL (ref 0.7–4.0)
MCH: 31.5 pg (ref 26.0–34.0)
MCHC: 35 g/dL (ref 30.0–36.0)
MCV: 90.1 fL (ref 80.0–100.0)
Monocytes Absolute: 0.4 10*3/uL (ref 0.1–1.0)
Monocytes Relative: 7 %
Neutro Abs: 4 10*3/uL (ref 1.7–7.7)
Neutrophils Relative %: 72 %
Platelets: 200 10*3/uL (ref 150–400)
RBC: 4.76 MIL/uL (ref 4.22–5.81)
RDW: 13.4 % (ref 11.5–15.5)
WBC: 5.6 10*3/uL (ref 4.0–10.5)
nRBC: 0 % (ref 0.0–0.2)

## 2022-03-05 LAB — BASIC METABOLIC PANEL
Anion gap: 10 (ref 5–15)
BUN: 18 mg/dL (ref 8–23)
CO2: 23 mmol/L (ref 22–32)
Calcium: 8.2 mg/dL — ABNORMAL LOW (ref 8.9–10.3)
Chloride: 104 mmol/L (ref 98–111)
Creatinine, Ser: 1.08 mg/dL (ref 0.61–1.24)
GFR, Estimated: >60 mL/min (ref >60)
Glucose, Bld: 171 mg/dL — ABNORMAL HIGH (ref 70–99)
Potassium: 3 mmol/L — ABNORMAL LOW (ref 3.5–5.1)
Sodium: 137 mmol/L (ref 135–145)

## 2022-03-05 MED ORDER — SODIUM CHLORIDE 0.9 % IV SOLN
1.0000 g | Freq: Once | INTRAVENOUS | Status: AC
  Administered 2022-03-05: 1 g via INTRAVENOUS
  Filled 2022-03-05: qty 10

## 2022-03-05 MED ORDER — CEPHALEXIN 500 MG PO CAPS: 500.0000 mg | ORAL_CAPSULE | Freq: Four times a day (QID) | ORAL | 0 refills | Status: DC

## 2022-03-05 MED ORDER — POTASSIUM CHLORIDE 20 MEQ PO PACK
60.0000 meq | PACK | Freq: Once | ORAL | Status: AC
  Administered 2022-03-05: 60 meq via ORAL
  Filled 2022-03-05: qty 3

## 2022-03-05 NOTE — Discharge Instructions (Addendum)
Please take Keflex 4 times a day for the next 5 days.  I would like for you to follow-up with your PCP early next week to ensure resolution of this leg infection.  Please return to the emergency department sooner for any worsening symptoms you might have.

## 2022-03-05 NOTE — Telephone Encounter (Signed)
Pt called stating his leg was hot like it was a couple of years ago before his last procedure.  Pt called stating that he was having swelling and significant pain in his legs to the point that he couldn't sleep. The pain started Sunday evening after coming home from church. He thought he'd just been on his legs too much that day. He elevated his legs. He is now complaining of swelling, 8.5/10 pain, two sore hot to the touch areas (groin, calf). He was directed to go to the ED immediately to r/o a DVT d/t worsening symptoms. Appt to be scheduled for Dr Scot Dock for next week and will call pt with date and time. Pt confirmed understanding.

## 2022-03-05 NOTE — ED Triage Notes (Addendum)
Here from home for leg pain. Heat and swelling present. Onset Sunday 4d ago. Ambulatory. H/o R leg vein surgery. Concerned about blood clot. Denies fever. Significant swelling noted to R leg. Denies CP or SOB.

## 2022-03-17 ENCOUNTER — Other Ambulatory Visit: Payer: Self-pay | Admitting: Family Medicine

## 2022-03-18 ENCOUNTER — Telehealth: Payer: Self-pay

## 2022-03-18 ENCOUNTER — Ambulatory Visit: Payer: Medicare Other | Admitting: Family Medicine

## 2022-03-18 ENCOUNTER — Encounter: Payer: Self-pay | Admitting: Family Medicine

## 2022-03-18 VITALS — BP 124/73 | HR 84 | Ht 67.5 in | Wt 281.0 lb

## 2022-03-18 DIAGNOSIS — Z125 Encounter for screening for malignant neoplasm of prostate: Secondary | ICD-10-CM

## 2022-03-18 DIAGNOSIS — Z23 Encounter for immunization: Secondary | ICD-10-CM

## 2022-03-18 DIAGNOSIS — E785 Hyperlipidemia, unspecified: Secondary | ICD-10-CM | POA: Diagnosis not present

## 2022-03-18 DIAGNOSIS — E1169 Type 2 diabetes mellitus with other specified complication: Secondary | ICD-10-CM | POA: Diagnosis not present

## 2022-03-18 DIAGNOSIS — I10 Essential (primary) hypertension: Secondary | ICD-10-CM

## 2022-03-18 DIAGNOSIS — Z1211 Encounter for screening for malignant neoplasm of colon: Secondary | ICD-10-CM

## 2022-03-18 DIAGNOSIS — I83891 Varicose veins of right lower extremities with other complications: Secondary | ICD-10-CM

## 2022-03-18 MED ORDER — DOXYCYCLINE HYCLATE 100 MG PO TABS: 100.0000 mg | ORAL_TABLET | Freq: Two times a day (BID) | ORAL | 0 refills | Status: DC

## 2022-03-18 MED ORDER — METFORMIN HCL 500 MG PO TABS: 500.0000 mg | ORAL_TABLET | Freq: Every day | ORAL | 3 refills | Status: DC

## 2022-03-18 NOTE — Patient Instructions (Addendum)
F/U in 4 months, flu vaccine at visit,   Non Fasting HBA1C, chem 7 and EGFR 5 days before Nov visit  Please get covid booster in 2 to 3 weeks  Pneumonia 20 today  Fasting Lipid, cmp and eGFr, microalb, PSA, uric acid July 14 or after, results will be sent via My chart  Nurse please enter   No retinopathy" for recent eye exam  Nurse please order cologuard test  1 week of doxycycline is prescribed for swollen red right leg and you are referred to Vein Specialist  Congrats on weight loss, keep it up!  It is important that you exercise regularly at least 30 minutes 5 times a week. If you develop chest pain, have severe difficulty breathing, or feel very tired, stop exercising immediately and seek medical attention  Thanks for choosing [facility], we consider it a privelige to serve you.

## 2022-03-18 NOTE — Telephone Encounter (Signed)
Pt called stating that he was having some R leg swelling, heat, had done a couple of rounds of antibiotics, and wanted to know if he should be seen. He had a laser ablation on 1/27 of the R GSV with Dr Scot Dock.  Reviewed pt's chart, returned pt's call for clarification, two identifiers used. Pt stated that in addition to above listed symptoms, his leg was red from the knee down and he has had these symptoms for the past 3 weeks. He went to the ED on 6/22 and they r/o a DVT. He wears his compressions daily. Spoke with Dr Scot Dock who advised that he elevate properly, take Ibuprofen, use warm compresses, and make an appt for 2-3 weeks from now. Pt informed of advice and that Davy Pique RN would be reaching out with an appt for a Wednesday in the next 2-3 weeks. Pt confirmed understanding.

## 2022-03-22 ENCOUNTER — Encounter: Payer: Self-pay | Admitting: Family Medicine

## 2022-03-22 NOTE — Assessment & Plan Note (Signed)
Controlled, no change in medication Mr. Hutmacher is reminded of the importance of commitment to daily physical activity for 30 minutes or more, as able and the need to limit carbohydrate intake to 30 to 60 grams per meal to help with blood sugar control.   The need to take medication as prescribed, test blood sugar as directed, and to call between visits if there is a concern that blood sugar is uncontrolled is also discussed.   Mr. Maish is reminded of the importance of daily foot exam, annual eye examination, and good blood sugar, blood pressure and cholesterol control.     Latest Ref Rng & Units 03/05/2022   11:36 AM 01/23/2022   10:43 AM 09/23/2021    9:28 AM 03/18/2021    9:07 AM 08/14/2020    9:29 AM  Diabetic Labs  HbA1c 4.8 - 5.6 %  6.5  6.6  6.8  6.1   Chol 100 - 199 mg/dL    159  153   HDL >39 mg/dL    44  45   Calc LDL 0 - 99 mg/dL    97  93   Triglycerides 0 - 149 mg/dL    100  80   Creatinine 0.61 - 1.24 mg/dL 1.08  1.08  1.04  0.93  0.97       03/18/2022    8:30 AM 03/05/2022    3:00 PM 03/05/2022    2:30 PM 03/05/2022    2:00 PM 03/05/2022    1:52 PM 03/05/2022    1:30 PM 03/05/2022    1:00 PM  BP/Weight  Systolic BP 382 505 397 673 419 379 024  Diastolic BP 73 68 67 72 67 67 66  Wt. (Lbs) 281.04        BMI 43.37 kg/m2            Latest Ref Rng & Units 02/24/2021   12:00 AM 02/22/2020   12:00 AM  Foot/eye exam completion dates  Eye Exam No Retinopathy No Retinopathy     No Retinopathy         This result is from an external source.

## 2022-03-22 NOTE — Assessment & Plan Note (Signed)
Hyperlipidemia:Low fat diet discussed and encouraged.   Lipid Panel  Lab Results  Component Value Date   CHOL 159 03/18/2021   HDL 44 03/18/2021   LDLCALC 97 03/18/2021   TRIG 100 03/18/2021   CHOLHDL 3.6 03/18/2021     Updated lab needed at/ before next visit.

## 2022-03-22 NOTE — Assessment & Plan Note (Signed)
Persistent swelling and redness of right lower ext with distended varicose veins, refer vascular and additional  1 week of antibiotic prescribed

## 2022-03-22 NOTE — Assessment & Plan Note (Signed)
Improved  Patient re-educated about  the importance of commitment to a  minimum of 150 minutes of exercise per week as able.  The importance of healthy food choices with portion control discussed, as well as eating regularly and within a 12 hour window most days. The need to choose "clean , green" food 50 to 75% of the time is discussed, as well as to make water the primary drink and set a goal of 64 ounces water daily.       03/18/2022    8:30 AM 03/05/2022   11:04 AM 01/23/2022    9:32 AM  Weight /BMI  Weight 281 lb 0.6 oz 285 lb 286 lb 1.9 oz  Height 5' 7.5" (1.715 m) '5\' 7"'$  (1.702 m) 5' 7.5" (1.715 m)  BMI 43.37 kg/m2 44.64 kg/m2 44.15 kg/m2

## 2022-03-22 NOTE — Progress Notes (Signed)
Christopher Lambert     MRN: [id number]      DOB: [birth year]   HPI Christopher Lambert is here for follow up and re-evaluation of chronic medical conditions, medication management and review of any available recent lab and radiology data.  Preventive health is updated, specifically  Cancer screening and Immunization.   Questions or concerns regarding consultations or procedures which the PT has had in the interim are  addressed. The PT denies any adverse reactions to current medications since the last visit.  Swelling and redness of right leg for several weeks despite antibiotic treatment , has had problems with this leg in the past  Denies polyuria, polydipsia, blurred vision , or hypoglycemic episodes.   ROS Denies recent fever or chills. Denies sinus pressure, nasal congestion, ear pain or sore throat. Denies chest congestion, productive cough or wheezing. Denies chest pains, palpitations and leg swelling Denies abdominal pain, nausea, vomiting,diarrhea or constipation.   Denies dysuria, frequency, hesitancy or incontinence. Denies joint pain, swelling and limitation in mobility. Denies headaches, seizures, numbness, or tingling. Denies depression, anxiety or insomnia.  PE  BP 124/73   Pulse 84   Ht 5' 7.5" (1.715 m)   Wt 281 lb 0.6 oz (127.5 kg)   SpO2 93%   BMI 43.37 kg/m   Patient alert and oriented and in no cardiopulmonary distress.  HEENT: No facial asymmetry, EOMI,     Neck supple .  Chest: Clear to auscultation bilaterally.  CVS: S1, S2 no murmurs, no S3.Regular rate.  ABD: Soft non tender.   Ext: No edema  MS: Adequate ROM spine, shoulders, hips and knees.  Skin: Intact, erythema and warmth of right leg.varicose veins distended  Psych: Good eye contact, normal affect. Memory intact not anxious or depressed appearing.  CNS: CN 2-12 intact, power,  normal throughout.no focal deficits noted.   Assessment & Plan  DM type 2 with diabetic dyslipidemia (HCC) Controlled,  no change in medication Christopher Lambert is reminded of the importance of commitment to daily physical activity for 30 minutes or more, as able and the need to limit carbohydrate intake to 30 to 60 grams per meal to help with blood sugar control.   The need to take medication as prescribed, test blood sugar as directed, and to call between visits if there is a concern that blood sugar is uncontrolled is also discussed.   Christopher Lambert is reminded of the importance of daily foot exam, annual eye examination, and good blood sugar, blood pressure and cholesterol control.     Latest Ref Rng & Units 03/05/2022   11:36 AM 01/23/2022   10:43 AM 09/23/2021    9:28 AM 03/18/2021    9:07 AM 08/14/2020    9:29 AM  Diabetic Labs  HbA1c 4.8 - 5.6 %  6.5  6.6  6.8  6.1   Chol 100 - 199 mg/dL    159  153   HDL >39 mg/dL    44  45   Calc LDL 0 - 99 mg/dL    97  93   Triglycerides 0 - 149 mg/dL    100  80   Creatinine 0.61 - 1.24 mg/dL 1.08  1.08  1.04  0.93  0.97       03/18/2022    8:30 AM 03/05/2022    3:00 PM 03/05/2022    2:30 PM 03/05/2022    2:00 PM 03/05/2022    1:52 PM 03/05/2022    1:30 PM 03/05/2022  1:00 PM  BP/Weight  Systolic BP 951 884 166 063 016 010 932  Diastolic BP 73 68 67 72 67 67 66  Wt. (Lbs) 281.04        BMI 43.37 kg/m2            Latest Ref Rng & Units 02/24/2021   12:00 AM 02/22/2020   12:00 AM  Foot/eye exam completion dates  Eye Exam No Retinopathy No Retinopathy     No Retinopathy         This result is from an external source.        Essential hypertension, benign Controlled, no change in medication DASH diet and commitment to daily physical activity for a minimum of 30 minutes discussed and encouraged, as a part of hypertension management. The importance of attaining a healthy weight is also discussed.     03/18/2022    8:30 AM 03/05/2022    3:00 PM 03/05/2022    2:30 PM 03/05/2022    2:00 PM 03/05/2022    1:52 PM 03/05/2022    1:30 PM 03/05/2022    1:00 PM  BP/Weight   Systolic BP 355 732 202 542 706 237 628  Diastolic BP 73 68 67 72 67 67 66  Wt. (Lbs) 281.04        BMI 43.37 kg/m2             Dyslipidemia, goal LDL below 100 Hyperlipidemia:Low fat diet discussed and encouraged.   Lipid Panel  Lab Results  Component Value Date   CHOL 159 03/18/2021   HDL 44 03/18/2021   LDLCALC 97 03/18/2021   TRIG 100 03/18/2021   CHOLHDL 3.6 03/18/2021     Updated lab needed at/ before next visit.   Morbid obesity ([place]) Improved  Patient re-educated about  the importance of commitment to a  minimum of 150 minutes of exercise per week as able.  The importance of healthy food choices with portion control discussed, as well as eating regularly and within a 12 hour window most days. The need to choose "clean , green" food 50 to 75% of the time is discussed, as well as to make water the primary drink and set a goal of 64 ounces water daily.       03/18/2022    8:30 AM 03/05/2022   11:04 AM 01/23/2022    9:32 AM  Weight /BMI  Weight 281 lb 0.6 oz 285 lb 286 lb 1.9 oz  Height 5' 7.5" (1.715 m) '5\' 7"'$  (1.702 m) 5' 7.5" (1.715 m)  BMI 43.37 kg/m2 44.64 kg/m2 44.15 kg/m2      Varicose veins of leg with swelling, right Persistent swelling and redness of right lower ext with distended varicose veins, refer vascular and additional  1 week of antibiotic prescribed

## 2022-03-22 NOTE — Assessment & Plan Note (Signed)
Controlled, no change in medication DASH diet and commitment to daily physical activity for a minimum of 30 minutes discussed and encouraged, as a part of hypertension management. The importance of attaining a healthy weight is also discussed.     03/18/2022    8:30 AM 03/05/2022    3:00 PM 03/05/2022    2:30 PM 03/05/2022    2:00 PM 03/05/2022    1:52 PM 03/05/2022    1:30 PM 03/05/2022    1:00 PM  BP/Weight  Systolic BP 970 263 785 885 027 741 287  Diastolic BP 73 68 67 72 67 67 66  Wt. (Lbs) 281.04        BMI 43.37 kg/m2

## 2022-03-23 ENCOUNTER — Other Ambulatory Visit: Payer: Self-pay | Admitting: Family Medicine

## 2022-03-24 NOTE — Telephone Encounter (Signed)
Pt currently on Simvastatin and wants to Lovastatin. Please advise.

## 2022-03-25 ENCOUNTER — Telehealth: Payer: Self-pay | Admitting: *Deleted

## 2022-03-25 NOTE — Telephone Encounter (Signed)
Following up with Christopher Lambert to set up appointment with Dr. Scot Dock to evaluate right leg swelling and pain.  Appointment made for 04-01-2022 at 8:30 AM with Dr. Scot Dock.  Christopher Lambert aware of appointment date and time.

## 2022-04-01 ENCOUNTER — Other Ambulatory Visit: Payer: Self-pay | Admitting: Family Medicine

## 2022-04-01 ENCOUNTER — Ambulatory Visit: Payer: Medicare Other | Admitting: Vascular Surgery

## 2022-04-01 DIAGNOSIS — R972 Elevated prostate specific antigen [PSA]: Secondary | ICD-10-CM

## 2022-04-02 ENCOUNTER — Ambulatory Visit: Payer: Medicare Other | Admitting: Vascular Surgery

## 2022-04-02 ENCOUNTER — Encounter: Payer: Self-pay | Admitting: Vascular Surgery

## 2022-04-02 VITALS — BP 145/87 | HR 81 | Temp 98.2°F | Resp 20 | Ht 67.5 in | Wt 285.0 lb

## 2022-04-02 DIAGNOSIS — I872 Venous insufficiency (chronic) (peripheral): Secondary | ICD-10-CM

## 2022-04-02 DIAGNOSIS — I890 Lymphedema, not elsewhere classified: Secondary | ICD-10-CM | POA: Diagnosis not present

## 2022-04-02 NOTE — Progress Notes (Signed)
REASON FOR VISIT:   Cellulitis of right leg  MEDICAL ISSUES:   CHRONIC VENOUS INSUFFICIENCY/LYMPHEDEMA: This patient had an episode of cellulitis in the right leg in June that was successfully treated with p.o. antibiotics.  On exam he has evidence of some underlying chronic venous insufficiency and lymphedema.  I have again reinforced with him the importance of elevation and compression.  He is very faithful with his routine and continues to elevate his legs daily and wear his compression stockings.  He also tries to stay as active as possible.  I did explain that with his underlying venous insufficiency and lymphedema that the subcutaneous infections take longer to treat given that the intracellular fluid is stagnant.  I explained how elevation and compression help resolve these infections quicker and he has responded nicely to treatment.  I will be happy to see him back at any time if any new vascular issues arise   HPI:   Christopher Lambert is a pleasant 66 y.o. male who I had originally seen in December 2021 with fairly advanced venous disease.  He had failed conservative treatment and therefore on 10/10/2020 he underwent laser ablation of the right great saphenous vein.  His follow-up study showed no evidence of DVT in the right lower extremity and successful closure of the right great saphenous vein from the proximal calf to within 1.8 cm of the saphenofemoral junction.  In June, he developed a fairly sudden onset of redness and warmth in the right calf.  He went to the ER and had an ultrasound that showed no evidence of DVT.  He was treated for cellulitis.  The initial antibiotic was unsuccessful but then he was started on doxycycline by Dr. Moshe Cipro and the cellulitis resolved.  He comes in for a follow-up given his history of venous disease.  He does elevate his legs daily and wear compression stockings.  He has had no significant pain in his legs and the cellulitis has completely resolved at  this point.  He tells me that he is back to his baseline.  Past Medical History:  Diagnosis Date   Abnormal cardiovascular function study 09/21/2011   Arthritis of knee    Bilateral   Diabetes mellitus 06/2008   type 2 NIDDM x 7 yrs; off meds   Diabetes mellitus without complication ([place])    Phreesia 08/18/2020   ED (erectile dysfunction)    Essential hypertension, benign 1990   H/O: whooping cough    aschild   Hemorrhoids, internal    Hypertension    Phreesia 71/69/6789   Metabolic syndrome 3/81/0175   Obesity    Obstructive sleep apnea 11/09/2007   NPSG 08/26/04 AHI 92/hr, [place], [place] NPSG 01/31/15- severe OSA AHI 49.4/ hr, CPAP suggested 13, AHI 0.0, weight 294 lbs CPAP Advanced     Palpitations 08/24/2011   Piles (hemorrhoids) 06/19/2014   Seasonal allergies    Sleep apnea 2004   wears CPAP nightly      Family History  Problem Relation Age of Onset   Coronary artery disease Brother 41       Premature   Hypertension Brother    Hypertension Father    Cancer Father 72       colon    Hypertension Mother    Diabetes Brother    Hypertension Sister    Hypertension Sister    Hypertension Sister    Hypertension Sister     SOCIAL HISTORY: Social History   Tobacco Use   Smoking  status: Former    Packs/day: 0.50    Types: Cigarettes   Smokeless tobacco: Never  Substance Use Topics   Alcohol use: No    No Known Allergies  Current Outpatient Medications  Medication Sig Dispense Refill   allopurinol (ZYLOPRIM) 300 MG tablet TAKE 1 TABLET BY MOUTH EVERY DAY 90 tablet 1   aspirin 81 MG EC tablet TAKE 1 TABLET EVERY DAY (Patient taking differently: Take 81 mg by mouth daily.) 150 tablet 3   cholecalciferol (VITAMIN D) 1000 UNITS tablet Take 1,000 Units by mouth daily.     ELDERBERRY PO Take 1 tablet by mouth daily.      fluticasone (FLONASE) 50 MCG/ACT nasal spray PLACE 2 SPRAYS INTO BOTH NOSTRILS 2 (TWO) TIMES DAILY 48 mL 1   furosemide (LASIX) 20 MG tablet TAKE 1 TABLET  BY MOUTH EVERY DAY 90 tablet 2   metFORMIN (GLUCOPHAGE) 500 MG tablet Take 1 tablet (500 mg total) by mouth daily with breakfast. 90 tablet 3   Multiple Vitamin (MULTIVITAMIN WITH MINERALS) TABS tablet Take 1 tablet by mouth daily.     NIFEdipine (PROCARDIA XL/NIFEDICAL-XL) 90 MG 24 hr tablet TAKE 1 TABLET BY MOUTH EVERY DAY 90 tablet 1   potassium chloride (KLOR-CON) 10 MEQ tablet TAKE 2 TABLETS BY MOUTH EVERY DAY 180 tablet 0   simvastatin (ZOCOR) 5 MG tablet Take 1 tablet (5 mg total) by mouth at bedtime. 90 tablet 3   No current facility-administered medications for this visit.    REVIEW OF SYSTEMS:  '[X]'$  denotes positive finding, '[ ]'$  denotes negative finding Cardiac  Comments:  Chest pain or chest pressure:    Shortness of breath upon exertion:    Short of breath when lying flat:    Irregular heart rhythm:        Vascular    Pain in calf, thigh, or hip brought on by ambulation:    Pain in feet at night that wakes you up from your sleep:     Blood clot in your veins:    Leg swelling:  x       Pulmonary    Oxygen at home:    Productive cough:     Wheezing:         Neurologic    Sudden weakness in arms or legs:     Sudden numbness in arms or legs:     Sudden onset of difficulty speaking or slurred speech:    Temporary loss of vision in one eye:     Problems with dizziness:         Gastrointestinal    Blood in stool:     Vomited blood:         Genitourinary    Burning when urinating:     Blood in urine:        Psychiatric    Major depression:         Hematologic    Bleeding problems:    Problems with blood clotting too easily:        Skin    Rashes or ulcers:        Constitutional    Fever or chills:     PHYSICAL EXAM:   Vitals:   04/02/22 1538  BP: (!) 145/87  Pulse: 81  Resp: 20  Temp: 98.2 F (36.8 C)  SpO2: 97%  Weight: 285 lb (129.3 kg)  Height: 5' 7.5" (1.715 m)    GENERAL: The patient is a well-nourished male, in no acute distress.  The  vital signs are documented above. CARDIAC: There is a regular rate and rhythm.  VASCULAR: I do not detect carotid bruits. He has palpable pedal pulses. He has bilateral lower extremity swelling which is more significant on the right side. PULMONARY: There is good air exchange bilaterally without wheezing or rales. ABDOMEN: Soft and non-tender with normal pitched bowel sounds.  MUSCULOSKELETAL: There are no major deformities or cyanosis. NEUROLOGIC: No focal weakness or paresthesias are detected. SKIN: There are no ulcers or rashes noted. PSYCHIATRIC: The patient has a normal affect.  DATA:    VENOUS DUPLEX: I did review his venous duplex scan of the right lower extremity that was done on 03/05/2022.  This showed no evidence of DVT.  Deitra Mayo [facility] [phone]

## 2022-04-03 LAB — CMP14+EGFR
ALT: 25 IU/L (ref 0–44)
AST: 19 IU/L (ref 0–40)
Albumin/Globulin Ratio: 1.5 (ref 1.2–2.2)
Albumin: 4.1 g/dL (ref 3.9–4.9)
Alkaline Phosphatase: 47 IU/L (ref 44–121)
BUN/Creatinine Ratio: 11 (ref 10–24)
BUN: 11 mg/dL (ref 8–27)
Bilirubin Total: 0.6 mg/dL (ref 0.0–1.2)
CO2: 23 mmol/L (ref 20–29)
Calcium: 8.8 mg/dL (ref 8.6–10.2)
Chloride: 103 mmol/L (ref 96–106)
Creatinine, Ser: 1.03 mg/dL (ref 0.76–1.27)
Globulin, Total: 2.8 g/dL (ref 1.5–4.5)
Glucose: 135 mg/dL — ABNORMAL HIGH (ref 70–99)
Potassium: 3.9 mmol/L (ref 3.5–5.2)
Sodium: 140 mmol/L (ref 134–144)
Total Protein: 6.9 g/dL (ref 6.0–8.5)
eGFR: 81 mL/min/{1.73_m2} (ref >59)

## 2022-04-03 LAB — MICROALBUMIN / CREATININE URINE RATIO
Creatinine, Urine: 100.9 mg/dL
Microalb/Creat Ratio: <3 mg/g creat (ref 0–29)
Microalbumin, Urine: <3 ug/mL

## 2022-04-03 LAB — LIPID PANEL
Chol/HDL Ratio: 3.3 ratio (ref 0.0–5.0)
Cholesterol, Total: 139 mg/dL (ref 100–199)
HDL: 42 mg/dL (ref >39)
LDL Chol Calc (NIH): 75 mg/dL (ref 0–99)
Triglycerides: 122 mg/dL (ref 0–149)
VLDL Cholesterol Cal: 22 mg/dL (ref 5–40)

## 2022-04-03 LAB — PSA: Prostate Specific Ag, Serum: 6.4 ng/mL — ABNORMAL HIGH (ref 0.0–4.0)

## 2022-04-03 LAB — COLOGUARD: COLOGUARD: NEGATIVE

## 2022-04-07 ENCOUNTER — Other Ambulatory Visit: Payer: Self-pay | Admitting: Family Medicine

## 2022-04-14 ENCOUNTER — Ambulatory Visit: Payer: Medicare Other | Admitting: Urology

## 2022-04-14 VITALS — BP 144/77 | HR 69

## 2022-04-14 DIAGNOSIS — R972 Elevated prostate specific antigen [PSA]: Secondary | ICD-10-CM | POA: Diagnosis not present

## 2022-04-14 LAB — URINALYSIS, ROUTINE W REFLEX MICROSCOPIC
Bilirubin, UA: NEGATIVE
Glucose, UA: NEGATIVE
Ketones, UA: NEGATIVE
Leukocytes,UA: NEGATIVE
Nitrite, UA: NEGATIVE
Protein,UA: NEGATIVE
Specific Gravity, UA: 1.015 (ref 1.005–1.030)
Urobilinogen, Ur: 0.2 mg/dL (ref 0.2–1.0)
pH, UA: 6.5 (ref 5.0–7.5)

## 2022-04-14 LAB — MICROSCOPIC EXAMINATION
Bacteria, UA: NONE SEEN
Epithelial Cells (non renal): NONE SEEN /hpf (ref 0–10)
WBC, UA: NONE SEEN /hpf (ref 0–5)

## 2022-04-14 NOTE — Progress Notes (Signed)
04/14/2022 10:11 AM   Christopher Lambert Jun 18, 1956 [id number]  Referring provider: [name], MD [address] [place],  [place] [address]  Elevated PSA   HPI:  Christopher Lambert is a 66yo here for followup for elevated PSA. He was last seen in 2021. PSA was 4-4.5 and then increased to 6.4 this year. No worsening LUTS. No dysuria or hematuria.   PMH: Past Medical History:  Diagnosis Date   Abnormal cardiovascular function study 09/21/2011   Arthritis of knee    Bilateral   Diabetes mellitus 06/2008   type 2 NIDDM x 7 yrs; off meds   Diabetes mellitus without complication ([place])    Phreesia 08/18/2020   ED (erectile dysfunction)    Essential hypertension, benign 1990   H/O: whooping cough    aschild   Hemorrhoids, internal    Hypertension    Phreesia 31/51/7616   Metabolic syndrome 0/73/7106   Obesity    Obstructive sleep apnea 11/09/2007   NPSG 08/26/04 AHI 92/hr, [place], [place] NPSG 01/31/15- severe OSA AHI 49.4/ hr, CPAP suggested 13, AHI 0.0, weight 294 lbs CPAP Advanced     Palpitations 08/24/2011   Piles (hemorrhoids) 06/19/2014   Seasonal allergies    Sleep apnea 2004   wears CPAP nightly      Surgical History: Past Surgical History:  Procedure Laterality Date   CARDIAC CATHETERIZATION  09/25/2011   CARPAL TUNNEL RELEASE     Right    COLONOSCOPY N/A 03/23/2018   Procedure: COLONOSCOPY;  Surgeon: Daneil Dolin, MD;  [facility];  Service: Endoscopy;  Laterality: N/A;  8:00   ENDOVENOUS ABLATION SAPHENOUS VEIN W/ LASER Right 10/10/2020   endovenous laser ablation right greater saphenous vein by Gae Gallop MD    HERNIA REPAIR  2694 approx   umbilical   JOINT REPLACEMENT  2011   right knee   JOINT REPLACEMENT  2013   left knee   left knee replacement   sept 2013   POLYPECTOMY  03/23/2018   Procedure: POLYPECTOMY;  Surgeon: Daneil Dolin, MD;  [facility];  Service: Endoscopy;;  colon   Right knee replacement  06/2010   TOTAL  KNEE ARTHROPLASTY  05/18/2012   Procedure: TOTAL KNEE ARTHROPLASTY;  Surgeon: Ninetta Lights, MD;  Location: [facility];  Service: Orthopedics;  Laterality: Left;  left total knee arthroplasty   UMBILICAL HERNIA REPAIR      Home Medications:  Allergies as of 04/14/2022   No Known Allergies      Medication List        Accurate as of April 14, 2022 10:11 AM. If you have any questions, ask your nurse or doctor.          allopurinol 300 MG tablet Commonly known as: ZYLOPRIM TAKE 1 TABLET BY MOUTH EVERY DAY   aspirin EC 81 MG tablet TAKE 1 TABLET EVERY DAY   cholecalciferol 1000 units tablet Commonly known as: VITAMIN D Take 1,000 Units by mouth daily.   ELDERBERRY PO Take 1 tablet by mouth daily.   fluticasone 50 MCG/ACT nasal spray Commonly known as: FLONASE PLACE 2 SPRAYS INTO BOTH NOSTRILS 2 (TWO) TIMES DAILY   furosemide 20 MG tablet Commonly known as: LASIX TAKE 1 TABLET BY MOUTH EVERY DAY   metFORMIN 500 MG tablet Commonly known as: GLUCOPHAGE Take 1 tablet (500 mg total) by mouth daily with breakfast.   multivitamin with minerals Tabs tablet Take 1 tablet by mouth daily.  NIFEdipine 90 MG 24 hr tablet Commonly known as: PROCARDIA XL/NIFEDICAL-XL TAKE 1 TABLET BY MOUTH EVERY DAY   potassium chloride 10 MEQ tablet Commonly known as: KLOR-CON TAKE 2 TABLETS BY MOUTH EVERY DAY   simvastatin 5 MG tablet Commonly known as: Zocor Take 1 tablet (5 mg total) by mouth at bedtime.        Allergies: No Known Allergies  Family History: Family History  Problem Relation Age of Onset   Coronary artery disease Brother 28       Premature   Hypertension Brother    Hypertension Father    Cancer Father 35       colon    Hypertension Mother    Diabetes Brother    Hypertension Sister    Hypertension Sister    Hypertension Sister    Hypertension Sister     Social History:  reports that he has quit smoking. His smoking use included cigarettes. He smoked an  average of .5 packs per day. He has never used smokeless tobacco. He reports that he does not drink alcohol and does not use drugs.  ROS: All other review of systems were reviewed and are negative except what is noted above in HPI  Physical Exam: BP (!) 144/77   Pulse 69   Constitutional:  Alert and oriented, No acute distress. HEENT: [place] AT, moist mucus membranes.  Trachea midline, no masses. Cardiovascular: No clubbing, cyanosis, or edema. Respiratory: Normal respiratory effort, no increased work of breathing. GI: Abdomen is soft, nontender, nondistended, no abdominal masses GU: No CVA tenderness. Circumcised phallus. No masses/lesions on penis, testis, scrotum. Prostate 40g smooth no nodules no induration.  Lymph: No cervical or inguinal lymphadenopathy. Skin: No rashes, bruises or suspicious lesions. Neurologic: Grossly intact, no focal deficits, moving all 4 extremities. Psychiatric: Normal mood and affect.  Laboratory Data: Lab Results  Component Value Date   WBC 5.6 03/05/2022   HGB 15.0 03/05/2022   HCT 42.9 03/05/2022   MCV 90.1 03/05/2022   PLT 200 03/05/2022    Lab Results  Component Value Date   CREATININE 1.03 03/31/2022    Lab Results  Component Value Date   PSA 2.7 11/30/2019   PSA 4.5 (H) 04/11/2019   PSA 3.0 09/20/2017    No results found for: "TESTOSTERONE"  Lab Results  Component Value Date   HGBA1C 6.5 (H) 01/23/2022    Urinalysis    Component Value Date/Time   COLORURINE AMBER (A) 04/16/2019 1642   APPEARANCEUR CLOUDY (A) 04/16/2019 1642   LABSPEC 1.019 04/16/2019 1642   PHURINE 5.0 04/16/2019 1642   GLUCOSEU NEGATIVE 04/16/2019 1642   HGBUR LARGE (A) 04/16/2019 1642   BILIRUBINUR NEGATIVE 04/16/2019 1642   BILIRUBINUR small 06/22/2016 1310   KETONESUR NEGATIVE 04/16/2019 1642   PROTEINUR 30 (A) 04/16/2019 1642   UROBILINOGEN 1.0 06/22/2016 1310   UROBILINOGEN 0.2 05/12/2012 0901   NITRITE NEGATIVE 04/16/2019 1642   LEUKOCYTESUR  MODERATE (A) 04/16/2019 1642    Lab Results  Component Value Date   LABMICR <3.0 03/31/2022   BACTERIA MANY (A) 04/16/2019    Pertinent Imaging:  No results found for this or any previous visit.  No results found for this or any previous visit.  No results found for this or any previous visit.  No results found for this or any previous visit.  No results found for this or any previous visit.  No results found for this or any previous visit.  No results found for this or any  previous visit.  Results for orders placed during the hospital encounter of 04/16/19  CT RENAL STONE STUDY  Narrative CLINICAL DATA:  Weakness fever body aches  EXAM: CT ABDOMEN AND PELVIS WITHOUT CONTRAST  TECHNIQUE: Multidetector CT imaging of the abdomen and pelvis was performed following the standard protocol without IV contrast.  COMPARISON:  August 12, 2015  FINDINGS: The study is limited due to patient motion.  Lower chest: The visualized heart size within normal limits. No pericardial fluid/thickening.  No hiatal hernia.  The visualized portions of the lungs are clear.  Hepatobiliary: Although limited due to the lack of intravenous contrast, normal in appearance without gross focal abnormality. No evidence of calcified gallstones or biliary ductal dilatation.  Pancreas:  Unremarkable.  No surrounding inflammatory changes.  Spleen: Normal in size. Although limited due to the lack of intravenous contrast, normal in appearance.  Adrenals/Urinary Tract: Both adrenal glands appear normal. There is perinephric stranding seen around both kidneys and proximal ureteral stranding. Again noted are bilateral partially at exophytic low-density lesions, the largest is off the lower pole of the right kidney measuring 4.4 cm. The bladder is partially decompressed with diffuse bladder wall thickening. No calculi are noted.  Stomach/Bowel: The stomach, small bowel, and colon are normal  in appearance. No inflammatory changes or obstructive findings. appendix is normal.  Vascular/Lymphatic: There are no enlarged abdominal or pelvic lymph nodes. No significant gross vascular findings are present.  Reproductive: The prostate is unremarkable.  Other: No evidence of abdominal wall mass or hernia.  Musculoskeletal: No acute or significant osseous findings.  IMPRESSION: 1. Bilateral perinephric and proximal periureteral stranding with diffuse mild bladder wall thickening. This could be due to bilateral pyelonephritis and chronic cystitis. 2. No renal calculi or hydronephrosis 3. Bilateral simple renal cysts.   Electronically Signed By: Prudencio Pair M.D. On: 04/17/2019 17:11   Assessment & Plan:    1. Elevated PSA -We will recheck PSA today. If it remains stable at 4.0, then I will see him back in 1 year with PSA. If it remains elevated we will proceed with prostate biopsy - Urinalysis, Routine w reflex microscopic   No follow-ups on file.  Nicolette Bang, MD  [facility] [place]

## 2022-04-15 LAB — PSA, TOTAL AND FREE
PSA, Free Pct: 11.6 %
PSA, Free: 0.71 ng/mL
Prostate Specific Ag, Serum: 6.1 ng/mL — ABNORMAL HIGH (ref 0.0–4.0)

## 2022-04-19 ENCOUNTER — Encounter: Payer: Self-pay | Admitting: Urology

## 2022-04-19 NOTE — Patient Instructions (Signed)

## 2022-05-19 ENCOUNTER — Telehealth: Payer: Self-pay

## 2022-05-19 NOTE — Telephone Encounter (Signed)
-----   Message from Cleon Gustin, MD sent at 05/19/2022  9:49 AM EDT ----- PSA stable ----- Message ----- From: Sherrilyn Rist, CMA Sent: 04/15/2022   8:11 AM EDT To: Cleon Gustin, MD  Please Review

## 2022-05-19 NOTE — Telephone Encounter (Signed)
Made patient aware that his PSA level were stable and patient voiced understanding.

## 2022-05-31 ENCOUNTER — Other Ambulatory Visit: Payer: Self-pay | Admitting: Family Medicine

## 2022-06-22 ENCOUNTER — Ambulatory Visit (INDEPENDENT_AMBULATORY_CARE_PROVIDER_SITE_OTHER): Payer: Medicare Other | Admitting: Internal Medicine

## 2022-06-22 ENCOUNTER — Encounter: Payer: Self-pay | Admitting: Internal Medicine

## 2022-06-22 VITALS — BP 153/88 | Ht 67.0 in | Wt 283.0 lb

## 2022-06-22 DIAGNOSIS — Z0000 Encounter for general adult medical examination without abnormal findings: Secondary | ICD-10-CM | POA: Diagnosis not present

## 2022-06-22 NOTE — Patient Instructions (Signed)
  Christopher Lambert , Thank you for taking time to come for your Medicare Wellness Visit. I appreciate your ongoing commitment to your health goals. Please review the following plan we discussed and let me know if I can assist you in the future.   These are the goals we discussed:  Goals      Blood Pressure < 140/90     Keep blood pressure under 120/80     DIET - REDUCE CALORIE INTAKE     Exercise 3x per week (30 min per time)     Increase exercise.     HEMOGLOBIN A1C < 7     A1c goal is 6 or less     Weight (lb) < 250 lb (113.4 kg)        This is a list of the screening recommended for you and due dates:  Health Maintenance  Topic Date Due   COVID-19 Vaccine (5 - Moderna series) 02/05/2022   Flu Shot  04/14/2022   Hemoglobin A1C  07/26/2022   Eye exam for diabetics  02/25/2023   Yearly kidney function blood test for diabetes  04/01/2023   Yearly kidney health urinalysis for diabetes  04/01/2023   Tetanus Vaccine  02/26/2025   Colon Cancer Screening  03/23/2028   Pneumonia Vaccine  Completed   Hepatitis C Screening: USPSTF Recommendation to screen - Ages 18-79 yo.  Completed   HIV Screening  Completed   Zoster (Shingles) Vaccine  Completed   HPV Vaccine  Aged Out   Complete foot exam   Discontinued

## 2022-06-22 NOTE — Progress Notes (Addendum)
Subjective:   Christopher Lambert is a 66 y.o. male who presents for Medicare Annual/Subsequent preventive examination.  Patient reports his nose has been running for 3 days. He takes Flonase for seasonal allergies and feels its not doing the job.No fever , malaise, sore throat , or sick contacts. He is going to pick up some over the counter Zyrtec . If his symptoms do not improve this week he will call to schedule an appointment.   Review of Systems    Review of Systems  HENT:  Negative for nosebleeds and sore throat.        Rhinorrhea  All other systems reviewed and are negative.    Objective:    Today's Vitals   06/22/22 1315  BP: (!) 153/88  Weight: 283 lb (128.4 kg)  Height: '5\' 7"'$  (1.702 m)   Body mass index is 44.32 kg/m.     03/05/2022   11:05 AM 06/18/2021    1:42 PM 11/14/2019    3:09 PM 11/04/2019    7:31 AM 04/16/2019    9:00 PM 04/16/2019    1:52 PM 03/23/2018    7:31 AM  Advanced Directives  Does Patient Have a Medical Advance Directive? No No No No No No No  Would patient like information on creating a medical advance directive?  No - Patient declined No - Patient declined  Yes (Inpatient - patient requests chaplain consult to create a medical advance directive)      Current Medications (verified) Outpatient Encounter Medications as of 06/22/2022  Medication Sig   allopurinol (ZYLOPRIM) 300 MG tablet TAKE 1 TABLET BY MOUTH EVERY DAY   aspirin 81 MG EC tablet TAKE 1 TABLET EVERY DAY (Patient taking differently: Take 81 mg by mouth daily.)   cholecalciferol (VITAMIN D) 1000 UNITS tablet Take 1,000 Units by mouth daily.   ELDERBERRY PO Take 1 tablet by mouth daily.    fluticasone (FLONASE) 50 MCG/ACT nasal spray PLACE 2 SPRAYS INTO BOTH NOSTRILS 2 (TWO) TIMES DAILY   furosemide (LASIX) 20 MG tablet TAKE 1 TABLET BY MOUTH EVERY DAY   metFORMIN (GLUCOPHAGE) 500 MG tablet Take 1 tablet (500 mg total) by mouth daily with breakfast.   Multiple Vitamin (MULTIVITAMIN WITH  MINERALS) TABS tablet Take 1 tablet by mouth daily.   NIFEdipine (PROCARDIA XL/NIFEDICAL-XL) 90 MG 24 hr tablet TAKE 1 TABLET BY MOUTH EVERY DAY   potassium chloride (KLOR-CON) 10 MEQ tablet TAKE 2 TABLETS BY MOUTH EVERY DAY   simvastatin (ZOCOR) 5 MG tablet Take 1 tablet (5 mg total) by mouth at bedtime.   No facility-administered encounter medications on file as of 06/22/2022.    Allergies (verified) Patient has no known allergies.   History: Past Medical History:  Diagnosis Date   Abnormal cardiovascular function study 09/21/2011   Arthritis of knee    Bilateral   Diabetes mellitus 06/2008   type 2 NIDDM x 7 yrs; off meds   Diabetes mellitus without complication ([place])    Phreesia 08/18/2020   ED (erectile dysfunction)    Essential hypertension, benign 1990   H/O: whooping cough    aschild   Hemorrhoids, internal    Hypertension    Phreesia 71/69/6789   Metabolic syndrome 3/81/0175   Obesity    Obstructive sleep apnea 11/09/2007   NPSG 08/26/04 AHI 92/hr, [place], [place] NPSG 01/31/15- severe OSA AHI 49.4/ hr, CPAP suggested 13, AHI 0.0, weight 294 lbs CPAP Advanced     Palpitations 08/24/2011   Piles (hemorrhoids) 06/19/2014  Seasonal allergies    Sleep apnea 2004   wears CPAP nightly     Past Surgical History:  Procedure Laterality Date   CARDIAC CATHETERIZATION  09/25/2011   CARPAL TUNNEL RELEASE     Right    COLONOSCOPY N/A 03/23/2018   Procedure: COLONOSCOPY;  Surgeon: Daneil Dolin, MD;  [facility];  Service: Endoscopy;  Laterality: N/A;  8:00   ENDOVENOUS ABLATION SAPHENOUS VEIN W/ LASER Right 10/10/2020   endovenous laser ablation right greater saphenous vein by Gae Gallop MD    HERNIA REPAIR  7824 approx   umbilical   JOINT REPLACEMENT  2011   right knee   JOINT REPLACEMENT  2013   left knee   left knee replacement   sept 2013   POLYPECTOMY  03/23/2018   Procedure: POLYPECTOMY;  Surgeon: Daneil Dolin, MD;  [facility];  Service:  Endoscopy;;  colon   Right knee replacement  06/2010   TOTAL KNEE ARTHROPLASTY  05/18/2012   Procedure: TOTAL KNEE ARTHROPLASTY;  Surgeon: Ninetta Lights, MD;  Location: [facility];  Service: Orthopedics;  Laterality: Left;  left total knee arthroplasty   UMBILICAL HERNIA REPAIR     Family History  Problem Relation Age of Onset   Coronary artery disease Brother 75       Premature   Hypertension Brother    Hypertension Father    Cancer Father 66       colon    Hypertension Mother    Diabetes Brother    Hypertension Sister    Hypertension Sister    Hypertension Sister    Hypertension Sister    Social History   Socioeconomic History   Marital status: Married    Spouse name: Civil engineer, contracting   Number of children: 0   Years of education: Not on file   Highest education level: Not on file  Occupational History    Employer: [employer]  Tobacco Use   Smoking status: Former    Packs/day: 0.50    Types: Cigarettes   Smokeless tobacco: Never  Vaping Use   Vaping Use: Never used  Substance and Sexual Activity   Alcohol use: No   Drug use: No   Sexual activity: Yes  Other Topics Concern   Not on file  Social History Narrative   Married x 36 yrs in 2022.   Social Determinants of Health   Financial Resource Strain: Low Risk  (06/18/2021)   Overall Financial Resource Strain (CARDIA)    Difficulty of Paying Living Expenses: Not hard at all  Food Insecurity: No Food Insecurity (06/18/2021)   Hunger Vital Sign    Worried About Running Out of Food in the Last Year: Never true    Ran Out of Food in the Last Year: Never true  Transportation Needs: No Transportation Needs (06/18/2021)   PRAPARE - Hydrologist (Medical): No    Lack of Transportation (Non-Medical): No  Physical Activity: Sufficiently Active (06/18/2021)   Exercise Vital Sign    Days of Exercise per Week: 5 days    Minutes of Exercise per Session: 30 min  Stress: No Stress Concern  Present (06/18/2021)   [place]    Feeling of Stress : Not at all  Social Connections: [place] (06/18/2021)   Social Connection and Isolation Panel [NHANES]    Frequency of Communication with Friends and Family: More than three times a  week    Frequency of Social Gatherings with Friends and Family: More than three times a week    Attends Religious Services: More than 4 times per year    Active Member of [employer] or Organizations: Yes    Attends Music therapist: More than 4 times per year    Marital Status: Married    Tobacco Counseling Counseling given: Not Answered   Clinical Intake:  Pre-visit preparation completed: Yes  Pain : No/denies pain     Nutritional Status: BMI > 30  Obese Diabetes: Yes CBG done?: No Did pt. bring in CBG monitor from home?: No  How often do you need to have someone help you when you read instructions, pamphlets, or other written materials from your doctor or pharmacy?: 1 - Never  Diabetic?No     Activities of Daily Living     No data to display          Patient Care Team: [name], MD as PCP - General Deneise Lever, MD as Consulting Physician (Pulmonary Disease)  Indicate any recent Medical Services you may have received from other than [facility] providers in the past year (date may be approximate).     Assessment:   This is a routine wellness examination for [place].  Hearing/Vision screen No results found.  Dietary issues and exercise activities discussed:     Goals Addressed   None    Depression Screen    03/18/2022    8:33 AM 09/25/2021    8:56 AM 06/18/2021    1:49 PM 06/18/2021    1:35 PM 03/20/2021    8:35 AM 06/11/2020   10:36 AM 03/11/2020    8:44 AM  PHQ 2/9 Scores  PHQ - 2 Score 0 0 0 0 0 0 0    Fall Risk    03/18/2022    8:33 AM 01/23/2022    9:33 AM 09/25/2021    8:56 AM 06/18/2021    1:44 PM 03/20/2021    8:35  AM  Fall Risk   Falls in the past year? 0 0 0 0 0  Number falls in past yr: 0 0 0 0 0  Injury with Fall? 0 0 0 0 0  Risk for fall due to : No Fall Risks  No Fall Risks Impaired vision No Fall Risks  Follow up Falls evaluation completed  Falls evaluation completed Falls prevention discussed Falls evaluation completed    FALL RISK PREVENTION PERTAINING TO THE HOME:  Any stairs in or around the home? Yes  If so, are there any without handrails? Yes  Home free of loose throw rugs in walkways, pet beds, electrical cords, etc? Yes  Adequate lighting in your home to reduce risk of falls? Yes   ASSISTIVE DEVICES UTILIZED TO PREVENT FALLS:  Life alert? No  Use of a cane, walker or w/c? No  Grab bars in the bathroom? Yes  Shower chair or bench in shower? Yes  Elevated toilet seat or a handicapped toilet? Yes   Cognitive Function:        06/11/2020   10:40 AM 05/30/2019    8:56 AM  6CIT Screen  What Year? 0 points 4 points  What month? 0 points 0 points  What time? 0 points 0 points  Count back from 20 0 points 0 points  Months in reverse 0 points 0 points  Repeat phrase 0 points 0 points  Total Score 0 points 4 points    Immunizations Immunization History  Administered Date(s) Administered   Influenza Split 08/24/2011, 05/30/2012   Influenza Whole 08/18/2005, 06/26/2010   Influenza,inj,Quad PF,6+ Mos 06/23/2013, 06/19/2014, 07/14/2016, 09/01/2017, 05/09/2018, 05/08/2019, 06/14/2020   Influenza-Unspecified 05/31/2015, 06/18/2021   Moderna Covid-19 Vaccine Bivalent Booster 50yr & up 10/08/2021   Moderna Sars-Covid-2 Vaccination 11/30/2019, 12/29/2019, 07/18/2020   PNEUMOCOCCAL CONJUGATE-20 03/18/2022   Pneumococcal Conjugate-13 10/23/2014   Pneumococcal Polysaccharide-23 02/16/2005, 05/20/2012   Td 02/16/2005   Tdap 02/27/2015   Zoster Recombinat (Shingrix) 10/08/2021, 01/23/2022    TDAP status: Up to date  Flu Vaccine status: Up to date  Pneumococcal vaccine status:  Up to date  Covid-19 vaccine status: Information provided on how to obtain vaccines.   Qualifies for Shingles Vaccine? No   Zostavax completed No   Shingrix Completed?: Yes  Screening Tests Health Maintenance  Topic Date Due   COVID-19 Vaccine (5 - Moderna series) 02/05/2022   INFLUENZA VACCINE  04/14/2022   HEMOGLOBIN A1C  07/26/2022   OPHTHALMOLOGY EXAM  02/25/2023   Diabetic kidney evaluation - GFR measurement  04/01/2023   Diabetic kidney evaluation - Urine ACR  04/01/2023   TETANUS/TDAP  02/26/2025   COLONOSCOPY (Pts 45-433yrInsurance coverage will need to be confirmed)  03/23/2028   Pneumonia Vaccine 6578Years old  Completed   Hepatitis C Screening  Completed   HIV Screening  Completed   Zoster Vaccines- Shingrix  Completed   HPV VACCINES  Aged Out   FOOT EXAM  Discontinued    Health Maintenance  Health Maintenance Due  Topic Date Due   COVID-19 Vaccine (5 - Moderna series) 02/05/2022   INFLUENZA VACCINE  04/14/2022    Colorectal cancer screening: Type of screening: Colonoscopy. Completed 03/23/2018. Repeat every 10 years  Lung Cancer Screening: (Low Dose CT Chest recommended if Age 65-80ears, 30 pack-year currently smoking OR have quit w/in 15years.) does not qualify.    Additional Screening:  Hepatitis C Screening: does qualify; Completed 07/01/2015  Vision Screening: Recommended annual ophthalmology exams for early detection of glaucoma and other disorders of the eye. Is the patient up to date with their annual eye exam?  Yes  Who is the provider or what is the name of the office in which the patient attends annual eye exams? Dr.Shiparo If pt is not established with a provider, would they like to be referred to a provider to establish care? No .   Dental Screening: Recommended annual dental exams for proper oral hygiene  Community Resource Referral / Chronic Care Management: CRR required this visit?  No   CCM required this visit?  No      Plan:      I have personally reviewed and noted the following in the patient's chart:   Medical and social history Use of alcohol, tobacco or illicit drugs  Current medications and supplements including opioid prescriptions. Patient is not currently taking opioid prescriptions. Functional ability and status Nutritional status Physical activity Advanced directives List of other physicians Hospitalizations, surgeries, and ER visits in previous 12 months Vitals Screenings to include cognitive, depression, and falls Referrals and appointments  In addition, I have reviewed and discussed with patient certain preventive protocols, quality metrics, and best practice recommendations. A written personalized care plan for preventive services as well as general preventive health recommendations were provided to patient.     JeLorene DyMD   06/22/2022

## 2022-06-23 ENCOUNTER — Encounter: Payer: Medicare Other | Admitting: Family Medicine

## 2022-06-23 ENCOUNTER — Telehealth: Payer: Medicare Other | Admitting: Internal Medicine

## 2022-07-01 ENCOUNTER — Other Ambulatory Visit: Payer: Self-pay | Admitting: Family Medicine

## 2022-07-16 LAB — BMP8+EGFR
BUN/Creatinine Ratio: 13 (ref 10–24)
BUN: 12 mg/dL (ref 8–27)
CO2: 23 mmol/L (ref 20–29)
Calcium: 8.9 mg/dL (ref 8.6–10.2)
Chloride: 101 mmol/L (ref 96–106)
Creatinine, Ser: 0.91 mg/dL (ref 0.76–1.27)
Glucose: 120 mg/dL — ABNORMAL HIGH (ref 70–99)
Potassium: 3.6 mmol/L (ref 3.5–5.2)
Sodium: 140 mmol/L (ref 134–144)
eGFR: 94 mL/min/{1.73_m2} (ref >59)

## 2022-07-16 LAB — HEMOGLOBIN A1C
Est. average glucose Bld gHb Est-mCnc: 140 mg/dL
Hgb A1c MFr Bld: 6.5 % — ABNORMAL HIGH (ref 4.8–5.6)

## 2022-07-21 ENCOUNTER — Encounter: Payer: Self-pay | Admitting: Family Medicine

## 2022-07-21 ENCOUNTER — Ambulatory Visit: Payer: Medicare Other | Admitting: Family Medicine

## 2022-07-21 VITALS — BP 136/80 | HR 77 | Ht 67.0 in | Wt 287.0 lb

## 2022-07-21 DIAGNOSIS — E559 Vitamin D deficiency, unspecified: Secondary | ICD-10-CM

## 2022-07-21 DIAGNOSIS — I10 Essential (primary) hypertension: Secondary | ICD-10-CM

## 2022-07-21 DIAGNOSIS — M79671 Pain in right foot: Secondary | ICD-10-CM

## 2022-07-21 DIAGNOSIS — E1169 Type 2 diabetes mellitus with other specified complication: Secondary | ICD-10-CM

## 2022-07-21 DIAGNOSIS — E785 Hyperlipidemia, unspecified: Secondary | ICD-10-CM

## 2022-07-21 DIAGNOSIS — H6191 Disorder of right external ear, unspecified: Secondary | ICD-10-CM | POA: Insufficient documentation

## 2022-07-21 DIAGNOSIS — G8929 Other chronic pain: Secondary | ICD-10-CM

## 2022-07-21 DIAGNOSIS — M25551 Pain in right hip: Secondary | ICD-10-CM | POA: Insufficient documentation

## 2022-07-21 DIAGNOSIS — Z23 Encounter for immunization: Secondary | ICD-10-CM

## 2022-07-21 MED ORDER — LOSARTAN POTASSIUM 25 MG PO TABS: ORAL_TABLET | ORAL | 3 refills | Status: DC

## 2022-07-21 NOTE — Patient Instructions (Addendum)
Annual exam January 13 or after call if you need me sooner.  Blood pressure reevaluation at that visit.  Flu vaccine today.  Fasting lipid CMP and EGFR TSH and vitamin D 5 days before follow-up visit in January.  New additional medicine for blood pressure and kidney protection is losartan 25 mg 1 daily.  Continue nifedipine as before. You do need the COVID-vaccine as well as the RSV vaccine.  You are referred to podiatry me right foot pain for the past 11 months.  Please get an x-ray of the right hip today.You may use tylenol arthritis for pain, 1 to 2 per day  You are referred to ENT really the lesion on your right earlobe  It is important that you exercise regularly at least 30 minutes 5 times a week. If you develop chest pain, have severe difficulty breathing, or feel very tired, stop exercising immediately and seek medical attention  Think about what you will eat, plan ahead. Choose " clean, green, fresh or frozen" over canned, processed or packaged foods which are more sugary, salty and fatty. 70 to 75% of food eaten should be vegetables and fruit. Three meals at set times with snacks allowed between meals, but they must be fruit or vegetables. Aim to eat over a 12 hour period , example 7 am to 7 pm, and STOP after  your last meal of the day. Drink water,generally about 64 ounces per day, no other drink is as healthy. Fruit juice is best enjoyed in a healthy way, by EATING the fruit.

## 2022-07-21 NOTE — Assessment & Plan Note (Signed)
  Patient re-educated about  the importance of commitment to a  minimum of 150 minutes of exercise per week as able.  The importance of healthy food choices with portion control discussed, as well as eating regularly and within a 12 hour window most days. The need to choose "clean , green" food 50 to 75% of the time is discussed, as well as to make water the primary drink and set a goal of 64 ounces water daily.       07/21/2022    8:48 AM 06/22/2022    1:15 PM 04/02/2022    3:38 PM  Weight /BMI  Weight 287 lb 283 lb 285 lb  Height '5\' 7"'$  (1.702 m) '5\' 7"'$  (1.702 m) 5' 7.5" (1.715 m)  BMI 44.95 kg/m2 44.32 kg/m2 43.98 kg/m2

## 2022-07-21 NOTE — Progress Notes (Signed)
Christopher Lambert     MRN: [id number]      DOB: 01/04/1956   HPI Christopher Lambert is here for follow up and re-evaluation of chronic medical conditions, medication management and review of any available recent lab and radiology data.  Preventive health is updated, specifically  Cancer screening and Immunization.   Questions or concerns regarding consultations or procedures which the PT has had in the interim are  addressed. The PT denies any adverse reactions to current medications since the last visit.  Right hip pain after astanding on concrete. Up to a 7 x 11 months, topical makes it tolerble Right heel pain daily,rated at from 4 to 6 Bump on right ear lobe for over 5 years does not think it is enlarging , and is not painful ROS Denies recent fever or chills. Denies sinus pressure, nasal congestion, ear pain or sore throat. Denies chest congestion, productive cough or wheezing. Denies chest pains, palpitations and leg swelling Denies abdominal pain, nausea, vomiting,diarrhea or constipation.   Denies dysuria, frequency, hesitancy or incontinence. . Denies headaches, seizures, numbness, or tingling. Denies depression, anxiety or insomnia. Denies skin break down or rash.   PE  BP 137/79 (BP Location: Left Arm, Patient Position: Sitting, Cuff Size: Large)   Pulse 77   Ht '5\' 7"'$  (1.702 m)   Wt 287 lb (130.2 kg)   SpO2 93%   BMI 44.95 kg/m   Patient alert and oriented and in no cardiopulmonary distress.  HEENT: No facial asymmetry, EOMI,     Neck supple .  Chest: Clear to auscultation bilaterally.  CVS: S1, S2 no murmurs, no S3.Regular rate.  ABD: Soft non tender.   Ext: No edema  MS: decreased  ROM spine, , hips and knees.  Skin: Intact, no ulcerations or rash noted.nodule on right earlobe approx 2.5 cm  Psych: Good eye contact, normal affect. Memory intact not anxious or depressed appearing.  CNS: CN 2-12 intact, power,  normal throughout.no focal deficits  noted.   Assessment & Plan  Essential hypertension, benign  Controlled, but not at goal, add losartan DASH diet and commitment to daily physical activity for a minimum of 30 minutes discussed and encouraged, as a part of hypertension management. The importance of attaining a healthy weight is also discussed.     07/21/2022    9:34 AM 07/21/2022    8:48 AM 06/22/2022    1:15 PM 04/14/2022    9:58 AM 04/02/2022    3:38 PM 03/18/2022    8:30 AM 03/05/2022    3:00 PM  BP/Weight  Systolic BP 510 258 527 782 423 536 144  Diastolic BP 80 79 88 77 87 73 68  Wt. (Lbs)  287 283  285 281.04   BMI  44.95 kg/m2 44.32 kg/m2  43.98 kg/m2 43.37 kg/m2        Morbid obesity (HCC)  Patient re-educated about  the importance of commitment to a  minimum of 150 minutes of exercise per week as able.  The importance of healthy food choices with portion control discussed, as well as eating regularly and within a 12 hour window most days. The need to choose "clean , green" food 50 to 75% of the time is discussed, as well as to make water the primary drink and set a goal of 64 ounces water daily.       07/21/2022    8:48 AM 06/22/2022    1:15 PM 04/02/2022    3:38 PM  Weight /BMI  Weight 287 lb 283 lb 285 lb  Height '5\' 7"'$  (1.702 m) '5\' 7"'$  (1.702 m) 5' 7.5" (1.715 m)  BMI 44.95 kg/m2 44.32 kg/m2 43.98 kg/m2      Dyslipidemia, goal LDL below 100 Hyperlipidemia:Low fat diet discussed and encouraged.   Lipid Panel  Lab Results  Component Value Date   CHOL 139 03/31/2022   HDL 42 03/31/2022   LDLCALC 75 03/31/2022   TRIG 122 03/31/2022   CHOLHDL 3.3 03/31/2022     Controlled, no change in medication   Earlobe lesion, right Present for over 5 years , refer ENT  Chronic right hip pain X ray to assess further

## 2022-07-21 NOTE — Assessment & Plan Note (Signed)
Present for over 5 years , refer ENT

## 2022-07-21 NOTE — Assessment & Plan Note (Addendum)
Controlled, but not at goal, add losartan DASH diet and commitment to daily physical activity for a minimum of 30 minutes discussed and encouraged, as a part of hypertension management. The importance of attaining a healthy weight is also discussed.     07/21/2022    9:34 AM 07/21/2022    8:48 AM 06/22/2022    1:15 PM 04/14/2022    9:58 AM 04/02/2022    3:38 PM 03/18/2022    8:30 AM 03/05/2022    3:00 PM  BP/Weight  Systolic BP 841 660 630 160 109 323 557  Diastolic BP 80 79 88 77 87 73 68  Wt. (Lbs)  287 283  285 281.04   BMI  44.95 kg/m2 44.32 kg/m2  43.98 kg/m2 43.37 kg/m2

## 2022-07-21 NOTE — Assessment & Plan Note (Signed)
Hyperlipidemia:Low fat diet discussed and encouraged.   Lipid Panel  Lab Results  Component Value Date   CHOL 139 03/31/2022   HDL 42 03/31/2022   LDLCALC 75 03/31/2022   TRIG 122 03/31/2022   CHOLHDL 3.3 03/31/2022     Controlled, no change in medication

## 2022-07-21 NOTE — Assessment & Plan Note (Signed)
X ray to assess further

## 2022-07-30 ENCOUNTER — Ambulatory Visit: Payer: Medicare Other | Admitting: Podiatry

## 2022-07-30 ENCOUNTER — Encounter: Payer: Self-pay | Admitting: Podiatry

## 2022-07-30 ENCOUNTER — Ambulatory Visit (INDEPENDENT_AMBULATORY_CARE_PROVIDER_SITE_OTHER): Payer: Medicare Other

## 2022-07-30 DIAGNOSIS — M722 Plantar fascial fibromatosis: Secondary | ICD-10-CM | POA: Diagnosis not present

## 2022-07-30 DIAGNOSIS — M778 Other enthesopathies, not elsewhere classified: Secondary | ICD-10-CM

## 2022-07-30 MED ORDER — TRIAMCINOLONE ACETONIDE 40 MG/ML IJ SUSP
20.0000 mg | Freq: Once | INTRAMUSCULAR | Status: AC
  Administered 2022-07-30: 20 mg

## 2022-07-30 MED ORDER — MELOXICAM 15 MG PO TABS: 15.0000 mg | ORAL_TABLET | Freq: Every day | ORAL | 3 refills | Status: DC

## 2022-07-30 NOTE — Patient Instructions (Signed)

## 2022-07-30 NOTE — Progress Notes (Signed)
Subjective:  Patient ID: Christopher Lambert, male    DOB: [birth year],  MRN: [id number] HPI Chief Complaint  Patient presents with   Foot Pain    Plantar heel right - aching x 1 month, AM pain, PCP eval-said to try some Tylenol   New Patient (Initial Visit)    Est pt 09/2018      66 y.o. male presents with the above complaint.   ROS: Denies fever chills nausea mobic muscle aches pains calf pain back pain chest pain shortness of breath.  Past Medical History:  Diagnosis Date   Abnormal cardiovascular function study 09/21/2011   Arthritis of knee    Bilateral   Diabetes mellitus 06/2008   type 2 NIDDM x 7 yrs; off meds   Diabetes mellitus without complication ([place])    Phreesia 08/18/2020   ED (erectile dysfunction)    Essential hypertension, benign 1990   H/O: whooping cough    [facility]   Hemorrhoids, internal    Hypertension    Phreesia 47/82/9562   Metabolic syndrome 10/14/8655   Obesity    Obstructive sleep apnea 11/09/2007   NPSG 08/26/04 AHI 92/hr, [place], [place] NPSG 01/31/15- severe OSA AHI 49.4/ hr, CPAP suggested 13, AHI 0.0, weight 294 lbs CPAP Advanced     Palpitations 08/24/2011   Piles (hemorrhoids) 06/19/2014   Seasonal allergies    Sleep apnea 2004   wears CPAP nightly     Past Surgical History:  Procedure Laterality Date   CARDIAC CATHETERIZATION  09/25/2011   CARPAL TUNNEL RELEASE     Right    COLONOSCOPY N/A 03/23/2018   Procedure: COLONOSCOPY;  Surgeon: Daneil Dolin, MD;  [facility]: [facility];  Service: Endoscopy;  Laterality: N/A;  8:00   ENDOVENOUS ABLATION SAPHENOUS VEIN W/ LASER Right 10/10/2020   endovenous laser ablation right greater saphenous vein by Gae Gallop MD    HERNIA REPAIR  8469 approx   umbilical   JOINT REPLACEMENT  2011   right knee   JOINT REPLACEMENT  2013   left knee   left knee replacement   sept 2013   POLYPECTOMY  03/23/2018   Procedure: POLYPECTOMY;  Surgeon: Daneil Dolin, MD;  Location: [facility];  Service: Endoscopy;;   colon   Right knee replacement  06/2010   TOTAL KNEE ARTHROPLASTY  05/18/2012   Procedure: TOTAL KNEE ARTHROPLASTY;  Surgeon: Ninetta Lights, MD;  Location: [facility];  Service: Orthopedics;  Laterality: Left;  left total knee arthroplasty   UMBILICAL HERNIA REPAIR      Current Outpatient Medications:    meloxicam (MOBIC) 15 MG tablet, Take 1 tablet (15 mg total) by mouth daily., Disp: 30 tablet, Rfl: 3   allopurinol (ZYLOPRIM) 300 MG tablet, TAKE 1 TABLET BY MOUTH EVERY DAY, Disp: 90 tablet, Rfl: 1   aspirin 81 MG EC tablet, TAKE 1 TABLET EVERY DAY (Patient taking differently: Take 81 mg by mouth daily.), Disp: 150 tablet, Rfl: 3   cholecalciferol (VITAMIN D) 1000 UNITS tablet, Take 1,000 Units by mouth daily., Disp: , Rfl:    ELDERBERRY PO, Take 1 tablet by mouth daily. , Disp: , Rfl:    fluticasone (FLONASE) 50 MCG/ACT nasal spray, PLACE 2 SPRAYS INTO BOTH NOSTRILS 2 (TWO) TIMES DAILY, Disp: 48 mL, Rfl: 1   furosemide (LASIX) 20 MG tablet, TAKE 1 TABLET BY MOUTH EVERY DAY, Disp: 90 tablet, Rfl: 2   losartan (COZAAR) 25 MG tablet, Take one tablet by mouth once daily, Disp: 30 tablet, Rfl:  3   metFORMIN (GLUCOPHAGE) 500 MG tablet, Take 1 tablet (500 mg total) by mouth daily with breakfast., Disp: 90 tablet, Rfl: 3   Multiple Vitamin (MULTIVITAMIN WITH MINERALS) TABS tablet, Take 1 tablet by mouth daily., Disp: , Rfl:    NIFEdipine (PROCARDIA XL/NIFEDICAL-XL) 90 MG 24 hr tablet, TAKE 1 TABLET BY MOUTH EVERY DAY, Disp: 90 tablet, Rfl: 1   potassium chloride (KLOR-CON) 10 MEQ tablet, TAKE 2 TABLETS BY MOUTH EVERY DAY, Disp: 180 tablet, Rfl: 0   simvastatin (ZOCOR) 5 MG tablet, TAKE 1 TABLET BY MOUTH EVERYDAY AT BEDTIME, Disp: 90 tablet, Rfl: 3  No Known Allergies Review of Systems Objective:  There were no vitals filed for this visit.  General: Well developed, nourished, in no acute distress, alert and oriented x3   Dermatological: Skin is warm, dry and supple bilateral. Nails x 10 are well  maintained; remaining integument appears unremarkable at this time. There are no open sores, no preulcerative lesions, no rash or signs of infection present.  Vascular: Dorsalis Pedis artery and Posterior Tibial artery pedal pulses are 2/4 bilateral with immedate capillary fill time. Pedal hair growth present. No varicosities and no lower extremity edema present bilateral.   Neruologic: Grossly intact via light touch bilateral. Vibratory intact via tuning fork bilateral. Protective threshold with Semmes Wienstein monofilament intact to all pedal sites bilateral. Patellar and Achilles deep tendon reflexes 2+ bilateral. No Babinski or clonus noted bilateral.   Musculoskeletal: No gross boney pedal deformities bilateral. No pain, crepitus, or limitation noted with foot and ankle range of motion bilateral. Muscular strength 5/5 in all groups tested bilateral.  Pain on Patient medial calcaneal tubercle bilateral.  Pes planovalgus bilateral  Gait: Unassisted, Nonantalgic.    Radiographs:  Radiographs taken today demonstrate an osseously mature individual with pes planovalgus.  Right foot.  Some early osteoarthritic changes of the tarsometatarsal joints of the right foot.  Otherwise he has a plantar distally oriented calcaneal spur with soft tissue increase in density at the plantar fascial Caney insertion site.  Assessment & Plan:   Assessment: Planter fasciitis Right knee.  Pes planovalgus bilateral.  Plan: Discussed etiology pathology conservative versus surgical therapies at this point injected the right heel today 20 mg Kenalog 5 mg Marcaine to the point maximal tenderness.  Tolerated procedure well without complications.  Started him on meloxicam.  Did not place him on a Medrol Dosepak secondary to diabetes being well controlled.  Placed him in a plantar fascia brace.  Discussed appropriate shoe gear stretching exercise ice therapy and sugar modifications.  Patient     Manu Rubey T. [place], [place]

## 2022-09-01 ENCOUNTER — Ambulatory Visit: Payer: Medicare Other | Admitting: Podiatry

## 2022-09-05 ENCOUNTER — Other Ambulatory Visit: Payer: Self-pay | Admitting: Family Medicine

## 2022-09-30 ENCOUNTER — Ambulatory Visit (INDEPENDENT_AMBULATORY_CARE_PROVIDER_SITE_OTHER): Payer: Medicare Other | Admitting: Family Medicine

## 2022-09-30 ENCOUNTER — Encounter: Payer: Self-pay | Admitting: Family Medicine

## 2022-09-30 VITALS — BP 128/82 | HR 78 | Resp 16 | Ht 67.5 in | Wt 271.0 lb

## 2022-09-30 DIAGNOSIS — E785 Hyperlipidemia, unspecified: Secondary | ICD-10-CM

## 2022-09-30 DIAGNOSIS — Z0001 Encounter for general adult medical examination with abnormal findings: Secondary | ICD-10-CM

## 2022-09-30 DIAGNOSIS — I10 Essential (primary) hypertension: Secondary | ICD-10-CM | POA: Diagnosis not present

## 2022-09-30 DIAGNOSIS — E559 Vitamin D deficiency, unspecified: Secondary | ICD-10-CM | POA: Diagnosis not present

## 2022-09-30 DIAGNOSIS — R972 Elevated prostate specific antigen [PSA]: Secondary | ICD-10-CM

## 2022-09-30 DIAGNOSIS — E1169 Type 2 diabetes mellitus with other specified complication: Secondary | ICD-10-CM

## 2022-09-30 NOTE — Patient Instructions (Addendum)
F/U in 4 months, call if you need me sooner  Labs asap lipid, cmp and eGFR, HBA1C, cBC, TSH, vit D, PSA total and free  Keep up healthy eating  It is important that you exercise regularly at least 30 minutes 5 times a week. If you develop chest pain, have severe difficulty breathing, or feel very tired, stop exercising immediately and seek medical attention   Thanks for choosing [facility], we consider it a privelige to serve you.

## 2022-10-01 ENCOUNTER — Encounter: Payer: Self-pay | Admitting: Family Medicine

## 2022-10-01 DIAGNOSIS — Z0001 Encounter for general adult medical examination with abnormal findings: Secondary | ICD-10-CM | POA: Insufficient documentation

## 2022-10-01 DIAGNOSIS — E559 Vitamin D deficiency, unspecified: Secondary | ICD-10-CM | POA: Insufficient documentation

## 2022-10-01 LAB — CMP14+EGFR
ALT: 26 IU/L (ref 0–44)
AST: 19 IU/L (ref 0–40)
Albumin/Globulin Ratio: 1.6 (ref 1.2–2.2)
Albumin: 4.4 g/dL (ref 3.9–4.9)
Alkaline Phosphatase: 46 IU/L (ref 44–121)
BUN/Creatinine Ratio: 9 — ABNORMAL LOW (ref 10–24)
BUN: 8 mg/dL (ref 8–27)
Bilirubin Total: 0.9 mg/dL (ref 0.0–1.2)
CO2: 24 mmol/L (ref 20–29)
Calcium: 9 mg/dL (ref 8.6–10.2)
Chloride: 102 mmol/L (ref 96–106)
Creatinine, Ser: 0.86 mg/dL (ref 0.76–1.27)
Globulin, Total: 2.8 g/dL (ref 1.5–4.5)
Glucose: 100 mg/dL — ABNORMAL HIGH (ref 70–99)
Potassium: 3.8 mmol/L (ref 3.5–5.2)
Sodium: 141 mmol/L (ref 134–144)
Total Protein: 7.2 g/dL (ref 6.0–8.5)
eGFR: 96 mL/min/{1.73_m2} (ref >59)

## 2022-10-01 LAB — CBC
Hematocrit: 46.6 % (ref 37.5–51.0)
Hemoglobin: 16.1 g/dL (ref 13.0–17.7)
MCH: 31.3 pg (ref 26.6–33.0)
MCHC: 34.5 g/dL (ref 31.5–35.7)
MCV: 91 fL (ref 79–97)
Platelets: 219 10*3/uL (ref 150–450)
RBC: 5.15 x10E6/uL (ref 4.14–5.80)
RDW: 13.2 % (ref 11.6–15.4)
WBC: 4.3 10*3/uL (ref 3.4–10.8)

## 2022-10-01 LAB — LIPID PANEL
Chol/HDL Ratio: 3.4 ratio (ref 0.0–5.0)
Cholesterol, Total: 158 mg/dL (ref 100–199)
HDL: 47 mg/dL (ref >39)
LDL Chol Calc (NIH): 91 mg/dL (ref 0–99)
Triglycerides: 108 mg/dL (ref 0–149)
VLDL Cholesterol Cal: 20 mg/dL (ref 5–40)

## 2022-10-01 LAB — TSH: TSH: 2.47 u[IU]/mL (ref 0.450–4.500)

## 2022-10-01 LAB — HEMOGLOBIN A1C
Est. average glucose Bld gHb Est-mCnc: 137 mg/dL
Hgb A1c MFr Bld: 6.4 % — ABNORMAL HIGH (ref 4.8–5.6)

## 2022-10-01 LAB — PSA, TOTAL AND FREE
PSA, Free Pct: 13 %
PSA, Free: 0.78 ng/mL
Prostate Specific Ag, Serum: 6 ng/mL — ABNORMAL HIGH (ref 0.0–4.0)

## 2022-10-01 LAB — VITAMIN D 25 HYDROXY (VIT D DEFICIENCY, FRACTURES): Vit D, 25-Hydroxy: 35.5 ng/mL (ref 30.0–100.0)

## 2022-10-01 NOTE — Assessment & Plan Note (Signed)
Christopher Lambert is reminded of the importance of commitment to daily physical activity for 30 minutes or more, as able and the need to limit carbohydrate intake to 30 to 60 grams per meal to help with blood sugar control.   The need to take medication as prescribed, test blood sugar as directed, and to call between visits if there is a concern that blood sugar is uncontrolled is also discussed.   Christopher Lambert is reminded of the importance of daily foot exam, annual eye examination, and good blood sugar, blood pressure and cholesterol control.     Latest Ref Rng & Units 07/15/2022    9:09 AM 03/31/2022    8:42 AM 03/05/2022   11:36 AM 01/23/2022   10:43 AM 09/23/2021    9:28 AM  Diabetic Labs  HbA1c 4.8 - 5.6 % 6.5    6.5  6.6   Micro/Creat Ratio 0 - 29 mg/g creat  <3      Chol 100 - 199 mg/dL  139      HDL >39 mg/dL  42      Calc LDL 0 - 99 mg/dL  75      Triglycerides 0 - 149 mg/dL  122      Creatinine 0.76 - 1.27 mg/dL 0.91  1.03  1.08  1.08  1.04       09/30/2022    9:15 AM 09/30/2022    8:31 AM 07/21/2022    9:34 AM 07/21/2022    8:48 AM 06/22/2022    1:15 PM 04/14/2022    9:58 AM 04/02/2022    3:38 PM  BP/Weight  Systolic BP 488 891 694 503 888 280 034  Diastolic BP 82 80 80 79 88 77 87  Wt. (Lbs)  271  287 283  285  BMI  41.82 kg/m2  44.95 kg/m2 44.32 kg/m2  43.98 kg/m2      Latest Ref Rng & Units 02/24/2021   12:00 AM 02/22/2020   12:00 AM  Foot/eye exam completion dates  Eye Exam No Retinopathy No Retinopathy     No Retinopathy         This result is from an external source.      Updated lab needed

## 2022-10-01 NOTE — Assessment & Plan Note (Signed)

## 2022-10-01 NOTE — Assessment & Plan Note (Signed)
Controlled, no change in medication DASH diet and commitment to daily physical activity for a minimum of 30 minutes discussed and encouraged, as a part of hypertension management. The importance of attaining a healthy weight is also discussed.     09/30/2022    9:15 AM 09/30/2022    8:31 AM 07/21/2022    9:34 AM 07/21/2022    8:48 AM 06/22/2022    1:15 PM 04/14/2022    9:58 AM 04/02/2022    3:38 PM  BP/Weight  Systolic BP 080 223 361 224 497 530 051  Diastolic BP 82 80 80 79 88 77 87  Wt. (Lbs)  271  287 283  285  BMI  41.82 kg/m2  44.95 kg/m2 44.32 kg/m2  43.98 kg/m2

## 2022-10-01 NOTE — Assessment & Plan Note (Signed)
Hyperlipidemia:Low fat diet discussed and encouraged.   Lipid Panel  Lab Results  Component Value Date   CHOL 139 03/31/2022   HDL 42 03/31/2022   LDLCALC 75 03/31/2022   TRIG 122 03/31/2022   CHOLHDL 3.3 03/31/2022     Updated lab on day of visit

## 2022-10-01 NOTE — Assessment & Plan Note (Signed)
Updated lab on day of visit.

## 2022-10-01 NOTE — Assessment & Plan Note (Signed)
Update lab will send result to Urology

## 2022-10-01 NOTE — Assessment & Plan Note (Signed)
Improved greatly  Patient re-educated about  the importance of commitment to a  minimum of 150 minutes of exercise per week as able.  The importance of healthy food choices with portion control discussed, as well as eating regularly and within a 12 hour window most days. The need to choose "clean , green" food 50 to 75% of the time is discussed, as well as to make water the primary drink and set a goal of 64 ounces water daily.       09/30/2022    8:31 AM 07/21/2022    8:48 AM 06/22/2022    1:15 PM  Weight /BMI  Weight 271 lb 287 lb 283 lb  Height 5' 7.5" (1.715 m) '5\' 7"'$  (1.702 m) '5\' 7"'$  (1.702 m)  BMI 41.82 kg/m2 44.95 kg/m2 44.32 kg/m2

## 2022-10-01 NOTE — Progress Notes (Signed)
Christopher Lambert     MRN: [id number]      DOB: 06-Sep-1956   HPI: Patient is in for annual physical exam. No other health concerns are expressed or addressed at the visit. Recent labs, if available are reviewed. Immunization is reviewed , and  updated if needed.    PE; BP 128/82   Pulse 78   Resp 16   Ht 5' 7.5" (1.715 m)   Wt 271 lb (122.9 kg)   SpO2 96%   BMI 41.82 kg/m   Pleasant male, alert and oriented x 3, in no cardio-pulmonary distress. Afebrile. HEENT No facial trauma or asymetry. Sinuses non tender. EOMI External ears normal,  Neck: supple, no adenopathy,JVD or thyromegaly.No bruits.  Chest: Clear to ascultation bilaterally.No crackles or wheezes. Non tender to palpation  Cardiovascular system; Heart sounds normal,  S1 and  S2 ,no S3.  No murmur, Peripheral pulses normal.  Abdomen: Soft, non tender, No organomegaly or mass     Musculoskeletal exam: Full ROM of spine, hips , shoulders and deceased in knees.  deformity ,swelling and crepitus noted. No muscle wasting or atrophy.   Neurologic: Cranial nerves 2 to 12 intact. Power, tone ,sensation  normal throughout. No disturbance in gait. No tremor.  Skin: Intact, no ulceration, erythema , scaling or rash noted. Pigmentation normal throughout  Psych; Normal mood and affect. Judgement and concentration normal   Assessment & Plan:  Encounter for Medicare annual examination with abnormal findings Annual exam as documented. Counseling done  re healthy lifestyle involving commitment to 150 minutes exercise per week, heart healthy diet, and attaining healthy weight.The importance of adequate sleep also discussed. Regular seat belt use and home safety, is also discussed. Changes in health habits are decided on by the patient with goals and time frames  set for achieving them. Immunization and cancer screening needs are specifically addressed at this visit.   DM type 2 with diabetic dyslipidemia  [facility]) [name] is reminded of the importance of commitment to daily physical activity for 30 minutes or more, as able and the need to limit carbohydrate intake to 30 to 60 grams per meal to help with blood sugar control.   The need to take medication as prescribed, test blood sugar as directed, and to call between visits if there is a concern that blood sugar is uncontrolled is also discussed.   [name] is reminded of the importance of daily foot exam, annual eye examination, and good blood sugar, blood pressure and cholesterol control.     Latest Ref Rng & Units 07/15/2022    9:09 AM 03/31/2022    8:42 AM 03/05/2022   11:36 AM 01/23/2022   10:43 AM 09/23/2021    9:28 AM  Diabetic Labs  HbA1c 4.8 - 5.6 % 6.5    6.5  6.6   Micro/Creat Ratio 0 - 29 mg/g creat  <3      Chol 100 - 199 mg/dL  139      HDL >39 mg/dL  42      Calc LDL 0 - 99 mg/dL  75      Triglycerides 0 - 149 mg/dL  122      Creatinine 0.76 - 1.27 mg/dL 0.91  1.03  1.08  1.08  1.04       09/30/2022    9:15 AM 09/30/2022    8:31 AM 07/21/2022    9:34 AM 07/21/2022    8:48 AM 06/22/2022    1:15 PM 04/14/2022  9:58 AM 04/02/2022    3:38 PM  BP/Weight  Systolic BP 518 841 660 630 160 109 323  Diastolic BP 82 80 80 79 88 77 87  Wt. (Lbs)  271  287 283  285  BMI  41.82 kg/m2  44.95 kg/m2 44.32 kg/m2  43.98 kg/m2      Latest Ref Rng & Units 02/24/2021   12:00 AM 02/22/2020   12:00 AM  Foot/eye exam completion dates  Eye Exam No Retinopathy No Retinopathy     No Retinopathy         This result is from an external source.      Updated lab needed  Essential hypertension, benign Controlled, no change in medication DASH diet and commitment to daily physical activity for a minimum of 30 minutes discussed and encouraged, as a part of hypertension management. The importance of attaining a healthy weight is also discussed.     09/30/2022    9:15 AM 09/30/2022    8:31 AM 07/21/2022    9:34 AM 07/21/2022    8:48 AM 06/22/2022     1:15 PM 04/14/2022    9:58 AM 04/02/2022    3:38 PM  BP/Weight  Systolic BP 557 322 025 427 062 376 283  Diastolic BP 82 80 80 79 88 77 87  Wt. (Lbs)  271  287 283  285  BMI  41.82 kg/m2  44.95 kg/m2 44.32 kg/m2  43.98 kg/m2       Morbid obesity (HCC) Improved greatly  Patient re-educated about  the importance of commitment to a  minimum of 150 minutes of exercise per week as able.  The importance of healthy food choices with portion control discussed, as well as eating regularly and within a 12 hour window most days. The need to choose "clean , green" food 50 to 75% of the time is discussed, as well as to make water the primary drink and set a goal of 64 ounces water daily.       09/30/2022    8:31 AM 07/21/2022    8:48 AM 06/22/2022    1:15 PM  Weight /BMI  Weight 271 lb 287 lb 283 lb  Height 5' 7.5" (1.715 m) '5\' 7"'$  (1.702 m) '5\' 7"'$  (1.702 m)  BMI 41.82 kg/m2 44.95 kg/m2 44.32 kg/m2      Dyslipidemia, goal LDL below 100 Hyperlipidemia:Low fat diet discussed and encouraged.   Lipid Panel  Lab Results  Component Value Date   CHOL 139 03/31/2022   HDL 42 03/31/2022   LDLCALC 75 03/31/2022   TRIG 122 03/31/2022   CHOLHDL 3.3 03/31/2022     Updated lab on day of visit  Elevated PSA Update lab will send result to Urology  Vitamin D deficiency Updated lab on day of visit.

## 2022-10-01 NOTE — Addendum Note (Signed)
Addended by: [name] on: 10/01/2022 08:15 AM   Modules accepted: Orders

## 2022-10-02 ENCOUNTER — Ambulatory Visit: Payer: Medicare Other | Admitting: Urology

## 2022-10-06 ENCOUNTER — Ambulatory Visit (INDEPENDENT_AMBULATORY_CARE_PROVIDER_SITE_OTHER): Payer: Medicare Other | Admitting: Podiatry

## 2022-10-06 ENCOUNTER — Encounter: Payer: Self-pay | Admitting: Podiatry

## 2022-10-06 VITALS — BP 142/89

## 2022-10-06 DIAGNOSIS — L84 Corns and callosities: Secondary | ICD-10-CM

## 2022-10-06 DIAGNOSIS — M79674 Pain in right toe(s): Secondary | ICD-10-CM | POA: Diagnosis not present

## 2022-10-06 DIAGNOSIS — M79675 Pain in left toe(s): Secondary | ICD-10-CM | POA: Diagnosis not present

## 2022-10-06 DIAGNOSIS — B351 Tinea unguium: Secondary | ICD-10-CM | POA: Diagnosis not present

## 2022-10-06 NOTE — Progress Notes (Unsigned)
  Subjective:  Patient ID: Christopher Lambert, male    DOB: [birth year],  MRN: [id number]  [name] presents to clinic today for {jgcomplaint:23593}  Chief Complaint  Patient presents with   Nail Problem    [facility] BS-do not check  A1C-do not know PCP-Margaret [name] PCP VST-09/30/2021    New problem(s): None. {jgcomplaint:23593}  PCP is [name], MD.  No Known Allergies  Review of Systems: Negative except as noted in the HPI.  Objective: No changes noted in today's physical examination. Vitals:   10/06/22 0845  BP: (!) 142/89   EXAVIOR KIMMONS is a pleasant 67 y.o. male {jgbodyhabitus:24098} AAO x 3.  Assessment/Plan: No diagnosis found.  No orders of the defined types were placed in this encounter.   None {Jgplan:23602::"-Patient/POA to call should there be question/concern in the interim."}   Return in about 3 months (around 01/05/2023).  Marzetta Board, DPM

## 2022-10-14 ENCOUNTER — Ambulatory Visit (INDEPENDENT_AMBULATORY_CARE_PROVIDER_SITE_OTHER): Payer: Medicare Other | Admitting: Urology

## 2022-10-14 ENCOUNTER — Encounter: Payer: Self-pay | Admitting: Urology

## 2022-10-14 VITALS — BP 144/82 | HR 88

## 2022-10-14 DIAGNOSIS — R972 Elevated prostate specific antigen [PSA]: Secondary | ICD-10-CM | POA: Diagnosis not present

## 2022-10-14 LAB — URINALYSIS, ROUTINE W REFLEX MICROSCOPIC
Bilirubin, UA: NEGATIVE
Glucose, UA: NEGATIVE
Ketones, UA: NEGATIVE
Leukocytes,UA: NEGATIVE
Nitrite, UA: NEGATIVE
Protein,UA: NEGATIVE
Specific Gravity, UA: 1.02 (ref 1.005–1.030)
Urobilinogen, Ur: 0.2 mg/dL (ref 0.2–1.0)
pH, UA: 5.5 (ref 5.0–7.5)

## 2022-10-14 LAB — MICROSCOPIC EXAMINATION: Bacteria, UA: NONE SEEN

## 2022-10-14 MED ORDER — LEVOFLOXACIN 750 MG PO TABS: 750.0000 mg | ORAL_TABLET | Freq: Once | ORAL | 0 refills | Status: AC

## 2022-10-14 NOTE — Progress Notes (Signed)
10/14/2022 3:20 PM   Christopher Lambert 1956-08-17 [id number]  Referring provider: [name], MD [address] [place],  [place] [address]  Elevated PSA   HPI: [name] is a 67yo here for followup for elevated PSA. PSA is 6.0. PSA was 6.1 six months ago. He denies any worsening LUTS. IPSS 5 QOL 0. NO other complaints today   PMH: Past Medical History:  Diagnosis Date   Abnormal cardiovascular function study 09/21/2011   Arthritis of knee    Bilateral   Diabetes mellitus 06/2008   type 2 NIDDM x 7 yrs; off meds   Diabetes mellitus without complication ([place])    Phreesia 08/18/2020   ED (erectile dysfunction)    Essential hypertension, benign 1990   H/O: whooping cough    aschild   Hemorrhoids, internal    Hypertension    Phreesia 34/28/7681   Metabolic syndrome 1/57/2620   Obesity    Obstructive sleep apnea 11/09/2007   NPSG 08/26/04 AHI 92/hr, [place], [place] NPSG 01/31/15- severe OSA AHI 49.4/ hr, CPAP suggested 13, AHI 0.0, weight 294 lbs CPAP Advanced     Palpitations 08/24/2011   Piles (hemorrhoids) 06/19/2014   Seasonal allergies    Sleep apnea 2004   wears CPAP nightly      Surgical History: Past Surgical History:  Procedure Laterality Date   CARDIAC CATHETERIZATION  09/25/2011   CARPAL TUNNEL RELEASE     Right    COLONOSCOPY N/A 03/23/2018   Procedure: COLONOSCOPY;  Surgeon: Daneil Dolin, MD;  [facility];  Service: Endoscopy;  Laterality: N/A;  8:00   ENDOVENOUS ABLATION SAPHENOUS VEIN W/ LASER Right 10/10/2020   endovenous laser ablation right greater saphenous vein by Gae Gallop MD    HERNIA REPAIR  [id number] approx   umbilical   JOINT REPLACEMENT  2011   right knee   JOINT REPLACEMENT  2013   left knee   left knee replacement   sept 2013   POLYPECTOMY  03/23/2018   Procedure: POLYPECTOMY;  Surgeon: Daneil Dolin, MD;  [facility];  Service: Endoscopy;;  colon   Right knee replacement  06/2010   TOTAL KNEE  ARTHROPLASTY  05/18/2012   Procedure: TOTAL KNEE ARTHROPLASTY;  Surgeon: Ninetta Lights, MD;  Location: [facility];  Service: Orthopedics;  Laterality: Left;  left total knee arthroplasty   UMBILICAL HERNIA REPAIR      Home Medications:  Allergies as of 10/14/2022   No Known Allergies      Medication List        Accurate as of October 14, 2022  3:20 PM. If you have any questions, ask your nurse or doctor.          allopurinol 300 MG tablet Commonly known as: ZYLOPRIM TAKE 1 TABLET BY MOUTH EVERY DAY   aspirin EC 81 MG tablet TAKE 1 TABLET EVERY DAY   cholecalciferol 1000 units tablet Commonly known as: VITAMIN D Take 1,000 Units by mouth daily.   ELDERBERRY PO Take 1 tablet by mouth daily.   fluticasone 50 MCG/ACT nasal spray Commonly known as: FLONASE PLACE 2 SPRAYS INTO BOTH NOSTRILS 2 (TWO) TIMES DAILY   furosemide 20 MG tablet Commonly known as: LASIX TAKE 1 TABLET BY MOUTH EVERY DAY   losartan 25 MG tablet Commonly known as: COZAAR Take one tablet by mouth once daily   meloxicam 15 MG tablet Commonly known as: MOBIC Take 1 tablet (15 mg total) by mouth daily.  metFORMIN 500 MG tablet Commonly known as: GLUCOPHAGE Take 1 tablet (500 mg total) by mouth daily with breakfast.   multivitamin with minerals Tabs tablet Take 1 tablet by mouth daily.   NIFEdipine 90 MG 24 hr tablet Commonly known as: PROCARDIA XL/NIFEDICAL-XL TAKE 1 TABLET BY MOUTH EVERY DAY   potassium chloride 10 MEQ tablet Commonly known as: KLOR-CON TAKE 2 TABLETS BY MOUTH EVERY DAY   simvastatin 5 MG tablet Commonly known as: ZOCOR TAKE 1 TABLET BY MOUTH EVERYDAY AT BEDTIME        Allergies: No Known Allergies  Family History: Family History  Problem Relation Age of Onset   Coronary artery disease Brother 2       Premature   Hypertension Brother    Hypertension Father    Cancer Father 85       colon    Hypertension Mother    Diabetes Brother    Hypertension Sister     Hypertension Sister    Hypertension Sister    Hypertension Sister     Social History:  reports that he has quit smoking. His smoking use included cigarettes. He smoked an average of .5 packs per day. He has never used smokeless tobacco. He reports that he does not drink alcohol and does not use drugs.  ROS: All other review of systems were reviewed and are negative except what is noted above in HPI  Physical Exam: BP (!) 144/82   Pulse 88   Constitutional:  Alert and oriented, No acute distress. HEENT: [place] AT, moist mucus membranes.  Trachea midline, no masses. Cardiovascular: No clubbing, cyanosis, or edema. Respiratory: Normal respiratory effort, no increased work of breathing. GI: Abdomen is soft, nontender, nondistended, no abdominal masses GU: No CVA tenderness.  Lymph: No cervical or inguinal lymphadenopathy. Skin: No rashes, bruises or suspicious lesions. Neurologic: Grossly intact, no focal deficits, moving all 4 extremities. Psychiatric: Normal mood and affect.  Laboratory Data: Lab Results  Component Value Date   WBC 4.3 09/30/2022   HGB 16.1 09/30/2022   HCT 46.6 09/30/2022   MCV 91 09/30/2022   PLT 219 09/30/2022    Lab Results  Component Value Date   CREATININE 0.86 09/30/2022    Lab Results  Component Value Date   PSA 2.7 11/30/2019   PSA 4.5 (H) 04/11/2019   PSA 3.0 09/20/2017    No results found for: "TESTOSTERONE"  Lab Results  Component Value Date   HGBA1C 6.4 (H) 09/30/2022    Urinalysis    Component Value Date/Time   COLORURINE AMBER (A) 04/16/2019 1642   APPEARANCEUR Clear 04/14/2022 1015   LABSPEC 1.019 04/16/2019 1642   PHURINE 5.0 04/16/2019 1642   GLUCOSEU Negative 04/14/2022 1015   HGBUR LARGE (A) 04/16/2019 1642   BILIRUBINUR Negative 04/14/2022 1015   KETONESUR NEGATIVE 04/16/2019 1642   PROTEINUR Negative 04/14/2022 1015   PROTEINUR 30 (A) 04/16/2019 1642   UROBILINOGEN 1.0 06/22/2016 1310   UROBILINOGEN 0.2 05/12/2012  0901   NITRITE Negative 04/14/2022 1015   NITRITE NEGATIVE 04/16/2019 1642   LEUKOCYTESUR Negative 04/14/2022 1015   LEUKOCYTESUR MODERATE (A) 04/16/2019 1642    Lab Results  Component Value Date   LABMICR See below: 04/14/2022   WBCUA None seen 04/14/2022   LABEPIT None seen 04/14/2022   MUCUS Present 04/14/2022   BACTERIA None seen 04/14/2022    Pertinent Imaging:  No results found for this or any previous visit.  No results found for this or any previous visit.  No  results found for this or any previous visit.  No results found for this or any previous visit.  No results found for this or any previous visit.  No valid procedures specified. No results found for this or any previous visit.  Results for orders placed during the hospital encounter of 04/16/19  CT RENAL STONE STUDY  Narrative CLINICAL DATA:  Weakness fever body aches  EXAM: CT ABDOMEN AND PELVIS WITHOUT CONTRAST  TECHNIQUE: Multidetector CT imaging of the abdomen and pelvis was performed following the standard protocol without IV contrast.  COMPARISON:  August 12, 2015  FINDINGS: The study is limited due to patient motion.  Lower chest: The visualized heart size within normal limits. No pericardial fluid/thickening.  No hiatal hernia.  The visualized portions of the lungs are clear.  Hepatobiliary: Although limited due to the lack of intravenous contrast, normal in appearance without gross focal abnormality. No evidence of calcified gallstones or biliary ductal dilatation.  Pancreas:  Unremarkable.  No surrounding inflammatory changes.  Spleen: Normal in size. Although limited due to the lack of intravenous contrast, normal in appearance.  Adrenals/Urinary Tract: Both adrenal glands appear normal. There is perinephric stranding seen around both kidneys and proximal ureteral stranding. Again noted are bilateral partially at exophytic low-density lesions, the largest is off the lower  pole of the right kidney measuring 4.4 cm. The bladder is partially decompressed with diffuse bladder wall thickening. No calculi are noted.  Stomach/Bowel: The stomach, small bowel, and colon are normal in appearance. No inflammatory changes or obstructive findings. appendix is normal.  Vascular/Lymphatic: There are no enlarged abdominal or pelvic lymph nodes. No significant gross vascular findings are present.  Reproductive: The prostate is unremarkable.  Other: No evidence of abdominal wall mass or hernia.  Musculoskeletal: No acute or significant osseous findings.  IMPRESSION: 1. Bilateral perinephric and proximal periureteral stranding with diffuse mild bladder wall thickening. This could be due to bilateral pyelonephritis and chronic cystitis. 2. No renal calculi or hydronephrosis 3. Bilateral simple renal cysts.   Electronically Signed By: Prudencio Pair M.D. On: 04/17/2019 17:11   Assessment & Plan:    1. Elevated PSA The patient and I talked about etiologies of elevated PSA.  We discussed the possible relationship between elevated PSA, prostate cancer, BPH, prostatitis, and UTI.   Conservative treatment of elevated PSA with watchful waiting was discussed with the patient.  All questions were answered.        All of the risks and benefits along with alternatives to prostate biopsy were discussed with the patient.  The patient gave fully informed consent to proceed with a transrectal ultrasound guided biopsy of the prostate for the evaluation of their evated PSA.  Prostate biopsy instructions and antibiotics were given to the patient.  - Urinalysis, Routine w reflex microscopic   No follow-ups on file.  Nicolette Bang, MD  [facility] [place]

## 2022-10-14 NOTE — Patient Instructions (Signed)
   Appointment [facility]:[id number] Appointment Date:11/09/2022  Location: [facility]   Prostate Biopsy Instructions  Stop all aspirin or blood thinners (aspirin, plavix, coumadin, warfarin, motrin, ibuprofen, advil, aleve, naproxen, naprosyn) for 7 days prior to the procedure.  If you have any questions about stopping these medications, please contact your primary care physician or cardiologist.  Having a light meal prior to the procedure is recommended.  If you are diabetic or have low blood sugar please bring a small snack or glucose tablet.  A Fleets enema is needed to be purchased over the counter at a local pharmacy and used 2 hours before you scheduled appointment.  This can be purchased over the counter at any pharmacy.  Antibiotics will be administered in the clinic at the time of the procedure and 1 tablet has been sent to your pharmacy. Please take the antibiotic as prescribed.    Please bring someone with you to the procedure to drive you home if you are given a valium to take prior to your procedure.   If you have any questions or concerns, please feel free to call the office at [phone] or send a Mychart message.    Thank you, [facility]

## 2022-10-16 ENCOUNTER — Other Ambulatory Visit: Payer: Self-pay | Admitting: Family Medicine

## 2022-11-07 ENCOUNTER — Other Ambulatory Visit: Payer: Self-pay | Admitting: Family Medicine

## 2022-11-09 ENCOUNTER — Ambulatory Visit (HOSPITAL_BASED_OUTPATIENT_CLINIC_OR_DEPARTMENT_OTHER): Payer: Medicare Other | Admitting: Urology

## 2022-11-09 ENCOUNTER — Other Ambulatory Visit: Payer: Self-pay | Admitting: Urology

## 2022-11-09 ENCOUNTER — Encounter (HOSPITAL_COMMUNITY): Payer: Self-pay

## 2022-11-09 ENCOUNTER — Ambulatory Visit (HOSPITAL_COMMUNITY)
Admission: RE | Admit: 2022-11-09 | Discharge: 2022-11-09 | Disposition: A | Discharge status: Home / Self Care | Payer: Medicare Other | Source: Ambulatory Visit | Attending: Urology | Admitting: Urology

## 2022-11-09 ENCOUNTER — Encounter: Payer: Self-pay | Admitting: Urology

## 2022-11-09 VITALS — BP 130/85 | HR 78 | Temp 98.2°F | Resp 18

## 2022-11-09 DIAGNOSIS — C61 Malignant neoplasm of prostate: Secondary | ICD-10-CM | POA: Diagnosis not present

## 2022-11-09 DIAGNOSIS — R972 Elevated prostate specific antigen [PSA]: Secondary | ICD-10-CM | POA: Insufficient documentation

## 2022-11-09 MED ORDER — GENTAMICIN SULFATE 40 MG/ML IJ SOLN
INTRAMUSCULAR | Status: AC
  Administered 2022-11-09: 80 mg via INTRAMUSCULAR
  Filled 2022-11-09: qty 2

## 2022-11-09 MED ORDER — LIDOCAINE HCL (PF) 2 % IJ SOLN
INTRAMUSCULAR | Status: AC
  Filled 2022-11-09: qty 10

## 2022-11-09 MED ORDER — GENTAMICIN SULFATE 40 MG/ML IJ SOLN: 80.0000 mg | Freq: Once | INTRAMUSCULAR | Status: AC

## 2022-11-09 NOTE — Progress Notes (Signed)
Prostate Biopsy Procedure   Informed consent was obtained after discussing risks/benefits of the procedure.  A time out was performed to ensure correct patient identity.  Pre-Procedure: - Last PSA Level:  Lab Results  Component Value Date   PSA 2.7 11/30/2019   PSA 4.5 (H) 04/11/2019   PSA 3.0 09/20/2017   - Gentamicin given prophylactically - Levaquin 500 mg administered PO -Transrectal Ultrasound performed revealing a 64.3 gm prostate -No significant hypoechoic or median lobe noted  Procedure: - Prostate block performed using 10 cc 1% lidocaine and biopsies taken from sextant areas, a total of 12 under ultrasound guidance.  Post-Procedure: - Patient tolerated the procedure well - He was counseled to seek immediate medical attention if experiences any severe pain, significant bleeding, or fevers - Return in one week to discuss biopsy results

## 2022-11-09 NOTE — Patient Instructions (Signed)
Transrectal Ultrasound-Guided Prostate Biopsy, Care After What can I expect after the procedure? After the procedure, it is common to have: Pain and discomfort near your butt (rectum), especially while sitting. Pink-colored pee (urine). This is due to small amounts of blood in your pee. A burning feeling while peeing. Blood in your poop (stool). Bleeding from your butt. Blood in your semen. Follow these instructions at home: Medicines Take over-the-counter and prescription medicines only as told by your doctor. If you were given a sedative during your procedure, do not drive or use machines until your doctor says that it is safe. A sedative is a medicine that helps you relax. If you were prescribed an antibiotic medicine, take it as told by your doctor. Do not stop taking it even if you start to feel better. Activity  Return to your normal activities when your doctor says that it is safe. Ask your doctor when it is okay for you to have sex. You may have to avoid lifting. Ask your doctor how much you can safely lift. General instructions  Drink enough water to keep your pee pale yellow. Watch your pee, poop, and semen for new bleeding or bleeding that gets worse. Keep all follow-up visits. Contact a doctor if: You have any of these: Blood clots in your pee or poop. Blood in your pee more than 2 weeks after the procedure. Blood in your semen more than 2 months after the procedure. New or worse bleeding in your pee, poop, or semen. Very bad belly pain. Your pee smells bad or unusual. You have trouble peeing. Your lower belly feels firm. You have problems getting an erection. You feel like you may vomit (are nauseous), or you vomit. Get help right away if: You have a fever or chills. You have bright red pee. You have very bad pain that does not get better with medicine. You cannot pee. Summary After this procedure, it is common to have pain and discomfort near your butt,  especially while sitting. You may have blood in your pee and poop. It is common to have blood in your semen. Get help right away if you have a fever or chills. This information is not intended to replace advice given to you by your health care provider. Make sure you discuss any questions you have with your health care provider. Document Revised: 02/24/2021 Document Reviewed: 02/24/2021 Elsevier Patient Education  [place].

## 2022-11-22 ENCOUNTER — Other Ambulatory Visit: Payer: Self-pay | Admitting: Podiatry

## 2022-11-23 ENCOUNTER — Ambulatory Visit: Payer: Medicare Other | Admitting: Urology

## 2022-11-24 ENCOUNTER — Ambulatory Visit: Payer: Medicare Other | Admitting: Urology

## 2022-12-01 ENCOUNTER — Ambulatory Visit: Payer: Medicare Other | Admitting: Urology

## 2022-12-01 ENCOUNTER — Encounter: Payer: Self-pay | Admitting: Urology

## 2022-12-01 VITALS — BP 168/78 | HR 80

## 2022-12-01 DIAGNOSIS — R972 Elevated prostate specific antigen [PSA]: Secondary | ICD-10-CM | POA: Diagnosis not present

## 2022-12-01 DIAGNOSIS — C61 Malignant neoplasm of prostate: Secondary | ICD-10-CM | POA: Diagnosis not present

## 2022-12-01 NOTE — Patient Instructions (Signed)

## 2022-12-01 NOTE — Progress Notes (Unsigned)
12/01/2022 4:25 PM   Christopher Lambert 01/31/1956 [id number]  Referring provider: [name], MD [address] [place],  [place] [address]  Followup prostate biopsy   HPI: [name] is a 67yo here for followup after prostate biopsy. Biopsy revealed Gleason 3+3=6 in 6/12 cores and gleason 3+4=7 in 5/12 cores. PSA 6.0. No significant LUTS. He has mild erectile dysfunction   PMH: Past Medical History:  Diagnosis Date   Abnormal cardiovascular function study 09/21/2011   Arthritis of knee    Bilateral   Diabetes mellitus 06/2008   type 2 NIDDM x 7 yrs; off meds   Diabetes mellitus without complication ([place])    Phreesia 08/18/2020   ED (erectile dysfunction)    Essential hypertension, benign 1990   H/O: whooping cough    aschild   Hemorrhoids, internal    Hypertension    Phreesia [id number]   Metabolic syndrome [id number]   Obesity    Obstructive sleep apnea 11/09/2007   NPSG 08/26/04 AHI 92/hr, [place], [place] NPSG 01/31/15- severe OSA AHI 49.4/ hr, CPAP suggested 13, AHI 0.0, weight 294 lbs CPAP Advanced     Palpitations 08/24/2011   Piles (hemorrhoids) 06/19/2014   Seasonal allergies    Sleep apnea 2004   wears CPAP nightly      Surgical History: Past Surgical History:  Procedure Laterality Date   CARDIAC CATHETERIZATION  09/25/2011   CARPAL TUNNEL RELEASE     Right    COLONOSCOPY N/A 03/23/2018   Procedure: COLONOSCOPY;  Surgeon: Daneil Dolin, MD;  [facility];  Service: Endoscopy;  Laterality: N/A;  8:00   ENDOVENOUS ABLATION SAPHENOUS VEIN W/ LASER Right 10/10/2020   endovenous laser ablation right greater saphenous vein by Gae Gallop MD    HERNIA REPAIR  [id number] approx   umbilical   JOINT REPLACEMENT  2011   right knee   JOINT REPLACEMENT  2013   left knee   left knee replacement   sept 2013   POLYPECTOMY  03/23/2018   Procedure: POLYPECTOMY;  Surgeon: Daneil Dolin, MD;  [facility];  Service: Endoscopy;;  colon   Right knee  replacement  06/2010   TOTAL KNEE ARTHROPLASTY  05/18/2012   Procedure: TOTAL KNEE ARTHROPLASTY;  Surgeon: Ninetta Lights, MD;  Location: [facility];  Service: Orthopedics;  Laterality: Left;  left total knee arthroplasty   UMBILICAL HERNIA REPAIR      Home Medications:  Allergies as of 12/01/2022   No Known Allergies      Medication List        Accurate as of December 01, 2022  4:25 PM. If you have any questions, ask your nurse or doctor.          allopurinol 300 MG tablet Commonly known as: ZYLOPRIM TAKE 1 TABLET BY MOUTH EVERY DAY   aspirin EC 81 MG tablet TAKE 1 TABLET EVERY DAY   cholecalciferol 1000 units tablet Commonly known as: VITAMIN D Take 1,000 Units by mouth daily.   ELDERBERRY PO Take 1 tablet by mouth daily.   fluticasone 50 MCG/ACT nasal spray Commonly known as: FLONASE PLACE 2 SPRAYS INTO BOTH NOSTRILS 2 (TWO) TIMES DAILY   furosemide 20 MG tablet Commonly known as: LASIX TAKE 1 TABLET BY MOUTH EVERY DAY   losartan 25 MG tablet Commonly known as: COZAAR TAKE 1 TABLET BY MOUTH EVERY DAY   meloxicam 15 MG tablet Commonly known as: MOBIC TAKE 1 TABLET (15 MG TOTAL) BY  MOUTH DAILY.   metFORMIN 500 MG tablet Commonly known as: GLUCOPHAGE Take 1 tablet (500 mg total) by mouth daily with breakfast.   multivitamin with minerals Tabs tablet Take 1 tablet by mouth daily.   NIFEdipine 90 MG 24 hr tablet Commonly known as: PROCARDIA XL/NIFEDICAL-XL TAKE 1 TABLET BY MOUTH EVERY DAY   potassium chloride 10 MEQ tablet Commonly known as: KLOR-CON TAKE 2 TABLETS BY MOUTH EVERY DAY   simvastatin 5 MG tablet Commonly known as: ZOCOR TAKE 1 TABLET BY MOUTH EVERYDAY AT BEDTIME        Allergies: No Known Allergies  Family History: Family History  Problem Relation Age of Onset   Coronary artery disease Brother 86       Premature   Hypertension Brother    Hypertension Father    Cancer Father 68       colon    Hypertension Mother    Diabetes  Brother    Hypertension Sister    Hypertension Sister    Hypertension Sister    Hypertension Sister     Social History:  reports that he has quit smoking. His smoking use included cigarettes. He smoked an average of .5 packs per day. He has never used smokeless tobacco. He reports that he does not drink alcohol and does not use drugs.  ROS: All other review of systems were reviewed and are negative except what is noted above in HPI  Physical Exam: BP (!) 168/78   Pulse 80   Constitutional:  Alert and oriented, No acute distress. HEENT: [place] AT, moist mucus membranes.  Trachea midline, no masses. Cardiovascular: No clubbing, cyanosis, or edema. Respiratory: Normal respiratory effort, no increased work of breathing. GI: Abdomen is soft, nontender, nondistended, no abdominal masses GU: No CVA tenderness.  Lymph: No cervical or inguinal lymphadenopathy. Skin: No rashes, bruises or suspicious lesions. Neurologic: Grossly intact, no focal deficits, moving all 4 extremities. Psychiatric: Normal mood and affect.  Laboratory Data: Lab Results  Component Value Date   WBC 4.3 09/30/2022   HGB 16.1 09/30/2022   HCT 46.6 09/30/2022   MCV 91 09/30/2022   PLT 219 09/30/2022    Lab Results  Component Value Date   CREATININE 0.86 09/30/2022    Lab Results  Component Value Date   PSA 2.7 11/30/2019   PSA 4.5 (H) 04/11/2019   PSA 3.0 09/20/2017    No results found for: "TESTOSTERONE"  Lab Results  Component Value Date   HGBA1C 6.4 (H) 09/30/2022    Urinalysis    Component Value Date/Time   COLORURINE AMBER (A) 04/16/2019 1642   APPEARANCEUR Clear 10/14/2022 1414   LABSPEC 1.019 04/16/2019 1642   PHURINE 5.0 04/16/2019 1642   GLUCOSEU Negative 10/14/2022 1414   HGBUR LARGE (A) 04/16/2019 1642   BILIRUBINUR Negative 10/14/2022 1414   KETONESUR NEGATIVE 04/16/2019 1642   PROTEINUR Negative 10/14/2022 1414   PROTEINUR 30 (A) 04/16/2019 1642   UROBILINOGEN 1.0 06/22/2016  1310   UROBILINOGEN 0.2 05/12/2012 0901   NITRITE Negative 10/14/2022 1414   NITRITE NEGATIVE 04/16/2019 1642   LEUKOCYTESUR Negative 10/14/2022 1414   LEUKOCYTESUR MODERATE (A) 04/16/2019 1642    Lab Results  Component Value Date   LABMICR See below: 10/14/2022   WBCUA 0-5 10/14/2022   LABEPIT 0-10 10/14/2022   MUCUS Present 04/14/2022   BACTERIA None seen 10/14/2022    Pertinent Imaging:  No results found for this or any previous visit.  No results found for this or any previous visit.  No results found for this or any previous visit.  No results found for this or any previous visit.  No results found for this or any previous visit.  No valid procedures specified. No results found for this or any previous visit.  Results for orders placed during the hospital encounter of 04/16/19  CT RENAL STONE STUDY  Narrative CLINICAL DATA:  Weakness fever body aches  EXAM: CT ABDOMEN AND PELVIS WITHOUT CONTRAST  TECHNIQUE: Multidetector CT imaging of the abdomen and pelvis was performed following the standard protocol without IV contrast.  COMPARISON:  August 12, 2015  FINDINGS: The study is limited due to patient motion.  Lower chest: The visualized heart size within normal limits. No pericardial fluid/thickening.  No hiatal hernia.  The visualized portions of the lungs are clear.  Hepatobiliary: Although limited due to the lack of intravenous contrast, normal in appearance without gross focal abnormality. No evidence of calcified gallstones or biliary ductal dilatation.  Pancreas:  Unremarkable.  No surrounding inflammatory changes.  Spleen: Normal in size. Although limited due to the lack of intravenous contrast, normal in appearance.  Adrenals/Urinary Tract: Both adrenal glands appear normal. There is perinephric stranding seen around both kidneys and proximal ureteral stranding. Again noted are bilateral partially at exophytic low-density lesions, the  largest is off the lower pole of the right kidney measuring 4.4 cm. The bladder is partially decompressed with diffuse bladder wall thickening. No calculi are noted.  Stomach/Bowel: The stomach, small bowel, and colon are normal in appearance. No inflammatory changes or obstructive findings. appendix is normal.  Vascular/Lymphatic: There are no enlarged abdominal or pelvic lymph nodes. No significant gross vascular findings are present.  Reproductive: The prostate is unremarkable.  Other: No evidence of abdominal wall mass or hernia.  Musculoskeletal: No acute or significant osseous findings.  IMPRESSION: 1. Bilateral perinephric and proximal periureteral stranding with diffuse mild bladder wall thickening. This could be due to bilateral pyelonephritis and chronic cystitis. 2. No renal calculi or hydronephrosis 3. Bilateral simple renal cysts.   Electronically Signed By: Prudencio Pair M.D. On: 04/17/2019 17:11   Assessment & Plan:    Prostate Cancer I discussed the natural history of favorable intermediate risk prostate cancer with the patient and the various treatment options including active surveillance, RALP, IMRT, brachytherapy, cryotherapy, HIFU and ADT. After discussing the options the patient is undecided. I will refer him to Dr. Tresa Moore for consideration of RALP and Dr Tammi Klippel for consideration of radiation therapy.   No follow-ups on file.  Nicolette Bang, MD  [facility] [place]

## 2022-12-03 ENCOUNTER — Encounter: Payer: Self-pay | Admitting: Urology

## 2022-12-05 ENCOUNTER — Other Ambulatory Visit: Payer: Self-pay | Admitting: Family Medicine

## 2022-12-06 ENCOUNTER — Other Ambulatory Visit: Payer: Self-pay | Admitting: Family Medicine

## 2022-12-09 NOTE — Progress Notes (Signed)
GU Location of Tumor / Histology: Prostate Ca  If Prostate Cancer, Gleason Score is (3 + 4) and PSA is (6.0 on 09/30/2022)  Biopsies      Past/Anticipated interventions by urology, if any:    12/03/2022 Dr. Alyson Ingles   Past/Anticipated interventions by medical oncology, if any: NA  Weight changes, if any:  No  IPSS:  8 SHIM:  19  Bowel/Bladder complaints, if any:  No  Nausea/Vomiting, if any:  No  Pain issues, if any:  3/10 lower back and right buttocks  SAFETY ISSUES: Prior radiation?  No Pacemaker/ICD? No Possible current pregnancy? Male Is the patient on methotrexate? No  Current Complaints / other details:

## 2022-12-11 NOTE — Progress Notes (Signed)
Radiation Oncology         [phone] ________________________________  Initial Outpatient Consultation  Name: Christopher Lambert MRN: [id number]  Date: 12/14/2022  DOB: [birth year]  QM:[id number], [name], MD  McKenzie, [name], MD   REFERRING PHYSICIAN: Malen Gauze, MD  DIAGNOSIS: 67 y.o. gentleman with Stage T1c adenocarcinoma of the prostate with Gleason score of 3+4, and PSA of 6.1.    ICD-10-CM   1. Malignant neoplasm of prostate  C61       HISTORY OF PRESENT ILLNESS: Christopher Lambert is a 67 y.o. male with a diagnosis of prostate cancer. He was noted to have an elevated PSA of 6.4 by his primary care physician, Dr. Lodema Hong.  Accordingly, he was referred for evaluation in urology by Dr. Ronne Binning on 04/14/22,  digital rectal examination was performed at that time revealing a smooth prostate, with no nodules or induration. The prostate volume measured 40 cc. Repeat PSA taken at that visit was elevated at 6.1. The patient proceeded to transrectal ultrasound with 12 biopsies of the prostate on 11/09/22.  Out of 12 core biopsies, 11 were positive.  The maximum Gleason score was 3+4, and this was seen in left apex lateral, left mid, right mid, right base lateral, and right mid lateral. Additionally, a Gleason score of 3 + 3 was seen in the left mid lateral, left base, left apex, right base, right apex, and right apex lateral.  The patient reviewed the biopsy results with his urologist and he has kindly been referred today for discussion of potential radiation treatment options.   PREVIOUS RADIATION THERAPY: No  PAST MEDICAL HISTORY:  Past Medical History:  Diagnosis Date   Abnormal cardiovascular function study 09/21/2011   Arthritis of knee    Bilateral   Diabetes mellitus 06/2008   type 2 NIDDM x 7 yrs; off meds   Diabetes mellitus without complication    Phreesia 08/18/2020   ED (erectile dysfunction)    Essential hypertension, benign 1990   H/O: whooping cough    aschild    Hemorrhoids, internal    Hypertension    Phreesia 08/18/2020   Metabolic syndrome 01/23/2012   Obesity    Obstructive sleep apnea 11/09/2007   NPSG 08/26/04 AHI 92/hr, [place], [place] NPSG 01/31/15- severe OSA AHI 49.4/ hr, CPAP suggested 13, AHI 0.0, weight 294 lbs CPAP Advanced     Palpitations 08/24/2011   Piles (hemorrhoids) 06/19/2014   Seasonal allergies    Sleep apnea 2004   wears CPAP nightly        PAST SURGICAL HISTORY: Past Surgical History:  Procedure Laterality Date   CARDIAC CATHETERIZATION  09/25/2011   CARPAL TUNNEL RELEASE     Right    COLONOSCOPY N/A 03/23/2018   Procedure: COLONOSCOPY;  Surgeon: Corbin Ade, MD;  Location: [facility];  Service: Endoscopy;  Laterality: N/A;  8:00   ENDOVENOUS ABLATION SAPHENOUS VEIN W/ LASER Right 10/10/2020   endovenous laser ablation right greater saphenous vein by Cari Caraway MD    HERNIA REPAIR  2006 approx   umbilical   JOINT REPLACEMENT  2011   right knee   JOINT REPLACEMENT  2013   left knee   left knee replacement   sept 2013   POLYPECTOMY  03/23/2018   Procedure: POLYPECTOMY;  Surgeon: Corbin Ade, MD;  Location: [facility];  Service: Endoscopy;;  colon   Right knee replacement  06/2010   TOTAL KNEE ARTHROPLASTY  05/18/2012   Procedure: TOTAL KNEE  ARTHROPLASTY;  Surgeon: Loreta Ave, MD;  Location: [facility] OR;  Service: Orthopedics;  Laterality: Left;  left total knee arthroplasty   UMBILICAL HERNIA REPAIR      FAMILY HISTORY:  Family History  Problem Relation Age of Onset   Coronary artery disease Brother 53       Premature   Hypertension Brother    Hypertension Father    Cancer Father 91       colon    Hypertension Mother    Diabetes Brother    Hypertension Sister    Hypertension Sister    Hypertension Sister    Hypertension Sister     SOCIAL HISTORY:  Social History   Socioeconomic History   Marital status: Married    Spouse name: Psychologist, forensic   Number of children: 0   Years of education:  Not on file   Highest education level: Not on file  Occupational History    Employer: [employer]  Tobacco Use   Smoking status: Former    Packs/day: .5    Types: Cigarettes   Smokeless tobacco: Never  Vaping Use   Vaping Use: Never used  Substance and Sexual Activity   Alcohol use: No   Drug use: No   Sexual activity: Yes  Other Topics Concern   Not on file  Social History Narrative   Married x 36 yrs in 2022.   Social Determinants of Health   Financial Resource Strain: Low Risk  (06/18/2021)   Overall Financial Resource Strain (CARDIA)    Difficulty of Paying Living Expenses: Not hard at all  Food Insecurity: No Food Insecurity (12/14/2022)   Hunger Vital Sign    Worried About Running Out of Food in the Last Year: Never true    Ran Out of Food in the Last Year: Never true  Transportation Needs: No Transportation Needs (12/14/2022)   PRAPARE - Administrator, Civil Service (Medical): No    Lack of Transportation (Non-Medical): No  Physical Activity: Sufficiently Active (06/18/2021)   Exercise Vital Sign    Days of Exercise per Week: 5 days    Minutes of Exercise per Session: 30 min  Stress: No Stress Concern Present (06/18/2021)   [employer] of Occupational Health - Occupational Stress Questionnaire    Feeling of Stress : Not at all  Social Connections: Socially Integrated (06/18/2021)   Social Connection and Isolation Panel [NHANES]    Frequency of Communication with Friends and Family: More than three times a week    Frequency of Social Gatherings with Friends and Family: More than three times a week    Attends Religious Services: More than 4 times per year    Active Member of [employer] or Organizations: Yes    Attends Banker Meetings: More than 4 times per year    Marital Status: Married  Catering manager Violence: Not At Risk (12/14/2022)   Humiliation, Afraid, Rape, and Kick questionnaire    Fear of Current or Ex-Partner: No     Emotionally Abused: No    Physically Abused: No    Sexually Abused: No    ALLERGIES: Patient has no known allergies.  MEDICATIONS:  Current Outpatient Medications  Medication Sig Dispense Refill   lisinopril-hydrochlorothiazide (ZESTORETIC) 20-25 MG tablet Take by mouth.     allopurinol (ZYLOPRIM) 300 MG tablet TAKE 1 TABLET BY MOUTH EVERY DAY 90 tablet 1   aspirin 81 MG EC tablet TAKE 1 TABLET EVERY DAY (Patient taking differently: Take  81 mg by mouth daily.) 150 tablet 3   cholecalciferol (VITAMIN D) 1000 UNITS tablet Take 1,000 Units by mouth daily.     ELDERBERRY PO Take 1 tablet by mouth daily.      fluticasone (FLONASE) 50 MCG/ACT nasal spray PLACE 2 SPRAYS INTO BOTH NOSTRILS 2 (TWO) TIMES DAILY 48 mL 1   furosemide (LASIX) 20 MG tablet TAKE 1 TABLET BY MOUTH EVERY DAY 90 tablet 2   losartan (COZAAR) 25 MG tablet TAKE 1 TABLET BY MOUTH EVERY DAY 90 tablet 1   meloxicam (MOBIC) 15 MG tablet TAKE 1 TABLET (15 MG TOTAL) BY MOUTH DAILY. 30 tablet 3   metFORMIN (GLUCOPHAGE) 500 MG tablet Take 1 tablet (500 mg total) by mouth daily with breakfast. 90 tablet 3   Multiple Vitamin (MULTIVITAMIN WITH MINERALS) TABS tablet Take 1 tablet by mouth daily.     NIFEdipine (PROCARDIA XL/NIFEDICAL-XL) 90 MG 24 hr tablet TAKE 1 TABLET BY MOUTH EVERY DAY 90 tablet 1   potassium chloride (KLOR-CON) 10 MEQ tablet TAKE 2 TABLETS BY MOUTH EVERY DAY 180 tablet 0   simvastatin (ZOCOR) 5 MG tablet TAKE 1 TABLET BY MOUTH EVERYDAY AT BEDTIME 90 tablet 3   No current facility-administered medications for this encounter.    REVIEW OF SYSTEMS:  On review of systems, the patient reports that he is doing well overall. He denies any chest pain, shortness of breath, cough, fevers, chills, night sweats, unintended weight changes. He denies any bowel disturbances, and denies abdominal pain, nausea or vomiting. He denies any new musculoskeletal or joint aches or pains. His IPSS was Total Score: 8, indicating moderate  urinary symptoms.  His SHIM was SHIM: 19, indicating he mild erectile dysfunction. A complete review of systems is obtained and is otherwise negative.   PHYSICAL EXAM:  Wt Readings from Last 3 Encounters:  12/14/22 294 lb 2 oz (133.4 kg)  09/30/22 271 lb (122.9 kg)  07/21/22 287 lb (130.2 kg)   Temp Readings from Last 3 Encounters:  12/14/22 (!) 97.4 F (36.3 C) (Temporal)  11/09/22 98.2 F (36.8 C) (Oral)  04/02/22 98.2 F (36.8 C)   BP Readings from Last 3 Encounters:  12/14/22 139/89  12/01/22 (!) 168/78  11/09/22 130/85   Pulse Readings from Last 3 Encounters:  12/14/22 73  12/01/22 80  11/09/22 78   Pain Assessment Pain Score: 3  Pain Loc: Back (lower back and right buttoks)/10  In general this is a well appearing gentleman in no acute distress. He's alert and oriented x4 and appropriate throughout the examination. Cardiopulmonary assessment is negative for acute distress, and he exhibits normal effort.    KPS = 100  100 - Normal; no complaints; no evidence of disease. 90   - Able to carry on normal activity; minor signs or symptoms of disease. 80   - Normal activity with effort; some signs or symptoms of disease. 53   - Cares for self; unable to carry on normal activity or to do active work. 60   - Requires occasional assistance, but is able to care for most of his personal needs. 50   - Requires considerable assistance and frequent medical care. 40   - Disabled; requires special care and assistance. 30   - Severely disabled; hospital admission is indicated although death not imminent. 20   - Very sick; hospital admission necessary; active supportive treatment necessary. 10   - Moribund; fatal processes progressing rapidly. 0     - Dead  Karnofsky DA,  Abelmann WH, Craver LS and [place] [facility] [phone]) The use of the nitrogen mustards in the palliative treatment of carcinoma: with particular reference to bronchogenic carcinoma Cancer 1 634-56  LABORATORY DATA:  Lab  Results  Component Value Date   WBC 4.3 09/30/2022   HGB 16.1 09/30/2022   HCT 46.6 09/30/2022   MCV 91 09/30/2022   PLT 219 09/30/2022   Lab Results  Component Value Date   NA 141 09/30/2022   K 3.8 09/30/2022   CL 102 09/30/2022   CO2 24 09/30/2022   Lab Results  Component Value Date   ALT 26 09/30/2022   AST 19 09/30/2022   ALKPHOS 46 09/30/2022   BILITOT 0.9 09/30/2022     RADIOGRAPHY: No results found.    IMPRESSION/PLAN: 1. 67 y.o. gentleman with Stage T1c adenocarcinoma of the prostate with Gleason score of 3+4, and PSA of 6.1. We discussed the patient's workup and outlined the nature of prostate cancer in this setting. The patient's T stage, Gleason's score, and PSA put him into the favorable intermediate risk group. Accordingly, he is eligible for a variety of potential treatment options including brachytherapy, 5.5-8 weeks of external radiation, or prostatectomy. We discussed the available radiation techniques, and focused on the details and logistics of delivery. We discussed and outlined the risks, benefits, short and long-term effects associated with radiotherapy and compared and contrasted these with prostatectomy. We discussed the role of SpaceOAR gel in reducing the rectal toxicity associated with radiotherapy. He appears to have a good understanding of his disease and our treatment recommendations which are of curative intent.  He was encouraged to ask questions that were answered to his stated satisfaction.  At the conclusion of our conversation, the patient is interested in moving forward with brachytherapy. He has a 30-40 minute drive to the cancer center, so he would like to proceed with a one-time procedure instead of 5.5 weeks of daily treatment. We look forward to participating in this patient's care. A member from our scheduling team met with the patient today to get him scheduled for seed placement.  We personally spent 45 minutes in this encounter including  chart review, reviewing radiological studies, meeting face-to-face with the patient, entering orders and completing documentation.    Joyice Faster, PA-C    Margaretmary Dys, MD  [facility]  Radiation Oncology Direct Dial: [phone]  Fax: [phone] [email or web address]  Skype  LinkedIn

## 2022-12-14 ENCOUNTER — Encounter: Payer: Self-pay | Admitting: Radiation Oncology

## 2022-12-14 ENCOUNTER — Ambulatory Visit
Admission: RE | Admit: 2022-12-14 | Discharge: 2022-12-14 | Disposition: A | Discharge status: Home / Self Care | Payer: Medicare Other | Source: Ambulatory Visit | Attending: Radiation Oncology | Admitting: Radiation Oncology

## 2022-12-14 ENCOUNTER — Other Ambulatory Visit: Payer: Self-pay

## 2022-12-14 VITALS — BP 139/89 | HR 73 | Temp 97.4°F | Resp 18 | Ht 67.5 in | Wt 294.1 lb

## 2022-12-14 DIAGNOSIS — C61 Malignant neoplasm of prostate: Secondary | ICD-10-CM | POA: Insufficient documentation

## 2022-12-14 NOTE — Progress Notes (Signed)
Introduced myself to the patient as the prostate nurse navigator.  No barriers to care identified at this time.  He is here to discuss his radiation treatment options.  Patient currently does not have an appointment finalized for surgical consult @ [facility].  RN will ensure patient gets surgical consult prior to finalizing any treatment decisions.  I gave him my business card and asked him to call me with questions or concerns.  Verbalized understanding.

## 2022-12-17 ENCOUNTER — Ambulatory Visit: Payer: Medicare Other | Admitting: Internal Medicine

## 2022-12-18 NOTE — Progress Notes (Signed)
RN spoke with patient to review decision to proceed with brachytherapy.  RN confirming if patient would still like to proceed with surgical consult @ [facility] for prostatectomy.  Pt does not want to move forward with consult, and wishes to proceed with brachytherapy.   RN provided education on next steps. Pt verbalized understanding and agreement.   Plan of care in progress.

## 2022-12-23 NOTE — Progress Notes (Signed)
Patient ID: Christopher Lambert, male    DOB: 1956-02-22, 67 y.o.   MRN: [id number]  HPI male never smoker followed for OSA, rhinitis, complicated by HBP, DM 2, morbid obesity NPSG 01/31/15- severe OSA AHI 49.4/ hr, CPAP suggested 13, AHI 0.0, weight 294 lb  -------------------------------------------------------------------------------   12/16/21-  67 year old male never smoker followed for OSA, rhinitis, complicated by HBP, DM 2, morbid obesity, Peripheral Venous Insufficiency,  CPAP 16/Adapt Download-compliance -forgot card Body weight [id number] lbs Covid vax-4 Moderna  Flu vax-had ------He is doing well and feels that his mouth is dry after waking up.  He would like a DME closer to his home for replacement of old CPAP. Uses every night and sleeps better with CPAP. We discussed change to auto 10-20.  12/24/22-   67 year old male former smoker followed for OSA, rhinitis, complicated by HBP, DM 2, morbid obesity, Peripheral Venous Insufficiency, Prostate Cancer,  CPAP 10-20/Adapt                       Pressure ranging 15.9- 19.5 Download-compliance -100%, AHI 5.5/ hr Body weight today- 289 lbs Download reviewed.  He reports good experience with his CPAP, sleeping well with no concerns.  If he can keep his weight down I think we can avoid having to transition to BiPAP but we can watch this. He is pending radioactive seeds for prostate cancer.  ROS-see HPI + = positive Constitutional:   No-   weight loss, night sweats, fevers, chills, fatigue, lassitude. HEENT:   No-  headaches, difficulty swallowing, tooth/dental problems, sore throat,       No-  sneezing, itching, ear ache, nasal congestion, post nasal drip,  CV:  No-   chest pain, orthopnea, PND, +swelling in lower extremities, anasarca, dizziness, palpitations Resp: No-   shortness of breath with exertion or at rest.              No-   productive cough,  No non-productive cough,  No- coughing up of blood.              No-   change in color  of mucus.  No- wheezing.   Skin: No-   rash or lesions. GI:  No-   heartburn, indigestion, abdominal pain, nausea, vomiting, GU:  MS:  No-   joint pain or swelling.   Neuro-     nothing unusual Psych:  No- change in mood or affect. No depression or anxiety.  No memory loss.  Objective:   Physical Exam General- Alert, Oriented, Affect-appropriate, Distress- none acute, +morbidly obese Skin- rash-none, lesions- none, excoriation- none Lymphadenopathy- none Head- atraumatic            Eyes- Gross vision intact, PERRLA, conjunctivae clear secretions            Ears- Hearing, canals-normal            Nose- Clear, no-Septal dev, mucus, polyps, erosion, perforation             Throat- Mallampati IV , mucosa clear, drainage- none, tonsils- atrophic Neck- flexible , trachea midline, no stridor , thyroid nl, carotid no bruit Chest - symmetrical excursion , unlabored           Heart/CV- RRR , no murmur , no gallop  , no rub, nl s1 s2                           - JVD-  none , elastic hose+           Lung- clear to P&A, wheeze- none, cough- none , dullness-none, rub- none           Chest wall-  Abd-  Br/ Gen/ Rectal- Not done, not indicated Extrem- +elastic hose R leg Neuro- grossly intact to observation

## 2022-12-24 ENCOUNTER — Encounter: Payer: Self-pay | Admitting: Internal Medicine

## 2022-12-24 ENCOUNTER — Ambulatory Visit: Payer: Medicare Other | Admitting: Internal Medicine

## 2022-12-24 ENCOUNTER — Other Ambulatory Visit: Payer: Self-pay | Admitting: Family Medicine

## 2022-12-24 VITALS — BP 130/78 | HR 74 | Ht 67.5 in | Wt 289.4 lb

## 2022-12-24 DIAGNOSIS — G4733 Obstructive sleep apnea (adult) (pediatric): Secondary | ICD-10-CM | POA: Diagnosis not present

## 2022-12-24 NOTE — Patient Instructions (Signed)
We can continue CPAP auto 10-20  Please call if we can help 

## 2023-01-01 ENCOUNTER — Telehealth: Payer: Self-pay

## 2023-01-01 NOTE — Telephone Encounter (Signed)
I spoke with Mr. Navarra. We have discussed possible surgery dates and 02/11/2023 was agreed upon by all parties. Patient given information about surgery date, what to expect pre-operatively and post operatively.    We discussed that a pre-op nurse will be calling to set up the pre-op visit that will take place prior to surgery. Informed patient that our office will communicate any additional care to be provided after surgery.    Patients questions or concerns were discussed during our call. Advised to call our office should there be any additional information, questions or concerns that arise. Patient verbalized understanding.

## 2023-01-04 ENCOUNTER — Ambulatory Visit: Payer: Medicare Other | Admitting: Urology

## 2023-01-04 VITALS — BP 116/75 | HR 62 | Ht 67.5 in | Wt 289.0 lb

## 2023-01-04 DIAGNOSIS — R972 Elevated prostate specific antigen [PSA]: Secondary | ICD-10-CM

## 2023-01-04 DIAGNOSIS — C61 Malignant neoplasm of prostate: Secondary | ICD-10-CM | POA: Diagnosis not present

## 2023-01-04 LAB — URINALYSIS, ROUTINE W REFLEX MICROSCOPIC
Bilirubin, UA: NEGATIVE
Glucose, UA: NEGATIVE
Ketones, UA: NEGATIVE
Leukocytes,UA: NEGATIVE
Nitrite, UA: NEGATIVE
Protein,UA: NEGATIVE
Specific Gravity, UA: 1.015 (ref 1.005–1.030)
Urobilinogen, Ur: 0.2 mg/dL (ref 0.2–1.0)
pH, UA: 7 (ref 5.0–7.5)

## 2023-01-04 LAB — MICROSCOPIC EXAMINATION: Bacteria, UA: NONE SEEN

## 2023-01-04 NOTE — Progress Notes (Unsigned)
01/04/2023 9:56 AM   Christopher Lambert [birth year] [id number]  Referring provider: Kerri Perches, MD [address], [address] [place],  [place] [address]  No chief complaint on file.   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; off meds   Diabetes mellitus without complication    Phreesia 08/18/2020   ED (erectile dysfunction)    Essential hypertension, benign 1990   H/O: whooping cough    aschild   Hemorrhoids, internal    Hypertension    Phreesia 08/18/2020   Metabolic syndrome 01/23/2012   Obesity    Obstructive sleep apnea 11/09/2007   NPSG 08/26/04 AHI 92/hr, [place], [place] NPSG 01/31/15- severe OSA AHI 49.4/ hr, CPAP suggested 13, AHI 0.0, weight 294 lbs CPAP Advanced     Palpitations 08/24/2011   Piles (hemorrhoids) 06/19/2014   Seasonal allergies    Sleep apnea 2004   wears CPAP nightly      Surgical History: Past Surgical History:  Procedure Laterality Date   CARDIAC CATHETERIZATION  09/25/2011   CARPAL TUNNEL RELEASE     Right    COLONOSCOPY N/A 03/23/2018   Procedure: COLONOSCOPY;  Surgeon: Corbin Ade, MD;  [facility];  Service: Endoscopy;  Laterality: N/A;  8:00   ENDOVENOUS ABLATION SAPHENOUS VEIN W/ LASER Right 10/10/2020   endovenous laser ablation right greater saphenous vein by Cari Caraway MD    HERNIA REPAIR  2006 approx   umbilical   JOINT REPLACEMENT  2011   right knee   JOINT REPLACEMENT  2013   left knee   left knee replacement   sept 2013   POLYPECTOMY  03/23/2018   Procedure: POLYPECTOMY;  Surgeon: Corbin Ade, MD;  [facility]: [facility];  Service: Endoscopy;;  colon   Right knee replacement  06/2010   TOTAL KNEE ARTHROPLASTY  05/18/2012   Procedure: TOTAL KNEE ARTHROPLASTY;  Surgeon: Loreta Ave, MD;  [facility];  Service: Orthopedics;  Laterality: Left;  left total  knee arthroplasty   UMBILICAL HERNIA REPAIR      Home Medications:  Allergies as of 01/04/2023   No Known Allergies      Medication List        Accurate as of January 04, 2023  9:56 AM. If you have any questions, ask your nurse or doctor.          allopurinol 300 MG tablet Commonly known as: ZYLOPRIM TAKE 1 TABLET BY MOUTH EVERY DAY   aspirin EC 81 MG tablet TAKE 1 TABLET EVERY DAY   cholecalciferol 1000 units tablet Commonly known as: VITAMIN D Take 1,000 Units by mouth daily.   ELDERBERRY PO Take 1 tablet by mouth daily.   fluticasone 50 MCG/ACT nasal spray Commonly known as: FLONASE PLACE 2 SPRAYS INTO BOTH NOSTRILS 2 (TWO) TIMES DAILY   furosemide 20 MG tablet Commonly known as: LASIX TAKE 1 TABLET BY MOUTH EVERY DAY   lisinopril-hydrochlorothiazide 20-25 MG tablet Commonly known as: ZESTORETIC Take by mouth.   losartan 25 MG tablet Commonly known as: COZAAR TAKE 1 TABLET BY MOUTH EVERY DAY   meloxicam 15 MG tablet Commonly known as: MOBIC TAKE 1 TABLET (15 MG TOTAL) BY MOUTH DAILY.   metFORMIN 500 MG tablet Commonly known as: GLUCOPHAGE Take 1 tablet (500 mg total) by  mouth daily with breakfast.   multivitamin with minerals Tabs tablet Take 1 tablet by mouth daily.   NIFEdipine 90 MG 24 hr tablet Commonly known as: PROCARDIA XL/NIFEDICAL-XL TAKE 1 TABLET BY MOUTH EVERY DAY   potassium chloride 10 MEQ tablet Commonly known as: KLOR-CON TAKE 2 TABLETS BY MOUTH EVERY DAY   simvastatin 5 MG tablet Commonly known as: ZOCOR TAKE 1 TABLET BY MOUTH EVERYDAY AT BEDTIME        Allergies: No Known Allergies  Family History: Family History  Problem Relation Age of Onset   Coronary artery disease Brother 77       Premature   Hypertension Brother    Hypertension Father    Cancer Father 82       colon    Hypertension Mother    Diabetes Brother    Hypertension Sister    Hypertension Sister    Hypertension Sister    Hypertension Sister      Social History:  reports that he has quit smoking. His smoking use included cigarettes. He smoked an average of .5 packs per day. He has never used smokeless tobacco. He reports that he does not drink alcohol and does not use drugs.  ROS: All other review of systems were reviewed and are negative except what is noted above in HPI  Physical Exam: BP 116/75   Pulse 62   Ht 5' 7.5" (1.715 m)   Wt 289 lb (131.1 kg)   BMI 44.60 kg/m   Constitutional:  Alert and oriented, No acute distress. HEENT: [place] AT, moist mucus membranes.  Trachea midline, no masses. Cardiovascular: No clubbing, cyanosis, or edema. Respiratory: Normal respiratory effort, no increased work of breathing. GI: Abdomen is soft, nontender, nondistended, no abdominal masses GU: No CVA tenderness.  Lymph: No cervical or inguinal lymphadenopathy. Skin: No rashes, bruises or suspicious lesions. Neurologic: Grossly intact, no focal deficits, moving all 4 extremities. Psychiatric: Normal mood and affect.  Laboratory Data: Lab Results  Component Value Date   WBC 4.3 09/30/2022   HGB 16.1 09/30/2022   HCT 46.6 09/30/2022   MCV 91 09/30/2022   PLT 219 09/30/2022    Lab Results  Component Value Date   CREATININE 0.86 09/30/2022    Lab Results  Component Value Date   PSA 2.7 11/30/2019   PSA 4.5 (H) 04/11/2019   PSA 3.0 09/20/2017    No results found for: "TESTOSTERONE"  Lab Results  Component Value Date   HGBA1C 6.4 (H) 09/30/2022    Urinalysis    Component Value Date/Time   COLORURINE AMBER (A) 04/16/2019 1642   APPEARANCEUR Clear 10/14/2022 1414   LABSPEC 1.019 04/16/2019 1642   PHURINE 5.0 04/16/2019 1642   GLUCOSEU Negative 10/14/2022 1414   HGBUR LARGE (A) 04/16/2019 1642   BILIRUBINUR Negative 10/14/2022 1414   KETONESUR NEGATIVE 04/16/2019 1642   PROTEINUR Negative 10/14/2022 1414   PROTEINUR 30 (A) 04/16/2019 1642   UROBILINOGEN 1.0 06/22/2016 1310   UROBILINOGEN 0.2 05/12/2012 0901    NITRITE Negative 10/14/2022 1414   NITRITE NEGATIVE 04/16/2019 1642   LEUKOCYTESUR Negative 10/14/2022 1414   LEUKOCYTESUR MODERATE (A) 04/16/2019 1642    Lab Results  Component Value Date   LABMICR See below: 10/14/2022   WBCUA 0-5 10/14/2022   LABEPIT 0-10 10/14/2022   MUCUS Present 04/14/2022   BACTERIA None seen 10/14/2022    Pertinent Imaging: *** No results found for this or any previous visit.  No results found for this or any previous visit.  No results found for this or any previous visit.  No results found for this or any previous visit.  No results found for this or any previous visit.  No valid procedures specified. No results found for this or any previous visit.  Results for orders placed during the hospital encounter of 04/16/19  CT RENAL STONE STUDY  Narrative CLINICAL DATA:  Weakness fever body aches  EXAM: CT ABDOMEN AND PELVIS WITHOUT CONTRAST  TECHNIQUE: Multidetector CT imaging of the abdomen and pelvis was performed following the standard protocol without IV contrast.  COMPARISON:  August 12, 2015  FINDINGS: The study is limited due to patient motion.  Lower chest: The visualized heart size within normal limits. No pericardial fluid/thickening.  No hiatal hernia.  The visualized portions of the lungs are clear.  Hepatobiliary: Although limited due to the lack of intravenous contrast, normal in appearance without gross focal abnormality. No evidence of calcified gallstones or biliary ductal dilatation.  Pancreas:  Unremarkable.  No surrounding inflammatory changes.  Spleen: Normal in size. Although limited due to the lack of intravenous contrast, normal in appearance.  Adrenals/Urinary Tract: Both adrenal glands appear normal. There is perinephric stranding seen around both kidneys and proximal ureteral stranding. Again noted are bilateral partially at exophytic low-density lesions, the largest is off the lower pole of the  right kidney measuring 4.4 cm. The bladder is partially decompressed with diffuse bladder wall thickening. No calculi are noted.  Stomach/Bowel: The stomach, small bowel, and colon are normal in appearance. No inflammatory changes or obstructive findings. appendix is normal.  Vascular/Lymphatic: There are no enlarged abdominal or pelvic lymph nodes. No significant gross vascular findings are present.  Reproductive: The prostate is unremarkable.  Other: No evidence of abdominal wall mass or hernia.  Musculoskeletal: No acute or significant osseous findings.  IMPRESSION: 1. Bilateral perinephric and proximal periureteral stranding with diffuse mild bladder wall thickening. This could be due to bilateral pyelonephritis and chronic cystitis. 2. No renal calculi or hydronephrosis 3. Bilateral simple renal cysts.   Electronically Signed By: Jonna Clark M.D. On: 04/17/2019 17:11   Assessment & Plan:    1. Prostate Cancer -Patient to proceed with brachytherapy with SpaceOAR - Urinalysis, Routine w reflex microscopic   No follow-ups on file.  Wilkie Aye, MD  [facility] [place]

## 2023-01-05 ENCOUNTER — Telehealth: Payer: Self-pay | Admitting: *Deleted

## 2023-01-05 ENCOUNTER — Encounter: Payer: Self-pay | Admitting: Urology

## 2023-01-05 NOTE — Telephone Encounter (Signed)
Called patient to remind of pre-seed appts. for 01-07-23, spoke with patient and he is aware of these appts.

## 2023-01-05 NOTE — Patient Instructions (Signed)

## 2023-01-06 NOTE — Progress Notes (Signed)
  Radiation Oncology         [phone] ________________________________  Name: LORI LIEW MRN: [id number]  Date: 01/07/2023  DOB: [birth year]  SIMULATION AND TREATMENT PLANNING NOTE PUBIC ARCH STUDY  NW:GNFAOZH, [name], MD  Ronne Binning [name], MD  DIAGNOSIS:  67 y.o. gentleman with Stage T1c adenocarcinoma of the prostate with Gleason score of 3+4, and PSA of 6.1.   Oncology History   No history exists.      ICD-10-CM   1. Malignant neoplasm of prostate  C61       COMPLEX SIMULATION:  The patient presented today for evaluation for possible prostate seed implant. He was brought to the radiation planning suite and placed supine on the CT couch. A 3-dimensional image study set was obtained in upload to the planning computer. There, on each axial slice, I contoured the prostate gland. Then, using three-dimensional radiation planning tools I reconstructed the prostate in view of the structures from the transperineal needle pathway to assess for possible pubic arch interference. In doing so, I did not appreciate any pubic arch interference. Also, the patient's prostate volume was estimated based on the drawn structure. The volume was 39 cc.  Given the pubic arch appearance and prostate volume, patient remains a good candidate to proceed with prostate seed implant. Today, he freely provided informed written consent to proceed.    PLAN: The patient will undergo prostate seed implant.   ________________________________  Artist Pais. Kathrynn Running, M.D.

## 2023-01-06 NOTE — Progress Notes (Signed)
Radiation Oncology         [phone] ________________________________  Outpatient Follow up- Pre-seed visit  Name: Christopher Lambert MRN: [id number]  Date: 01/07/2023  DOB: [birth year]  WU:[id number], Christopher Mallick, MD  McKenzie, Christopher Celeste, MD   REFERRING PHYSICIAN: Malen Gauze, MD  DIAGNOSIS: 67 y.o. gentleman with Stage T1c adenocarcinoma of the prostate with Gleason score of 3+4, and PSA of 6.1.     ICD-10-CM   1. Malignant neoplasm of prostate  C61       HISTORY OF PRESENT ILLNESS: Christopher Lambert is a 68 y.o. male with a diagnosis of prostate cancer. He was noted to have an elevated PSA of 6.4 by his primary care physician, Dr. Lodema Hong.  Accordingly, he was referred for evaluation in urology by Dr. Ronne Binning on 04/14/22,  digital rectal examination was performed at that time revealing a smooth prostate, with no nodules or induration. The prostate volume measured 40 cc. Repeat PSA taken at that visit was elevated at 6.1. The patient proceeded to transrectal ultrasound with 12 biopsies of the prostate on 11/09/22.  Out of 12 core biopsies, 11 were positive.  The maximum Gleason score was 3+4, and this was seen in left apex lateral, left mid, right mid, right base lateral, and right mid lateral. Additionally, a Gleason score of 3 + 3 was seen in the left mid lateral, left base, left apex, right base, right apex, and right apex lateral.   The patient reviewed the biopsy results with his urologist and was kindly referred to [place] for discussion of potential radiation treatment options. We initially met the patient on 12/14/22 and he was most interested in proceeding with brachytherapy and SpaceOAR gel placement for treatment of his disease. He is here today for his pre-procedure imaging for planning and to answer any additional questions he may have about this treatment.   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; off meds   Diabetes mellitus without complication    Phreesia 08/18/2020   ED (erectile dysfunction)    Essential hypertension, benign 1990   H/O: whooping cough    aschild   Hemorrhoids, internal    Hypertension    Phreesia 08/18/2020   Metabolic syndrome 01/23/2012   Obesity    Obstructive sleep apnea 11/09/2007   NPSG 08/26/04 AHI 92/hr, [place], [place] NPSG 01/31/15- severe OSA AHI 49.4/ hr, CPAP suggested 13, AHI 0.0, weight 294 lbs CPAP Advanced     Palpitations 08/24/2011   Piles (hemorrhoids) 06/19/2014   Seasonal allergies    Sleep apnea 2004   wears CPAP nightly        PAST SURGICAL HISTORY: Past Surgical History:  Procedure Laterality Date   CARDIAC CATHETERIZATION  09/25/2011   CARPAL TUNNEL RELEASE     Right    COLONOSCOPY N/A 03/23/2018   Procedure: COLONOSCOPY;  Surgeon: Corbin Ade, MD;  [facility];  Service: Endoscopy;  Laterality: N/A;  8:00   ENDOVENOUS ABLATION SAPHENOUS VEIN W/ LASER Right 10/10/2020   endovenous laser ablation right greater saphenous vein by Cari Caraway MD    HERNIA REPAIR  2006 approx   umbilical   JOINT REPLACEMENT  2011   right knee   JOINT REPLACEMENT  2013   left knee   left  knee replacement   sept 2013   POLYPECTOMY  03/23/2018   Procedure: POLYPECTOMY;  Surgeon: Corbin Ade, MD;  [facility];  Service: Endoscopy;;  colon   Right knee replacement  06/2010   TOTAL KNEE ARTHROPLASTY  05/18/2012   Procedure: TOTAL KNEE ARTHROPLASTY;  Surgeon: Loreta Ave, MD;  Location: [facility] OR;  Service: Orthopedics;  Laterality: Left;  left total knee arthroplasty   UMBILICAL HERNIA REPAIR      FAMILY HISTORY:  Family History  Problem Relation Age of Onset   Coronary artery disease Brother 53       Premature   Hypertension Brother    Hypertension Father    Cancer Father 19       colon    Hypertension  Mother    Diabetes Brother    Hypertension Sister    Hypertension Sister    Hypertension Sister    Hypertension Sister     SOCIAL HISTORY:  Social History   Socioeconomic History   Marital status: Married    Spouse name: Psychologist, forensic   Number of children: 0   Years of education: Not on file   Highest education level: Not on file  Occupational History    Employer: [employer]  Tobacco Use   Smoking status: Former    Packs/day: .5    Types: Cigarettes   Smokeless tobacco: Never  Vaping Use   Vaping Use: Never used  Substance and Sexual Activity   Alcohol use: No   Drug use: No   Sexual activity: Yes  Other Topics Concern   Not on file  Social History Narrative   Married x 36 yrs in 2022.   Social Determinants of Health   Financial Resource Strain: Low Risk  (06/18/2021)   Overall Financial Resource Strain (CARDIA)    Difficulty of Paying Living Expenses: Not hard at all  Food Insecurity: No Food Insecurity (12/14/2022)   Hunger Vital Sign    Worried About Running Out of Food in the Last Year: Never true    Ran Out of Food in the Last Year: Never true  Transportation Needs: No Transportation Needs (12/14/2022)   PRAPARE - Administrator, Civil Service (Medical): No    Lack of Transportation (Non-Medical): No  Physical Activity: Sufficiently Active (06/18/2021)   Exercise Vital Sign    Days of Exercise per Week: 5 days    Minutes of Exercise per Session: 30 min  Stress: No Stress Concern Present (06/18/2021)   [employer] of Occupational Health - Occupational Stress Questionnaire    Feeling of Stress : Not at all  Social Connections: Socially Integrated (06/18/2021)   Social Connection and Isolation Panel [NHANES]    Frequency of Communication with Friends and Family: More than three times a week    Frequency of Social Gatherings with Friends and Family: More than three times a week    Attends Religious Services: More than 4 times per year     Active Member of [employer] or Organizations: Yes    Attends Banker Meetings: More than 4 times per year    Marital Status: Married  Catering manager Violence: Not At Risk (12/14/2022)   Humiliation, Afraid, Rape, and Kick questionnaire    Fear of Current or Ex-Partner: No    Emotionally Abused: No    Physically Abused: No    Sexually Abused: No    ALLERGIES: Patient has no known allergies.  MEDICATIONS:  Current Outpatient Medications  Medication Sig Dispense Refill   allopurinol (ZYLOPRIM) 300 MG tablet TAKE 1 TABLET BY MOUTH EVERY DAY 90 tablet 1   aspirin 81 MG EC tablet TAKE 1 TABLET EVERY DAY (Patient taking differently: Take 81 mg by mouth daily.) 150 tablet 3   cholecalciferol (VITAMIN D) 1000 UNITS tablet Take 1,000 Units by mouth daily.     ELDERBERRY PO Take 1 tablet by mouth daily.      fluticasone (FLONASE) 50 MCG/ACT nasal spray PLACE 2 SPRAYS INTO BOTH NOSTRILS 2 (TWO) TIMES DAILY 48 mL 1   furosemide (LASIX) 20 MG tablet TAKE 1 TABLET BY MOUTH EVERY DAY 90 tablet 2   lisinopril-hydrochlorothiazide (ZESTORETIC) 20-25 MG tablet Take by mouth.     losartan (COZAAR) 25 MG tablet TAKE 1 TABLET BY MOUTH EVERY DAY 90 tablet 1   meloxicam (MOBIC) 15 MG tablet TAKE 1 TABLET (15 MG TOTAL) BY MOUTH DAILY. 30 tablet 3   metFORMIN (GLUCOPHAGE) 500 MG tablet Take 1 tablet (500 mg total) by mouth daily with breakfast. 90 tablet 3   Multiple Vitamin (MULTIVITAMIN WITH MINERALS) TABS tablet Take 1 tablet by mouth daily.     NIFEdipine (PROCARDIA XL/NIFEDICAL-XL) 90 MG 24 hr tablet TAKE 1 TABLET BY MOUTH EVERY DAY 90 tablet 1   potassium chloride (KLOR-CON) 10 MEQ tablet TAKE 2 TABLETS BY MOUTH EVERY DAY 180 tablet 0   simvastatin (ZOCOR) 5 MG tablet TAKE 1 TABLET BY MOUTH EVERYDAY AT BEDTIME 90 tablet 3   No current facility-administered medications for this visit.    REVIEW OF SYSTEMS:  On review of systems, the patient reports that he is doing well overall. He denies any  chest pain, shortness of breath, cough, fevers, chills, night sweats, unintended weight changes. He denies any bowel disturbances, and denies abdominal pain, nausea or vomiting. He denies any new musculoskeletal or joint aches or pains. His IPSS was Total Score: 8, indicating moderate urinary symptoms.  His SHIM was SHIM: 19, indicating he mild erectile dysfunction. A complete review of systems is obtained and is otherwise negative.     PHYSICAL EXAM:  Wt Readings from Last 3 Encounters:  01/04/23 289 lb (131.1 kg)  12/24/22 289 lb 6.4 oz (131.3 kg)  12/14/22 294 lb 2 oz (133.4 kg)   Temp Readings from Last 3 Encounters:  12/14/22 (!) 97.4 F (36.3 C) (Temporal)  11/09/22 98.2 F (36.8 C) (Oral)  04/02/22 98.2 F (36.8 C)   BP Readings from Last 3 Encounters:  01/04/23 116/75  12/24/22 130/78  12/14/22 139/89   Pulse Readings from Last 3 Encounters:  01/04/23 62  12/24/22 74  12/14/22 73    /10  In general this is a well appearing African American male in no acute distress. He's alert and oriented x4 and appropriate throughout the examination. Cardiopulmonary assessment is negative for acute distress, and he exhibits normal effort.     KPS = 100  100 - Normal; no complaints; no evidence of disease. 90   - Able to carry on normal activity; minor signs or symptoms of disease. 80   - Normal activity with effort; some signs or symptoms of disease. 41   - Cares for self; unable to carry on normal activity or to do active work. 60   - Requires occasional assistance, but is able to care for most of his personal needs. 50   - Requires considerable assistance and frequent medical care. 40   - Disabled; requires special care and assistance. 30   -  Severely disabled; hospital admission is indicated although death not imminent. 20   - Very sick; hospital admission necessary; active supportive treatment necessary. 10   - Moribund; fatal processes progressing rapidly. 0     -  Dead  Karnofsky DA, Abelmann WH, Craver LS and [place] JH [phone]) The use of the nitrogen mustards in the palliative treatment of carcinoma: with particular reference to bronchogenic carcinoma Cancer 1 634-56  LABORATORY DATA:  Lab Results  Component Value Date   WBC 4.3 09/30/2022   HGB 16.1 09/30/2022   HCT 46.6 09/30/2022   MCV 91 09/30/2022   PLT 219 09/30/2022   Lab Results  Component Value Date   NA 141 09/30/2022   K 3.8 09/30/2022   CL 102 09/30/2022   CO2 24 09/30/2022   Lab Results  Component Value Date   ALT 26 09/30/2022   AST 19 09/30/2022   ALKPHOS 46 09/30/2022   BILITOT 0.9 09/30/2022     RADIOGRAPHY: No results found.    IMPRESSION/PLAN: 1. 67 y.o. gentleman with Stage T1c adenocarcinoma of the prostate with Gleason score of 3+4, and PSA of 6.1.  The patient has elected to proceed with seed implant for treatment of his disease. We reviewed the risks, benefits, short and long-term effects associated with brachytherapy and discussed the role of SpaceOAR in reducing the rectal toxicity associated with radiotherapy.  He appears to have a good understanding of his disease and our treatment recommendations which are of curative intent.  He was encouraged to ask questions that were answered to his stated satisfaction. He has freely signed written consent to proceed today in the office and a copy of this document will be placed in his medical record. His procedure is tentatively scheduled for 02/11/23 in collaboration with Dr. Ronne Binning and we will see him back for his post-procedure visit approximately 3 weeks thereafter. We look forward to continuing to participate in his care. He knows that he is welcome to call with any questions or concerns at any time in the interim.  I personally spent 30 minutes in this encounter including chart review, reviewing radiological studies, meeting face-to-face with the patient, entering orders and completing documentation.    Marguarite Arbour, MMS, PA-C [place]  [facility] Radiation Oncology Physician Assistant Direct Dial: [phone]  Fax: [phone]

## 2023-01-07 ENCOUNTER — Other Ambulatory Visit: Payer: Self-pay

## 2023-01-07 ENCOUNTER — Encounter: Payer: Self-pay | Admitting: Urology

## 2023-01-07 ENCOUNTER — Ambulatory Visit
Admission: RE | Admit: 2023-01-07 | Discharge: 2023-01-07 | Disposition: A | Discharge status: Home / Self Care | Payer: Medicare Other | Source: Ambulatory Visit | Attending: Radiation Oncology | Admitting: Radiation Oncology

## 2023-01-07 ENCOUNTER — Encounter (HOSPITAL_COMMUNITY)
Admission: RE | Admit: 2023-01-07 | Discharge: 2023-01-07 | Disposition: A | Discharge status: Home / Self Care | Payer: Medicare Other | Source: Ambulatory Visit | Attending: Urology | Admitting: Urology

## 2023-01-07 ENCOUNTER — Ambulatory Visit
Admission: RE | Admit: 2023-01-07 | Discharge: 2023-01-07 | Disposition: A | Discharge status: Home / Self Care | Payer: Medicare Other | Source: Ambulatory Visit | Attending: Urology | Admitting: Urology

## 2023-01-07 VITALS — Resp 19 | Ht 67.0 in | Wt 294.0 lb

## 2023-01-07 DIAGNOSIS — R9431 Abnormal electrocardiogram [ECG] [EKG]: Secondary | ICD-10-CM | POA: Diagnosis not present

## 2023-01-07 DIAGNOSIS — Z01810 Encounter for preprocedural cardiovascular examination: Secondary | ICD-10-CM | POA: Insufficient documentation

## 2023-01-07 DIAGNOSIS — C61 Malignant neoplasm of prostate: Secondary | ICD-10-CM | POA: Insufficient documentation

## 2023-01-07 DIAGNOSIS — Z01818 Encounter for other preprocedural examination: Secondary | ICD-10-CM

## 2023-01-07 NOTE — Progress Notes (Addendum)
Pre-seed nursing interview for a diagnosis of 67 y.o. gentleman with Stage T1c adenocarcinoma of the prostate with Gleason score of 3+4, and PSA of 6.1.  Patient identity verified. Patient reports moderate polyuria. Also some improving hematuria post biopsy. No other issues conveyed at this time.  Meaningful use complete. Urinary Management medication(s)- None Urology appointment date- 01/2023, with Dr. Berneice Heinrich at [facility] [place]  Resp 19   Ht  (1.702 m)   Wt 294 lb (133.4 kg)   BMI 46.05 kg/m   This concludes the interview.   Ruel Favors, LPN

## 2023-01-12 ENCOUNTER — Ambulatory Visit: Payer: Medicare Other | Admitting: Podiatry

## 2023-01-12 ENCOUNTER — Encounter: Payer: Self-pay | Admitting: Podiatry

## 2023-01-12 VITALS — BP 163/85

## 2023-01-12 DIAGNOSIS — M79675 Pain in left toe(s): Secondary | ICD-10-CM | POA: Diagnosis not present

## 2023-01-12 DIAGNOSIS — B351 Tinea unguium: Secondary | ICD-10-CM

## 2023-01-12 DIAGNOSIS — M79674 Pain in right toe(s): Secondary | ICD-10-CM

## 2023-01-12 NOTE — Progress Notes (Unsigned)
  Subjective:  Patient ID: Christopher Lambert, male    DOB: 11/15/1955,  MRN: [id number]  Christopher Lambert presents to clinic today for {jgcomplaint:23593}  Chief Complaint  Patient presents with   Nail Problem    [facility] BS-do not have to check A1C-do not know PCP-[name] P[facility] VST- 1 month ago   New problem(s): None. {jgcomplaint:23593}  PCP is Kerri Perches, MD.  No Known Allergies  Review of Systems: Negative except as noted in the HPI.  Objective: No changes noted in today's physical examination. Vitals:   01/12/23 0926  BP: (!) 163/85   Christopher Lambert is a pleasant 67 y.o. male {jgbodyhabitus:24098} AAO x 3.  Vascular Examination: Capillary refill time immediate b/l. Vascular status intact b/l with palpable pedal pulses. Pedal hair present b/l. No edema. No pain with calf compression b/l. Skin temperature gradient WNL b/l. No cyanosis or clubbing noted b/l LE.  Neurological Examination: Sensation grossly intact b/l with 10 gram monofilament. Vibratory sensation intact b/l.   Dermatological Examination: Pedal skin with normal turgor, texture and tone b/l.  No open wounds. No interdigital macerations.   Toenails 2-5 b/l thick, discolored, elongated with subungual debris and pain on dorsal palpation.   Incurvated nailplate both borders of left hallux and both borders of right hallux.  Nail border hypertrophy absent. There is tenderness to palpation. Sign(s) of infection: no clinical signs of infection noted on examination today.  Musculoskeletal Examination: Normal muscle strength 5/5 to all lower extremity muscle groups bilaterally. Pes planus deformity noted bilateral LE.Marland Kitchen No pain, crepitus or joint limitation noted with ROM b/l LE.  Patient ambulates independently without assistive aids.  Radiographs: None Assessment/Plan: No diagnosis found.  No orders of the defined types were placed in this encounter.   None {Jgplan:23602::"-Patient/POA to call should there be  question/concern in the interim."}   Return in about 3 months (around 04/13/2023).  Freddie Breech, DPM

## 2023-01-26 ENCOUNTER — Encounter (HOSPITAL_BASED_OUTPATIENT_CLINIC_OR_DEPARTMENT_OTHER): Payer: Self-pay | Admitting: Urology

## 2023-01-26 NOTE — Progress Notes (Signed)
Spoke w/ via phone for pre-op interview--- Fredda Hammed needs dos----  ISTAT             Lab results------Current EKG in Epic dated 01/07/23 COVID test -----patient states asymptomatic no test needed Arrive at -------0800 NPO after MN NO Solid Food.  Clear liquids from MN until---0700 Med rec completed Medications to take morning of surgery -----Procardia Diabetic medication -----NONE AM of surgery Patient instructed no nail polish to be worn day of surgery Patient instructed to bring photo id and insurance card day of surgery Patient aware to have Driver (ride ) / caregiver Wife Donia Pounds   for 24 hours after surgery  Patient Special Instructions ----- Pre-Op special Instructions ----- FLEETS enema night before surgery, pt verbalized understanding. Patient verbalized understanding of instructions that were given at this phone interview. Patient denies shortness of breath, chest pain, fever, cough at this phone interview.

## 2023-01-29 ENCOUNTER — Ambulatory Visit: Payer: Medicare Other | Admitting: Family Medicine

## 2023-01-29 ENCOUNTER — Encounter: Payer: Self-pay | Admitting: Family Medicine

## 2023-01-29 VITALS — BP 132/75 | HR 76 | Ht 67.0 in | Wt 286.0 lb

## 2023-01-29 DIAGNOSIS — Z7984 Long term (current) use of oral hypoglycemic drugs: Secondary | ICD-10-CM

## 2023-01-29 DIAGNOSIS — G4733 Obstructive sleep apnea (adult) (pediatric): Secondary | ICD-10-CM

## 2023-01-29 DIAGNOSIS — C61 Malignant neoplasm of prostate: Secondary | ICD-10-CM

## 2023-01-29 DIAGNOSIS — I10 Essential (primary) hypertension: Secondary | ICD-10-CM

## 2023-01-29 DIAGNOSIS — E1169 Type 2 diabetes mellitus with other specified complication: Secondary | ICD-10-CM

## 2023-01-29 DIAGNOSIS — E785 Hyperlipidemia, unspecified: Secondary | ICD-10-CM

## 2023-01-29 NOTE — Assessment & Plan Note (Signed)
Compliant with treatment 

## 2023-01-29 NOTE — Progress Notes (Signed)
Christopher Lambert     MRN: [id number]      DOB: September 22, 1955  Chief Complaint  Patient presents with   Follow-up    4 monthfollow up    HPI Christopher Lambert is here for follow up and re-evaluation of chronic medical conditions, medication management and review of any available recent lab and radiology data.  Preventive health is updated, specifically  Cancer screening and Immunization.   New dx of prostate caner and is for radioactive seed implantation next week. The PT denies any adverse reactions to current medications since the last visit.  Increased allergy symptoms with excess post nasal drainage, using flonase, ROS Denies recent fever or chills. Denies sinus pressure, nasal congestion, ear pain or sore throat. Denies chest congestion, productive cough or wheezing. Denies chest pains, palpitations and leg swelling Denies abdominal pain, nausea, vomiting,diarrhea or constipation.   Denies dysuria, frequency, hesitancy or incontinence. Denies uncontrolled  joint pain, swelling and limitation in mobility. Denies headaches, seizures, numbness, or tingling. Denies depression, anxiety or insomnia. Denies skin break down or rash.   PE  BP 132/75 (BP Location: Right Arm, Patient Position: Sitting, Cuff Size: Large)   Pulse 76   Ht 5\' 7"  (1.702 m)   Wt 286 lb 0.6 oz (129.7 kg)   SpO2 94%   BMI 44.80 kg/m   Patient alert and oriented and in no cardiopulmonary distress.  HEENT: No facial asymmetry, EOMI,     Neck supple .  Chest: Clear to auscultation bilaterally.  CVS: S1, S2 no murmurs, no S3.Regular rate.  ABD: Soft non tender.   Ext: No edema  MS: Decreased though adequate  ROM spine, shoulders, hips and knees.  Skin: Intact, no ulcerations or rash noted.  Psych: Good eye contact, normal affect. Memory intact not anxious or depressed appearing.  CNS: CN 2-12 intact, power,  normal throughout.no focal deficits noted.   Assessment & Plan  Essential hypertension,  benign Controlled, no change in medication DASH diet and commitment to daily physical activity for a minimum of 30 minutes discussed and encouraged, as a part of hypertension management. The importance of attaining a healthy weight is also discussed.     01/29/2023    8:04 AM 01/26/2023    4:11 PM 01/12/2023    9:26 AM 01/07/2023    9:20 AM 01/04/2023    9:25 AM 12/24/2022    3:56 PM 12/14/2022   10:27 AM  BP/Weight  Systolic BP 132  409  116 130 811  Diastolic BP 75  85  75 78 89  Wt. (Lbs) 286.04 293  294 289 289.4 294.13  BMI 44.8 kg/m2 45.89 kg/m2  46.05 kg/m2 44.6 kg/m2 44.66 kg/m2 45.39 kg/m2       Dyslipidemia, goal LDL below 100 Hyperlipidemia:Low fat diet discussed and encouraged.   Lipid Panel  Lab Results  Component Value Date   CHOL 158 09/30/2022   HDL 47 09/30/2022   LDLCALC 91 09/30/2022   TRIG 108 09/30/2022   CHOLHDL 3.4 09/30/2022     Updated lab needed at/ before next visit.   Morbid obesity (HCC) Deterioprated  Patient re-educated about  the importance of commitment to a  minimum of 150 minutes of exercise per week as able.  The importance of healthy food choices with portion control discussed, as well as eating regularly and within a 12 hour window most days. The need to choose "clean , green" food 50 to 75% of the time is discussed, as well as to  make water the primary drink and set a goal of 64 ounces water daily.       01/29/2023    8:04 AM 01/26/2023    4:11 PM 01/07/2023    9:20 AM  Weight /BMI  Weight 286 lb 0.6 oz 293 lb 294 lb  Height 5\' 7"  (1.702 m) 5\' 7"  (1.702 m) 5\' 7"  (1.702 m)  BMI 44.8 kg/m2 45.89 kg/m2 46.05 kg/m2      Obstructive sleep apnea Compliant with treatment  Malignant neoplasm of prostate (HCC) Radiation treatment scheduled for 02/11/2023

## 2023-01-29 NOTE — Assessment & Plan Note (Signed)
Radiation treatment scheduled for 02/11/2023

## 2023-01-29 NOTE — Assessment & Plan Note (Signed)
Benefits from CPAP with good compliance and control Plan- continue auto 10-20 

## 2023-01-29 NOTE — Assessment & Plan Note (Signed)
Continue efforts with diet/exercise 

## 2023-01-29 NOTE — Assessment & Plan Note (Signed)
Deterioprated  Patient re-educated about  the importance of commitment to a  minimum of 150 minutes of exercise per week as able.  The importance of healthy food choices with portion control discussed, as well as eating regularly and within a 12 hour window most days. The need to choose "clean , green" food 50 to 75% of the time is discussed, as well as to make water the primary drink and set a goal of 64 ounces water daily.       01/29/2023    8:04 AM 01/26/2023    4:11 PM 01/07/2023    9:20 AM  Weight /BMI  Weight 286 lb 0.6 oz 293 lb 294 lb  Height 5\' 7"  (1.702 m) 5\' 7"  (1.702 m) 5\' 7"  (1.702 m)  BMI 44.8 kg/m2 45.89 kg/m2 46.05 kg/m2

## 2023-01-29 NOTE — Assessment & Plan Note (Signed)
Controlled, no change in medication DASH diet and commitment to daily physical activity for a minimum of 30 minutes discussed and encouraged, as a part of hypertension management. The importance of attaining a healthy weight is also discussed.     01/29/2023    8:04 AM 01/26/2023    4:11 PM 01/12/2023    9:26 AM 01/07/2023    9:20 AM 01/04/2023    9:25 AM 12/24/2022    3:56 PM 12/14/2022   10:27 AM  BP/Weight  Systolic BP 132  191  116 130 478  Diastolic BP 75  85  75 78 89  Wt. (Lbs) 286.04 293  294 289 289.4 294.13  BMI 44.8 kg/m2 45.89 kg/m2  46.05 kg/m2 44.6 kg/m2 44.66 kg/m2 45.39 kg/m2

## 2023-01-29 NOTE — Patient Instructions (Addendum)
F/U in end October, call if you need me sooner  Lipid, cmp and EGFr, HBA1C, uric acid level today  HbA1C , chem 7 and eGFR and microalb 3 to  5 days before October visit  It is important that you exercise regularly at least 30 minutes 5 times a week. If you develop chest pain, have severe difficulty breathing, or feel very tired, stop exercising immediately and seek medical attention   Think about what you will eat, plan ahead. Choose " clean, green, fresh or frozen" over canned, processed or packaged foods which are more sugary, salty and fatty. 70 to 75% of food eaten should be vegetables and fruit. Three meals at set times with snacks allowed between meals, but they must be fruit or vegetables. Aim to eat over a 12 hour period , example 7 am to 7 pm, and STOP after  your last meal of the day. Drink water,generally about 64 ounces per day, no other drink is as healthy. Fruit juice is best enjoyed in a healthy way, by EATING the fruit. Thanks for choosing [facility], we consider it a privelige to serve you.

## 2023-01-29 NOTE — Assessment & Plan Note (Signed)
Hyperlipidemia:Low fat diet discussed and encouraged.   Lipid Panel  Lab Results  Component Value Date   CHOL 158 09/30/2022   HDL 47 09/30/2022   LDLCALC 91 09/30/2022   TRIG 108 09/30/2022   CHOLHDL 3.4 09/30/2022     Updated lab needed at/ before next visit.

## 2023-01-30 LAB — HEMOGLOBIN A1C
Est. average glucose Bld gHb Est-mCnc: 140 mg/dL
Hgb A1c MFr Bld: 6.5 % — ABNORMAL HIGH (ref 4.8–5.6)

## 2023-01-30 LAB — CMP14+EGFR
ALT: 24 IU/L (ref 0–44)
AST: 17 IU/L (ref 0–40)
Albumin/Globulin Ratio: 1.6 (ref 1.2–2.2)
Albumin: 4.3 g/dL (ref 3.9–4.9)
Alkaline Phosphatase: 48 IU/L (ref 44–121)
BUN/Creatinine Ratio: 13 (ref 10–24)
BUN: 14 mg/dL (ref 8–27)
Bilirubin Total: 0.7 mg/dL (ref 0.0–1.2)
CO2: 23 mmol/L (ref 20–29)
Calcium: 9.2 mg/dL (ref 8.6–10.2)
Chloride: 103 mmol/L (ref 96–106)
Creatinine, Ser: 1.06 mg/dL (ref 0.76–1.27)
Globulin, Total: 2.7 g/dL (ref 1.5–4.5)
Glucose: 129 mg/dL — ABNORMAL HIGH (ref 70–99)
Potassium: 3.9 mmol/L (ref 3.5–5.2)
Sodium: 143 mmol/L (ref 134–144)
Total Protein: 7 g/dL (ref 6.0–8.5)
eGFR: 78 mL/min/{1.73_m2} (ref >59)

## 2023-01-30 LAB — LIPID PANEL
Chol/HDL Ratio: 3.9 ratio (ref 0.0–5.0)
Cholesterol, Total: 156 mg/dL (ref 100–199)
HDL: 40 mg/dL (ref >39)
LDL Chol Calc (NIH): 76 mg/dL (ref 0–99)
Triglycerides: 240 mg/dL — ABNORMAL HIGH (ref 0–149)
VLDL Cholesterol Cal: 40 mg/dL (ref 5–40)

## 2023-02-01 NOTE — Progress Notes (Signed)
RN spoke with patient to assess any needs or questions prior to his brachytherapy procedure on 5/30.  RN answered all questions regarding distancing post procedure and length of time.  Pt verbalized understanding.  No additional needs at this time.

## 2023-02-10 ENCOUNTER — Telehealth: Payer: Self-pay | Admitting: *Deleted

## 2023-02-10 NOTE — Telephone Encounter (Signed)
CALLED PATIENT TO REMIND OF PROCEDURE FOR 02-11-23, SPOKE WITH PATIENT AND HE IS AWARE OF THIS PROCEDURE

## 2023-02-11 ENCOUNTER — Ambulatory Visit (HOSPITAL_BASED_OUTPATIENT_CLINIC_OR_DEPARTMENT_OTHER): Payer: Medicare Other | Admitting: Certified Registered"

## 2023-02-11 ENCOUNTER — Encounter (HOSPITAL_BASED_OUTPATIENT_CLINIC_OR_DEPARTMENT_OTHER)
Admission: RE | Disposition: A | Discharge status: Home / Self Care | Payer: Self-pay | Source: Ambulatory Visit | Attending: Urology

## 2023-02-11 ENCOUNTER — Other Ambulatory Visit: Payer: Self-pay

## 2023-02-11 ENCOUNTER — Ambulatory Visit (HOSPITAL_BASED_OUTPATIENT_CLINIC_OR_DEPARTMENT_OTHER)
Admission: RE | Admit: 2023-02-11 | Discharge: 2023-02-11 | Disposition: A | Discharge status: Home / Self Care | Payer: Medicare Other | Source: Ambulatory Visit | Attending: Urology | Admitting: Urology

## 2023-02-11 ENCOUNTER — Ambulatory Visit (HOSPITAL_COMMUNITY): Payer: Medicare Other

## 2023-02-11 ENCOUNTER — Encounter (HOSPITAL_BASED_OUTPATIENT_CLINIC_OR_DEPARTMENT_OTHER): Payer: Self-pay | Admitting: Urology

## 2023-02-11 DIAGNOSIS — I10 Essential (primary) hypertension: Secondary | ICD-10-CM

## 2023-02-11 DIAGNOSIS — G4733 Obstructive sleep apnea (adult) (pediatric): Secondary | ICD-10-CM | POA: Diagnosis not present

## 2023-02-11 DIAGNOSIS — C61 Malignant neoplasm of prostate: Secondary | ICD-10-CM | POA: Diagnosis not present

## 2023-02-11 DIAGNOSIS — M1990 Unspecified osteoarthritis, unspecified site: Secondary | ICD-10-CM | POA: Insufficient documentation

## 2023-02-11 DIAGNOSIS — E119 Type 2 diabetes mellitus without complications: Secondary | ICD-10-CM | POA: Diagnosis not present

## 2023-02-11 DIAGNOSIS — Z87891 Personal history of nicotine dependence: Secondary | ICD-10-CM

## 2023-02-11 DIAGNOSIS — E669 Obesity, unspecified: Secondary | ICD-10-CM | POA: Diagnosis not present

## 2023-02-11 DIAGNOSIS — Z79899 Other long term (current) drug therapy: Secondary | ICD-10-CM | POA: Diagnosis not present

## 2023-02-11 DIAGNOSIS — Z01818 Encounter for other preprocedural examination: Secondary | ICD-10-CM

## 2023-02-11 DIAGNOSIS — Z6841 Body Mass Index (BMI) 40.0 and over, adult: Secondary | ICD-10-CM | POA: Diagnosis not present

## 2023-02-11 HISTORY — PX: RADIOACTIVE SEED IMPLANT: SHX5150

## 2023-02-11 HISTORY — DX: Malignant (primary) neoplasm, unspecified: C80.1

## 2023-02-11 HISTORY — PX: SPACE OAR INSTILLATION: SHX6769

## 2023-02-11 LAB — POCT I-STAT, CHEM 8
BUN: 13 mg/dL (ref 8–23)
Calcium, Ion: 1.14 mmol/L — ABNORMAL LOW (ref 1.15–1.40)
Chloride: 103 mmol/L (ref 98–111)
Creatinine, Ser: 0.9 mg/dL (ref 0.61–1.24)
Glucose, Bld: 138 mg/dL — ABNORMAL HIGH (ref 70–99)
HCT: 44 % (ref 39.0–52.0)
Hemoglobin: 15 g/dL (ref 13.0–17.0)
Potassium: 3.1 mmol/L — ABNORMAL LOW (ref 3.5–5.1)
Sodium: 140 mmol/L (ref 135–145)
TCO2: 27 mmol/L (ref 22–32)

## 2023-02-11 LAB — GLUCOSE, CAPILLARY: Glucose-Capillary: 124 mg/dL — ABNORMAL HIGH (ref 70–99)

## 2023-02-11 SURGERY — INSERTION, RADIATION SOURCE, PROSTATE
Anesthesia: General

## 2023-02-11 MED ORDER — IOHEXOL 300 MG/ML  SOLN
INTRAMUSCULAR | Status: DC | PRN
  Administered 2023-02-11: 7 mL

## 2023-02-11 MED ORDER — STERILE WATER FOR IRRIGATION IR SOLN
Status: DC | PRN
  Administered 2023-02-11: 500 mL

## 2023-02-11 MED ORDER — ROCURONIUM BROMIDE 10 MG/ML (PF) SYRINGE
PREFILLED_SYRINGE | INTRAVENOUS | Status: AC
  Filled 2023-02-11: qty 10

## 2023-02-11 MED ORDER — MEPERIDINE HCL 25 MG/ML IJ SOLN: 6.2500 mg | INTRAMUSCULAR | Status: DC | PRN

## 2023-02-11 MED ORDER — MIDAZOLAM HCL 5 MG/5ML IJ SOLN
INTRAMUSCULAR | Status: DC | PRN
  Administered 2023-02-11: 2 mg via INTRAVENOUS

## 2023-02-11 MED ORDER — CEFAZOLIN SODIUM-DEXTROSE 2-4 GM/100ML-% IV SOLN
INTRAVENOUS | Status: AC
  Filled 2023-02-11: qty 100

## 2023-02-11 MED ORDER — DEXAMETHASONE SODIUM PHOSPHATE 10 MG/ML IJ SOLN
INTRAMUSCULAR | Status: DC | PRN
  Administered 2023-02-11: 10 mg via INTRAVENOUS

## 2023-02-11 MED ORDER — SODIUM CHLORIDE (PF) 0.9 % IJ SOLN
INTRAMUSCULAR | Status: DC | PRN
  Administered 2023-02-11: 10 mL

## 2023-02-11 MED ORDER — OXYCODONE HCL 5 MG PO TABS: 5.0000 mg | ORAL_TABLET | Freq: Once | ORAL | Status: DC | PRN

## 2023-02-11 MED ORDER — CEFAZOLIN SODIUM-DEXTROSE 2-4 GM/100ML-% IV SOLN
2.0000 g | Freq: Three times a day (TID) | INTRAVENOUS | Status: DC
  Administered 2023-02-11: 2 g via INTRAVENOUS

## 2023-02-11 MED ORDER — LIDOCAINE HCL (PF) 2 % IJ SOLN
INTRAMUSCULAR | Status: AC
  Filled 2023-02-11: qty 5

## 2023-02-11 MED ORDER — DEXAMETHASONE SODIUM PHOSPHATE 10 MG/ML IJ SOLN
INTRAMUSCULAR | Status: AC
  Filled 2023-02-11: qty 1

## 2023-02-11 MED ORDER — OXYCODONE HCL 5 MG/5ML PO SOLN: 5.0000 mg | Freq: Once | ORAL | Status: DC | PRN

## 2023-02-11 MED ORDER — FENTANYL CITRATE (PF) 100 MCG/2ML IJ SOLN
INTRAMUSCULAR | Status: DC | PRN
  Administered 2023-02-11 (×2): 50 ug via INTRAVENOUS

## 2023-02-11 MED ORDER — SUGAMMADEX SODIUM 200 MG/2ML IV SOLN
INTRAVENOUS | Status: DC | PRN
  Administered 2023-02-11: 275 mg via INTRAVENOUS

## 2023-02-11 MED ORDER — PROMETHAZINE HCL 25 MG/ML IJ SOLN: 6.2500 mg | INTRAMUSCULAR | Status: DC | PRN

## 2023-02-11 MED ORDER — PROPOFOL 10 MG/ML IV BOLUS
INTRAVENOUS | Status: DC | PRN
  Administered 2023-02-11: 180 mg via INTRAVENOUS

## 2023-02-11 MED ORDER — ONDANSETRON HCL 4 MG/2ML IJ SOLN
INTRAMUSCULAR | Status: AC
  Filled 2023-02-11: qty 2

## 2023-02-11 MED ORDER — TRAMADOL HCL 50 MG PO TABS: 50.0000 mg | ORAL_TABLET | Freq: Four times a day (QID) | ORAL | 0 refills | Status: DC | PRN

## 2023-02-11 MED ORDER — ONDANSETRON HCL 4 MG/2ML IJ SOLN
INTRAMUSCULAR | Status: DC | PRN
  Administered 2023-02-11: 4 mg via INTRAVENOUS

## 2023-02-11 MED ORDER — FLEET ENEMA 7-19 GM/118ML RE ENEM: 1.0000 | ENEMA | Freq: Once | RECTAL | Status: DC

## 2023-02-11 MED ORDER — LIDOCAINE 2% (20 MG/ML) 5 ML SYRINGE
INTRAMUSCULAR | Status: DC | PRN
  Administered 2023-02-11: 80 mg via INTRAVENOUS

## 2023-02-11 MED ORDER — MIDAZOLAM HCL 2 MG/2ML IJ SOLN
INTRAMUSCULAR | Status: AC
  Filled 2023-02-11: qty 2

## 2023-02-11 MED ORDER — AMISULPRIDE (ANTIEMETIC) 5 MG/2ML IV SOLN: 10.0000 mg | Freq: Once | INTRAVENOUS | Status: DC | PRN

## 2023-02-11 MED ORDER — FENTANYL CITRATE (PF) 100 MCG/2ML IJ SOLN
INTRAMUSCULAR | Status: AC
  Filled 2023-02-11: qty 2

## 2023-02-11 MED ORDER — DEXMEDETOMIDINE HCL IN NACL 80 MCG/20ML IV SOLN
INTRAVENOUS | Status: DC | PRN
  Administered 2023-02-11: 8 ug via INTRAVENOUS

## 2023-02-11 MED ORDER — ROCURONIUM BROMIDE 10 MG/ML (PF) SYRINGE
PREFILLED_SYRINGE | INTRAVENOUS | Status: DC | PRN
  Administered 2023-02-11: 70 mg via INTRAVENOUS

## 2023-02-11 MED ORDER — LACTATED RINGERS IV SOLN
INTRAVENOUS | Status: DC
  Administered 2023-02-11: 1000 mL via INTRAVENOUS

## 2023-02-11 MED ORDER — HYDROMORPHONE HCL 1 MG/ML IJ SOLN: 0.2500 mg | INTRAMUSCULAR | Status: DC | PRN

## 2023-02-11 MED ORDER — PROPOFOL 10 MG/ML IV BOLUS
INTRAVENOUS | Status: AC
  Filled 2023-02-11: qty 20

## 2023-02-11 MED ORDER — SODIUM CHLORIDE 0.9 % IR SOLN
Status: DC | PRN
  Administered 2023-02-11: 1000 mL via INTRAVESICAL

## 2023-02-11 SURGICAL SUPPLY — 45 items
BAG DRN RND TRDRP ANRFLXCHMBR (UROLOGICAL SUPPLIES) ×1
BAG URINE DRAIN 2000ML AR STRL (UROLOGICAL SUPPLIES) ×1 IMPLANT
BLADE CLIPPER SENSICLIP SURGIC (BLADE) ×1 IMPLANT
Bard Quicklink seeds IMPLANT
CATH FOLEY 2WAY SLVR  5CC 16FR (CATHETERS) ×2
CATH FOLEY 2WAY SLVR 5CC 16FR (CATHETERS) ×2 IMPLANT
CATH ROBINSON RED A/P 20FR (CATHETERS) ×1 IMPLANT
CLOTH BEACON ORANGE TIMEOUT ST (SAFETY) ×1 IMPLANT
CNTNR URN SCR LID CUP LEK RST (MISCELLANEOUS) ×1 IMPLANT
CONT SPEC 4OZ STRL OR WHT (MISCELLANEOUS) ×1
COVER BACK TABLE 60X90IN (DRAPES) ×1 IMPLANT
COVER MAYO STAND STRL (DRAPES) ×1 IMPLANT
DRSG TEGADERM 4X4.75 (GAUZE/BANDAGES/DRESSINGS) ×1 IMPLANT
DRSG TEGADERM 8X12 (GAUZE/BANDAGES/DRESSINGS) ×1 IMPLANT
GAUZE SPONGE 4X4 3PLY NS LF (GAUZE/BANDAGES/DRESSINGS) IMPLANT
GLOVE BIO SURGEON STRL SZ 6.5 (GLOVE) IMPLANT
GLOVE BIO SURGEON STRL SZ7.5 (GLOVE) IMPLANT
GLOVE BIO SURGEON STRL SZ8 (GLOVE) ×1 IMPLANT
GLOVE BIOGEL PI IND STRL 6.5 (GLOVE) IMPLANT
GLOVE SURG ORTHO 8.5 STRL (GLOVE) ×1 IMPLANT
GOWN STRL REUS W/TWL XL LVL3 (GOWN DISPOSABLE) ×1 IMPLANT
GRID BRACH TEMP 18GA 2.8X3X.75 (MISCELLANEOUS) ×1 IMPLANT
HOLDER FOLEY CATH W/STRAP (MISCELLANEOUS) ×1 IMPLANT
IMPL SPACEOAR VUE SYSTEM (Spacer) ×1 IMPLANT
IMPLANT SPACEOAR VUE SYSTEM (Spacer) ×1 IMPLANT
IV NS 1000ML (IV SOLUTION) ×1
IV NS 1000ML BAXH (IV SOLUTION) ×1 IMPLANT
KIT TURNOVER CYSTO (KITS) ×1 IMPLANT
MANIFOLD NEPTUNE II (INSTRUMENTS) IMPLANT
NDL BRACHY 18G 5PK (NEEDLE) ×4 IMPLANT
NDL BRACHY 18G SINGLE (NEEDLE) IMPLANT
NDL PK MORGANSTERN STABILIZ (NEEDLE) ×1 IMPLANT
NEEDLE BRACHY 18G 5PK (NEEDLE) ×4 IMPLANT
NEEDLE BRACHY 18G SINGLE (NEEDLE) IMPLANT
NEEDLE PK MORGANSTERN STABILIZ (NEEDLE) ×1 IMPLANT
PACK CYSTO (CUSTOM PROCEDURE TRAY) ×1 IMPLANT
SHEATH ULTRASOUND LF (SHEATH) IMPLANT
SHEATH ULTRASOUND LTX NONSTRL (SHEATH) IMPLANT
SLEEVE SCD COMPRESS KNEE MED (STOCKING) ×1 IMPLANT
SYR 10ML LL (SYRINGE) ×1 IMPLANT
SYR CONTROL 10ML LL (SYRINGE) ×1 IMPLANT
TOWEL OR 17X24 6PK STRL BLUE (TOWEL DISPOSABLE) ×1 IMPLANT
UNDERPAD 30X36 HEAVY ABSORB (UNDERPADS AND DIAPERS) ×2 IMPLANT
WATER STERILE IRR 3000ML UROMA (IV SOLUTION) ×1 IMPLANT
WATER STERILE IRR 500ML POUR (IV SOLUTION) ×1 IMPLANT

## 2023-02-11 NOTE — Op Note (Signed)
PRE-OPERATIVE DIAGNOSIS:  Adenocarcinoma of the prostate  POST-OPERATIVE DIAGNOSIS:  Same  PROCEDURE:  Procedure(s): 1. I-125 radioactive seed implantation 2. Cystoscopy 3. SpaceOAR placement  SURGEON:  Surgeon(s): Wilkie Aye, MD  Radiation oncologist: Margaretmary Dys, MD  ANESTHESIA:  General  EBL:  Minimal  DRAINS: 16 French Foley catheter  INDICATION: Christopher Lambert is a 67 year old with a history of T1c prostate cancer. After discussing treatment options he has elected to proceed with brachytherapy  Description of procedure: After informed consent the patient was brought to the major OR, placed on the table and administered general anesthesia. He was then moved to the modified lithotomy position with his perineum perpendicular to the floor. His perineum and genitalia were then sterilely prepped. An official timeout was then performed. A 16 French Foley catheter was then placed in the bladder and filled with dilute contrast, a rectal tube was placed in the rectum and the transrectal ultrasound probe was placed in the rectum and affixed to the stand. He was then sterilely draped.  Real time ultrasonography was used along with the seed planning software [employer] vs. 4.2.21. This was used to develop the seed plan including the number of needles as well as number of seeds required for complete and adequate coverage. Real-time ultrasonography was then used along with the previously developed plan and the Nucletron device to implant a total of 78 seeds using 16 needles. This proceeded without difficulty or complication.  We then proceeded to mix the SpaceOAR using the kit supplied from the manufacturer. Once this was complete we placed a sinal needle into the perirectal fat between the rectum and the prostate. Once this was accomplished we injected 2cc of normal saline to hydrodissect the plain. We then instilled the the SpaceOAR through the spinal needle and noted good  distribution in the perirectal fat.    A Foley catheter was then removed as well as the transrectal ultrasound probe and rectal probe. Flexible cystoscopy was then performed using the 17 French flexible scope which revealed a normal urethra throughout its length down to the sphincter which appeared intact. The prostatic urethra revealed bilobar hypertrophy but no evidence of obstruction, seeds, spacers or lesions. The bladder was then entered and fully and systematically inspected. The ureteral orifices were noted to be of normal configuration and position. The mucosa revealed no evidence of tumors. There were also no stones identified within the bladder. I noted no seeds or spacers on the floor of the bladder and retroflexion of the scope revealed no seeds protruding from the base of the prostate.  The cystoscope was then removed and a new 16 French Foley catheter was then inserted and the balloon was filled with 10 cc of sterile water. This was connected to closed system drainage and the patient was awakened and taken to recovery room in stable and satisfactory condition. He tolerated procedure well and there were no intraoperative complications.

## 2023-02-11 NOTE — Anesthesia Procedure Notes (Signed)
Procedure Name: Intubation Date/Time: 02/11/2023 10:37 AM  Performed by: Izac Faulkenberry D, CRNAPre-anesthesia Checklist: Patient identified, Emergency Drugs available, Suction available and Patient being monitored Patient Re-evaluated:Patient Re-evaluated prior to induction Oxygen Delivery Method: Circle system utilized Preoxygenation: Pre-oxygenation with 100% oxygen Induction Type: IV induction Ventilation: Mask ventilation without difficulty Laryngoscope Size: Mac and 4 Grade View: Grade I Tube type: Oral Tube size: 7.5 mm Number of attempts: 1 Airway Equipment and Method: Stylet Placement Confirmation: ETT inserted through vocal cords under direct vision, positive ETCO2 and breath sounds checked- equal and bilateral Secured at: 22 cm Tube secured with: Tape Dental Injury: Teeth and Oropharynx as per pre-operative assessment

## 2023-02-11 NOTE — Discharge Instructions (Signed)
Radioactive Seed Implant Home Care Instructions   Activity:    Rest for the remainder of the day.  Do not drive or operate equipment today.  You may resume normal activities in a few days as instructed by your physician, without risk of harmful radiation exposure to those around you, provided you follow the time and distance precautions on the Radiation Oncology Instruction Sheet.  Meals: Drink plenty of lipuids and eat light foods, such as gelatin or soup this evening .  You may return to normal meal plan tomorrow.  Return To Work: You may return to work as instructed by your physician.  Special Instruction:   If any seeds are found, use tweezers to pick up seeds and place in a glass container of any kind and bring to your physician's office.  Call your physician if any of these symptoms occur:   Persistent or heavy bleeding  Urine stream diminishes or stops completely after catheter is removed  Fever equal to or greater than 101 degrees F  Cloudy urine with a strong foul odor  Severe pain  You may feel some burning pain and/or hesitancy when you urinate after the catheter is removed.  These symptoms may increase over the next few weeks, but should diminish within forur to six weeks.  Applying moist heat to the lower abdomen or a hot tub bath may help relieve the pain.  If the discomfort becomes severe, please call your physician for additional medications.  PROSTATE CANCER TREATMENT WITH RADIOACTIVE IODINE-125 SEED IMPLANT  This instruction sheet is intended to discuss implantation of Iodine-125 seeds as treatment for cancer of the prostate. It will explain in detail what you may expect from this treatment and what precautions are necessary as a result of the treatment. Iodine-125 emits a relatively low energy radiation. The radioactive seeds are surgically implanted directly into the prostate gland. Most of the radiation is contained within the prostate gland. A very small amount  is present outside the body.The precautions that we ask you to take are to ensure that those around you are protected from unnecessary radiation. The principles of radiation safety that you need to understand are:  DISTANCE: The further a person is from the radioactive implant the less radiation they will be receiving. The amount of radiation received falls off quite rapidly with distance. More specific guidelines are given in the table on the last page.  TIME: The amount of radiation a person is exposed to is directly proportional to the amount of time that is spent in close proximity to the radioactive implant. Very little radiation will be received during short periods. See the table on the last page for more specific guideline.  CHILDREN UNDER AGE 18 Children should not be allowed to sit on your lap or otherwise be in very close contact for more than a few minutes for the first 6-8 weeks following the implant. You may affectionately greet (hug/kiss) a child for a short period of time, but remember, the longer you are in close proximity with that child the more radiation they are being exposed to. At a distance of 6 feet there is no limit to the length of time you may spend together. See specific guidelines on the last page.  PREGNANT OR POSSIBLY PREGNANT WOMEN Pregnant women should avoid prolonged close physical contact with you for the first 6-8 weeks after implant. At a distance of 6 feet there is no limit to the length of time you may spend together. Pregnant women   or possibly pregnant women can safely be in close contact with you for a limited period of time. See the last page for guidelines.  FAMILY RELATIONS You may sleep in the same bed as your partner (provided she is not pregnant or under the age of 45). Sexual intercourse, using a condom, may be resumed 2 weeks after the implant. Your semen may be discolored, dark brown or black. This is normal and is the result of bleeding that may have  occurred during the implant. After 3-4 weeks it will not be necessary to use a condom.  DAILY ACTIVITIES You may resume normal activities in a few days (example: work, shopping, church) without the risk of harmful radiation exposure to those around you provided you keep in mind the time and distance precautions. Objects that you touch or item that you use do not become radioactive. Linens, clothing, tableware, and dishes may be used by other persons without special precautions. Your bodily wastes (urine and stool) are not radioactive.  SPECIAL PRECAUTIONS It is possible to lose implanted Iodine-125 seed(s) through urination. Although it is possible to pass seeds indefinitely, it is most likely to occur immediately after catheter removal. To prevent this from happening the catheter that was in place during the implant procedure is removed immediately after the implant and a cystoscopy procedure is performed. The process of removing the catheter and the cystoscopy procedure should dislodge and remove any seeds that are not firmly imbedded in the prostate tissue. However, you should watch for seeds if/when you remove your catheter at home. The seeds are silver colored and the size of a grain of rice. In the unlikely event that a seed is seen after urination, simply flush the seed down the toilet. The seed should not be handled with your fingers, not even with a glove or napkin. A spoon or tweezers can be used to pick up a seed. The Radiation Oncology department is open Monday - Friday from 8:00 am to 5:30 pm with a Radiation Oncologist on call at all times. He or she may be reached by calling [phone]. If you are to be hospitalized or if death should occur, your family should notify the Radiation Safety officer.  SIDE EFFECTS There are very few side effects associate with the implant procedure. Minor burning with urination, weak stream, hesitancy, intermittency, frequency, mild pain or feeling unable to  pass your urine freely are common and usually stop in one to four months. If these symptoms are extremely uncomfortable, contact your physician.  RADIATION SAFETY GUIDELINES PROSTATE CANCER TREATMENT WITH RADIOACTIVE IODINE-125 SEED IMPLANT  The following guidelines will limit exposure to less than naturally occurring background radiation.  PERSONS AGE 18-45 (if able to become pregnant)  FOR 8 WEEKS FOLLOWING IMPLANT  At a distance of 1 foot: limit time to less than 2 hours/week At a distance of 3 feet: limit time to 20 hours/week At a distance of 6 feet: no restrictions  AFTER 8 WEEKS No restrictions  CHILDREN UNDER AGE 18, PREGNANT WOMEN OR POSSIBLY PREGNANT WOMEN  FOR 8 WEEKS FOLLOWING IMPLANT At a distance of 1 foot: limit time to 10 minutes/week At a distance of 3 feet: limit time to 2 hours/week At a distance of 6 feet: no restrictions  AFTER 8 WEEKS No restrictions  PERSONS OVER THE AGE OF 45 AND DO NOT EXPECT TO HAVE ANY MORE CHILDREN No restrictions  Updated by [facility] in January 2020   Post Anesthesia Home Care Instructions  Activity: Get   plenty of rest for the remainder of the day. A responsible individual must stay with you for 24 hours following the procedure.  For the next 24 hours, DO NOT: -Drive a car -Operate machinery -Drink alcoholic beverages -Take any medication unless instructed by your physician -Make any legal decisions or sign important papers.  Meals: Start with liquid foods such as gelatin or soup. Progress to regular foods as tolerated. Avoid greasy, spicy, heavy foods. If nausea and/or vomiting occur, drink only clear liquids until the nausea and/or vomiting subsides. Call your physician if vomiting continues.  Special Instructions/Symptoms: Your throat may feel dry or sore from the anesthesia or the breathing tube placed in your throat during surgery. If this causes discomfort, gargle with warm salt water. The discomfort should disappear  within 24 hours.    

## 2023-02-11 NOTE — Progress Notes (Signed)
Radiation Oncology         [phone] ________________________________  Name: Christopher Lambert MRN: [id number]  Date: 02/11/2023  DOB: [birth year]       Prostate Seed Implant  Christopher Lambert, Christopher Mallick, MD  No ref. provider found  DIAGNOSIS:  67 y.o. gentleman with Stage T1c adenocarcinoma of the prostate with Gleason score of 3+4, and PSA of 6.1.   Oncology History  Malignant neoplasm of prostate (HCC)  11/09/2022 Cancer Staging   Staging form: Prostate, AJCC 8th Edition - Clinical stage from 11/09/2022: Stage IIB (cT1c, cN0, cM0, PSA: 6.1, Grade Group: 2) - Signed by Marcello Fennel, PA-C on 01/07/2023 Histopathologic type: Adenocarcinoma, NOS Stage prefix: Initial diagnosis Prostate specific antigen (PSA) range: Less than 10 Gleason primary pattern: 3 Gleason secondary pattern: 4 Gleason score: 7 Histologic grading system: 5 grade system Number of biopsy cores examined: 12 Number of biopsy cores positive: 11 Location of positive needle core biopsies: Both sides   12/14/2022 Initial Diagnosis   Malignant neoplasm of prostate       ICD-10-CM   1. Pre-op testing  Z01.818 CBG per Guidelines for Diabetes Management for Patients Undergoing Surgery ([facility], [facility], and [facility] only)    CBG per protocol    I-Stat, Chem 8 on day of surgery per protocol    CBG per Guidelines for Diabetes Management for Patients Undergoing Surgery ([facility], [facility], and [facility] only)    CBG per protocol    I-Stat, Chem 8 on day of surgery per protocol    CANCELED: EKG 12 lead per protocol      PROCEDURE: Insertion of radioactive I-125 seeds into the prostate gland.  RADIATION DOSE: 145 Gy, definitive therapy.  TECHNIQUE: Christopher Lambert was brought to the operating room with the urologist. He was placed in the dorsolithotomy position. He was catheterized and a rectal tube was inserted. The perineum was shaved, prepped and draped. The ultrasound probe was then introduced by me into the rectum to see the prostate gland.  TREATMENT  DEVICE: I attached the needle grid to the ultrasound probe stand and anchor needles were placed.  3D PLANNING: The prostate was imaged in 3D using a sagittal sweep of the prostate probe. These images were transferred to the planning computer. There, the prostate, urethra and rectum were defined on each axial reconstructed image. Then, the software created an optimized 3D plan and a few seed positions were adjusted. The quality of the plan was reviewed using Christopher Lambert information for the target and the following two organs at risk:  Urethra and Rectum.  Then the accepted plan was printed and handed off to the radiation therapist.  Under my supervision, the custom loading of the seeds and spacers was carried out using the quick loader.  These pre-loaded needles were then placed into the needle holder.Marland Kitchen  PROSTATE VOLUME STUDY:  Using transrectal ultrasound the volume of the prostate was verified to be 48.4 cc.  SPECIAL TREATMENT PROCEDURE/SUPERVISION AND HANDLING: The pre-loaded needles were then delivered by the urologist under sagittal guidance. A total of 16 needles were used to deposit 78 seeds in the prostate gland. The individual seed activity was 0.434 mCi.  SpaceOAR:  Yes  COMPLEX SIMULATION: At the end of the procedure, an anterior radiograph of the pelvis was obtained to document seed positioning and count. Cystoscopy was performed by the urologist to check the urethra and bladder.  MICRODOSIMETRY: At the end of the procedure, the patient was emitting 0.10 mR/hr at 1 meter. Accordingly, he  was considered safe for hospital discharge.  PLAN: The patient will return to the radiation oncology clinic for post implant CT dosimetry in three weeks.   ________________________________  Christopher Lambert, M.D.

## 2023-02-11 NOTE — Transfer of Care (Signed)
Immediate Anesthesia Transfer of Care Note  Patient: Christopher Lambert  Procedure(s) Performed: RADIOACTIVE SEED IMPLANT/BRACHYTHERAPY IMPLANT SPACE OAR INSTILLATION  Patient Location: PACU  Anesthesia Type:General  Level of Consciousness: awake, alert , and oriented  Airway & Oxygen Therapy: Patient Spontanous Breathing and Patient connected to nasal cannula oxygen  Post-op Assessment: Report given to RN and Post -op Vital signs reviewed and stable  Post vital signs: Reviewed and stable  Last Vitals:  Vitals Value Taken Time  BP 115/81 02/11/23 1147  Temp    Pulse 81 02/11/23 1152  Resp 14 02/11/23 1152  SpO2 100 % 02/11/23 1152  Vitals shown include unvalidated device data.  Last Pain:  Vitals:   02/11/23 0834  TempSrc: Oral  PainSc: 0-No pain      Patients Stated Pain Goal: 7 (02/11/23 0834)  Complications: No notable events documented.

## 2023-02-11 NOTE — Anesthesia Preprocedure Evaluation (Signed)
Anesthesia Evaluation  Patient identified by MRN, date of birth, ID band Patient awake    Reviewed: Allergy & Precautions, H&P , NPO status , Patient's Chart, lab work & pertinent test results  Airway Mallampati: II       Dental no notable dental hx.    Pulmonary shortness of breath and with exertion, sleep apnea and Continuous Positive Airway Pressure Ventilation , former smoker   breath sounds clear to auscultation       Cardiovascular hypertension, Pt. on medications  Rhythm:Regular Rate:Normal     Neuro/Psych    GI/Hepatic negative GI ROS, Neg liver ROS,,,  Endo/Other  diabetes, Type 2    Renal/GU negative Renal ROS     Musculoskeletal  (+) Arthritis , Osteoarthritis,    Abdominal  (+) + obese  Peds  Hematology negative hematology ROS (+)   Anesthesia Other Findings   Reproductive/Obstetrics                             Anesthesia Physical Anesthesia Plan  ASA: III  Anesthesia Plan: General   Post-op Pain Management:    Induction: Intravenous  PONV Risk Score and Plan: 2 and Ondansetron, Midazolam and Treatment may vary due to age or medical condition  Airway Management Planned: Oral ETT and LMA  Additional Equipment:   Intra-op Plan:   Post-operative Plan: Extubation in OR  Informed Consent:      Dental advisory given  Plan Discussed with: CRNA, Anesthesiologist and Surgeon  Anesthesia Plan Comments:         Anesthesia Quick Evaluation

## 2023-02-11 NOTE — Anesthesia Procedure Notes (Deleted)
Procedure Name: Intubation Date/Time: 02/11/2023 1:07 PM  Performed by: Brendin Situ D, CRNAPre-anesthesia Checklist: Patient identified, Emergency Drugs available, Suction available and Patient being monitored Patient Re-evaluated:Patient Re-evaluated prior to induction Oxygen Delivery Method: Circle system utilized Preoxygenation: Pre-oxygenation with 100% oxygen Induction Type: IV induction Ventilation: Mask ventilation without difficulty Laryngoscope Size: Mac and 4 Grade View: Grade I Tube type: Oral Tube size: 7.5 mm Number of attempts: 1 Airway Equipment and Method: Stylet and Oral airway Placement Confirmation: ETT inserted through vocal cords under direct vision, positive ETCO2 and breath sounds checked- equal and bilateral Tube secured with: Tape Dental Injury: Teeth and Oropharynx as per pre-operative assessment

## 2023-02-11 NOTE — H&P (Signed)
HPI: Christopher Lambert is a 66yo here for followup for prostate cancer. He met with Dr. Kathrynn Running and has elected to proceed with brachytherapy. He has mild LUTS. He denies erectile dysfunction. No other complaints today     PMH:     Past Medical History:  Diagnosis Date   Abnormal cardiovascular function study 09/21/2011   Arthritis of knee      Bilateral   Diabetes mellitus 06/2008    type 2 NIDDM x 7 yrs; off meds   Diabetes mellitus without complication      [employer] 08/18/2020   ED (erectile dysfunction)     Essential hypertension, benign 1990   H/O: whooping cough      [facility]child   Hemorrhoids, internal     Hypertension      [employer] 08/18/2020   Metabolic syndrome 01/23/2012   Obesity     Obstructive sleep apnea 11/09/2007    NPSG 08/26/04 AHI 92/hr, [place], [place] NPSG 01/31/15- severe OSA AHI 49.4/ hr, CPAP suggested 13, AHI 0.0, weight 294 lbs CPAP Advanced     Palpitations 08/24/2011   Piles (hemorrhoids) 06/19/2014   Seasonal allergies     Sleep apnea 2004    wears CPAP nightly        Surgical History:      Past Surgical History:  Procedure Laterality Date   CARDIAC CATHETERIZATION   09/25/2011   CARPAL TUNNEL RELEASE        Right    COLONOSCOPY N/A 03/23/2018    Procedure: COLONOSCOPY;  Surgeon: Corbin Ade, MD;  [facility];  Service: Endoscopy;  Laterality: N/A;  8:00   ENDOVENOUS ABLATION SAPHENOUS VEIN W/ LASER Right 10/10/2020    endovenous laser ablation right greater saphenous vein by Cari Caraway MD    HERNIA REPAIR   2006 approx    umbilical   JOINT REPLACEMENT   2011    right knee   JOINT REPLACEMENT   2013    left knee   left knee replacement    sept 2013   POLYPECTOMY   03/23/2018    Procedure: POLYPECTOMY;  Surgeon: Corbin Ade, MD;  Location: [facility];  Service: Endoscopy;;  colon   Right knee replacement   06/2010   TOTAL KNEE ARTHROPLASTY   05/18/2012    Procedure: TOTAL KNEE ARTHROPLASTY;  Surgeon: Loreta Ave, MD;  Location: [facility] OR;   Service: Orthopedics;  Laterality: Left;  left total knee arthroplasty   UMBILICAL HERNIA REPAIR          Home Medications:  Allergies as of 01/04/2023   No Known Allergies         Medication List           Accurate as of January 04, 2023  9:56 AM. If you have any questions, ask your nurse or doctor.              allopurinol 300 MG tablet Commonly known as: ZYLOPRIM TAKE 1 TABLET BY MOUTH EVERY DAY    aspirin EC 81 MG tablet TAKE 1 TABLET EVERY DAY    cholecalciferol 1000 units tablet Commonly known as: VITAMIN D Take 1,000 Units by mouth daily.    ELDERBERRY PO Take 1 tablet by mouth daily.    fluticasone 50 MCG/ACT nasal spray Commonly known as: FLONASE PLACE 2 SPRAYS INTO BOTH NOSTRILS 2 (TWO) TIMES DAILY    furosemide 20 MG tablet Commonly known as: LASIX TAKE 1 TABLET BY MOUTH EVERY DAY  lisinopril-hydrochlorothiazide 20-25 MG tablet Commonly known as: ZESTORETIC Take by mouth.    losartan 25 MG tablet Commonly known as: COZAAR TAKE 1 TABLET BY MOUTH EVERY DAY    meloxicam 15 MG tablet Commonly known as: MOBIC TAKE 1 TABLET (15 MG TOTAL) BY MOUTH DAILY.    metFORMIN 500 MG tablet Commonly known as: GLUCOPHAGE Take 1 tablet (500 mg total) by mouth daily with breakfast.    multivitamin with minerals Tabs tablet Take 1 tablet by mouth daily.    NIFEdipine 90 MG 24 hr tablet Commonly known as: PROCARDIA XL/NIFEDICAL-XL TAKE 1 TABLET BY MOUTH EVERY DAY    potassium chloride 10 MEQ tablet Commonly known as: KLOR-CON TAKE 2 TABLETS BY MOUTH EVERY DAY    simvastatin 5 MG tablet Commonly known as: ZOCOR TAKE 1 TABLET BY MOUTH EVERYDAY AT BEDTIME             Allergies: No Known Allergies   Family History:      Family History  Problem Relation Age of Onset   Coronary artery disease Brother 59        Premature   Hypertension Brother     Hypertension Father     Cancer Father 41        colon    Hypertension Mother     Diabetes Brother      Hypertension Sister     Hypertension Sister     Hypertension Sister     Hypertension Sister        Social History:  reports that he has quit smoking. His smoking use included cigarettes. He smoked an average of .5 packs per day. He has never used smokeless tobacco. He reports that he does not drink alcohol and does not use drugs.   ROS: All other review of systems were reviewed and are negative except what is noted above in HPI   Physical Exam: BP 116/75   Pulse 62   Ht 5' 7.5" (1.715 m)   Wt 289 lb (131.1 kg)   BMI 44.60 kg/m   Constitutional:  Alert and oriented, No acute distress. HEENT: [place] AT, moist mucus membranes.  Trachea midline, no masses. Cardiovascular: No clubbing, cyanosis, or edema. Respiratory: Normal respiratory effort, no increased work of breathing. GI: Abdomen is soft, nontender, nondistended, no abdominal masses GU: No CVA tenderness.  Lymph: No cervical or inguinal lymphadenopathy. Skin: No rashes, bruises or suspicious lesions. Neurologic: Grossly intact, no focal deficits, moving all 4 extremities. Psychiatric: Normal mood and affect.   Laboratory Data: Recent Labs       Lab Results  Component Value Date    WBC 4.3 09/30/2022    HGB 16.1 09/30/2022    HCT 46.6 09/30/2022    MCV 91 09/30/2022    PLT 219 09/30/2022        Recent Labs       Lab Results  Component Value Date    CREATININE 0.86 09/30/2022        Recent Labs       Lab Results  Component Value Date    PSA 2.7 11/30/2019    PSA 4.5 (H) 04/11/2019    PSA 3.0 09/20/2017        Recent Labs  No results found for: "TESTOSTERONE"     Recent Labs       Lab Results  Component Value Date    HGBA1C 6.4 (H) 09/30/2022        Urinalysis Labs (Brief)  Component Value Date/Time    COLORURINE AMBER (A) 04/16/2019 1642    APPEARANCEUR Clear 10/14/2022 1414    LABSPEC 1.019 04/16/2019 1642    PHURINE 5.0 04/16/2019 1642    GLUCOSEU Negative 10/14/2022 1414     HGBUR LARGE (A) 04/16/2019 1642    BILIRUBINUR Negative 10/14/2022 1414    KETONESUR NEGATIVE 04/16/2019 1642    PROTEINUR Negative 10/14/2022 1414    PROTEINUR 30 (A) 04/16/2019 1642    UROBILINOGEN 1.0 06/22/2016 1310    UROBILINOGEN 0.2 05/12/2012 0901    NITRITE Negative 10/14/2022 1414    NITRITE NEGATIVE 04/16/2019 1642    LEUKOCYTESUR Negative 10/14/2022 1414    LEUKOCYTESUR MODERATE (A) 04/16/2019 1642        Recent Labs       Lab Results  Component Value Date    LABMICR See below: 10/14/2022    WBCUA 0-5 10/14/2022    LABEPIT 0-10 10/14/2022    MUCUS Present 04/14/2022    BACTERIA None seen 10/14/2022        Pertinent Imaging:   No results found for this or any previous visit.   No results found for this or any previous visit.   No results found for this or any previous visit.   No results found for this or any previous visit.   No results found for this or any previous visit.   No valid procedures specified. No results found for this or any previous visit.   Results for orders placed during the hospital encounter of 04/16/19   CT RENAL STONE STUDY   Narrative CLINICAL DATA:  Weakness fever body aches   EXAM: CT ABDOMEN AND PELVIS WITHOUT CONTRAST   TECHNIQUE: Multidetector CT imaging of the abdomen and pelvis was performed following the standard protocol without IV contrast.   COMPARISON:  August 12, 2015   FINDINGS: The study is limited due to patient motion.   Lower chest: The visualized heart size within normal limits. No pericardial fluid/thickening.   No hiatal hernia.   The visualized portions of the lungs are clear.   Hepatobiliary: Although limited due to the lack of intravenous contrast, normal in appearance without gross focal abnormality. No evidence of calcified gallstones or biliary ductal dilatation.   Pancreas:  Unremarkable.  No surrounding inflammatory changes.   Spleen: Normal in size. Although limited due to the  lack of intravenous contrast, normal in appearance.   Adrenals/Urinary Tract: Both adrenal glands appear normal. There is perinephric stranding seen around both kidneys and proximal ureteral stranding. Again noted are bilateral partially at exophytic low-density lesions, the largest is off the lower pole of the right kidney measuring 4.4 cm. The bladder is partially decompressed with diffuse bladder wall thickening. No calculi are noted.   Stomach/Bowel: The stomach, small bowel, and colon are normal in appearance. No inflammatory changes or obstructive findings. appendix is normal.   Vascular/Lymphatic: There are no enlarged abdominal or pelvic lymph nodes. No significant gross vascular findings are present.   Reproductive: The prostate is unremarkable.   Other: No evidence of abdominal wall mass or hernia.   Musculoskeletal: No acute or significant osseous findings.   IMPRESSION: 1. Bilateral perinephric and proximal periureteral stranding with diffuse mild bladder wall thickening. This could be due to bilateral pyelonephritis and chronic cystitis. 2. No renal calculi or hydronephrosis 3. Bilateral simple renal cysts.     Electronically Signed By: Jonna Clark M.D. On: 04/17/2019 17:11     Assessment & Plan:  1. Prostate Cancer -Patient to proceed with brachytherapy with SpaceOAR - Urinalysis, Routine w reflex microscopic     No follow-ups on file.   Wilkie Aye, MD   [facility] [place]

## 2023-02-12 NOTE — Anesthesia Postprocedure Evaluation (Signed)
Anesthesia Post Note  Patient: Christopher Lambert  Procedure(s) Performed: RADIOACTIVE SEED IMPLANT/BRACHYTHERAPY IMPLANT SPACE OAR INSTILLATION     Patient location during evaluation: PACU Anesthesia Type: General Level of consciousness: awake and alert Pain management: pain level controlled Vital Signs Assessment: post-procedure vital signs reviewed and stable Respiratory status: spontaneous breathing, nonlabored ventilation and respiratory function stable Cardiovascular status: blood pressure returned to baseline and stable Postop Assessment: no apparent nausea or vomiting Anesthetic complications: no   No notable events documented.  Last Vitals:  Vitals:   02/11/23 1215 02/11/23 1300  BP: 113/66 135/81  Pulse: 74 66  Resp: 12 18  Temp: 36.7 C   SpO2: 100% 98%    Last Pain:  Vitals:   02/11/23 1300  TempSrc:   PainSc: 0-No pain   Pain Goal: Patients Stated Pain Goal: 7 (02/11/23 0834)                 Lowella Curb

## 2023-02-15 ENCOUNTER — Other Ambulatory Visit: Payer: Self-pay | Admitting: Family Medicine

## 2023-02-15 ENCOUNTER — Encounter (HOSPITAL_BASED_OUTPATIENT_CLINIC_OR_DEPARTMENT_OTHER): Payer: Self-pay | Admitting: Urology

## 2023-02-17 NOTE — Progress Notes (Signed)
HPI: Christopher Lambert presents for follow up s/p brachytherapy seed placement on 02/11/2023 by Dr. Ronne Binning for management of prostate cancer. This procedure was performed in coordination with Dr. Kathrynn Running of Radiation Oncology.  Postop course: Today He reports doing well. He reports the catheter is draining well. He denies gross hematuria. He denies fevers. He denies rectal bleeding or pain with defecation.   Fall Screening: Do you usually have a device to assist in your mobility? No   Medications: Current Outpatient Medications  Medication Sig Dispense Refill   allopurinol (ZYLOPRIM) 300 MG tablet TAKE 1 TABLET BY MOUTH EVERY DAY 90 tablet 1   aspirin 81 MG EC tablet TAKE 1 TABLET EVERY DAY (Patient taking differently: Take 81 mg by mouth daily.) 150 tablet 3   cholecalciferol (VITAMIN D) 1000 UNITS tablet Take 1,000 Units by mouth daily.     ELDERBERRY PO Take 1 tablet by mouth daily.      fluticasone (FLONASE) 50 MCG/ACT nasal spray PLACE 2 SPRAYS INTO BOTH NOSTRILS 2 (TWO) TIMES DAILY 48 mL 1   furosemide (LASIX) 20 MG tablet TAKE 1 TABLET BY MOUTH EVERY DAY 90 tablet 2   lisinopril-hydrochlorothiazide (ZESTORETIC) 20-25 MG tablet Take by mouth.     losartan (COZAAR) 25 MG tablet TAKE 1 TABLET BY MOUTH EVERY DAY 90 tablet 1   meloxicam (MOBIC) 15 MG tablet TAKE 1 TABLET (15 MG TOTAL) BY MOUTH DAILY. 30 tablet 3   metFORMIN (GLUCOPHAGE) 500 MG tablet Take 1 tablet (500 mg total) by mouth daily with breakfast. 90 tablet 3   Multiple Vitamin (MULTIVITAMIN WITH MINERALS) TABS tablet Take 1 tablet by mouth daily.     NIFEdipine (PROCARDIA XL/NIFEDICAL-XL) 90 MG 24 hr tablet TAKE 1 TABLET BY MOUTH EVERY DAY 90 tablet 1   potassium chloride (KLOR-CON) 10 MEQ tablet TAKE 2 TABLETS BY MOUTH EVERY DAY 180 tablet 0   simvastatin (ZOCOR) 5 MG tablet TAKE 1 TABLET BY MOUTH EVERYDAY AT BEDTIME 90 tablet 3   traMADol (ULTRAM) 50 MG tablet Take 1 tablet (50 mg total) by mouth every 6 (six) hours as  needed. 15 tablet 0   No current facility-administered medications for this visit.    Allergies: No Known Allergies  Past Medical History:  Diagnosis Date   Abnormal cardiovascular function study 09/21/2011   Arthritis of knee    Bilateral   Cancer (HCC)    Diabetes mellitus 06/2008   type 2 NIDDM x 7 yrs; off meds   Diabetes mellitus without complication (HCC)    Phreesia 08/18/2020   ED (erectile dysfunction)    Essential hypertension, benign 1990   H/O: whooping cough    aschild   Hemorrhoids, internal    Hypertension    Phreesia 08/18/2020   Metabolic syndrome 01/23/2012   Obesity    Obstructive sleep apnea 11/09/2007   NPSG 08/26/04 AHI 92/hr, [place], [place] NPSG 01/31/15- severe OSA AHI 49.4/ hr, CPAP suggested 13, AHI 0.0, weight 294 lbs CPAP Advanced     Palpitations 08/24/2011   Piles (hemorrhoids) 06/19/2014   Seasonal allergies    Sleep apnea 2004   wears CPAP nightly     Past Surgical History:  Procedure Laterality Date   CARDIAC CATHETERIZATION  09/25/2011   CARPAL TUNNEL RELEASE     Right    COLONOSCOPY N/A 03/23/2018   Procedure: COLONOSCOPY;  Surgeon: Corbin Ade, MD;  [facility];  Service: Endoscopy;  Laterality: N/A;  8:00   ENDOVENOUS ABLATION SAPHENOUS  VEIN W/ LASER Right 10/10/2020   endovenous laser ablation right greater saphenous vein by Cari Caraway MD    HERNIA REPAIR  2006 approx   umbilical   JOINT REPLACEMENT  2011   right knee   JOINT REPLACEMENT  2013   left knee   left knee replacement   sept 2013   POLYPECTOMY  03/23/2018   Procedure: POLYPECTOMY;  Surgeon: Corbin Ade, MD;  Location: [facility];  Service: Endoscopy;;  colon   RADIOACTIVE SEED IMPLANT N/A 02/11/2023   Procedure: RADIOACTIVE SEED IMPLANT/BRACHYTHERAPY IMPLANT;  Surgeon: Malen Gauze, MD;  [facility];  Service: Urology;  Laterality: N/A;   Right knee replacement  06/2010   SPACE OAR INSTILLATION N/A 02/11/2023    Procedure: SPACE OAR INSTILLATION;  Surgeon: Malen Gauze, MD;  Location: [facility];  Service: Urology;  Laterality: N/A;   TOTAL KNEE ARTHROPLASTY  05/18/2012   Procedure: TOTAL KNEE ARTHROPLASTY;  Surgeon: Loreta Ave, MD;  Location: [facility];  Service: Orthopedics;  Laterality: Left;  left total knee arthroplasty   UMBILICAL HERNIA REPAIR      Family History  Problem Relation Age of Onset   Coronary artery disease Brother 7       Premature   Hypertension Brother    Hypertension Father    Cancer Father 73       colon    Hypertension Mother    Diabetes Brother    Hypertension Sister    Hypertension Sister    Hypertension Sister    Hypertension Sister    Social History   Socioeconomic History   Marital status: Married    Spouse name: Psychologist, forensic   Number of children: 0   Years of education: Not on file   Highest education level: Not on file  Occupational History    Employer: [employer]  Tobacco Use   Smoking status: Former    Packs/day: .5    Types: Cigarettes   Smokeless tobacco: Never  Vaping Use   Vaping Use: Never used  Substance and Sexual Activity   Alcohol use: No   Drug use: No   Sexual activity: Yes  Other Topics Concern   Not on file  Social History Narrative   Married x 36 yrs in 2022.   Social Determinants of Health   Financial Resource Strain: Low Risk  (06/18/2021)   Overall Financial Resource Strain (CARDIA)    Difficulty of Paying Living Expenses: Not hard at all  Food Insecurity: No Food Insecurity (12/14/2022)   Hunger Vital Sign    Worried About Running Out of Food in the Last Year: Never true    Ran Out of Food in the Last Year: Never true  Transportation Needs: No Transportation Needs (12/14/2022)   PRAPARE - Administrator, Civil Service (Medical): No    Lack of Transportation (Non-Medical): No  Physical Activity: Sufficiently Active (06/18/2021)   Exercise Vital Sign    Days of Exercise per  Week: 5 days    Minutes of Exercise per Session: 30 min  Stress: No Stress Concern Present (06/18/2021)   [employer] of Occupational Health - Occupational Stress Questionnaire    Feeling of Stress : Not at all  Social Connections: Socially Integrated (06/18/2021)   Social Connection and Isolation Panel [NHANES]    Frequency of Communication with Friends and Family: More than three times a week    Frequency of Social Gatherings with Friends  and Family: More than three times a week    Attends Religious Services: More than 4 times per year    Active Member of Clubs or Organizations: Yes    Attends Banker Meetings: More than 4 times per year    Marital Status: Married  Catering manager Violence: Not At Risk (12/14/2022)   Humiliation, Afraid, Rape, and Kick questionnaire    Fear of Current or Ex-Partner: No    Emotionally Abused: No    Physically Abused: No    Sexually Abused: No     SUBJECTIVE  Review of Systems Constitutional: Patient denies any unintentional weight loss or change in strength lntegumentary: Patient denies any rashes or pruritus Eyes: Patient denies dry eyes ENT: Patient denies dry mouth Cardiovascular: Patient denies chest pain or syncope Respiratory: Patient denies shortness of breath Gastrointestinal: Patient denies nausea, vomiting, constipation, or diarrhea Musculoskeletal: Patient denies muscle cramps or weakness Neurologic: Patient denies convulsions or seizures Psychiatric: Patient denies memory problems Allergic/Immunologic: Patient denies recent allergic reaction(s) Hematologic/Lymphatic: Patient denies bleeding tendencies Endocrine: Patient denies heat/cold intolerance  GU: As per HPI.  OBJECTIVE Vitals:   02/18/23 1200  BP: 134/76  Pulse: 88   There is no height or weight on file to calculate BMI.  Physical Examination  Constitutional: No obvious distress; patient is non-toxic appearing  Cardiovascular: No visible lower  extremity edema.  Respiratory: The patient does not have audible wheezing/stridor; respirations do not appear labored  Gastrointestinal: Abdomen non-distended Musculoskeletal: Normal ROM of UEs  Skin: No obvious rashes/open sores  Neurologic: CN 2-12 grossly intact Psychiatric: Answered questions appropriately with normal affect  Hematologic/Lymphatic/Immunologic: No obvious bruises or sites of spontaneous bleeding   ASSESSMENT Prostate cancer (HCC) - Plan: Bladder Voiding Trial  History of brachytherapy - Plan: Bladder Voiding Trial  Postop check - Plan: Bladder Voiding Trial, ciprofloxacin (CIPRO) tablet 500 mg  We reviewed the operative procedure and outcome. He is doing well .  Voiding trial was done and pt was able to successfully empty bladder of the contents instilled. Foley catheter to remain out. Discussion was had about increasing water intake for the next few day to insure good voiding.  Discussed plan to follow up with Dr. Ronne Binning at 3 months postop with PSA check prior. Patient was instructed to follow up with his oncology team as advised. Pt verbalized understanding and agreement. All questions were answered.  PLAN Advised the following: 1. Foley catheter discontinued. 2. Return in about 3 months (around 05/21/2023) for f/u with Dr. Ronne Binning with PSA check prior.  Orders Placed This Encounter  Procedures   Bladder Voiding Trial    It has been explained that the patient is to follow regularly with their PCP in addition to all other providers involved in their care and to follow instructions provided by these respective offices. Patient advised to contact urology clinic if any urologic-pertaining questions, concerns, new symptoms or problems arise in the interim period.  There are no Patient Instructions on file for this visit.  Electronically signed by:  Donnita Falls, MSN, FNP-C, CUNP 02/18/2023 12:20 PM

## 2023-02-18 ENCOUNTER — Ambulatory Visit (INDEPENDENT_AMBULATORY_CARE_PROVIDER_SITE_OTHER): Payer: Medicare Other | Admitting: Urology

## 2023-02-18 ENCOUNTER — Encounter: Payer: Self-pay | Admitting: Urology

## 2023-02-18 VITALS — BP 134/76 | HR 88

## 2023-02-18 DIAGNOSIS — C61 Malignant neoplasm of prostate: Secondary | ICD-10-CM

## 2023-02-18 DIAGNOSIS — Z923 Personal history of irradiation: Secondary | ICD-10-CM

## 2023-02-18 DIAGNOSIS — Z09 Encounter for follow-up examination after completed treatment for conditions other than malignant neoplasm: Secondary | ICD-10-CM

## 2023-02-18 MED ORDER — CIPROFLOXACIN HCL 500 MG PO TABS
500.0000 mg | ORAL_TABLET | Freq: Once | ORAL | Status: AC
Start: 2023-02-18 — End: 2023-02-18
  Administered 2023-02-18: 500 mg via ORAL

## 2023-02-18 NOTE — Progress Notes (Signed)
Fill and Pull Catheter Removal  Patient is present today for a catheter removal.  Patient was cleaned and prepped in a sterile fashion of sterile water/ saline was instilled into the bladder when the patient felt the urge to urinate. 10ml of water was then drained from the balloon.  A 18FR foley cath was removed from the bladder no complications were noted .  Patient as then given some time to void on their own.  Patient can void  on their own after some time.  Patient tolerated well.  Performed by: Maclean Foister LPN  Follow up/ Additional notes: Per NP note

## 2023-02-19 NOTE — Progress Notes (Signed)
Patient was a RadOnc Consult on 12/14/22 for his stage T1c adenocarcinoma of the prostate with Gleason score of 3+4, and PSA of 6.1. Patient proceed with treatment recommendations of brachytherapy and had this on 02/11/23.   Patient is scheduled for his CT Sim post seed on 03/04/23 and has his follow up with urology on 05/07/23.

## 2023-02-25 LAB — HM DIABETES EYE EXAM

## 2023-03-03 ENCOUNTER — Telehealth: Payer: Self-pay | Admitting: *Deleted

## 2023-03-03 NOTE — Progress Notes (Signed)
Radiation Oncology         (336) [phone] ________________________________  Name: Christopher Lambert MRN: [id number]  Date: 03/04/2023  DOB: Aug 28, 1956  Post-Seed Follow-Up Visit Note  CC: Kerri Perches, MD  Malen Gauze, MD  Diagnosis:   67 y.o. gentleman with Stage T1c adenocarcinoma of the prostate with Gleason score of 3+4, and PSA of 6.1.     ICD-10-CM   1. Prostate cancer (HCC)  C61       Interval Since Last Radiation:  3 weeks 02/11/23:  Insertion of radioactive I-125 seeds into the prostate gland; 145 Gy, definitive therapy with placement of SpaceOAR gel.  Narrative:  The patient returns today for routine follow-up.  He is complaining of increased urinary frequency and urinary hesitation symptoms. He filled out a questionnaire regarding urinary function today providing and overall IPSS score of 4 characterizing his symptoms as mild.  His pre-implant score was 8. He denies any abdominal pain or bowel symptoms. He reports a healthy appetite and is maintaining his weight. He has not noted any significant changes in his energy level and overall, is quite pleased with his progress to date.  ALLERGIES:  has No Known Allergies.  Meds: Current Outpatient Medications  Medication Sig Dispense Refill   allopurinol (ZYLOPRIM) 300 MG tablet TAKE 1 TABLET BY MOUTH EVERY DAY 90 tablet 1   aspirin 81 MG EC tablet TAKE 1 TABLET EVERY DAY (Patient taking differently: Take 81 mg by mouth daily.) 150 tablet 3   cholecalciferol (VITAMIN D) 1000 UNITS tablet Take 1,000 Units by mouth daily.     ELDERBERRY PO Take 1 tablet by mouth daily.      fluticasone (FLONASE) 50 MCG/ACT nasal spray PLACE 2 SPRAYS INTO BOTH NOSTRILS 2 (TWO) TIMES DAILY 48 mL 1   furosemide (LASIX) 20 MG tablet TAKE 1 TABLET BY MOUTH EVERY DAY 90 tablet 2   lisinopril-hydrochlorothiazide (ZESTORETIC) 20-25 MG tablet Take by mouth.     losartan (COZAAR) 25 MG tablet TAKE 1 TABLET BY MOUTH EVERY DAY 90 tablet 1    meloxicam (MOBIC) 15 MG tablet TAKE 1 TABLET (15 MG TOTAL) BY MOUTH DAILY. 30 tablet 3   metFORMIN (GLUCOPHAGE) 500 MG tablet Take 1 tablet (500 mg total) by mouth daily with breakfast. 90 tablet 3   Multiple Vitamin (MULTIVITAMIN WITH MINERALS) TABS tablet Take 1 tablet by mouth daily.     NIFEdipine (PROCARDIA XL/NIFEDICAL-XL) 90 MG 24 hr tablet TAKE 1 TABLET BY MOUTH EVERY DAY 90 tablet 1   potassium chloride (KLOR-CON) 10 MEQ tablet TAKE 2 TABLETS BY MOUTH EVERY DAY 180 tablet 0   simvastatin (ZOCOR) 5 MG tablet TAKE 1 TABLET BY MOUTH EVERYDAY AT BEDTIME 90 tablet 3   traMADol (ULTRAM) 50 MG tablet Take 1 tablet (50 mg total) by mouth every 6 (six) hours as needed. 15 tablet 0   No current facility-administered medications for this visit.    Physical Findings: In general this is a well appearing African American male in no acute distress. He's alert and oriented x4 and appropriate throughout the examination. Cardiopulmonary assessment is negative for acute distress and he exhibits normal effort.   Lab Findings: Lab Results  Component Value Date   WBC 4.3 09/30/2022   HGB 15.0 02/11/2023   HCT 44.0 02/11/2023   MCV 91 09/30/2022   PLT 219 09/30/2022    Radiographic Findings:  Patient underwent CT imaging in our clinic for post implant dosimetry. The CT will be reviewed by Dr.  Kathrynn Running to confirm there is an adequate distribution of radioactive seeds throughout the prostate gland and ensure that there are no seeds in or near the rectum.  We suspect the final radiation plan and dosimetry will show appropriate coverage of the prostate gland. He understands that we will call and inform him of any unexpected findings on further review of his imaging and dosimetry.  Impression/Plan: 66 y.o. gentleman with Stage T1c adenocarcinoma of the prostate with Gleason score of 3+4, and PSA of 6.1.  The patient is recovering from the effects of radiation. His urinary symptoms should gradually improve  over the next 4-6 months. We talked about this today. He is encouraged by his improvement already and is otherwise pleased with his outcome. We also talked about long-term follow-up for prostate cancer following seed implant. He understands that ongoing PSA determinations and digital rectal exams will help perform surveillance to rule out disease recurrence. He was seen by Diona Fanti, NP on 02/18/23 and successfully passed his voiding trial. He has a follow up appointment scheduled with Dr. Ronne Binning on 05/07/23 with his first post-treatment PSA to be drawn prior to that visit. He understands what to expect with his PSA measures. Patient was also educated today about some of the long-term effects from radiation including a small risk for rectal bleeding and possibly erectile dysfunction. We talked about some of the general management approaches to these potential complications. However, I did encourage the patient to contact our office or return at any point if he has questions or concerns related to his previous radiation and prostate cancer.    Marguarite Arbour, PA-C

## 2023-03-03 NOTE — Telephone Encounter (Signed)
CALLED PATIENT TO REMIND OF POST SEED APPTS. FOR 03-04-23, SPOKE WITH PATIENT AND HE IS AWARE OF THESE APPTS.

## 2023-03-03 NOTE — Progress Notes (Signed)
  Radiation Oncology         [phone]) [phone] ________________________________  Name: GRANVILLE LOCHRIDGE MRN: [id number]  Date: 03/04/2023  DOB: 17-Nov-1955  COMPLEX SIMULATION NOTE  NARRATIVE:  The patient was brought to the CT Simulation planning suite today following prostate seed implantation approximately one month ago.  Identity was confirmed.  All relevant records and images related to the planned course of therapy were reviewed.  Then, the patient was set-up supine.  CT images were obtained.  The CT images were loaded into the planning software.  Then the prostate and rectum were contoured.  Treatment planning then occurred.  The implanted iodine 125 seeds were identified by the physics staff for projection of radiation distribution  I have requested : 3D Simulation  I have requested a DVH of the following structures: Prostate and rectum.    ________________________________  [name], M.D.

## 2023-03-04 ENCOUNTER — Other Ambulatory Visit: Payer: Self-pay

## 2023-03-04 ENCOUNTER — Ambulatory Visit
Admission: RE | Admit: 2023-03-04 | Discharge: 2023-03-04 | Disposition: A | Discharge status: Home / Self Care | Payer: Medicare Other | Source: Ambulatory Visit | Attending: Radiation Oncology | Admitting: Radiation Oncology

## 2023-03-04 ENCOUNTER — Ambulatory Visit
Admission: RE | Admit: 2023-03-04 | Discharge: 2023-03-04 | Disposition: A | Discharge status: Home / Self Care | Payer: Medicare Other | Source: Ambulatory Visit | Attending: Urology | Admitting: Urology

## 2023-03-04 ENCOUNTER — Encounter: Payer: Self-pay | Admitting: Urology

## 2023-03-04 VITALS — BP 133/85 | HR 70 | Temp 97.8°F | Resp 18 | Ht 67.0 in | Wt 285.0 lb

## 2023-03-04 VITALS — BP 133/85 | HR 71 | Temp 97.7°F | Resp 18 | Ht 67.0 in | Wt 287.2 lb

## 2023-03-04 DIAGNOSIS — C61 Malignant neoplasm of prostate: Secondary | ICD-10-CM

## 2023-03-04 NOTE — Progress Notes (Addendum)
Post-seed nursing interview for a diagnosis of Stage T1c adenocarcinoma of the prostate with Gleason score of 3+4, and PSA of 6.1.  Patient identity verified x2.   Patient reports doing well. No issues conveyed at this time.  Meaningful use complete.  I-PSS score- 4 - Mild SHIM score- 5 (No sexual activity within last 6 months) Urinary Management medication(s) None Urology appointment date- 03/2023 with Dr. Ronne Binning at [facility] [place]  Vitals- BP 133/85 (BP Location: Left Arm, Patient Position: Sitting, Cuff Size: Large)   Pulse 70   Temp 97.8 F (36.6 C) (Temporal)   Resp 18   Ht 5\' 7"  (1.702 m)   Wt 285 lb (129.3 kg)   SpO2 100%   BMI 44.64 kg/m   This concludes the interview.   Ruel Favors, LPN

## 2023-03-06 ENCOUNTER — Other Ambulatory Visit: Payer: Self-pay | Admitting: Family Medicine

## 2023-03-12 ENCOUNTER — Encounter: Payer: Self-pay | Admitting: Radiation Oncology

## 2023-03-12 DIAGNOSIS — C61 Malignant neoplasm of prostate: Secondary | ICD-10-CM | POA: Diagnosis not present

## 2023-03-13 NOTE — Progress Notes (Signed)
  Radiation Oncology         (336) [phone] ________________________________  Name: KEKAI LEMANSKI MRN: [id number]  Date: 03/12/2023  DOB: 11/29/1955  3D Planning Note   Prostate Brachytherapy Post-Implant Dosimetry  Diagnosis: 67 y.o. gentleman with Stage T1c adenocarcinoma of the prostate with Gleason score of 3+4, and PSA of 6.1.   Narrative: On a previous date, ZAQUAN WHISLER returned following prostate seed implantation for post implant planning. He underwent CT scan complex simulation to delineate the three-dimensional structures of the pelvis and demonstrate the radiation distribution.  Since that time, the seed localization, and complex isodose planning with dose volume histograms have now been completed.  Results:   Prostate Coverage - The dose of radiation delivered to the 90% or more of the prostate gland (D90) was 114.68% of the prescription dose. This exceeds our goal of greater than 90%. Rectal Sparing - The volume of rectal tissue receiving the prescription dose or higher was 0.0 cc. This falls under our thresholds tolerance of 1.0 cc.  Impression: The prostate seed implant appears to show adequate target coverage and appropriate rectal sparing.  Plan:  The patient will continue to follow with urology for ongoing PSA determinations. I would anticipate a high likelihood for local tumor control with minimal risk for rectal morbidity.  ________________________________  [name], M.D.

## 2023-03-16 ENCOUNTER — Other Ambulatory Visit: Payer: Self-pay | Admitting: Podiatry

## 2023-04-09 ENCOUNTER — Other Ambulatory Visit: Payer: Medicare Other

## 2023-04-11 ENCOUNTER — Other Ambulatory Visit: Payer: Self-pay | Admitting: Family Medicine

## 2023-04-16 ENCOUNTER — Ambulatory Visit: Payer: Medicare Other | Admitting: Urology

## 2023-04-27 ENCOUNTER — Ambulatory Visit (INDEPENDENT_AMBULATORY_CARE_PROVIDER_SITE_OTHER): Payer: Medicare Other | Admitting: Podiatry

## 2023-04-27 DIAGNOSIS — M79674 Pain in right toe(s): Secondary | ICD-10-CM

## 2023-04-27 DIAGNOSIS — M79675 Pain in left toe(s): Secondary | ICD-10-CM

## 2023-04-27 DIAGNOSIS — B351 Tinea unguium: Secondary | ICD-10-CM | POA: Diagnosis not present

## 2023-04-27 NOTE — Progress Notes (Unsigned)
       Subjective:  Patient ID: Christopher Lambert, male    DOB: 1956/09/12,  MRN: [id number]   Christopher Lambert presents to clinic today for:  Chief Complaint  Patient presents with   Diabetes    [facility] BS - DON'T CHECK IT A1C - DK    This [employer] veteran presents today with concern of nails that are thick, discolored, elongated and painful in shoegear when trying to ambulate.  He is aware that he has pincer nails on bilateral hallux toenails.  He states the left is worse than the right.  He is requesting the corners be cut back to get relief.  37-year-old 3  PCP is Kerri Perches, MD.  No Known Allergies  Review of Systems: Negative except as noted in the HPI.  Objective:  There were no vitals filed for this visit.  Christopher Lambert is a pleasant 67 y.o. male in NAD. AAO x 3.  Vascular Examination: Capillary refill time is 3-5 seconds to toes bilateral. Palpable pedal pulses b/l LE. Digital hair present b/l. No pedal edema b/l. Skin temperature gradient WNL b/l. No varicosities b/l. No cyanosis or clubbing noted b/l.   Dermatological Examination: Pedal skin with normal turgor, texture and tone b/l. No open wounds. No interdigital macerations b/l. Toenails x10 are 3mm thick, discolored, dystrophic with subungual debris. There is pain with compression of the nail plates.  They are elongated x10.  The hallux nails are markedly incurvated and pincer nail deformity.  There is pain on palpation of the nail borders.  There is no signs of paronychia.     Latest Ref Rng & Units 01/29/2023    8:39 AM 09/30/2022    1:03 PM 07/15/2022    9:09 AM  Hemoglobin A1C  Hemoglobin-A1c 4.8 - 5.6 % 6.5  6.4  6.5    Assessment/Plan: 1. Pain due to onychomycosis of toenails of both feet    The mycotic toenails were sharply debrided x10 with sterile nail nippers and a power debriding burr to decrease bulk/thickness and length.  The distal hallux borders were cut back uneventfully and immediate relief was  noted.  Return for he's already scheduled for his 3 month [facility] appt with Dr. Eloy End.   Clerance Lav, DPM, FACFAS [facility]     [address] [place], [place] [address]                Office [phone]  Fax [phone]

## 2023-04-30 ENCOUNTER — Other Ambulatory Visit: Payer: Medicare Other

## 2023-05-06 ENCOUNTER — Other Ambulatory Visit: Payer: Self-pay | Admitting: Family Medicine

## 2023-05-07 ENCOUNTER — Ambulatory Visit: Payer: Medicare Other | Admitting: Urology

## 2023-05-14 ENCOUNTER — Other Ambulatory Visit: Payer: Medicare Other

## 2023-05-14 DIAGNOSIS — C61 Malignant neoplasm of prostate: Secondary | ICD-10-CM

## 2023-05-15 LAB — PSA, TOTAL AND FREE
PSA, Free Pct: 9.4 %
PSA, Free: 0.17 ng/mL
Prostate Specific Ag, Serum: 1.8 ng/mL (ref 0.0–4.0)

## 2023-05-21 ENCOUNTER — Ambulatory Visit: Payer: Medicare Other | Admitting: Urology

## 2023-05-21 VITALS — BP 137/78 | HR 61

## 2023-05-21 DIAGNOSIS — C61 Malignant neoplasm of prostate: Secondary | ICD-10-CM | POA: Diagnosis not present

## 2023-05-21 LAB — URINALYSIS, ROUTINE W REFLEX MICROSCOPIC
Bilirubin, UA: NEGATIVE
Glucose, UA: NEGATIVE
Ketones, UA: NEGATIVE
Leukocytes,UA: NEGATIVE
Nitrite, UA: NEGATIVE
Protein,UA: NEGATIVE
Specific Gravity, UA: 1.015 (ref 1.005–1.030)
Urobilinogen, Ur: 0.2 mg/dL (ref 0.2–1.0)
pH, UA: 7.5 (ref 5.0–7.5)

## 2023-05-21 LAB — MICROSCOPIC EXAMINATION: Bacteria, UA: NONE SEEN

## 2023-05-21 NOTE — Patient Instructions (Signed)

## 2023-05-21 NOTE — Progress Notes (Unsigned)
05/21/2023 11:23 AM   [name] 24-Feb-1956 [id number]  Referring provider: Kerri Perches, MD [address] [place],  [place] [address]  No chief complaint on file.   HPI: PSA decreased to 1.8 from 6.0 after brachytherapy. IPSS 4 QOL 1 on no BPh therapy. Nocturia 1x.    PMH: Past Medical History:  Diagnosis Date   Abnormal cardiovascular function study 09/21/2011   Arthritis of knee    Bilateral   Cancer (HCC)    Diabetes mellitus 06/2008   type 2 NIDDM x 7 yrs; off meds   Diabetes mellitus without complication (HCC)    Phreesia 08/18/2020   ED (erectile dysfunction)    Essential hypertension, benign 1990   H/O: whooping cough    aschild   Hemorrhoids, internal    Hypertension    Phreesia 08/18/2020   Metabolic syndrome 01/23/2012   Obesity    Obstructive sleep apnea 11/09/2007   NPSG 08/26/04 AHI 92/hr, [place], [place] NPSG 01/31/15- severe OSA AHI 49.4/ hr, CPAP suggested 13, AHI 0.0, weight 294 lbs CPAP Advanced     Palpitations 08/24/2011   Piles (hemorrhoids) 06/19/2014   Seasonal allergies    Sleep apnea 2004   wears CPAP nightly      Surgical History: Past Surgical History:  Procedure Laterality Date   CARDIAC CATHETERIZATION  09/25/2011   CARPAL TUNNEL RELEASE     Right    COLONOSCOPY N/A 03/23/2018   Procedure: COLONOSCOPY;  Surgeon: Corbin Ade, MD;  [facility];  Service: Endoscopy;  Laterality: N/A;  8:00   ENDOVENOUS ABLATION SAPHENOUS VEIN W/ LASER Right 10/10/2020   endovenous laser ablation right greater saphenous vein by Cari Caraway MD    HERNIA REPAIR  2006 approx   umbilical   JOINT REPLACEMENT  2011   right knee   JOINT REPLACEMENT  2013   left knee   left knee replacement   sept 2013   POLYPECTOMY  03/23/2018   Procedure: POLYPECTOMY;  Surgeon: Corbin Ade, MD;  [facility];  Service: Endoscopy;;  colon   RADIOACTIVE SEED IMPLANT N/A 02/11/2023   Procedure: RADIOACTIVE SEED  IMPLANT/BRACHYTHERAPY IMPLANT;  Surgeon: Malen Gauze, MD;  Location: [facility];  Service: Urology;  Laterality: N/A;   Right knee replacement  06/2010   SPACE OAR INSTILLATION N/A 02/11/2023   Procedure: SPACE OAR INSTILLATION;  Surgeon: Malen Gauze, MD;  Location: [facility];  Service: Urology;  Laterality: N/A;   TOTAL KNEE ARTHROPLASTY  05/18/2012   Procedure: TOTAL KNEE ARTHROPLASTY;  Surgeon: Loreta Ave, MD;  Location: [facility] OR;  Service: Orthopedics;  Laterality: Left;  left total knee arthroplasty   UMBILICAL HERNIA REPAIR      Home Medications:  Allergies as of 05/21/2023   No Known Allergies      Medication List        Accurate as of May 21, 2023 11:23 AM. If you have any questions, ask your nurse or doctor.          allopurinol 300 MG tablet Commonly known as: ZYLOPRIM TAKE 1 TABLET BY MOUTH EVERY DAY   aspirin EC 81 MG tablet TAKE 1 TABLET EVERY DAY   cholecalciferol 1000 units tablet Commonly known as: VITAMIN D Take 1,000 Units by mouth daily.   ELDERBERRY PO Take 1 tablet by mouth daily.   fluticasone 50 MCG/ACT nasal spray Commonly known as: FLONASE PLACE 2 SPRAYS INTO BOTH NOSTRILS 2 (  TWO) TIMES DAILY   furosemide 20 MG tablet Commonly known as: LASIX TAKE 1 TABLET BY MOUTH EVERY DAY   lisinopril-hydrochlorothiazide 20-25 MG tablet Commonly known as: ZESTORETIC Take by mouth.   losartan 25 MG tablet Commonly known as: COZAAR TAKE 1 TABLET BY MOUTH EVERY DAY   meloxicam 15 MG tablet Commonly known as: MOBIC TAKE 1 TABLET (15 MG TOTAL) BY MOUTH DAILY.   metFORMIN 500 MG tablet Commonly known as: GLUCOPHAGE TAKE 1 TABLET BY MOUTH EVERY DAY WITH BREAKFAST   multivitamin with minerals Tabs tablet Take 1 tablet by mouth daily.   NIFEdipine 90 MG 24 hr tablet Commonly known as: PROCARDIA XL/NIFEDICAL-XL TAKE 1 TABLET BY MOUTH EVERY DAY   potassium chloride 10 MEQ tablet Commonly known  as: KLOR-CON TAKE 2 TABLETS BY MOUTH EVERY DAY   simvastatin 5 MG tablet Commonly known as: ZOCOR TAKE 1 TABLET BY MOUTH EVERYDAY AT BEDTIME   traMADol 50 MG tablet Commonly known as: Ultram Take 1 tablet (50 mg total) by mouth every 6 (six) hours as needed.        Allergies: No Known Allergies  Family History: Family History  Problem Relation Age of Onset   Coronary artery disease Brother 81       Premature   Hypertension Brother    Hypertension Father    Cancer Father 66       colon    Hypertension Mother    Diabetes Brother    Hypertension Sister    Hypertension Sister    Hypertension Sister    Hypertension Sister     Social History:  reports that he has quit smoking. His smoking use included cigarettes. He has never used smokeless tobacco. He reports that he does not drink alcohol and does not use drugs.  ROS: All other review of systems were reviewed and are negative except what is noted above in HPI  Physical Exam: BP 137/78   Pulse 61   Constitutional:  Alert and oriented, No acute distress. HEENT: [place] AT, moist mucus membranes.  Trachea midline, no masses. Cardiovascular: No clubbing, cyanosis, or edema. Respiratory: Normal respiratory effort, no increased work of breathing. GI: Abdomen is soft, nontender, nondistended, no abdominal masses GU: No CVA tenderness.  Lymph: No cervical or inguinal lymphadenopathy. Skin: No rashes, bruises or suspicious lesions. Neurologic: Grossly intact, no focal deficits, moving all 4 extremities. Psychiatric: Normal mood and affect.  Laboratory Data: Lab Results  Component Value Date   WBC 4.3 09/30/2022   HGB 15.0 02/11/2023   HCT 44.0 02/11/2023   MCV 91 09/30/2022   PLT 219 09/30/2022    Lab Results  Component Value Date   CREATININE 0.90 02/11/2023    Lab Results  Component Value Date   PSA 2.7 11/30/2019   PSA 4.5 (H) 04/11/2019   PSA 3.0 09/20/2017    No results found for: "TESTOSTERONE"  Lab  Results  Component Value Date   HGBA1C 6.5 (H) 01/29/2023    Urinalysis    Component Value Date/Time   COLORURINE AMBER (A) 04/16/2019 1642   APPEARANCEUR Clear 01/04/2023 0926   LABSPEC 1.019 04/16/2019 1642   PHURINE 5.0 04/16/2019 1642   GLUCOSEU Negative 01/04/2023 0926   HGBUR LARGE (A) 04/16/2019 1642   BILIRUBINUR Negative 01/04/2023 0926   KETONESUR NEGATIVE 04/16/2019 1642   PROTEINUR Negative 01/04/2023 0926   PROTEINUR 30 (A) 04/16/2019 1642   UROBILINOGEN 1.0 06/22/2016 1310   UROBILINOGEN 0.2 05/12/2012 0901   NITRITE Negative 01/04/2023 0926  NITRITE NEGATIVE 04/16/2019 1642   LEUKOCYTESUR Negative 01/04/2023 0926   LEUKOCYTESUR MODERATE (A) 04/16/2019 1642    Lab Results  Component Value Date   LABMICR See below: 01/04/2023   WBCUA 0-5 01/04/2023   LABEPIT 0-10 01/04/2023   MUCUS Present 04/14/2022   BACTERIA None seen 01/04/2023    Pertinent Imaging: *** No results found for this or any previous visit.  No results found for this or any previous visit.  No results found for this or any previous visit.  No results found for this or any previous visit.  No results found for this or any previous visit.  No valid procedures specified. No results found for this or any previous visit.  Results for orders placed during the hospital encounter of 04/16/19  CT RENAL STONE STUDY  Narrative CLINICAL DATA:  Weakness fever body aches  EXAM: CT ABDOMEN AND PELVIS WITHOUT CONTRAST  TECHNIQUE: Multidetector CT imaging of the abdomen and pelvis was performed following the standard protocol without IV contrast.  COMPARISON:  August 12, 2015  FINDINGS: The study is limited due to patient motion.  Lower chest: The visualized heart size within normal limits. No pericardial fluid/thickening.  No hiatal hernia.  The visualized portions of the lungs are clear.  Hepatobiliary: Although limited due to the lack of intravenous contrast, normal in  appearance without gross focal abnormality. No evidence of calcified gallstones or biliary ductal dilatation.  Pancreas:  Unremarkable.  No surrounding inflammatory changes.  Spleen: Normal in size. Although limited due to the lack of intravenous contrast, normal in appearance.  Adrenals/Urinary Tract: Both adrenal glands appear normal. There is perinephric stranding seen around both kidneys and proximal ureteral stranding. Again noted are bilateral partially at exophytic low-density lesions, the largest is off the lower pole of the right kidney measuring 4.4 cm. The bladder is partially decompressed with diffuse bladder wall thickening. No calculi are noted.  Stomach/Bowel: The stomach, small bowel, and colon are normal in appearance. No inflammatory changes or obstructive findings. appendix is normal.  Vascular/Lymphatic: There are no enlarged abdominal or pelvic lymph nodes. No significant gross vascular findings are present.  Reproductive: The prostate is unremarkable.  Other: No evidence of abdominal wall mass or hernia.  Musculoskeletal: No acute or significant osseous findings.  IMPRESSION: 1. Bilateral perinephric and proximal periureteral stranding with diffuse mild bladder wall thickening. This could be due to bilateral pyelonephritis and chronic cystitis. 2. No renal calculi or hydronephrosis 3. Bilateral simple renal cysts.   Electronically Signed By: Jonna Clark M.D. On: 04/17/2019 17:11   Assessment & Plan:    1. Prostate cancer (HCC) Followup 3 months with PSA - Urinalysis, Routine w reflex microscopic   No follow-ups on file.  Wilkie Aye, MD  [facility] [place]

## 2023-05-25 ENCOUNTER — Encounter: Payer: Self-pay | Admitting: Urology

## 2023-06-05 ENCOUNTER — Other Ambulatory Visit: Payer: Self-pay | Admitting: Family Medicine

## 2023-06-14 ENCOUNTER — Other Ambulatory Visit: Payer: Self-pay | Admitting: Family Medicine

## 2023-06-17 ENCOUNTER — Ambulatory Visit: Payer: Self-pay | Admitting: Radiation Oncology

## 2023-07-10 ENCOUNTER — Other Ambulatory Visit: Payer: Self-pay | Admitting: Podiatry

## 2023-07-13 ENCOUNTER — Encounter: Payer: Self-pay | Admitting: Family Medicine

## 2023-07-13 ENCOUNTER — Ambulatory Visit: Payer: Medicare Other | Admitting: Family Medicine

## 2023-07-13 VITALS — BP 122/77 | HR 87 | Ht 67.0 in | Wt 289.1 lb

## 2023-07-13 DIAGNOSIS — Z23 Encounter for immunization: Secondary | ICD-10-CM | POA: Diagnosis not present

## 2023-07-13 DIAGNOSIS — I10 Essential (primary) hypertension: Secondary | ICD-10-CM | POA: Diagnosis not present

## 2023-07-13 DIAGNOSIS — E790 Hyperuricemia without signs of inflammatory arthritis and tophaceous disease: Secondary | ICD-10-CM | POA: Insufficient documentation

## 2023-07-13 DIAGNOSIS — G8929 Other chronic pain: Secondary | ICD-10-CM | POA: Insufficient documentation

## 2023-07-13 DIAGNOSIS — E1169 Type 2 diabetes mellitus with other specified complication: Secondary | ICD-10-CM

## 2023-07-13 DIAGNOSIS — J301 Allergic rhinitis due to pollen: Secondary | ICD-10-CM

## 2023-07-13 DIAGNOSIS — E785 Hyperlipidemia, unspecified: Secondary | ICD-10-CM

## 2023-07-13 NOTE — Patient Instructions (Addendum)
Annual exam in 4 months, call if you need me sooner    Lipid, cmp and eGFr, hBA1c, Uric acid level, Urine ACR today  Flu vaccine today  It is important that you exercise regularly at least 30 minutes 5 times a week. If you develop chest pain, have severe difficulty breathing, or feel very tired, stop exercising immediately and seek medical attention    Nurse pls send for recent eye exam Dr Nile Riggs   Think about what you will eat, plan ahead. Choose " clean, green, fresh or frozen" over canned, processed or packaged foods which are more sugary, salty and fatty. 70 to 75% of food eaten should be vegetables and fruit. Three meals at set times with snacks allowed between meals, but they must be fruit or vegetables. Aim to eat over a 12 hour period , example 7 am to 7 pm, and STOP after  your last meal of the day. Drink water,generally about 64 ounces per day, no other drink is as healthy. Fruit juice is best enjoyed in a healthy way, by EATING the fruit.   BEST for 2025

## 2023-07-13 NOTE — Assessment & Plan Note (Signed)
[name] is reminded of the importance of commitment to daily physical activity for 30 minutes or more, as able and the need to limit carbohydrate intake to 30 to 60 grams per meal to help with blood sugar control.   The need to take medication as prescribed, test blood sugar as directed, and to call between visits if there is a concern that blood sugar is uncontrolled is also discussed.   [name] is reminded of the importance of daily foot exam, annual eye examination, and good blood sugar, blood pressure and cholesterol control. Updated lab needed t.      Latest Ref Rng & Units 02/11/2023    9:05 AM 01/29/2023    8:39 AM 09/30/2022    1:03 PM 07/15/2022    9:09 AM 03/31/2022    8:42 AM  Diabetic Labs  HbA1c 4.8 - 5.6 %  6.5  6.4  6.5    Micro/Creat Ratio 0 - 29 mg/g creat     <3   Chol 100 - 199 mg/dL  865  784   696   HDL >29 mg/dL  40  47   42   Calc LDL 0 - 99 mg/dL  76  91   75   Triglycerides 0 - 149 mg/dL  528  413   244   Creatinine 0.61 - 1.24 mg/dL 0.10  2.72  5.36  6.44  1.03       07/13/2023    9:40 AM 05/21/2023   11:16 AM 05/21/2023   11:15 AM 03/04/2023   10:13 AM 03/04/2023   10:01 AM 02/18/2023   12:00 PM 02/11/2023    1:00 PM  BP/Weight  Systolic BP 122 137 152 133 133 134 135  Diastolic BP 77 78 81 85 85 76 81  Wt. (Lbs) 289.12   285 287.25    BMI 45.28 kg/m2   44.64 kg/m2 44.99 kg/m2        Latest Ref Rng & Units 02/24/2021   12:00 AM 02/22/2020   12:00 AM  Foot/eye exam completion dates  Eye Exam No Retinopathy No Retinopathy     No Retinopathy         This result is from an external source.

## 2023-07-13 NOTE — Assessment & Plan Note (Signed)
Controlled, no change in medication  

## 2023-07-13 NOTE — Progress Notes (Signed)
Christopher Lambert     MRN: [id number]      DOB: [birth year]  Chief Complaint  Patient presents with   Follow-up    Follow up flu shot    HPI Christopher Lambert is here for follow up and re-evaluation of chronic medical conditions, medication management and review of any available recent lab and radiology data.  Preventive health is updated, specifically  Cancer screening and Immunization.   Questions or concerns regarding consultations or procedures which the PT has had in the interim are  addressed. The PT denies any adverse reactions to current medications since the last visit.  There are no new concerns.  There are no specific complaints   ROS Denies recent fever or chills. Denies sinus pressure, nasal congestion, ear pain or sore throat. Denies chest congestion, productive cough or wheezing. Denies chest pains, palpitations and leg swelling Denies abdominal pain, nausea, vomiting,diarrhea or constipation.   Denies dysuria, frequency, hesitancy or incontinence. Denies joint pain, swelling and limitation in mobility. Denies headaches, seizures, numbness, or tingling. Denies depression, anxiety or insomnia. Denies skin break down or rash.   PE  BP 122/77 (BP Location: Right Arm, Patient Position: Sitting, Cuff Size: Large)   Pulse 87   Ht 5\' 7"  (1.702 m)   Wt 289 lb 1.9 oz (131.1 kg)   SpO2 97%   BMI 45.28 kg/m   Patient alert and oriented and in no cardiopulmonary distress.  HEENT: No facial asymmetry, EOMI,     Neck supple .  Chest: Clear to auscultation bilaterally.  CVS: S1, S2 no murmurs, no S3.Regular rate.  ABD: Soft non tender.   Ext: No edema  MS: Adequate ROM spine, shoulders, hips and knees.  Skin: Intact, no ulcerations or rash noted.  Psych: Good eye contact, normal affect. Memory intact not anxious or depressed appearing.  CNS: CN 2-12 intact, power,  normal throughout.no focal deficits noted.   Assessment & Plan  Essential hypertension,  benign Controlled, no change in medication DASH diet and commitment to daily physical activity for a minimum of 30 minutes discussed and encouraged, as a part of hypertension management. The importance of attaining a healthy weight is also discussed.     07/13/2023    9:40 AM 05/21/2023   11:16 AM 05/21/2023   11:15 AM 03/04/2023   10:13 AM 03/04/2023   10:01 AM 02/18/2023   12:00 PM 02/11/2023    1:00 PM  BP/Weight  Systolic BP 122 137 152 133 133 134 135  Diastolic BP 77 78 81 85 85 76 81  Wt. (Lbs) 289.12   285 287.25    BMI 45.28 kg/m2   44.64 kg/m2 44.99 kg/m2         Dyslipidemia, goal LDL below 100 Hyperlipidemia:Low fat diet discussed and encouraged.   Lipid Panel  Lab Results  Component Value Date   CHOL 156 01/29/2023   HDL 40 01/29/2023   LDLCALC 76 01/29/2023   TRIG 240 (H) 01/29/2023   CHOLHDL 3.9 01/29/2023     Needs to lower fat intake Updated lab needed   DM type 2 with diabetic dyslipidemia [facility]) Christopher Lambert is reminded of the importance of commitment to daily physical activity for 30 minutes or more, as able and the need to limit carbohydrate intake to 30 to 60 grams per meal to help with blood sugar control.   The need to take medication as prescribed, test blood sugar as directed, and to call between visits if there is a concern  that blood sugar is uncontrolled is also discussed.   Christopher Lambert is reminded of the importance of daily foot exam, annual eye examination, and good blood sugar, blood pressure and cholesterol control. Updated lab needed t.      Latest Ref Rng & Units 02/11/2023    9:05 AM 01/29/2023    8:39 AM 09/30/2022    1:03 PM 07/15/2022    9:09 AM 03/31/2022    8:42 AM  Diabetic Labs  HbA1c 4.8 - 5.6 %  6.5  6.4  6.5    Micro/Creat Ratio 0 - 29 mg/g creat     <3   Chol 100 - 199 mg/dL  191  478   295   HDL >62 mg/dL  40  47   42   Calc LDL 0 - 99 mg/dL  76  91   75   Triglycerides 0 - 149 mg/dL  130  865   784   Creatinine 0.61 - 1.24  mg/dL 6.96  2.95  2.84  1.32  1.03       07/13/2023    9:40 AM 05/21/2023   11:16 AM 05/21/2023   11:15 AM 03/04/2023   10:13 AM 03/04/2023   10:01 AM 02/18/2023   12:00 PM 02/11/2023    1:00 PM  BP/Weight  Systolic BP 122 137 152 133 133 134 135  Diastolic BP 77 78 81 85 85 76 81  Wt. (Lbs) 289.12   285 287.25    BMI 45.28 kg/m2   44.64 kg/m2 44.99 kg/m2        Latest Ref Rng & Units 02/24/2021   12:00 AM 02/22/2020   12:00 AM  Foot/eye exam completion dates  Eye Exam No Retinopathy No Retinopathy     No Retinopathy         This result is from an external source.        Morbid obesity (HCC)  Patient re-educated about  the importance of commitment to a  minimum of 150 minutes of exercise per week as able.  The importance of healthy food choices with portion control discussed, as well as eating regularly and within a 12 hour window most days. The need to choose "clean , green" food 50 to 75% of the time is discussed, as well as to make water the primary drink and set a goal of 64 ounces water daily.       07/13/2023    9:40 AM 03/04/2023   10:13 AM 03/04/2023   10:01 AM  Weight /BMI  Weight 289 lb 1.9 oz 285 lb 287 lb 4 oz  Height 5\' 7"  (1.702 m) 5\' 7"  (1.702 m) 5\' 7"  (1.702 m)  BMI 45.28 kg/m2 44.64 kg/m2 44.99 kg/m2    Need to reduce weight with healthy food choice  Hyperuricemia Updated lab needed at/ before next visit. No recent gout  Encounter for immunization After obtaining informed consent, the vaccine is  administered , with no adverse effect noted at the time of administration.   Seasonal allergic rhinitis Controlled, no change in medication

## 2023-07-13 NOTE — Assessment & Plan Note (Signed)
Updated lab needed at/ before next visit. No recent gout

## 2023-07-13 NOTE — Assessment & Plan Note (Signed)
Controlled, no change in medication DASH diet and commitment to daily physical activity for a minimum of 30 minutes discussed and encouraged, as a part of hypertension management. The importance of attaining a healthy weight is also discussed.     07/13/2023    9:40 AM 05/21/2023   11:16 AM 05/21/2023   11:15 AM 03/04/2023   10:13 AM 03/04/2023   10:01 AM 02/18/2023   12:00 PM 02/11/2023    1:00 PM  BP/Weight  Systolic BP 122 137 152 133 133 134 135  Diastolic BP 77 78 81 85 85 76 81  Wt. (Lbs) 289.12   285 287.25    BMI 45.28 kg/m2   44.64 kg/m2 44.99 kg/m2

## 2023-07-13 NOTE — Assessment & Plan Note (Signed)
After obtaining informed consent, the vaccine is  administered , with no adverse effect noted at the time of administration.  

## 2023-07-13 NOTE — Assessment & Plan Note (Signed)
  Patient re-educated about  the importance of commitment to a  minimum of 150 minutes of exercise per week as able.  The importance of healthy food choices with portion control discussed, as well as eating regularly and within a 12 hour window most days. The need to choose "clean , green" food 50 to 75% of the time is discussed, as well as to make water the primary drink and set a goal of 64 ounces water daily.       07/13/2023    9:40 AM 03/04/2023   10:13 AM 03/04/2023   10:01 AM  Weight /BMI  Weight 289 lb 1.9 oz 285 lb 287 lb 4 oz  Height 5\' 7"  (1.702 m) 5\' 7"  (1.702 m) 5\' 7"  (1.702 m)  BMI 45.28 kg/m2 44.64 kg/m2 44.99 kg/m2    Need to reduce weight with healthy food choice

## 2023-07-13 NOTE — Assessment & Plan Note (Signed)
Hyperlipidemia:Low fat diet discussed and encouraged.   Lipid Panel  Lab Results  Component Value Date   CHOL 156 01/29/2023   HDL 40 01/29/2023   LDLCALC 76 01/29/2023   TRIG 240 (H) 01/29/2023   CHOLHDL 3.9 01/29/2023     Needs to lower fat intake Updated lab needed

## 2023-07-14 LAB — CMP14+EGFR
ALT: 24 [IU]/L (ref 0–44)
AST: 17 [IU]/L (ref 0–40)
Albumin: 4.2 g/dL (ref 3.9–4.9)
Alkaline Phosphatase: 47 [IU]/L (ref 44–121)
BUN/Creatinine Ratio: 10 (ref 10–24)
BUN: 10 mg/dL (ref 8–27)
Bilirubin Total: 0.5 mg/dL (ref 0.0–1.2)
CO2: 24 mmol/L (ref 20–29)
Calcium: 9.1 mg/dL (ref 8.6–10.2)
Chloride: 103 mmol/L (ref 96–106)
Creatinine, Ser: 1.01 mg/dL (ref 0.76–1.27)
Globulin, Total: 2.7 g/dL (ref 1.5–4.5)
Glucose: 122 mg/dL — ABNORMAL HIGH (ref 70–99)
Potassium: 3.7 mmol/L (ref 3.5–5.2)
Sodium: 143 mmol/L (ref 134–144)
Total Protein: 6.9 g/dL (ref 6.0–8.5)
eGFR: 82 mL/min/{1.73_m2} (ref >59)

## 2023-07-14 LAB — MICROALBUMIN / CREATININE URINE RATIO
Creatinine, Urine: 83.5 mg/dL
Microalb/Creat Ratio: 6 mg/g{creat} (ref 0–29)
Microalbumin, Urine: 4.7 ug/mL

## 2023-07-14 LAB — LIPID PANEL
Chol/HDL Ratio: 3.4 ratio (ref 0.0–5.0)
Cholesterol, Total: 141 mg/dL (ref 100–199)
HDL: 41 mg/dL (ref >39)
LDL Chol Calc (NIH): 84 mg/dL (ref 0–99)
Triglycerides: 80 mg/dL (ref 0–149)
VLDL Cholesterol Cal: 16 mg/dL (ref 5–40)

## 2023-07-14 LAB — HEMOGLOBIN A1C
Est. average glucose Bld gHb Est-mCnc: 143 mg/dL
Hgb A1c MFr Bld: 6.6 % — ABNORMAL HIGH (ref 4.8–5.6)

## 2023-07-14 LAB — URIC ACID: Uric Acid: 4 mg/dL (ref 3.8–8.4)

## 2023-08-03 ENCOUNTER — Ambulatory Visit (INDEPENDENT_AMBULATORY_CARE_PROVIDER_SITE_OTHER): Payer: Medicare Other | Admitting: Podiatry

## 2023-08-03 ENCOUNTER — Encounter: Payer: Self-pay | Admitting: Podiatry

## 2023-08-03 DIAGNOSIS — B351 Tinea unguium: Secondary | ICD-10-CM | POA: Diagnosis not present

## 2023-08-03 DIAGNOSIS — M79675 Pain in left toe(s): Secondary | ICD-10-CM

## 2023-08-03 DIAGNOSIS — M79674 Pain in right toe(s): Secondary | ICD-10-CM

## 2023-08-04 ENCOUNTER — Ambulatory Visit: Payer: Medicare Other

## 2023-08-04 VITALS — Ht 67.0 in | Wt 289.0 lb

## 2023-08-04 DIAGNOSIS — Z0000 Encounter for general adult medical examination without abnormal findings: Secondary | ICD-10-CM

## 2023-08-04 NOTE — Patient Instructions (Signed)
Christopher Lambert , Thank you for taking time to come for your Medicare Wellness Visit. I appreciate your ongoing commitment to your health goals. Please review the following plan we discussed and let me know if I can assist you in the future.   Referrals/Orders/Follow-Ups/Clinician Recommendations: Aim for 30 minutes of exercise or brisk walking, 6-8 glasses of water, and 5 servings of fruits and vegetables each day.  This is a list of the screening recommended for you and due dates:  Health Maintenance  Topic Date Due   Eye exam for diabetics  02/25/2023   COVID-19 Vaccine (7 - 2023-24 season) 09/02/2023   Hemoglobin A1C  01/11/2024   Yearly kidney function blood test for diabetes  07/12/2024   Yearly kidney health urinalysis for diabetes  07/12/2024   Medicare Annual Wellness Visit  08/03/2024   DTaP/Tdap/Td vaccine (3 - Td or Tdap) 02/26/2025   Colon Cancer Screening  03/23/2028   Pneumonia Vaccine  Completed   Flu Shot  Completed   Hepatitis C Screening  Completed   Zoster (Shingles) Vaccine  Completed   HPV Vaccine  Aged Out   Complete foot exam   Discontinued    Advanced directives: (ACP Link)Information on Advanced Care Planning can be found at [facility] Advance Health Care Directives Advance Health Care Directives ([email or web address]   Next Medicare Annual Wellness Visit scheduled for next year: Yes

## 2023-08-04 NOTE — Progress Notes (Signed)
Subjective:   Christopher Lambert is a 67 y.o. male who presents for Medicare Annual/Subsequent preventive examination.  Visit Complete: Virtual I connected with  [name] on 08/04/23 by a audio enabled telemedicine application and verified that I am speaking with the correct person using two identifiers.  Patient Location: Home  Provider Location: Home Office  I discussed the limitations of evaluation and management by telemedicine. The patient expressed understanding and agreed to proceed.  Vital Signs: Because this visit was a virtual/telehealth visit, some criteria may be missing or patient reported. Any vitals not documented were not able to be obtained and vitals that have been documented are patient reported.  Cardiac Risk Factors include: advanced age (>60men, >24 women);diabetes mellitus;dyslipidemia;hypertension;male gender;sedentary lifestyle;obesity (BMI >30kg/m2)     Objective:    Today's Vitals   08/04/23 1103  Weight: 289 lb (131.1 kg)  Height: 5\' 7"  (1.702 m)   Body mass index is 45.26 kg/m.     08/04/2023   11:06 AM 03/04/2023   10:04 AM 02/11/2023    8:28 AM 01/07/2023    9:19 AM 12/14/2022   10:41 AM 03/05/2022   11:05 AM 06/18/2021    1:42 PM  Advanced Directives  Does Patient Have a Medical Advance Directive? No No No No No No No  Would patient like information on creating a medical advance directive? Yes (MAU/Ambulatory/Procedural Areas - Information given) Yes (ED - Information included in AVS) Yes (Inpatient - patient defers creating a medical advance directive at this time - Information given) Yes (ED - Information included in AVS)   No - Patient declined    Current Medications (verified) Outpatient Encounter Medications as of 08/04/2023  Medication Sig   allopurinol (ZYLOPRIM) 300 MG tablet TAKE 1 TABLET BY MOUTH EVERY DAY   aspirin 81 MG EC tablet TAKE 1 TABLET EVERY DAY (Patient taking differently: Take 81 mg by mouth daily.)   cholecalciferol  (VITAMIN D) 1000 UNITS tablet Take 1,000 Units by mouth daily.   ELDERBERRY PO Take 1 tablet by mouth daily.    fluticasone (FLONASE) 50 MCG/ACT nasal spray PLACE 2 SPRAYS INTO BOTH NOSTRILS 2 (TWO) TIMES DAILY   furosemide (LASIX) 20 MG tablet TAKE 1 TABLET BY MOUTH EVERY DAY   losartan (COZAAR) 25 MG tablet TAKE 1 TABLET BY MOUTH EVERY DAY   meloxicam (MOBIC) 15 MG tablet TAKE 1 TABLET (15 MG TOTAL) BY MOUTH DAILY.   metFORMIN (GLUCOPHAGE) 500 MG tablet TAKE 1 TABLET BY MOUTH EVERY DAY WITH BREAKFAST   Multiple Vitamin (MULTIVITAMIN WITH MINERALS) TABS tablet Take 1 tablet by mouth daily.   NIFEdipine (PROCARDIA XL/NIFEDICAL-XL) 90 MG 24 hr tablet TAKE 1 TABLET BY MOUTH EVERY DAY   potassium chloride (KLOR-CON) 10 MEQ tablet TAKE 2 TABLETS BY MOUTH EVERY DAY   simvastatin (ZOCOR) 5 MG tablet TAKE 1 TABLET BY MOUTH EVERYDAY AT BEDTIME   traMADol (ULTRAM) 50 MG tablet Take 1 tablet (50 mg total) by mouth every 6 (six) hours as needed.   No facility-administered encounter medications on file as of 08/04/2023.    Allergies (verified) Patient has no known allergies.   History: Past Medical History:  Diagnosis Date   Abnormal cardiovascular function study 09/21/2011   Arthritis of knee    Bilateral   Cancer (HCC)    Diabetes mellitus 06/2008   type 2 NIDDM x 7 yrs; off meds   Diabetes mellitus without complication (HCC)    Phreesia 08/18/2020   ED (erectile dysfunction)  Essential hypertension, benign 1990   H/O: whooping cough    aschild   Hemorrhoids, internal    Hypertension    Phreesia 08/18/2020   Metabolic syndrome 01/23/2012   Obesity    Obstructive sleep apnea 11/09/2007   NPSG 08/26/04 AHI 92/hr, [place] NPSG 01/31/15- severe OSA AHI 49.4/ hr, CPAP suggested 13, AHI 0.0, weight 294 lbs CPAP Advanced     Palpitations 08/24/2011   Piles (hemorrhoids) 06/19/2014   Seasonal allergies    Sleep apnea 2004   wears CPAP nightly     Past Surgical History:  Procedure  Laterality Date   CARDIAC CATHETERIZATION  09/25/2011   CARPAL TUNNEL RELEASE     Right    COLONOSCOPY N/A 03/23/2018   Procedure: COLONOSCOPY;  Surgeon: Corbin Ade, MD;  [facility];  Service: Endoscopy;  Laterality: N/A;  8:00   ENDOVENOUS ABLATION SAPHENOUS VEIN W/ LASER Right 10/10/2020   endovenous laser ablation right greater saphenous vein by Cari Caraway MD    HERNIA REPAIR  2006 approx   umbilical   JOINT REPLACEMENT  2011   right knee   JOINT REPLACEMENT  2013   left knee   left knee replacement   sept 2013   POLYPECTOMY  03/23/2018   Procedure: POLYPECTOMY;  Surgeon: Corbin Ade, MD;  [facility];  Service: Endoscopy;;  colon   RADIOACTIVE SEED IMPLANT N/A 02/11/2023   Procedure: RADIOACTIVE SEED IMPLANT/BRACHYTHERAPY IMPLANT;  Surgeon: Malen Gauze, MD;  [facility];  Service: Urology;  Laterality: N/A;   Right knee replacement  06/2010   SPACE OAR INSTILLATION N/A 02/11/2023   Procedure: SPACE OAR INSTILLATION;  Surgeon: Malen Gauze, MD;  Location: [facility];  Service: Urology;  Laterality: N/A;   TOTAL KNEE ARTHROPLASTY  05/18/2012   Procedure: TOTAL KNEE ARTHROPLASTY;  Surgeon: Loreta Ave, MD;  Location: [facility];  Service: Orthopedics;  Laterality: Left;  left total knee arthroplasty   UMBILICAL HERNIA REPAIR     Family History  Problem Relation Age of Onset   Coronary artery disease Brother 87       Premature   Hypertension Brother    Hypertension Father    Cancer Father 35       colon    Hypertension Mother    Diabetes Brother    Hypertension Sister    Hypertension Sister    Hypertension Sister    Hypertension Sister    Social History   Socioeconomic History   Marital status: Married    Spouse name: Psychologist, forensic   Number of children: 0   Years of education: Not on file   Highest education level: Not on file  Occupational History    Employer: [employer]   Tobacco Use   Smoking status: Former    Current packs/day: 0.50    Types: Cigarettes   Smokeless tobacco: Never  Vaping Use   Vaping status: Never Used  Substance and Sexual Activity   Alcohol use: No   Drug use: No   Sexual activity: Yes  Other Topics Concern   Not on file  Social History Narrative   Married x 36 yrs in 2022.   Social Determinants of Health   Financial Resource Strain: Low Risk  (08/04/2023)   Overall Financial Resource Strain (CARDIA)    Difficulty of Paying Living Expenses: Not hard at all  Food Insecurity: No Food Insecurity (08/04/2023)   Hunger Vital Sign  Worried About Programme researcher, broadcasting/film/video in the Last Year: Never true    Ran Out of Food in the Last Year: Never true  Transportation Needs: No Transportation Needs (08/04/2023)   PRAPARE - Administrator, Civil Service (Medical): No    Lack of Transportation (Non-Medical): No  Physical Activity: Sufficiently Active (08/04/2023)   Exercise Vital Sign    Days of Exercise per Week: 5 days    Minutes of Exercise per Session: 30 min  Stress: No Stress Concern Present (08/04/2023)   [employer] of Occupational Health - Occupational Stress Questionnaire    Feeling of Stress : Not at all  Social Connections: Socially Integrated (08/04/2023)   Social Connection and Isolation Panel [NHANES]    Frequency of Communication with Friends and Family: More than three times a week    Frequency of Social Gatherings with Friends and Family: Three times a week    Attends Religious Services: More than 4 times per year    Active Member of Clubs or Organizations: Yes    Attends Engineer, structural: More than 4 times per year    Marital Status: Married    Tobacco Counseling Counseling given: Not Answered   Clinical Intake:  Pre-visit preparation completed: Yes  Pain : No/denies pain  Diabetes: Yes CBG done?: No Did pt. bring in CBG monitor from home?: No  How often do you need to  have someone help you when you read instructions, pamphlets, or other written materials from your doctor or pharmacy?: 1 - Never  Interpreter Needed?: No  Information entered by :: Kandis Fantasia LPN   Activities of Daily Living    08/04/2023   11:04 AM 02/11/2023    8:36 AM  In your present state of health, do you have any difficulty performing the following activities:  Hearing? 0 0  Vision? 0 0  Difficulty concentrating or making decisions? 0 0  Walking or climbing stairs? 0 0  Dressing or bathing? 0 0  Doing errands, shopping? 0   Preparing Food and eating ? N   Using the Toilet? N   In the past six months, have you accidently leaked urine? N   Do you have problems with loss of bowel control? N   Managing your Medications? N   Managing your Finances? N   Housekeeping or managing your Housekeeping? N     Patient Care Team: Kerri Perches, MD as PCP - General Waymon Budge, MD as Consulting Physician (Pulmonary Disease) Cherlyn Cushing, RN as Oncology Nurse Navigator McKenzie, [name], MD as Consulting Physician (Urology) Freddie Breech, DPM as Consulting Physician (Podiatry)  Indicate any recent Medical Services you may have received from other than [facility] providers in the past year (date may be approximate).     Assessment:   This is a routine wellness examination for Alexius.  Hearing/Vision screen Hearing Screening - Comments:: Denies hearing difficulties   Vision Screening - Comments:: Wears rx glasses - up to date with routine eye exams with Mollie [facility] (to establish with new for 2025)     Goals Addressed   None   Depression Screen    08/04/2023   11:07 AM 07/13/2023    9:41 AM 01/29/2023    8:05 AM 12/14/2022   10:48 AM 09/30/2022    8:32 AM 07/21/2022    8:51 AM 03/18/2022    8:33 AM  PHQ 2/9 Scores  PHQ - 2 Score 0 0 0 0 0 0 0  Fall Risk    08/04/2023   11:07 AM 07/13/2023    9:41 AM 01/29/2023    8:05 AM 09/30/2022    8:32 AM 03/18/2022     8:33 AM  Fall Risk   Falls in the past year? 0 0 0 0 0  Number falls in past yr: 0 0 0 0 0  Injury with Fall? 0 0 0 0 0  Risk for fall due to : No Fall Risks No Fall Risks No Fall Risks  No Fall Risks  Follow up Falls prevention discussed;Education provided;Falls evaluation completed Falls evaluation completed Falls evaluation completed  Falls evaluation completed    MEDICARE RISK AT HOME: Medicare Risk at Home Any stairs in or around the home?: No If so, are there any without handrails?: No Home free of loose throw rugs in walkways, pet beds, electrical cords, etc?: Yes Adequate lighting in your home to reduce risk of falls?: Yes Life alert?: No Use of a cane, walker or w/c?: No Grab bars in the bathroom?: Yes Shower chair or bench in shower?: No Elevated toilet seat or a handicapped toilet?: Yes  TIMED UP AND GO:  Was the test performed?  No    Cognitive Function:        08/04/2023   11:07 AM 06/11/2020   10:40 AM 05/30/2019    8:56 AM  6CIT Screen  What Year? 0 points 0 points 4 points  What month? 0 points 0 points 0 points  What time? 0 points 0 points 0 points  Count back from 20 0 points 0 points 0 points  Months in reverse 0 points 0 points 0 points  Repeat phrase 0 points 0 points 0 points  Total Score 0 points 0 points 4 points    Immunizations Immunization History  Administered Date(s) Administered   Fluad Quad(high Dose 65+) 07/21/2022   Fluad Trivalent(High Dose 65+) 07/13/2023   Influenza Split 08/24/2011, 05/30/2012   Influenza Whole 08/18/2005, 06/26/2010   Influenza,inj,Quad PF,6+ Mos 06/23/2013, 06/19/2014, 07/14/2016, 09/01/2017, 05/09/2018, 05/08/2019, 06/14/2020   Influenza-Unspecified 05/31/2015, 06/18/2021   MODERNA COVID-19 SARS-COV-2 PEDS BIVALENT BOOSTER 32yr-34yr 04/02/2022, 07/08/2023   Moderna Covid-19 Vaccine Bivalent Booster 69yrs & up 10/08/2021   Moderna Sars-Covid-2 Vaccination 11/30/2019, 12/29/2019, 07/18/2020   PNEUMOCOCCAL  CONJUGATE-20 03/18/2022   Pneumococcal Conjugate-13 10/23/2014   Pneumococcal Polysaccharide-23 02/16/2005, 05/20/2012   Td 02/16/2005   Tdap 02/27/2015   Zoster Recombinant(Shingrix) 10/08/2021, 01/23/2022    TDAP status: Up to date  Flu Vaccine status: Up to date  Pneumococcal vaccine status: Up to date  Covid-19 vaccine status: Information provided on how to obtain vaccines.   Qualifies for Shingles Vaccine? Yes   Zostavax completed No   Shingrix Completed?: Yes  Screening Tests Health Maintenance  Topic Date Due   COVID-19 Vaccine (7 - 2023-24 season) 09/02/2023   HEMOGLOBIN A1C  01/11/2024   OPHTHALMOLOGY EXAM  02/25/2024   Diabetic kidney evaluation - eGFR measurement  07/12/2024   Diabetic kidney evaluation - Urine ACR  07/12/2024   Medicare Annual Wellness (AWV)  08/03/2024   DTaP/Tdap/Td (3 - Td or Tdap) 02/26/2025   Colonoscopy  03/23/2028   Pneumonia Vaccine 48+ Years old  Completed   INFLUENZA VACCINE  Completed   Hepatitis C Screening  Completed   Zoster Vaccines- Shingrix  Completed   HPV VACCINES  Aged Out   FOOT EXAM  Discontinued    Health Maintenance  There are no preventive care reminders to display for this patient.   Colorectal  cancer screening: Type of screening: Colonoscopy. Completed 03/23/18. Repeat every 10 years  Lung Cancer Screening: (Low Dose CT Chest recommended if Age 22-80 years, 20 pack-year currently smoking OR have quit w/in 15years.) does not qualify.   Lung Cancer Screening Referral: n/a  Additional Screening:  Hepatitis C Screening: does qualify; Completed 07/01/15  Vision Screening: Recommended annual ophthalmology exams for early detection of glaucoma and other disorders of the eye. Is the patient up to date with their annual eye exam?  Yes  Who is the provider or what is the name of the office in which the patient attends annual eye exams? [facility] (to establish with new for 2025) If pt is not established with a  provider, would they like to be referred to a provider to establish care? No .   Dental Screening: Recommended annual dental exams for proper oral hygiene  Community Resource Referral / Chronic Care Management: CRR required this visit?  No   CCM required this visit?  No     Plan:     I have personally reviewed and noted the following in the patient's chart:   Medical and social history Use of alcohol, tobacco or illicit drugs  Current medications and supplements including opioid prescriptions. Patient is not currently taking opioid prescriptions. Functional ability and status Nutritional status Physical activity Advanced directives List of other physicians Hospitalizations, surgeries, and ER visits in previous 12 months Vitals Screenings to include cognitive, depression, and falls Referrals and appointments  In addition, I have reviewed and discussed with patient certain preventive protocols, quality metrics, and best practice recommendations. A written personalized care plan for preventive services as well as general preventive health recommendations were provided to patient.     Kandis Fantasia [place], [place]   [id number]   After Visit Summary: (MyChart) Due to this being a telephonic visit, the after visit summary with patients personalized plan was offered to patient via MyChart   Nurse Notes: No concerns at this time

## 2023-08-08 NOTE — Progress Notes (Signed)
  Subjective:  Patient ID: Christopher Lambert, male    DOB: 06/14/1956,  MRN: [id number]  67 y.o. male presents painful thick toenails that are difficult to trim. Pain interferes with ambulation. Aggravating factors include wearing enclosed shoe gear. Pain is relieved with periodic professional debridement.  Chief Complaint  Patient presents with   Diabetes    DFC BS - DON'T HAVE TO CHECK IT A1C- DK  LVPCP - 06/2023   New problem(s): None   PCP is Kerri Perches, MD.  No Known Allergies  Review of Systems: Negative except as noted in the HPI.   Objective:  Christopher Lambert is a pleasant 67 y.o. male WD, WN in NAD.Marland Kitchen AAO x 3.  Vascular Examination: Vascular status intact b/l with palpable pedal pulses. CFT immediate b/l. Pedal hair present. No edema. No pain with calf compression b/l. Skin temperature gradient WNL b/l. No varicosities noted. No cyanosis or clubbing noted.  Neurological Examination: Sensation grossly intact b/l with 10 gram monofilament. Vibratory sensation intact b/l.  Dermatological Examination: Pedal skin with normal turgor, texture and tone b/l. No open wounds nor interdigital macerations noted. Toenails 1-5 b/l thick, discolored, elongated with subungual debris and pain on dorsal palpation. No hyperkeratotic lesions noted b/l.   Musculoskeletal Examination: Normal muscle strength 5/5 to all lower extremity muscle groups bilaterally. Pes planus deformity noted bilateral LE.Marland Kitchen No pain, crepitus or joint limitation noted with ROM b/l LE.  Patient ambulates independently without assistive aids.  Radiographs: None  Last A1c:      Latest Ref Rng & Units 07/13/2023   10:32 AM 01/29/2023    8:39 AM 09/30/2022    1:03 PM  Hemoglobin A1C  Hemoglobin-A1c 4.8 - 5.6 % 6.6  6.5  6.4    Assessment:   1. Pain due to onychomycosis of toenails of both feet    Plan:  -Patient was evaluated today. All questions/concerns addressed on today's visit. -Continue supportive  shoe gear daily. -Toenails 1-5 b/l were debrided in length and girth with sterile nail nippers and dremel without iatrogenic bleeding.  -Patient/POA to call should there be question/concern in the interim.  Return in about 3 months (around 11/03/2023).  Freddie Breech, DPM      [place] LOCATION: [address], [place] [address]                   Office [phone]   [facility] LOCATION: [address] [place], [place] [address] Office [phone]

## 2023-08-20 ENCOUNTER — Other Ambulatory Visit: Payer: Medicare Other

## 2023-08-20 DIAGNOSIS — C61 Malignant neoplasm of prostate: Secondary | ICD-10-CM

## 2023-08-21 LAB — PSA: Prostate Specific Ag, Serum: 1.2 ng/mL (ref 0.0–4.0)

## 2023-08-27 ENCOUNTER — Ambulatory Visit: Payer: Medicare Other | Admitting: Urology

## 2023-08-27 VITALS — BP 134/71 | HR 76

## 2023-08-27 DIAGNOSIS — C61 Malignant neoplasm of prostate: Secondary | ICD-10-CM

## 2023-08-27 DIAGNOSIS — N401 Enlarged prostate with lower urinary tract symptoms: Secondary | ICD-10-CM

## 2023-08-27 DIAGNOSIS — R350 Frequency of micturition: Secondary | ICD-10-CM

## 2023-08-27 DIAGNOSIS — N138 Other obstructive and reflux uropathy: Secondary | ICD-10-CM

## 2023-08-27 LAB — URINALYSIS, ROUTINE W REFLEX MICROSCOPIC
Bilirubin, UA: NEGATIVE
Glucose, UA: NEGATIVE
Ketones, UA: NEGATIVE
Leukocytes,UA: NEGATIVE
Nitrite, UA: NEGATIVE
Protein,UA: NEGATIVE
Specific Gravity, UA: 1.02 (ref 1.005–1.030)
Urobilinogen, Ur: 0.2 mg/dL (ref 0.2–1.0)
pH, UA: 7.5 (ref 5.0–7.5)

## 2023-08-27 LAB — MICROSCOPIC EXAMINATION
Bacteria, UA: NONE SEEN
WBC, UA: NONE SEEN /[HPF] (ref 0–5)

## 2023-08-27 NOTE — Progress Notes (Unsigned)
08/27/2023 11:58 AM   Christopher Lambert [birth year] [id number]  Referring provider: Kerri Perches, MD [address], [address] [place],  [place] [address]  Followup prostate cancer   HPI: Christopher Lambert is a 2UU here for followup for prostate cancer and evaluation of urinary frequency. PSA decreased 1.2 from 1.8. He has worsening urinary urgency and frequency since last visit. He is not bothered by his frequency. Urine stream is strong. No straining to urinate. Nocturia 1-2x.    PMH: Past Medical History:  Diagnosis Date   Abnormal cardiovascular function study 09/21/2011   Arthritis of knee    Bilateral   Cancer (HCC)    Diabetes mellitus 06/2008   type 2 NIDDM x 7 yrs; off meds   Diabetes mellitus without complication (HCC)    Phreesia 08/18/2020   ED (erectile dysfunction)    Essential hypertension, benign 1990   H/O: whooping cough    aschild   Hemorrhoids, internal    Hypertension    Phreesia 08/18/2020   Metabolic syndrome 01/23/2012   Obesity    Obstructive sleep apnea 11/09/2007   NPSG 08/26/04 AHI 92/hr, [place], [place] NPSG 01/31/15- severe OSA AHI 49.4/ hr, CPAP suggested 13, AHI 0.0, weight 294 lbs CPAP Advanced     Palpitations 08/24/2011   Piles (hemorrhoids) 06/19/2014   Seasonal allergies    Sleep apnea 2004   wears CPAP nightly      Surgical History: Past Surgical History:  Procedure Laterality Date   CARDIAC CATHETERIZATION  09/25/2011   CARPAL TUNNEL RELEASE     Right    COLONOSCOPY N/A 03/23/2018   Procedure: COLONOSCOPY;  Surgeon: Corbin Ade, MD;  [facility];  Service: Endoscopy;  Laterality: N/A;  8:00   ENDOVENOUS ABLATION SAPHENOUS VEIN W/ LASER Right 10/10/2020   endovenous laser ablation right greater saphenous vein by Cari Caraway MD    HERNIA REPAIR  2006 approx   umbilical   JOINT REPLACEMENT  2011   right knee   JOINT REPLACEMENT  2013   left knee   left knee replacement   sept 2013   POLYPECTOMY  03/23/2018   Procedure:  POLYPECTOMY;  Surgeon: Corbin Ade, MD;  [facility]: [facility];  Service: Endoscopy;;  colon   RADIOACTIVE SEED IMPLANT N/A 02/11/2023   Procedure: RADIOACTIVE SEED IMPLANT/BRACHYTHERAPY IMPLANT;  Surgeon: Malen Gauze, MD;  Location: [facility];  Service: Urology;  Laterality: N/A;   Right knee replacement  06/2010   SPACE OAR INSTILLATION N/A 02/11/2023   Procedure: SPACE OAR INSTILLATION;  Surgeon: Malen Gauze, MD;  Location: [facility];  Service: Urology;  Laterality: N/A;   TOTAL KNEE ARTHROPLASTY  05/18/2012   Procedure: TOTAL KNEE ARTHROPLASTY;  Surgeon: Loreta Ave, MD;  Location: [facility];  Service: Orthopedics;  Laterality: Left;  left total knee arthroplasty   UMBILICAL HERNIA REPAIR      Home Medications:  Allergies as of 08/27/2023   No Known Allergies      Medication List        Accurate as of August 27, 2023 11:58 AM. If you have any questions, ask your nurse or doctor.          allopurinol 300 MG tablet Commonly known as: ZYLOPRIM TAKE 1 TABLET BY MOUTH EVERY DAY   aspirin EC 81 MG tablet TAKE 1 TABLET EVERY DAY   cholecalciferol 1000 units tablet Commonly known as: VITAMIN D Take 1,000 Units by mouth daily.  ELDERBERRY PO Take 1 tablet by mouth daily.   fluticasone 50 MCG/ACT nasal spray Commonly known as: FLONASE PLACE 2 SPRAYS INTO BOTH NOSTRILS 2 (TWO) TIMES DAILY   furosemide 20 MG tablet Commonly known as: LASIX TAKE 1 TABLET BY MOUTH EVERY DAY   losartan 25 MG tablet Commonly known as: COZAAR TAKE 1 TABLET BY MOUTH EVERY DAY   meloxicam 15 MG tablet Commonly known as: MOBIC TAKE 1 TABLET (15 MG TOTAL) BY MOUTH DAILY.   metFORMIN 500 MG tablet Commonly known as: GLUCOPHAGE TAKE 1 TABLET BY MOUTH EVERY DAY WITH BREAKFAST   multivitamin with minerals Tabs tablet Take 1 tablet by mouth daily.   NIFEdipine 90 MG 24 hr tablet Commonly known as: PROCARDIA XL/NIFEDICAL-XL TAKE 1  TABLET BY MOUTH EVERY DAY   potassium chloride 10 MEQ tablet Commonly known as: KLOR-CON TAKE 2 TABLETS BY MOUTH EVERY DAY   simvastatin 5 MG tablet Commonly known as: ZOCOR TAKE 1 TABLET BY MOUTH EVERYDAY AT BEDTIME   traMADol 50 MG tablet Commonly known as: Ultram Take 1 tablet (50 mg total) by mouth every 6 (six) hours as needed.        Allergies: No Known Allergies  Family History: Family History  Problem Relation Age of Onset   Coronary artery disease Brother 65       Premature   Hypertension Brother    Hypertension Father    Cancer Father 36       colon    Hypertension Mother    Diabetes Brother    Hypertension Sister    Hypertension Sister    Hypertension Sister    Hypertension Sister     Social History:  reports that he has quit smoking. His smoking use included cigarettes. He has never used smokeless tobacco. He reports that he does not drink alcohol and does not use drugs.  ROS: All other review of systems were reviewed and are negative except what is noted above in HPI  Physical Exam: BP 134/71   Pulse 76   Constitutional:  Alert and oriented, No acute distress. HEENT: [place] AT, moist mucus membranes.  Trachea midline, no masses. Cardiovascular: No clubbing, cyanosis, or edema. Respiratory: Normal respiratory effort, no increased work of breathing. GI: Abdomen is soft, nontender, nondistended, no abdominal masses GU: No CVA tenderness.  Lymph: No cervical or inguinal lymphadenopathy. Skin: No rashes, bruises or suspicious lesions. Neurologic: Grossly intact, no focal deficits, moving all 4 extremities. Psychiatric: Normal mood and affect.  Laboratory Data: Lab Results  Component Value Date   WBC 4.3 09/30/2022   HGB 15.0 02/11/2023   HCT 44.0 02/11/2023   MCV 91 09/30/2022   PLT 219 09/30/2022    Lab Results  Component Value Date   CREATININE 1.01 07/13/2023    Lab Results  Component Value Date   PSA 2.7 11/30/2019   PSA 4.5 (H)  04/11/2019   PSA 3.0 09/20/2017    No results found for: "TESTOSTERONE"  Lab Results  Component Value Date   HGBA1C 6.6 (H) 07/13/2023    Urinalysis    Component Value Date/Time   COLORURINE AMBER (A) 04/16/2019 1642   APPEARANCEUR Clear 05/21/2023 1128   LABSPEC 1.019 04/16/2019 1642   PHURINE 5.0 04/16/2019 1642   GLUCOSEU Negative 05/21/2023 1128   HGBUR LARGE (A) 04/16/2019 1642   BILIRUBINUR Negative 05/21/2023 1128   KETONESUR NEGATIVE 04/16/2019 1642   PROTEINUR Negative 05/21/2023 1128   PROTEINUR 30 (A) 04/16/2019 1642   UROBILINOGEN 1.0 06/22/2016 1310  UROBILINOGEN 0.2 05/12/2012 0901   NITRITE Negative 05/21/2023 1128   NITRITE NEGATIVE 04/16/2019 1642   LEUKOCYTESUR Negative 05/21/2023 1128   LEUKOCYTESUR MODERATE (A) 04/16/2019 1642    Lab Results  Component Value Date   LABMICR 4.7 07/13/2023   WBCUA 0-5 05/21/2023   LABEPIT 0-10 05/21/2023   MUCUS Present 04/14/2022   BACTERIA None seen 05/21/2023    Pertinent Imaging:  No results found for this or any previous visit.  No results found for this or any previous visit.  No results found for this or any previous visit.  No results found for this or any previous visit.  No results found for this or any previous visit.  No results found for this or any previous visit.  No results found for this or any previous visit.  Results for orders placed during the hospital encounter of 04/16/19  CT RENAL STONE STUDY  Narrative CLINICAL DATA:  Weakness fever body aches  EXAM: CT ABDOMEN AND PELVIS WITHOUT CONTRAST  TECHNIQUE: Multidetector CT imaging of the abdomen and pelvis was performed following the standard protocol without IV contrast.  COMPARISON:  August 12, 2015  FINDINGS: The study is limited due to patient motion.  Lower chest: The visualized heart size within normal limits. No pericardial fluid/thickening.  No hiatal hernia.  The visualized portions of the lungs are  clear.  Hepatobiliary: Although limited due to the lack of intravenous contrast, normal in appearance without gross focal abnormality. No evidence of calcified gallstones or biliary ductal dilatation.  Pancreas:  Unremarkable.  No surrounding inflammatory changes.  Spleen: Normal in size. Although limited due to the lack of intravenous contrast, normal in appearance.  Adrenals/Urinary Tract: Both adrenal glands appear normal. There is perinephric stranding seen around both kidneys and proximal ureteral stranding. Again noted are bilateral partially at exophytic low-density lesions, the largest is off the lower pole of the right kidney measuring 4.4 cm. The bladder is partially decompressed with diffuse bladder wall thickening. No calculi are noted.  Stomach/Bowel: The stomach, small bowel, and colon are normal in appearance. No inflammatory changes or obstructive findings. appendix is normal.  Vascular/Lymphatic: There are no enlarged abdominal or pelvic lymph nodes. No significant gross vascular findings are present.  Reproductive: The prostate is unremarkable.  Other: No evidence of abdominal wall mass or hernia.  Musculoskeletal: No acute or significant osseous findings.  IMPRESSION: 1. Bilateral perinephric and proximal periureteral stranding with diffuse mild bladder wall thickening. This could be due to bilateral pyelonephritis and chronic cystitis. 2. No renal calculi or hydronephrosis 3. Bilateral simple renal cysts.   Electronically Signed By: Jonna Clark M.D. On: 04/17/2019 17:11   Assessment & Plan:    1. Prostate cancer (HCC) (Primary) -followup 6 months with PSA - Urinalysis, Routine w reflex microscopic  2. Benign prostatic hyperplasia with urinary obstruction Patient defers therapy at this time  3. Urinary frequency Patient defers therapy at this time   No follow-ups on file.  Wilkie Aye, MD  [facility] [place]

## 2023-08-31 ENCOUNTER — Encounter: Payer: Self-pay | Admitting: Urology

## 2023-08-31 NOTE — Patient Instructions (Signed)

## 2023-09-04 ENCOUNTER — Other Ambulatory Visit: Payer: Self-pay | Admitting: Family Medicine

## 2023-09-09 ENCOUNTER — Other Ambulatory Visit: Payer: Self-pay | Admitting: Family Medicine

## 2023-10-03 ENCOUNTER — Other Ambulatory Visit: Payer: Self-pay | Admitting: Family Medicine

## 2023-11-04 ENCOUNTER — Other Ambulatory Visit: Payer: Self-pay | Admitting: Family Medicine

## 2023-11-05 ENCOUNTER — Other Ambulatory Visit: Payer: Self-pay | Admitting: Family Medicine

## 2023-11-05 ENCOUNTER — Other Ambulatory Visit: Payer: Self-pay | Admitting: Podiatry

## 2023-11-12 ENCOUNTER — Ambulatory Visit: Payer: Medicare Other | Admitting: Family Medicine

## 2023-11-12 ENCOUNTER — Encounter: Payer: Self-pay | Admitting: Family Medicine

## 2023-11-12 VITALS — BP 137/79 | HR 80 | Resp 16 | Ht 67.5 in | Wt 272.0 lb

## 2023-11-12 DIAGNOSIS — Z6841 Body Mass Index (BMI) 40.0 and over, adult: Secondary | ICD-10-CM

## 2023-11-12 DIAGNOSIS — E790 Hyperuricemia without signs of inflammatory arthritis and tophaceous disease: Secondary | ICD-10-CM | POA: Diagnosis not present

## 2023-11-12 DIAGNOSIS — E785 Hyperlipidemia, unspecified: Secondary | ICD-10-CM

## 2023-11-12 DIAGNOSIS — I10 Essential (primary) hypertension: Secondary | ICD-10-CM

## 2023-11-12 DIAGNOSIS — Z0001 Encounter for general adult medical examination with abnormal findings: Secondary | ICD-10-CM

## 2023-11-12 DIAGNOSIS — E1169 Type 2 diabetes mellitus with other specified complication: Secondary | ICD-10-CM

## 2023-11-12 NOTE — Assessment & Plan Note (Signed)
  Patient re-educated about  the importance of commitment to a  minimum of 150 minutes of exercise per week as able.  The importance of healthy food choices with portion control discussed, as well as eating regularly and within a 12 hour window most days. The need to choose "clean , green" food 50 to 75% of the time is discussed, as well as to make water the primary drink and set a goal of 64 ounces water daily.       11/12/2023    9:39 AM 08/04/2023   11:03 AM 07/13/2023    9:40 AM  Weight /BMI  Weight 272 lb 289 lb 289 lb 1.9 oz  Height 5' 7.5" (1.715 m) 5\' 7"  (1.702 m) 5\' 7"  (1.702 m)  BMI 41.97 kg/m2 45.26 kg/m2 45.28 kg/m2   Imp[roved

## 2023-11-12 NOTE — Assessment & Plan Note (Signed)
 Hyperlipidemia:Low fat diet discussed and encouraged.   Lipid Panel  Lab Results  Component Value Date   CHOL 141 07/13/2023   HDL 41 07/13/2023   LDLCALC 84 07/13/2023   TRIG 80 07/13/2023   CHOLHDL 3.4 07/13/2023     Updated lab needed at/ before next visit.

## 2023-11-12 NOTE — Patient Instructions (Signed)
 Follow-up return in September, call if you need me sooner.    fASTING LABS AS ORDERED TODAY  It is important that you exercise regularly at least 30 minutes 5 times a week. If you develop chest pain, have severe difficulty breathing, or feel very tired, stop exercising immediately and seek medical attention   Think about what you will eat, plan ahead. Choose " clean, green, fresh or frozen" over canned, processed or packaged foods which are more sugary, salty and fatty. 70 to 75% of food eaten should be vegetables and fruit. Three meals at set times with snacks allowed between meals, but they must be fruit or vegetables. Aim to eat over a 12 hour period , example 7 am to 7 pm, and STOP after  your last meal of the day. Drink water,generally about 64 ounces per day, no other drink is as healthy. Fruit juice is best enjoyed in a healthy way, by EATING the fruit.  COVID VACCINE IS RECOMMENDED AND IS AT YOUR PHARMACY  Thanks for choosing [facility], we consider it a privelige to serve you.

## 2023-11-12 NOTE — Assessment & Plan Note (Signed)
 Updated lab needed at/ before next visit.

## 2023-11-12 NOTE — Assessment & Plan Note (Signed)

## 2023-11-12 NOTE — Progress Notes (Signed)
 Christopher Lambert     MRN: [id number]      DOB: 30-Mar-1956  Chief Complaint  Patient presents with   Annual Exam    HPI: Patient is in for annual physical exam. No other health concerns are expressed or addressed at the visit. Recent labs, if available are reviewed. Immunization is reviewed , and  updated if needed.    PE; Pleasant male, alert and oriented x 3, in no cardio-pulmonary distress. Afebrile. HEENT No facial trauma or asymetry. Sinuses non tender. EOMI External ears normal,  Neck: supple, no adenopathy,JVD or thyromegaly.No bruits.  Chest: Clear to ascultation bilaterally.No crackles or wheezes. Non tender to palpation  Cardiovascular system; Heart sounds normal,  S1 and  S2 ,no S3.  No murmur, or thrill. Apical beat not displaced Peripheral pulses normal.  Abdomen: Soft, non tender, no organomegaly or masses. No bruits. Bowel sounds normal. No guarding, tenderness or rebound.    Musculoskeletal exam: Full ROM of spine, hips , shoulders and knees. No deformity ,swelling or crepitus noted. No muscle wasting or atrophy.   Neurologic: Cranial nerves 2 to 12 intact. Power, tone ,sensation and reflexes normal throughout. No disturbance in gait. No tremor.  Skin: Intact, no ulceration, erythema , scaling or rash noted. Pigmentation normal throughout  Psych; Normal mood and affect. Judgement and concentration normal   Assessment & Plan:  Encounter for Medicare annual examination with abnormal findings Annual exam as documented. Counseling done  re healthy lifestyle involving commitment to 150 minutes exercise per week, heart healthy diet, and attaining healthy weight.The importance of adequate sleep also discussed. Regular seat belt use and home safety, is also discussed. Changes in health habits are decided on by the patient with goals and time frames  set for achieving them. Immunization and cancer screening needs are specifically addressed at this  visit.   Hyperuricemia Updated lab needed at/ before next visit.   Morbid obesity (HCC)  Patient re-educated about  the importance of commitment to a  minimum of 150 minutes of exercise per week as able.  The importance of healthy food choices with portion control discussed, as well as eating regularly and within a 12 hour window most days. The need to choose "clean , green" food 50 to 75% of the time is discussed, as well as to make water the primary drink and set a goal of 64 ounces water daily.       11/12/2023    9:39 AM 08/04/2023   11:03 AM 07/13/2023    9:40 AM  Weight /BMI  Weight 272 lb 289 lb 289 lb 1.9 oz  Height 5' 7.5" (1.715 m) 5\' 7"  (1.702 m) 5\' 7"  (1.702 m)  BMI 41.97 kg/m2 45.26 kg/m2 45.28 kg/m2   Imp[roved   Dyslipidemia, goal LDL below 100 Hyperlipidemia:Low fat diet discussed and encouraged.   Lipid Panel  Lab Results  Component Value Date   CHOL 141 07/13/2023   HDL 41 07/13/2023   LDLCALC 84 07/13/2023   TRIG 80 07/13/2023   CHOLHDL 3.4 07/13/2023     Updated lab needed at/ before next visit.   DM type 2 with diabetic dyslipidemia (HCC) Diabetes associated with hypertension, hyperlipidemia, obesity, and arthritis  Christopher Lambert is reminded of the importance of commitment to daily physical activity for 30 minutes or more, as able and the need to limit carbohydrate intake to 30 to 60 grams per meal to help with blood sugar control.   The need to take medication as prescribed,  test blood sugar as directed, and to call between visits if there is a concern that blood sugar is uncontrolled is also discussed.   Christopher Lambert is reminded of the importance of daily foot exam, annual eye examination, and good blood sugar, blood pressure and cholesterol control.     Latest Ref Rng & Units 07/13/2023   10:32 AM 02/11/2023    9:05 AM 01/29/2023    8:39 AM 09/30/2022    1:03 PM 07/15/2022    9:09 AM  Diabetic Labs  HbA1c 4.8 - 5.6 % 6.6   6.5  6.4  6.5    Micro/Creat Ratio 0 - 29 mg/g creat 6       Chol 100 - 199 mg/dL 782   956  213    HDL >08 mg/dL 41   40  47    Calc LDL 0 - 99 mg/dL 84   76  91    Triglycerides 0 - 149 mg/dL 80   657  846    Creatinine 0.76 - 1.27 mg/dL 9.62  9.52  8.41  3.24  0.91       11/12/2023    9:39 AM 08/27/2023   11:49 AM 08/04/2023   11:03 AM 07/13/2023    9:40 AM 05/21/2023   11:16 AM 05/21/2023   11:15 AM 03/04/2023   10:13 AM  BP/Weight  Systolic BP 137 134 -- 122 137 152 133  Diastolic BP 79 71 -- 77 78 81 85  Wt. (Lbs) 272  289 289.12   285  BMI 41.97 kg/m2  45.26 kg/m2 45.28 kg/m2   44.64 kg/m2      Latest Ref Rng & Units 02/25/2023   12:00 AM 02/24/2021   12:00 AM  Foot/eye exam completion dates  Eye Exam No Retinopathy No Retinopathy     No Retinopathy         This result is from an external source.

## 2023-11-12 NOTE — Assessment & Plan Note (Signed)
 Diabetes associated with hypertension, hyperlipidemia, obesity, and arthritis  [name] is reminded of the importance of commitment to daily physical activity for 30 minutes or more, as able and the need to limit carbohydrate intake to 30 to 60 grams per meal to help with blood sugar control.   The need to take medication as prescribed, test blood sugar as directed, and to call between visits if there is a concern that blood sugar is uncontrolled is also discussed.   [name] is reminded of the importance of daily foot exam, annual eye examination, and good blood sugar, blood pressure and cholesterol control.     Latest Ref Rng & Units 07/13/2023   10:32 AM 02/11/2023    9:05 AM 01/29/2023    8:39 AM 09/30/2022    1:03 PM 07/15/2022    9:09 AM  Diabetic Labs  HbA1c 4.8 - 5.6 % 6.6   6.5  6.4  6.5   Micro/Creat Ratio 0 - 29 mg/g creat 6       Chol 100 - 199 mg/dL 161   096  045    HDL >40 mg/dL 41   40  47    Calc LDL 0 - 99 mg/dL 84   76  91    Triglycerides 0 - 149 mg/dL 80   981  191    Creatinine 0.76 - 1.27 mg/dL 4.78  2.95  6.21  3.08  0.91       11/12/2023    9:39 AM 08/27/2023   11:49 AM 08/04/2023   11:03 AM 07/13/2023    9:40 AM 05/21/2023   11:16 AM 05/21/2023   11:15 AM 03/04/2023   10:13 AM  BP/Weight  Systolic BP 137 134 -- 122 137 152 133  Diastolic BP 79 71 -- 77 78 81 85  Wt. (Lbs) 272  289 289.12   285  BMI 41.97 kg/m2  45.26 kg/m2 45.28 kg/m2   44.64 kg/m2      Latest Ref Rng & Units 02/25/2023   12:00 AM 02/24/2021   12:00 AM  Foot/eye exam completion dates  Eye Exam No Retinopathy No Retinopathy     No Retinopathy         This result is from an external source.

## 2023-11-13 ENCOUNTER — Encounter: Payer: Self-pay | Admitting: Family Medicine

## 2023-11-13 LAB — CBC
Hematocrit: 41 % (ref 37.5–51.0)
Hemoglobin: 13.7 g/dL (ref 13.0–17.7)
MCH: 31.4 pg (ref 26.6–33.0)
MCHC: 33.4 g/dL (ref 31.5–35.7)
MCV: 94 fL (ref 79–97)
Platelets: 210 10*3/uL (ref 150–450)
RBC: 4.37 x10E6/uL (ref 4.14–5.80)
RDW: 13.1 % (ref 11.6–15.4)
WBC: 3.3 10*3/uL — ABNORMAL LOW (ref 3.4–10.8)

## 2023-11-13 LAB — TSH: TSH: 1.69 u[IU]/mL (ref 0.450–4.500)

## 2023-11-13 LAB — HEMOGLOBIN A1C
Est. average glucose Bld gHb Est-mCnc: 134 mg/dL
Hgb A1c MFr Bld: 6.3 % — ABNORMAL HIGH (ref 4.8–5.6)

## 2023-11-13 LAB — LIPID PANEL W/O CHOL/HDL RATIO
Cholesterol, Total: 137 mg/dL (ref 100–199)
HDL: 41 mg/dL (ref >39)
LDL Chol Calc (NIH): 81 mg/dL (ref 0–99)
Triglycerides: 78 mg/dL (ref 0–149)
VLDL Cholesterol Cal: 15 mg/dL (ref 5–40)

## 2023-11-13 LAB — URIC ACID: Uric Acid: 4.1 mg/dL (ref 3.8–8.4)

## 2023-11-16 NOTE — Progress Notes (Signed)
 EGFR added , cmp can not be added due to the shelf life of the blood

## 2023-11-19 LAB — SPECIMEN STATUS REPORT

## 2023-11-19 LAB — GLOM FILT RATE, ESTIMATED
Creatinine, Ser: 1.04 mg/dL (ref 0.76–1.27)
eGFR: 79 mL/min/{1.73_m2} (ref >59)

## 2023-12-07 ENCOUNTER — Other Ambulatory Visit: Payer: Self-pay | Admitting: Family Medicine

## 2023-12-08 ENCOUNTER — Encounter: Payer: Self-pay | Admitting: Podiatry

## 2023-12-08 ENCOUNTER — Ambulatory Visit: Payer: Medicare Other | Admitting: Podiatry

## 2023-12-08 VITALS — Ht 67.5 in | Wt 272.0 lb

## 2023-12-08 DIAGNOSIS — M79675 Pain in left toe(s): Secondary | ICD-10-CM | POA: Diagnosis not present

## 2023-12-08 DIAGNOSIS — B351 Tinea unguium: Secondary | ICD-10-CM

## 2023-12-08 DIAGNOSIS — M79674 Pain in right toe(s): Secondary | ICD-10-CM

## 2023-12-12 ENCOUNTER — Ambulatory Visit
Admission: EM | Admit: 2023-12-12 | Discharge: 2023-12-12 | Disposition: A | Discharge status: Home / Self Care | Attending: Nurse Practitioner | Admitting: Nurse Practitioner

## 2023-12-12 DIAGNOSIS — S61012A Laceration without foreign body of left thumb without damage to nail, initial encounter: Secondary | ICD-10-CM

## 2023-12-12 NOTE — ED Triage Notes (Signed)
 Pt reports laceration to the left thumb pt states he was cutting ham and sliced his thumb.

## 2023-12-12 NOTE — Progress Notes (Signed)
  Subjective:  Patient ID: Christopher Lambert, male    DOB: 28-Aug-1956,  MRN: [id number]  68 y.o. male presents painful, elongated thickened toenails x 10 which are symptomatic when wearing enclosed shoe gear. This interferes with his/her daily activities.  Chief Complaint  Patient presents with   Nail Problem    Pt is here for [facility] unsure of last A1C PCP is Dr Lodema Hong and LOV was a few weeks ago.    New problem(s): None   PCP is Kerri Perches, MD.  No Known Allergies  Review of Systems: Negative except as noted in the HPI.   Objective:  Christopher Lambert is a pleasant 68 y.o. male WD, WN in NAD. AAO x 3.  Vascular Examination: Vascular status intact b/l with palpable pedal pulses. CFT immediate b/l. Pedal hair present. No edema. No pain with calf compression b/l. Skin temperature gradient WNL b/l. No varicosities noted. No cyanosis or clubbing noted.  Neurological Examination: Sensation grossly intact b/l with 10 gram monofilament. Vibratory sensation intact b/l.  Dermatological Examination: Pedal skin with normal turgor, texture and tone b/l. No open wounds nor interdigital macerations noted. Toenails 1-5 b/l thick, discolored, elongated with subungual debris and pain on dorsal palpation. No hyperkeratotic lesions noted b/l.   Musculoskeletal Examination: Muscle strength 5/5 to b/l LE.  No pain, crepitus noted b/l. No gross pedal deformities. Patient ambulates independently without assistive aids.   Radiographs: None  Last A1c:      Latest Ref Rng & Units 11/12/2023   10:44 AM 07/13/2023   10:32 AM 01/29/2023    8:39 AM  Hemoglobin A1C  Hemoglobin-A1c 4.8 - 5.6 % 6.3  6.6  6.5      Assessment:   1. Pain due to onychomycosis of toenails of both feet    Plan:  Consent given for treatment. Patient examined. All patient's and/or POA's questions/concerns addressed on today's visit. Mycotic toenails 1-5 debrided in length and girth without incident. Continue soft, supportive  shoe gear daily. Report any pedal injuries to medical professional. Call office if there are any quesitons/concerns.  Return in about 3 months (around 03/09/2024).  Freddie Breech, DPM      [facility] LOCATION: [address], [place] [address]                   Office [phone]   [facility] LOCATION: [address] [place], [place] [address] Office [phone]

## 2023-12-12 NOTE — Discharge Instructions (Addendum)
 Keep the dressing in place for the next 24 hours. Cleanse the area twice daily with warm soap and water. You may apply Neosporin to the area as needed. Apply ice to the left thumb to help with pain and swelling.  Apply for 20 minutes, remove for 1 hour, repeat as needed. You may take over-the-counter Tylenol as needed for pain or discomfort. Keep the area covered when you are out in public.  When you are home, may leave the area open to air to promote healing.  If you have pets in the home, keep pets away from your injury. Monitor the area for signs of infection.  This includes increased swelling, redness, or foul-smelling drainage from the site.  If you experience any of the symptoms, follow-up immediately. Follow-up as needed.

## 2023-12-12 NOTE — ED Provider Notes (Addendum)
 [facility]    CSN: [id number] Arrival date & time: 12/12/23  1209      History   Chief Complaint No chief complaint on file.   HPI Christopher Lambert is a 68 y.o. male.   The history is provided by the patient.   Patient presents with a laceration to the left thumb that occurred last evening around 10 PM when he was slicing hand.  Patient states that he was able to control the bleeding, clean the finger, and use peroxide.  Denies numbness, tingling, or decreased range of motion.  He is right-hand dominant.  Denies use of blood thinning medications, but per review of medications, patient does take aspirin daily.  States that his tetanus shot is up-to-date.  Past Medical History:  Diagnosis Date   Abnormal cardiovascular function study 09/21/2011   Arthritis of knee    Bilateral   Cancer (HCC)    Diabetes mellitus 06/2008   type 2 NIDDM x 7 yrs; off meds   Diabetes mellitus without complication (HCC)    Phreesia 08/18/2020   ED (erectile dysfunction)    Essential hypertension, benign 1990   H/O: whooping cough    aschild   Hemorrhoids, internal    Hypertension    Phreesia 08/18/2020   Metabolic syndrome 01/23/2012   Obesity    Obstructive sleep apnea 11/09/2007   NPSG 08/26/04 AHI 92/hr, [place], [place] NPSG 01/31/15- severe OSA AHI 49.4/ hr, CPAP suggested 13, AHI 0.0, weight 294 lbs CPAP Advanced     Palpitations 08/24/2011   Piles (hemorrhoids) 06/19/2014   Seasonal allergies    Sleep apnea 2004   wears CPAP nightly      Patient Active Problem List   Diagnosis Date Noted   Chronic pain in right foot 07/13/2023   Hyperuricemia 07/13/2023   Prostate cancer (HCC) 12/14/2022   Encounter for Medicare annual examination with abnormal findings 10/01/2022   Vitamin D deficiency 10/01/2022   Chronic right hip pain 07/21/2022   Varicose veins of leg with swelling, 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   Dyslipidemia, goal LDL below 100 06/19/2014   DM type 2 with diabetic dyslipidemia  (HCC) 10/15/2013   Morbid obesity (HCC) 12/02/2007   Essential hypertension, benign 11/09/2007   Seasonal allergic rhinitis 11/09/2007   Obstructive sleep apnea 11/09/2007    Past Surgical History:  Procedure Laterality Date   CARDIAC CATHETERIZATION  09/25/2011   CARPAL TUNNEL RELEASE     Right    COLONOSCOPY N/A 03/23/2018   Procedure: COLONOSCOPY;  Surgeon: Corbin Ade, MD;  [facility];  Service: Endoscopy;  Laterality: N/A;  8:00   ENDOVENOUS ABLATION SAPHENOUS VEIN W/ LASER Right 10/10/2020   endovenous laser ablation right greater saphenous vein by Cari Caraway MD    HERNIA REPAIR  2006 approx   umbilical   JOINT REPLACEMENT  2011   right knee   JOINT REPLACEMENT  2013   left knee   left knee replacement   sept 2013   POLYPECTOMY  03/23/2018   Procedure: POLYPECTOMY;  Surgeon: Corbin Ade, MD;  [facility];  Service: Endoscopy;;  colon   RADIOACTIVE SEED IMPLANT N/A 02/11/2023   Procedure: RADIOACTIVE SEED IMPLANT/BRACHYTHERAPY IMPLANT;  Surgeon: Malen Gauze, MD;  Location: [facility];  Service: Urology;  Laterality: N/A;   Right knee replacement  06/2010   SPACE OAR INSTILLATION N/A 02/11/2023   Procedure: SPACE OAR INSTILLATION;  Surgeon: Malen Gauze, MD;  Location: [facility];  Service: Urology;  Laterality: N/A;   TOTAL KNEE ARTHROPLASTY  05/18/2012   Procedure: TOTAL KNEE ARTHROPLASTY;  Surgeon: Loreta Ave, MD;  Location: [facility] OR;  Service: Orthopedics;  Laterality: Left;  left total knee arthroplasty   UMBILICAL HERNIA REPAIR         Home Medications    Prior to Admission medications   Medication Sig Start Date End Date Taking? Authorizing Provider  allopurinol (ZYLOPRIM) 300 MG tablet TAKE 1 TABLET BY MOUTH EVERY DAY 11/04/23   Kerri Perches, MD  aspirin 81 MG EC tablet TAKE 1 TABLET EVERY DAY Patient taking differently: Take 81 mg by mouth daily. 02/12/14   Kerri Perches,  MD  cholecalciferol (VITAMIN D) 1000 UNITS tablet Take 1,000 Units by mouth daily.    [provider]  ELDERBERRY PO Take 1 tablet by mouth daily.     [provider]  fluticasone (FLONASE) 50 MCG/ACT nasal spray PLACE 2 SPRAYS INTO BOTH NOSTRILS 2 (TWO) TIMES DAILY 09/09/23   Kerri Perches, MD  furosemide (LASIX) 20 MG tablet TAKE 1 TABLET BY MOUTH EVERY DAY 11/05/23   Kerri Perches, MD  losartan (COZAAR) 25 MG tablet TAKE 1 TABLET BY MOUTH EVERY DAY 10/04/23   Kerri Perches, MD  meloxicam (MOBIC) 15 MG tablet TAKE 1 TABLET (15 MG TOTAL) BY MOUTH DAILY. 11/05/23   Hyatt, Max T, DPM  metFORMIN (GLUCOPHAGE) 500 MG tablet TAKE 1 TABLET BY MOUTH EVERY DAY WITH BREAKFAST 05/06/23   Kerri Perches, MD  Multiple Vitamin (MULTIVITAMIN WITH MINERALS) TABS tablet Take 1 tablet by mouth daily.    [provider]  NIFEdipine (PROCARDIA XL/NIFEDICAL-XL) 90 MG 24 hr tablet TAKE 1 TABLET BY MOUTH EVERY DAY 11/05/23   Kerri Perches, MD  potassium chloride (KLOR-CON) 10 MEQ tablet TAKE 2 TABLETS BY MOUTH EVERY DAY 12/07/23   Kerri Perches, MD  simvastatin (ZOCOR) 5 MG tablet TAKE 1 TABLET BY MOUTH EVERYDAY AT BEDTIME 06/14/23   Kerri Perches, MD  traMADol (ULTRAM) 50 MG tablet Take 1 tablet (50 mg total) by mouth every 6 (six) hours as needed. 02/11/23 02/11/24  Malen Gauze, MD    Family History Family History  Problem Relation Age of Onset   Coronary artery disease Brother 91       Premature   Hypertension Brother    Hypertension Father    Cancer Father 82       colon    Hypertension Mother    Diabetes Brother    Hypertension Sister    Hypertension Sister    Hypertension Sister    Hypertension Sister     Social History Social History   Tobacco Use   Smoking status: Former    Current packs/day: 0.50    Types: Cigarettes   Smokeless tobacco: Never  Vaping Use   Vaping status: Never Used  Substance Use Topics   Alcohol use:  No   Drug use: No     Allergies   Patient has no known allergies.   Review of Systems Review of Systems Per HPI  Physical Exam Triage Vital Signs ED Triage Vitals  Encounter Vitals Group     BP 12/12/23 1216 138/89     Systolic BP Percentile --      Diastolic BP Percentile --      Pulse Rate 12/12/23 1216 74     Resp 12/12/23 1216 20     Temp 12/12/23 1216 98.2 F (36.8 C)  Temp Source 12/12/23 1216 Oral     SpO2 12/12/23 1216 94 %     Weight --      Height --      Head Circumference --      Peak Flow --      Pain Score 12/12/23 1218 0     Pain Loc --      Pain Education --      Exclude from Growth Chart --    No data found.  Updated Vital Signs BP 138/89 (BP Location: Right Arm)   Pulse 74   Temp 98.2 F (36.8 C) (Oral)   Resp 20   SpO2 94%   Visual Acuity Right Eye Distance:   Left Eye Distance:   Bilateral Distance:    Right Eye Near:   Left Eye Near:    Bilateral Near:     Physical Exam Vitals and nursing note reviewed.  Constitutional:      General: He is not in acute distress.    Appearance: Normal appearance.  HENT:     Head: Normocephalic.  Eyes:     Extraocular Movements: Extraocular movements intact.     Pupils: Pupils are equal, round, and reactive to light.  Pulmonary:     Effort: Pulmonary effort is normal.  Musculoskeletal:     Cervical back: Normal range of motion.  Skin:    General: Skin is warm and dry.     Findings: Laceration present.     Comments: Vertical superficial laceration noted to the medial paronychia of the left thumb parallel to the thumbnail.  Area measures approximately 0.75 cm in length.  Bleeding is controlled at this time.  Neurovascular status is intact.  Site cleansed with Hibiclens soap and sterile water. No bleeding. Nailbed intact, patient tolerated well.  Neurological:     General: No focal deficit present.     Mental Status: He is alert and oriented to person, place, and time.  Psychiatric:         Mood and Affect: Mood normal.        Behavior: Behavior normal.      UC Treatments / Results  Labs (all labs ordered are listed, but only abnormal results are displayed) Labs Reviewed - No data to display  EKG   Radiology No results found.  Procedures Procedures (including critical care time)  Medications Ordered in UC Medications - No data to display  Initial Impression / Assessment and Plan / UC Course  I have reviewed the triage vital signs and the nursing notes.  Pertinent labs & imaging results that were available during my care of the patient were reviewed by me and considered in my medical decision making (see chart for details).  Patient with laceration to the left thumb.  Laceration is not amenable to sutures or dermabond at this time.  Tetanus status is up-to-date.  Site was cleansed and dressing was applied.  Supportive care recommendations were provided and discussed with the patient to include cleansing the area daily, use of over-the-counter analgesics, and monitoring for infection.  Patient was given strict follow-up precautions.  Patient was in agreement with this plan of care and verbalized understanding.  All questions were answered.  Patient stable for discharge.  Final Clinical Impressions(s) / UC Diagnoses   Final diagnoses:  Laceration of left thumb without foreign body without damage to nail, initial encounter     Discharge Instructions      Keep the dressing in place for the next 24 hours.  Cleanse the area twice daily with warm soap and water. You may apply Neosporin to the area as needed. Apply ice to the left thumb to help with pain and swelling.  Apply for 20 minutes, remove for 1 hour, repeat as needed. You may take over-the-counter Tylenol as needed for pain or discomfort. Keep the area covered when you are out in public.  When you are home, may leave the area open to air to promote healing.  If you have pets in the home, keep pets away from  your injury. Monitor the area for signs of infection.  This includes increased swelling, redness, or foul-smelling drainage from the site.  If you experience any of the symptoms, follow-up immediately. Follow-up as needed.     ED Prescriptions   None    PDMP not reviewed this encounter.   Abran Cantor, NP 12/12/23 1238    Abran Cantor, NP 12/12/23 1238

## 2023-12-22 LAB — HM DIABETES EYE EXAM

## 2023-12-23 NOTE — Progress Notes (Unsigned)
 Patient ID: Christopher Lambert, male    DOB: April 09, 1956, 67 y.o.   MRN: [id number]  HPI male never smoker followed for OSA, rhinitis, complicated by HBP, DM 2, morbid obesity NPSG 01/31/15- severe OSA AHI 49.4/ hr, CPAP suggested 13, AHI 0.0, weight 294 lb  -------------------------------------------------------------------------------   12/24/22-   68 year old male former smoker followed for OSA, rhinitis, complicated by HBP, DM 2, morbid obesity, Peripheral Venous Insufficiency, Prostate Cancer,  CPAP 10-20/Adapt                       Pressure ranging 15.9- 19.5 Download-compliance -100%, AHI 5.5/ hr Body weight today- 289 lbs Download reviewed.  He reports good experience with his CPAP, sleeping well with no concerns.  If he can keep his weight down I think we can avoid having to transition to BiPAP but we can watch this. He is pending radioactive seeds for prostate cancer.  12/24/23- 68 year old male former smoker followed for OSA, rhinitis, complicated by HTN, DM 2, morbid obesity, Peripheral Venous Insufficiency, Prostate Cancer,  CPAP 10-20/Adapt                      Download-compliance -100%, AHI 2.9/hr Body weight today- 276 lbs -----Sleep is good.  Using CPAP nightly.  Patients wife passed away 2 weeks ago. Discussed the use of AI scribe software for clinical note transcription with the patient, who gave verbal consent to proceed. History of Present Illness   The patient, with a history of sleep apnea, presents for a routine follow-up. He reports feeling "run down" and is adjusting to living alone after the recent loss of his wife (myeloma/ sepsis).  He has family support. He is using a CPAP machine, which is less than five years old, and the sleep apnea download shows an average of under three breakthrough apneas an hour. He reports no issues with breathing and is generally satisfied with his current health status. The patient also mentions experiencing some discomfort due to the heavy  pollen season.     ROS-see HPI + = positive Constitutional:   No-   weight loss, night sweats, fevers, chills, fatigue, lassitude. HEENT:   No-  headaches, difficulty swallowing, tooth/dental problems, sore throat,       No-  sneezing, itching, ear ache, nasal congestion, post nasal drip,  CV:  No-   chest pain, orthopnea, PND, +swelling in lower extremities, anasarca, dizziness, palpitations Resp: No-   shortness of breath with exertion or at rest.              No-   productive cough,  No non-productive cough,  No- coughing up of blood.              No-   change in color of mucus.  No- wheezing.   Skin: No-   rash or lesions. GI:  No-   heartburn, indigestion, abdominal pain, nausea, vomiting, GU:  MS:  No-   joint pain or swelling.   Neuro-     nothing unusual Psych:  No- change in mood or affect. No depression or anxiety.  No memory loss.  Objective:   Physical Exam General- Alert, Oriented, Affect-appropriate, Distress- none acute, +obese Skin- rash-none, lesions- none, excoriation- none Lymphadenopathy- none Head- atraumatic            Eyes- Gross vision intact, PERRLA, conjunctivae clear secretions            Ears- Hearing, canals-normal  Nose- Clear, no-Septal dev, mucus, polyps, erosion, perforation             Throat- Mallampati IV , mucosa clear, drainage- none, tonsils- atrophic Neck- flexible , trachea midline, no stridor , thyroid nl, carotid no bruit Chest - symmetrical excursion , unlabored           Heart/CV- RRR , no murmur , no gallop  , no rub, nl s1 s2                           - JVD- none , elastic hose+           Lung- clear to P&A, wheeze- none, cough- none , dullness-none, rub- none           Chest wall-  Abd-  Br/ Gen/ Rectal- Not done, not indicated Extrem- +elastic hose R leg Neuro- grossly intact to observation  Assessment and Plan:    Obstructive Sleep Apnea CPAP therapy effectively controls apnea with less than three events per hour. No  breathing issues reported. - Continue current CPAP therapy.  Seasonal Allergies Symptoms consistent with pollen exposure, expected to persist until end of May. - Consider antihistamines if symptoms worsen.

## 2023-12-24 ENCOUNTER — Ambulatory Visit: Payer: Medicare Other | Admitting: Internal Medicine

## 2023-12-24 ENCOUNTER — Encounter: Payer: Self-pay | Admitting: Internal Medicine

## 2023-12-24 VITALS — BP 126/62 | HR 85 | Temp 98.2°F | Ht 67.5 in | Wt 276.2 lb

## 2023-12-24 DIAGNOSIS — G4733 Obstructive sleep apnea (adult) (pediatric): Secondary | ICD-10-CM | POA: Diagnosis not present

## 2023-12-24 NOTE — Patient Instructions (Signed)
 I am sorry for your loss, Christopher Lambert  We don't need to change anything with your CPAP. Please et [place] know if we can help.

## 2023-12-30 ENCOUNTER — Encounter: Payer: Self-pay | Admitting: Internal Medicine

## 2024-02-24 ENCOUNTER — Telehealth: Payer: Self-pay | Admitting: Family Medicine

## 2024-02-24 NOTE — Telephone Encounter (Signed)
 Disability Placard Noted Copied Scanned Original in provider box Copy at front desk

## 2024-03-01 ENCOUNTER — Other Ambulatory Visit: Payer: Medicare Other

## 2024-03-01 DIAGNOSIS — C61 Malignant neoplasm of prostate: Secondary | ICD-10-CM

## 2024-03-02 LAB — PSA: Prostate Specific Ag, Serum: 0.6 ng/mL (ref 0.0–4.0)

## 2024-03-06 ENCOUNTER — Other Ambulatory Visit: Payer: Self-pay | Admitting: Podiatry

## 2024-03-07 ENCOUNTER — Ambulatory Visit: Payer: Self-pay | Admitting: Urology

## 2024-03-07 ENCOUNTER — Other Ambulatory Visit: Payer: Self-pay | Admitting: Family Medicine

## 2024-03-08 ENCOUNTER — Ambulatory Visit: Payer: Medicare Other | Admitting: Urology

## 2024-03-08 ENCOUNTER — Encounter: Payer: Self-pay | Admitting: Urology

## 2024-03-08 VITALS — BP 138/72 | HR 73

## 2024-03-08 DIAGNOSIS — N401 Enlarged prostate with lower urinary tract symptoms: Secondary | ICD-10-CM

## 2024-03-08 DIAGNOSIS — C61 Malignant neoplasm of prostate: Secondary | ICD-10-CM | POA: Diagnosis not present

## 2024-03-08 DIAGNOSIS — R350 Frequency of micturition: Secondary | ICD-10-CM

## 2024-03-08 DIAGNOSIS — N138 Other obstructive and reflux uropathy: Secondary | ICD-10-CM

## 2024-03-08 LAB — URINALYSIS, ROUTINE W REFLEX MICROSCOPIC
Bilirubin, UA: NEGATIVE
Glucose, UA: NEGATIVE
Ketones, UA: NEGATIVE
Leukocytes,UA: NEGATIVE
Nitrite, UA: NEGATIVE
Protein,UA: NEGATIVE
Specific Gravity, UA: 1.015 (ref 1.005–1.030)
Urobilinogen, Ur: 0.2 mg/dL (ref 0.2–1.0)
pH, UA: 7 (ref 5.0–7.5)

## 2024-03-08 LAB — MICROSCOPIC EXAMINATION
Bacteria, UA: NONE SEEN
WBC, UA: NONE SEEN /HPF (ref 0–5)

## 2024-03-08 NOTE — Patient Instructions (Signed)

## 2024-03-08 NOTE — Progress Notes (Signed)
 03/08/2024 9:56 AM   Christopher Lambert February 18, 1956 [id number]  Referring provider: Antonetta Rollene BRAVO, MD [address] [place],  [place] [address]  Followup prostate cancer   HPI: Christopher Lambert is a 68yo here for followup for prostate cancer and BPH. PSA decreased to 0.6 from 1.2. IPSS 2 QOL 1 on no BPH therapy. Urinary frequency every 2-3 hours. Nocturia 0x. Rare urinary urgency and no urge incontinence   PMH: Past Medical History:  Diagnosis Date   Abnormal cardiovascular function study 09/21/2011   Arthritis of knee    Bilateral   Cancer (HCC)    Diabetes mellitus 06/2008   type 2 NIDDM x 7 yrs; off meds   Diabetes mellitus without complication (HCC)    Phreesia 08/18/2020   ED (erectile dysfunction)    Essential hypertension, benign 1990   H/O: whooping cough    aschild   Hemorrhoids, internal    Hypertension    Phreesia 08/18/2020   Metabolic syndrome 01/23/2012   Obesity    Obstructive sleep apnea 11/09/2007   NPSG 08/26/04 AHI 92/hr, [place], [place] NPSG 01/31/15- severe OSA AHI 49.4/ hr, CPAP suggested 13, AHI 0.0, weight 294 lbs CPAP Advanced     Palpitations 08/24/2011   Piles (hemorrhoids) 06/19/2014   Seasonal allergies    Sleep apnea 2004   wears CPAP nightly      Surgical History: Past Surgical History:  Procedure Laterality Date   CARDIAC CATHETERIZATION  09/25/2011   CARPAL TUNNEL RELEASE     Right    COLONOSCOPY N/A 03/23/2018   Procedure: COLONOSCOPY;  Surgeon: Shaaron Lamar HERO, MD;  [facility];  Service: Endoscopy;  Laterality: N/A;  8:00   ENDOVENOUS ABLATION SAPHENOUS VEIN W/ LASER Right 10/10/2020   endovenous laser ablation right greater saphenous vein by Medford Blade MD    HERNIA REPAIR  2006 approx   umbilical   JOINT REPLACEMENT  2011   right knee   JOINT REPLACEMENT  2013   left knee   left knee replacement   sept 2013   POLYPECTOMY  03/23/2018   Procedure: POLYPECTOMY;  Surgeon: Shaaron Lamar HERO, MD;  [facility];  Service: Endoscopy;;  colon   RADIOACTIVE SEED IMPLANT N/A 02/11/2023   Procedure: RADIOACTIVE SEED IMPLANT/BRACHYTHERAPY IMPLANT;  Surgeon: Sherrilee Belvie CROME, MD;  Location: [facility];  Service: Urology;  Laterality: N/A;   Right knee replacement  06/2010   SPACE OAR INSTILLATION N/A 02/11/2023   Procedure: SPACE OAR INSTILLATION;  Surgeon: Sherrilee Belvie CROME, MD;  Location: [facility];  Service: Urology;  Laterality: N/A;   TOTAL KNEE ARTHROPLASTY  05/18/2012   Procedure: TOTAL KNEE ARTHROPLASTY;  Surgeon: Toribio JULIANNA Chancy, MD;  Location: [facility];  Service: Orthopedics;  Laterality: Left;  left total knee arthroplasty   UMBILICAL HERNIA REPAIR      Home Medications:  Allergies as of 03/08/2024   No Known Allergies      Medication List        Accurate as of March 08, 2024  9:56 AM. If you have any questions, ask your nurse or doctor.          allopurinol  300 MG tablet Commonly known as: ZYLOPRIM  TAKE 1 TABLET BY MOUTH EVERY DAY   aspirin  EC 81 MG tablet TAKE 1 TABLET EVERY DAY   cholecalciferol  1000 units tablet Commonly known as: VITAMIN D  Take 1,000 Units by mouth daily.   ELDERBERRY PO Take 1 tablet  by mouth daily.   fluticasone  50 MCG/ACT nasal spray Commonly known as: FLONASE  PLACE 2 SPRAYS INTO BOTH NOSTRILS 2 (TWO) TIMES DAILY   furosemide  20 MG tablet Commonly known as: LASIX  TAKE 1 TABLET BY MOUTH EVERY DAY   losartan  25 MG tablet Commonly known as: COZAAR  TAKE 1 TABLET BY MOUTH EVERY DAY   meloxicam  15 MG tablet Commonly known as: MOBIC  TAKE 1 TABLET (15 MG TOTAL) BY MOUTH DAILY.   metFORMIN  500 MG tablet Commonly known as: GLUCOPHAGE  TAKE 1 TABLET BY MOUTH EVERY DAY WITH BREAKFAST   multivitamin with minerals Tabs tablet Take 1 tablet by mouth daily.   NIFEdipine  90 MG 24 hr tablet Commonly known as: PROCARDIA  XL/NIFEDICAL-XL TAKE 1 TABLET BY MOUTH EVERY DAY   potassium chloride  10 MEQ tablet Commonly  known as: KLOR-CON  TAKE 2 TABLETS BY MOUTH EVERY DAY   simvastatin  5 MG tablet Commonly known as: ZOCOR  TAKE 1 TABLET BY MOUTH EVERYDAY AT BEDTIME        Allergies: No Known Allergies  Family History: Family History  Problem Relation Age of Onset   Coronary artery disease Brother 66       Premature   Hypertension Brother    Hypertension Father    Cancer Father 50       colon    Hypertension Mother    Diabetes Brother    Hypertension Sister    Hypertension Sister    Hypertension Sister    Hypertension Sister     Social History:  reports that he has never smoked. He has never used smokeless tobacco. He reports that he does not drink alcohol and does not use drugs.  ROS: All other review of systems were reviewed and are negative except what is noted above in HPI  Physical Exam: BP 138/72   Pulse 73   Constitutional:  Alert and oriented, No acute distress. HEENT: [place] AT, moist mucus membranes.  Trachea midline, no masses. Cardiovascular: No clubbing, cyanosis, or edema. Respiratory: Normal respiratory effort, no increased work of breathing. GI: Abdomen is soft, nontender, nondistended, no abdominal masses GU: No CVA tenderness.  Lymph: No cervical or inguinal lymphadenopathy. Skin: No rashes, bruises or suspicious lesions. Neurologic: Grossly intact, no focal deficits, moving all 4 extremities. Psychiatric: Normal mood and affect.  Laboratory Data: Lab Results  Component Value Date   WBC 3.3 (L) 11/12/2023   HGB 13.7 11/12/2023   HCT 41.0 11/12/2023   MCV 94 11/12/2023   PLT 210 11/12/2023    Lab Results  Component Value Date   CREATININE 1.04 11/12/2023    Lab Results  Component Value Date   PSA 2.7 11/30/2019   PSA 4.5 (H) 04/11/2019   PSA 3.0 09/20/2017    No results found for: TESTOSTERONE  Lab Results  Component Value Date   HGBA1C 6.3 (H) 11/12/2023    Urinalysis    Component Value Date/Time   COLORURINE AMBER (A) 04/16/2019 1642    APPEARANCEUR Clear 08/27/2023 1157   LABSPEC 1.019 04/16/2019 1642   PHURINE 5.0 04/16/2019 1642   GLUCOSEU Negative 08/27/2023 1157   HGBUR LARGE (A) 04/16/2019 1642   BILIRUBINUR Negative 08/27/2023 1157   KETONESUR NEGATIVE 04/16/2019 1642   PROTEINUR Negative 08/27/2023 1157   PROTEINUR 30 (A) 04/16/2019 1642   UROBILINOGEN 1.0 06/22/2016 1310   UROBILINOGEN 0.2 05/12/2012 0901   NITRITE Negative 08/27/2023 1157   NITRITE NEGATIVE 04/16/2019 1642   LEUKOCYTESUR Negative 08/27/2023 1157   LEUKOCYTESUR MODERATE (A) 04/16/2019 1642  Lab Results  Component Value Date   LABMICR See below: 08/27/2023   WBCUA None seen 08/27/2023   LABEPIT 0-10 08/27/2023   MUCUS Present 04/14/2022   BACTERIA None seen 08/27/2023    Pertinent Imaging:  No results found for this or any previous visit.  No results found for this or any previous visit.  No results found for this or any previous visit.  No results found for this or any previous visit.  No results found for this or any previous visit.  No results found for this or any previous visit.  No results found for this or any previous visit.  Results for orders placed during the hospital encounter of 04/16/19  CT RENAL STONE STUDY  Narrative CLINICAL DATA:  Weakness fever body aches  EXAM: CT ABDOMEN AND PELVIS WITHOUT CONTRAST  TECHNIQUE: Multidetector CT imaging of the abdomen and pelvis was performed following the standard protocol without IV contrast.  COMPARISON:  August 12, 2015  FINDINGS: The study is limited due to patient motion.  Lower chest: The visualized heart size within normal limits. No pericardial fluid/thickening.  No hiatal hernia.  The visualized portions of the lungs are clear.  Hepatobiliary: Although limited due to the lack of intravenous contrast, normal in appearance without gross focal abnormality. No evidence of calcified gallstones or biliary ductal dilatation.  Pancreas:   Unremarkable.  No surrounding inflammatory changes.  Spleen: Normal in size. Although limited due to the lack of intravenous contrast, normal in appearance.  Adrenals/Urinary Tract: Both adrenal glands appear normal. There is perinephric stranding seen around both kidneys and proximal ureteral stranding. Again noted are bilateral partially at exophytic low-density lesions, the largest is off the lower pole of the right kidney measuring 4.4 cm. The bladder is partially decompressed with diffuse bladder wall thickening. No calculi are noted.  Stomach/Bowel: The stomach, small bowel, and colon are normal in appearance. No inflammatory changes or obstructive findings. appendix is normal.  Vascular/Lymphatic: There are no enlarged abdominal or pelvic lymph nodes. No significant gross vascular findings are present.  Reproductive: The prostate is unremarkable.  Other: No evidence of abdominal wall mass or hernia.  Musculoskeletal: No acute or significant osseous findings.  IMPRESSION: 1. Bilateral perinephric and proximal periureteral stranding with diffuse mild bladder wall thickening. This could be due to bilateral pyelonephritis and chronic cystitis. 2. No renal calculi or hydronephrosis 3. Bilateral simple renal cysts.   Electronically Signed By: Merlyn Rajas M.D. On: 04/17/2019 17:11   Assessment & Plan:    1. Prostate cancer (HCC) (Primary) -followup 6 months with PSA - Urinalysis, Routine w reflex microscopic  2. Benign prostatic hyperplasia with urinary obstruction Patient defers therapy at this time  3. Urinary frequency Patient defers therapy at this time   No follow-ups on file.  Belvie Clara, MD  [facility] [place]

## 2024-03-19 ENCOUNTER — Other Ambulatory Visit: Payer: Self-pay | Admitting: Family Medicine

## 2024-03-22 ENCOUNTER — Ambulatory Visit: Admitting: Podiatry

## 2024-03-22 ENCOUNTER — Encounter: Payer: Self-pay | Admitting: Podiatry

## 2024-03-22 DIAGNOSIS — M79674 Pain in right toe(s): Secondary | ICD-10-CM | POA: Diagnosis not present

## 2024-03-22 DIAGNOSIS — B351 Tinea unguium: Secondary | ICD-10-CM

## 2024-03-22 DIAGNOSIS — M79675 Pain in left toe(s): Secondary | ICD-10-CM | POA: Diagnosis not present

## 2024-03-26 ENCOUNTER — Encounter: Payer: Self-pay | Admitting: Podiatry

## 2024-03-26 NOTE — Progress Notes (Signed)
  Subjective:  Patient ID: Christopher Lambert, male    DOB: 28-Jun-1956,  MRN: [id number]  68 y.o. male presents painful thick toenails that are difficult to trim. Pain interferes with ambulation. Aggravating factors include wearing enclosed shoe gear. Pain is relieved with periodic professional debridement. Sadly, he lost his wife four months ago to cancer. Chief Complaint  Patient presents with   Diabetes    [facility] NIDDM A1C 6.3 Toenail trim. Thick discolored nails.   New problem(s): None   PCP is Antonetta Rollene BRAVO, MD , and last visit was November 12, 2023.  No Known Allergies  Review of Systems: Negative except as noted in the HPI.   Objective:  Christopher Lambert is a pleasant 68 y.o. male WD, WN in NAD. AAO x 3.  Vascular Examination: Vascular status intact b/l with palpable pedal pulses. CFT immediate b/l. Pedal hair present. No edema. No pain with calf compression b/l. Skin temperature gradient WNL b/l. No varicosities noted. No cyanosis or clubbing noted.  Neurological Examination: Sensation grossly intact b/l with 10 gram monofilament. Vibratory sensation intact b/l.  Dermatological Examination: Pedal skin with normal turgor, texture and tone b/l. No open wounds nor interdigital macerations noted. Toenails 1-5 b/l thick, discolored, elongated with subungual debris and pain on dorsal palpation. No hyperkeratotic lesions noted b/l.   Musculoskeletal Examination: Muscle strength 5/5 to b/l LE.  No pain, crepitus noted b/l. No gross pedal deformities. Patient ambulates independently without assistive aids.   Radiographs: None  Last A1c:      Latest Ref Rng & Units 11/12/2023   10:44 AM 07/13/2023   10:32 AM  Hemoglobin A1C  Hemoglobin-A1c 4.8 - 5.6 % 6.3  6.6    Assessment:   1. Pain due to onychomycosis of toenails of both feet    Plan:  Patient was evaluated and treated. All patient's and/or POA's questions/concerns addressed on today's visit. Toenails 1-5 debrided in length  and girth without incident by assistant Andrez Manchester.. Continue soft, supportive shoe gear daily. Report any pedal injuries to medical professional. Call office if there are any questions/concerns. -Patient/POA to call should there be question/concern in the interim.  Return in about 3 months (around 06/22/2024).  Christopher Lambert, DPM      [facility] LOCATION: [address], [place] [address]                   Office [phone]   [facility] LOCATION: [address] [place], [place] [address] Office [phone]

## 2024-05-04 ENCOUNTER — Other Ambulatory Visit: Payer: Self-pay | Admitting: Family Medicine

## 2024-05-05 ENCOUNTER — Other Ambulatory Visit: Payer: Self-pay | Admitting: Family Medicine

## 2024-05-11 ENCOUNTER — Other Ambulatory Visit: Payer: Self-pay | Admitting: Family Medicine

## 2024-05-19 ENCOUNTER — Ambulatory Visit: Payer: Medicare Other | Admitting: Family Medicine

## 2024-05-19 ENCOUNTER — Other Ambulatory Visit (HOSPITAL_BASED_OUTPATIENT_CLINIC_OR_DEPARTMENT_OTHER): Payer: Self-pay

## 2024-05-19 ENCOUNTER — Encounter: Payer: Self-pay | Admitting: Family Medicine

## 2024-05-19 VITALS — BP 142/88 | HR 86 | Resp 16 | Ht 67.0 in | Wt 280.0 lb

## 2024-05-19 DIAGNOSIS — Z23 Encounter for immunization: Secondary | ICD-10-CM

## 2024-05-19 DIAGNOSIS — E1169 Type 2 diabetes mellitus with other specified complication: Secondary | ICD-10-CM

## 2024-05-19 DIAGNOSIS — E785 Hyperlipidemia, unspecified: Secondary | ICD-10-CM | POA: Diagnosis not present

## 2024-05-19 DIAGNOSIS — E790 Hyperuricemia without signs of inflammatory arthritis and tophaceous disease: Secondary | ICD-10-CM

## 2024-05-19 DIAGNOSIS — I10 Essential (primary) hypertension: Secondary | ICD-10-CM | POA: Diagnosis not present

## 2024-05-19 DIAGNOSIS — N529 Male erectile dysfunction, unspecified: Secondary | ICD-10-CM

## 2024-05-19 MED ORDER — SILDENAFIL CITRATE 100 MG PO TABS: 50.0000 mg | ORAL_TABLET | Freq: Every day | ORAL | 4 refills | Status: DC | PRN

## 2024-05-19 MED ORDER — SILDENAFIL CITRATE 100 MG PO TABS
50.0000 mg | ORAL_TABLET | Freq: Every day | ORAL | 4 refills | Status: DC | PRN
  Filled 2024-05-19: qty 5, 5d supply, fill #0

## 2024-05-19 NOTE — Patient Instructions (Addendum)
 F/U end January  Nurse bP check in 9 weeks  Flu vaccine today  Work on 6 pound weight loss please  Uric acid, lipid panel, hBA1C, cmp and eGFr, today   Viagra  is sent to 2 pharmacies for cost comp[arison and you will get a s saving s coupon  Thanks for choosing [facility], we consider it a privelige to serve you.

## 2024-05-20 LAB — LIPID PANEL
Chol/HDL Ratio: 3.2 ratio (ref 0.0–5.0)
Cholesterol, Total: 152 mg/dL (ref 100–199)
HDL: 48 mg/dL (ref >39)
LDL Chol Calc (NIH): 87 mg/dL (ref 0–99)
Triglycerides: 91 mg/dL (ref 0–149)
VLDL Cholesterol Cal: 17 mg/dL (ref 5–40)

## 2024-05-20 LAB — CMP14+EGFR
ALT: 19 IU/L (ref 0–44)
AST: 18 IU/L (ref 0–40)
Albumin: 4.1 g/dL (ref 3.9–4.9)
Alkaline Phosphatase: 46 IU/L (ref 44–121)
BUN/Creatinine Ratio: 12 (ref 10–24)
BUN: 13 mg/dL (ref 8–27)
Bilirubin Total: 0.6 mg/dL (ref 0.0–1.2)
CO2: 22 mmol/L (ref 20–29)
Calcium: 8.9 mg/dL (ref 8.6–10.2)
Chloride: 101 mmol/L (ref 96–106)
Creatinine, Ser: 1.1 mg/dL (ref 0.76–1.27)
Globulin, Total: 2.8 g/dL (ref 1.5–4.5)
Glucose: 112 mg/dL — ABNORMAL HIGH (ref 70–99)
Potassium: 3.8 mmol/L (ref 3.5–5.2)
Sodium: 140 mmol/L (ref 134–144)
Total Protein: 6.9 g/dL (ref 6.0–8.5)
eGFR: 74 mL/min/1.73 (ref >59)

## 2024-05-20 LAB — HEMOGLOBIN A1C
Est. average glucose Bld gHb Est-mCnc: 131 mg/dL
Hgb A1c MFr Bld: 6.2 % — ABNORMAL HIGH (ref 4.8–5.6)

## 2024-05-20 LAB — URIC ACID: Uric Acid: 3.6 mg/dL — ABNORMAL LOW (ref 3.8–8.4)

## 2024-05-21 ENCOUNTER — Encounter: Payer: Self-pay | Admitting: Family Medicine

## 2024-05-21 ENCOUNTER — Ambulatory Visit: Payer: Self-pay | Admitting: Family Medicine

## 2024-05-21 DIAGNOSIS — N529 Male erectile dysfunction, unspecified: Secondary | ICD-10-CM | POA: Insufficient documentation

## 2024-05-21 DIAGNOSIS — Z23 Encounter for immunization: Secondary | ICD-10-CM | POA: Insufficient documentation

## 2024-05-21 NOTE — Assessment & Plan Note (Signed)
 After obtaining informed consent, the influenza vaccine is  administered , with no adverse effect noted at the time of administration.

## 2024-05-21 NOTE — Progress Notes (Signed)
 Christopher Lambert     MRN: [id number]      DOB: 1955-12-29  Chief Complaint  Patient presents with   Medical Management of Chronic Issues    Follow up     HPI Christopher Lambert is here for follow up and re-evaluation of chronic medical conditions, medication management and review of any available recent lab and radiology data.  Preventive health is updated, specifically  Cancer screening and Immunization.   Still grieving loss of his wife after  valiant battle with cancer, overall satisfied with where he is at in the [ process trying yo keep engaged in ;ife Requests viagra  The PT denies any adverse reactions to current medications since the last visit.    ROS Denies recent fever or chills. Denies sinus pressure, nasal congestion, ear pain or sore throat. Denies chest congestion, productive cough or wheezing. Denies chest pains, palpitations and leg swelling Denies abdominal pain, nausea, vomiting,diarrhea or constipation.   Denies dysuria, frequency, hesitancy or incontinence. Denies uncontrolled  joint pain, swelling and limitation in mobility. Denies headaches, seizures, numbness, or tingling. Denies skin break down or rash.   PE  BP (!) 142/88   Pulse 86   Resp 16   Ht 5' 7 (1.702 m)   Wt 280 lb (127 kg)   SpO2 94%   BMI 43.85 kg/m   Patient alert and oriented and in no cardiopulmonary distress.  HEENT: No facial asymmetry, EOMI,     Neck supple .  Chest: Clear to auscultation bilaterally.  CVS: S1, S2 no murmurs, no S3.Regular rate.  ABD: Soft non tender.   Ext: No edema  MS: Adequate ROM spine, shoulders, hips and knees.  Skin: Intact, no ulcerations or rash noted.  Psych: Good eye contact, normal affect. Memory intact not anxious or depressed appearing.  CNS: CN 2-12 intact, power,  normal throughout.no focal deficits noted.   Assessment & Plan  Essential hypertension, benign Not at goal, no med change re eval in 9 weeks DASH diet and commitment to daily  physical activity for a minimum of 30 minutes discussed and encouraged, as a part of hypertension management. The importance of attaining a healthy weight is also discussed.     05/19/2024    9:40 AM 05/19/2024    8:55 AM 03/08/2024    9:49 AM 12/24/2023    9:30 AM 12/12/2023   12:16 PM 12/08/2023   10:15 AM 11/12/2023    9:39 AM  BP/Weight  Systolic BP 142 143 138 126 138  137  Diastolic BP 88 82 72 62 89  79  Wt. (Lbs)  280  276.2  272 272  BMI  43.85 kg/m2  42.62 kg/m2  41.97 kg/m2 41.97 kg/m2       DM type 2 with diabetic dyslipidemia (HCC) Diabetes associated with hypertension, hyperlipidemia, obesity, and arthritis  Christopher Lambert is reminded of the importance of commitment to daily physical activity for 30 minutes or more, as able and the need to limit carbohydrate intake to 30 to 60 grams per meal to help with blood sugar control.   The need to take medication as prescribed, test blood sugar as directed, and to call between visits if there is a concern that blood sugar is uncontrolled is also discussed.   Christopher Lambert is reminded of the importance of daily foot exam, annual eye examination, and good blood sugar, blood pressure and cholesterol control.     Latest Ref Rng & Units 05/19/2024    9:58 AM  11/12/2023   10:44 AM 07/13/2023   10:32 AM 02/11/2023    9:05 AM 01/29/2023    8:39 AM  Diabetic Labs  HbA1c 4.8 - 5.6 % 6.2  6.3  6.6   6.5   Micro/Creat Ratio 0 - 29 mg/g creat   6     Chol 100 - 199 mg/dL 847  862  858   843   HDL >39 mg/dL 48  41  41   40   Calc LDL 0 - 99 mg/dL 87  81  84   76   Triglycerides 0 - 149 mg/dL 91  78  80   759   Creatinine 0.76 - 1.27 mg/dL 8.89  8.95  8.98  9.09  1.06       05/19/2024    9:40 AM 05/19/2024    8:55 AM 03/08/2024    9:49 AM 12/24/2023    9:30 AM 12/12/2023   12:16 PM 12/08/2023   10:15 AM 11/12/2023    9:39 AM  BP/Weight  Systolic BP 142 143 138 126 138  137  Diastolic BP 88 82 72 62 89  79  Wt. (Lbs)  280  276.2  272 272  BMI  43.85  kg/m2  42.62 kg/m2  41.97 kg/m2 41.97 kg/m2      Latest Ref Rng & Units 12/22/2023   12:00 AM 02/25/2023   12:00 AM  Foot/eye exam completion dates  Eye Exam No Retinopathy No Retinopathy     No Retinopathy         This result is from an external source.      Impfroved control  Morbid obesity (HCC)  Patient re-educated about  the importance of commitment to a  minimum of 150 minutes of exercise per week as able.  The importance of healthy food choices with portion control discussed, as well as eating regularly and within a 12 hour window most days. The need to choose clean , green food 50 to 75% of the time is discussed, as well as to make water  the primary drink and set a goal of 64 ounces water  daily.       05/19/2024    8:55 AM 12/24/2023    9:30 AM 12/08/2023   10:15 AM  Weight /BMI  Weight 280 lb 276 lb 3.2 oz 272 lb  Height 5' 7 (1.702 m) 5' 7.5 (1.715 m) 5' 7.5 (1.715 m)  BMI 43.85 kg/m2 42.62 kg/m2 41.97 kg/m2    Beeds to work on weigh loss has had weigh gain  Hyperuricemia Reduce allopurinol  dose low uric acid level  Dyslipidemia, goal LDL below 100 Hyperlipidemia:Low fat diet discussed and encouraged.   Lipid Panel  Lab Results  Component Value Date   CHOL 152 05/19/2024   HDL 48 05/19/2024   LDLCALC 87 05/19/2024   TRIG 91 05/19/2024   CHOLHDL 3.2 05/19/2024     Controlled, no change in medication   ED (erectile dysfunction) of organic origin Viagra  100 mg half to one ttb as needed, is prescribed  Immunization due After obtaining informed consent, the influenza vaccine is  administered , with no adverse effect noted at the time of administration.

## 2024-05-21 NOTE — Assessment & Plan Note (Signed)
 Not at goal, no med change re eval in 9 weeks DASH diet and commitment to daily physical activity for a minimum of 30 minutes discussed and encouraged, as a part of hypertension management. The importance of attaining a healthy weight is also discussed.     05/19/2024    9:40 AM 05/19/2024    8:55 AM 03/08/2024    9:49 AM 12/24/2023    9:30 AM 12/12/2023   12:16 PM 12/08/2023   10:15 AM 11/12/2023    9:39 AM  BP/Weight  Systolic BP 142 143 138 126 138  137  Diastolic BP 88 82 72 62 89  79  Wt. (Lbs)  280  276.2  272 272  BMI  43.85 kg/m2  42.62 kg/m2  41.97 kg/m2 41.97 kg/m2

## 2024-05-21 NOTE — Assessment & Plan Note (Signed)
 Viagra  100 mg half to one ttb as needed, is prescribed

## 2024-05-21 NOTE — Assessment & Plan Note (Signed)
  Patient re-educated about  the importance of commitment to a  minimum of 150 minutes of exercise per week as able.  The importance of healthy food choices with portion control discussed, as well as eating regularly and within a 12 hour window most days. The need to choose clean , green food 50 to 75% of the time is discussed, as well as to make water  the primary drink and set a goal of 64 ounces water  daily.       05/19/2024    8:55 AM 12/24/2023    9:30 AM 12/08/2023   10:15 AM  Weight /BMI  Weight 280 lb 276 lb 3.2 oz 272 lb  Height 5' 7 (1.702 m) 5' 7.5 (1.715 m) 5' 7.5 (1.715 m)  BMI 43.85 kg/m2 42.62 kg/m2 41.97 kg/m2    Beeds to work on weigh loss has had weigh gain

## 2024-05-21 NOTE — Assessment & Plan Note (Signed)
 Diabetes associated with hypertension, hyperlipidemia, obesity, and arthritis  [name] is reminded of the importance of commitment to daily physical activity for 30 minutes or more, as able and the need to limit carbohydrate intake to 30 to 60 grams per meal to help with blood sugar control.   The need to take medication as prescribed, test blood sugar as directed, and to call between visits if there is a concern that blood sugar is uncontrolled is also discussed.   [name] is reminded of the importance of daily foot exam, annual eye examination, and good blood sugar, blood pressure and cholesterol control.     Latest Ref Rng & Units 05/19/2024    9:58 AM 11/12/2023   10:44 AM 07/13/2023   10:32 AM 02/11/2023    9:05 AM 01/29/2023    8:39 AM  Diabetic Labs  HbA1c 4.8 - 5.6 % 6.2  6.3  6.6   6.5   Micro/Creat Ratio 0 - 29 mg/g creat   6     Chol 100 - 199 mg/dL 847  862  858   843   HDL >39 mg/dL 48  41  41   40   Calc LDL 0 - 99 mg/dL 87  81  84   76   Triglycerides 0 - 149 mg/dL 91  78  80   759   Creatinine 0.76 - 1.27 mg/dL 8.89  8.95  8.98  9.09  1.06       05/19/2024    9:40 AM 05/19/2024    8:55 AM 03/08/2024    9:49 AM 12/24/2023    9:30 AM 12/12/2023   12:16 PM 12/08/2023   10:15 AM 11/12/2023    9:39 AM  BP/Weight  Systolic BP 142 143 138 126 138  137  Diastolic BP 88 82 72 62 89  79  Wt. (Lbs)  280  276.2  272 272  BMI  43.85 kg/m2  42.62 kg/m2  41.97 kg/m2 41.97 kg/m2      Latest Ref Rng & Units 12/22/2023   12:00 AM 02/25/2023   12:00 AM  Foot/eye exam completion dates  Eye Exam No Retinopathy No Retinopathy     No Retinopathy         This result is from an external source.      Impfroved control

## 2024-05-21 NOTE — Assessment & Plan Note (Signed)
 Hyperlipidemia:Low fat diet discussed and encouraged.   Lipid Panel  Lab Results  Component Value Date   CHOL 152 05/19/2024   HDL 48 05/19/2024   LDLCALC 87 05/19/2024   TRIG 91 05/19/2024   CHOLHDL 3.2 05/19/2024     Controlled, no change in medication

## 2024-05-21 NOTE — Assessment & Plan Note (Signed)
 Reduce allopurinol  dose low uric acid level

## 2024-06-03 ENCOUNTER — Other Ambulatory Visit: Payer: Self-pay | Admitting: Family Medicine

## 2024-06-27 ENCOUNTER — Other Ambulatory Visit: Payer: Self-pay | Admitting: Podiatry

## 2024-07-05 ENCOUNTER — Ambulatory Visit: Admitting: Podiatry

## 2024-07-05 ENCOUNTER — Encounter: Payer: Self-pay | Admitting: Podiatry

## 2024-07-05 DIAGNOSIS — M79675 Pain in left toe(s): Secondary | ICD-10-CM

## 2024-07-05 DIAGNOSIS — M79674 Pain in right toe(s): Secondary | ICD-10-CM | POA: Diagnosis not present

## 2024-07-05 DIAGNOSIS — B351 Tinea unguium: Secondary | ICD-10-CM

## 2024-07-11 NOTE — Progress Notes (Signed)
  Subjective:  Patient ID: Christopher Lambert, male    DOB: August 28, 1956,  MRN: [id number]  Christopher Lambert presents to clinic today for painful mycotic toenails x 10 which interfere with daily activities. Pain is relieved with periodic professional debridement.  Chief Complaint  Patient presents with   Diabetes    [facility] NIDDM A1C 6.2. Toenail trim. LOV with PCP 05/19/24.   New problem(s): None.   PCP is Antonetta Rollene BRAVO, MD.  No Known Allergies  Review of Systems: Negative except as noted in the HPI.  Objective: No changes noted in today's physical examination. There were no vitals filed for this visit. Christopher Lambert is a pleasant 68 y.o. male obese in NAD. AAO x 3.  Vascular Examination: Capillary refill time immediate b/l. Palpable pedal pulses. Pedal hair present b/l. Pedal edema absent. No pain with calf compression b/l. Skin temperature gradient WNL b/l. No cyanosis or clubbing b/l. No ischemia or gangrene noted b/l.   Neurological Examination: Sensation grossly intact b/l with 10 gram monofilament. Vibratory sensation intact b/l.   Dermatological Examination: Pedal skin with normal turgor, texture and tone b/l.  No open wounds. No interdigital macerations.   Toenails 1-5 b/l thick, discolored, elongated with subungual debris and pain on dorsal palpation.   No corns, calluses, nor porokeratotic lesions.  Musculoskeletal Examination: Muscle strength 5/5 to all lower extremity muscle groups bilaterally. No pain, crepitus or joint limitation noted with ROM bilateral LE. No gross bony deformities bilaterally. Patient ambulates independent of any assistive aids.  Radiographs: None  Last A1c:      Latest Ref Rng & Units 05/19/2024    9:58 AM 11/12/2023   10:44 AM 07/13/2023   10:32 AM  Hemoglobin A1C  Hemoglobin-A1c 4.8 - 5.6 % 6.2  6.3  6.6    Assessment/Plan: 1. Pain due to onychomycosis of toenails of both feet   Consent given for treatment. Patient examined. All patient's  and/or POA's questions/concerns addressed on today's visit. Mycotic toenails 1-5 b/l debrided in length and girth without incident.  Treatment was provided by assistant Andrez Manchester under my supervision. Continue soft, supportive shoe gear daily. Report any pedal injuries to medical professional. Call office if there are any questions/concerns. Patient/POA to call should there be question/concern in the interim.   Return in about 3 months (around 10/05/2024).  Delon LITTIE Merlin, DPM      [facility] LOCATION: [address] [place] [place], [place] [address]                   Office [phone]   [facility] LOCATION: [address] [place], [place] [address] Office [phone]

## 2024-07-21 ENCOUNTER — Ambulatory Visit

## 2024-07-21 NOTE — Progress Notes (Signed)
 Patient is in office today for a nurse visit for Blood Pressure Check. Patient blood pressure was 154/95, Patient No chest pain, No shortness of breath, No dyspnea on exertion, No orthopnea, No paroxysmal nocturnal dyspnea, No edema, No palpitations, No syncope

## 2024-08-05 ENCOUNTER — Other Ambulatory Visit: Payer: Self-pay | Admitting: Family Medicine

## 2024-08-07 ENCOUNTER — Ambulatory Visit: Payer: Medicare Other

## 2024-08-07 VITALS — Ht 67.5 in | Wt 280.0 lb

## 2024-08-07 DIAGNOSIS — E1169 Type 2 diabetes mellitus with other specified complication: Secondary | ICD-10-CM

## 2024-08-07 DIAGNOSIS — Z0000 Encounter for general adult medical examination without abnormal findings: Secondary | ICD-10-CM

## 2024-08-07 NOTE — Patient Instructions (Signed)
 Christopher Lambert,  Thank you for taking the time for your Medicare Wellness Visit. I appreciate your continued commitment to your health goals. Please review the care plan we discussed, and feel free to reach out if I can assist you further.  Please note that Annual Wellness Visits do not include a physical exam. Some assessments may be limited, especially if the visit was conducted virtually. If needed, we may recommend an in-person follow-up with your provider.  Ongoing Care Seeing your primary care provider every 3 to 6 months helps us  monitor your health and provide consistent, personalized care.   Referrals If a referral was made during today's visit and you haven't received any updates within two weeks, please contact the referred provider directly to check on the status.  Lab Orders         Microalbumin / creatinine urine ratio    Have these labs completed at [facility].   Recommended Screenings:  Health Maintenance  Topic Date Due   COVID-19 Vaccine (7 - 2025-26 season) 05/15/2024   Yearly kidney health urinalysis for diabetes  07/12/2024   Hemoglobin A1C  11/16/2024   Eye exam for diabetics  12/21/2024   DTaP/Tdap/Td vaccine (3 - Td or Tdap) 02/26/2025   Yearly kidney function blood test for diabetes  05/19/2025   Medicare Annual Wellness Visit  08/07/2025   Colon Cancer Screening  03/23/2028   Pneumococcal Vaccine for age over 41  Completed   Flu Shot  Completed   Hepatitis C Screening  Completed   Zoster (Shingles) Vaccine  Completed   Meningitis B Vaccine  Aged Out   Complete foot exam   Discontinued       08/07/2024   10:03 AM  Advanced Directives  Does Patient Have a Medical Advance Directive? No  Would patient like information on creating a medical advance directive? No - Patient declined    Vision: Annual vision screenings are recommended for early detection of glaucoma, cataracts, and diabetic retinopathy. These exams can also reveal signs of chronic  conditions such as diabetes and high blood pressure.  Dental: Annual dental screenings help detect early signs of oral cancer, gum disease, and other conditions linked to overall health, including heart disease and diabetes.  Please see the attached documents for additional preventive care recommendations.

## 2024-08-07 NOTE — Progress Notes (Signed)
 Chief Complaint  Patient presents with   Medicare Wellness     Subjective:   Christopher Lambert is a 68 y.o. male who presents for a Medicare Annual Wellness Visit.  Allergies (verified) Patient has no known allergies.   History: Past Medical History:  Diagnosis Date   Abnormal cardiovascular function study 09/21/2011   Allergy    Arthritis of knee    Bilateral   Cancer (HCC)    Cataract    Diabetes mellitus 06/2008   type 2 NIDDM x 7 yrs; off meds   Diabetes mellitus without complication (HCC)    Phreesia 08/18/2020   ED (erectile dysfunction)    Essential hypertension, benign 1990   H/O: whooping cough    aschild   Hemorrhoids, internal    Hypertension    Phreesia 08/18/2020   Metabolic syndrome 01/23/2012   Obesity    Obstructive sleep apnea 11/09/2007   NPSG 08/26/04 AHI 92/hr, [place], [place] NPSG 01/31/15- severe OSA AHI 49.4/ hr, CPAP suggested 13, AHI 0.0, weight 294 lbs CPAP Advanced     Palpitations 08/24/2011   Piles (hemorrhoids) 06/19/2014   Seasonal allergies    Sleep apnea 2004   wears CPAP nightly     Past Surgical History:  Procedure Laterality Date   CARDIAC CATHETERIZATION  09/25/2011   CARPAL TUNNEL RELEASE     Right    COLON SURGERY     COLONOSCOPY N/A 03/23/2018   Procedure: COLONOSCOPY;  Surgeon: Shaaron Lamar HERO, MD;  [facility];  Service: Endoscopy;  Laterality: N/A;  8:00   ENDOVENOUS ABLATION SAPHENOUS VEIN W/ LASER Right 10/10/2020   endovenous laser ablation right greater saphenous vein by Medford Blade MD    HERNIA REPAIR  2006 approx   umbilical   JOINT REPLACEMENT  2011   right knee   JOINT REPLACEMENT  2013   left knee   left knee replacement   05/2012   POLYPECTOMY  03/23/2018   Procedure: POLYPECTOMY;  Surgeon: Shaaron Lamar HERO, MD;  [facility];  Service: Endoscopy;;  colon   RADIOACTIVE SEED IMPLANT N/A 02/11/2023   Procedure: RADIOACTIVE SEED IMPLANT/BRACHYTHERAPY IMPLANT;  Surgeon: Sherrilee Belvie CROME,  MD;  Location: [facility];  Service: Urology;  Laterality: N/A;   Right knee replacement  06/2010   SPACE OAR INSTILLATION N/A 02/11/2023   Procedure: SPACE OAR INSTILLATION;  Surgeon: Sherrilee Belvie CROME, MD;  Location: [facility];  Service: Urology;  Laterality: N/A;   TOTAL KNEE ARTHROPLASTY  05/18/2012   Procedure: TOTAL KNEE ARTHROPLASTY;  Surgeon: Toribio JULIANNA Chancy, MD;  Location: [facility] OR;  Service: Orthopedics;  Laterality: Left;  left total knee arthroplasty   UMBILICAL HERNIA REPAIR     Family History  Problem Relation Age of Onset   Coronary artery disease Brother 12       Premature   Hypertension Brother    Hypertension Father    Cancer Father 81       colon    Hypertension Mother    Diabetes Brother    Hypertension Sister    Hypertension Sister    Hypertension Sister    Hypertension Sister    Social History   Occupational History    Employer: [employer]  Tobacco Use   Smoking status: Never   Smokeless tobacco: Never  Vaping Use   Vaping status: Never Used  Substance and Sexual Activity   Alcohol use: Yes   Drug use: No  Sexual activity: Yes   Tobacco Counseling Counseling given: Yes  SDOH Screenings   Food Insecurity: No Food Insecurity (08/07/2024)  Housing: Low Risk  (08/07/2024)  Transportation Needs: No Transportation Needs (08/07/2024)  Utilities: Not At Risk (08/07/2024)  Alcohol Screen: Low Risk  (08/07/2024)  Depression (PHQ2-9): Low Risk  (08/07/2024)  Financial Resource Strain: Low Risk  (08/07/2024)  Physical Activity: Sufficiently Active (08/07/2024)  Social Connections: Moderately Integrated (08/07/2024)  Stress: No Stress Concern Present (08/07/2024)  Tobacco Use: Low Risk  (08/07/2024)  Health Literacy: Adequate Health Literacy (08/07/2024)   See flowsheets for full screening details  Depression Screen PHQ 2 & 9 Depression Scale- Over the past 2 weeks, how often have you been bothered by any  of the following problems? Little interest or pleasure in doing things: 0 Feeling down, depressed, or hopeless (PHQ Adolescent also includes...irritable): 0 PHQ-2 Total Score: 0     Goals Addressed             This Visit's Progress    COMPLETED: HEMOGLOBIN A1C < 7       A1c goal is 6 or less     Lose 10-15 pounds and take care of my health         Visit info / Clinical Intake: Medicare Wellness Visit Type:: Subsequent Annual Wellness Visit Persons participating in visit:: patient Medicare Wellness Visit Mode:: Video Because this visit was a virtual/telehealth visit:: pt reported vitals If Telephone or Video please confirm:: I connected with the patient using audio enabled telemedicine application and verified that I am speaking with the correct person using two identifiers; The patient expressed understanding and agreed to proceed; I discussed the limitations of evaluation and management by telemedicine Patient Location:: home Provider Location:: office Information given by:: patient Interpreter Needed?: No Pre-visit prep was completed: yes AWV questionnaire completed by patient prior to visit?: yes Date:: 08/07/24 Living arrangements:: lives with spouse/significant other Patient's Overall Health Status Rating: good Typical amount of pain: none Does pain affect daily life?: no Are you currently prescribed opioids?: no  Dietary Habits and Nutritional Risks How many meals a day?: 3 Eats fruit and vegetables daily?: yes Most meals are obtained by: preparing own meals In the last 2 weeks, have you had any of the following?: none  Functional Status Activities of Daily Living (to include ambulation/medication): (Patient-Rptd) Independent Ambulation: (Patient-Rptd) Independent Medication Administration: Independent Home Management: (Patient-Rptd) Independent Manage your own finances?: yes Primary transportation is: driving Concerns about vision?: no *vision screening is  required for WTM* Concerns about hearing?: no  Fall Screening Falls in the past year?: (Patient-Rptd) 0 Number of falls in past year: (Patient-Rptd) 0 Was there an injury with Fall?: (Patient-Rptd) 0 Fall Risk Category Calculator: (Patient-Rptd) 0 Patient Fall Risk Level: (Patient-Rptd) Low Fall Risk  Fall Risk Patient at Risk for Falls Due to: No Fall Risks Fall risk Follow up: Falls evaluation completed; Education provided; Falls prevention discussed  Home and Transportation Safety: All rugs have non-skid backing?: N/A, no rugs All stairs or steps have railings?: yes Grab bars in the bathtub or shower?: yes Have non-skid surface in bathtub or shower?: yes Good home lighting?: yes Regular seat belt use?: yes Hospital stays in the last year:: no  Cognitive Assessment Difficulty concentrating, remembering, or making decisions? : no Will 6CIT or Mini Cog be Completed: no 6CIT or Mini Cog Declined: patient alert, oriented, able to answer questions appropriately and recall recent events  Advance Directives (For Healthcare) Does Patient Have a Medical  Advance Directive?: No Would patient like information on creating a medical advance directive?: No - Patient declined  Reviewed/Updated  Reviewed/Updated: Reviewed All (Medical, Surgical, Family, Medications, Allergies, Care Teams, Patient Goals)        Objective:    Today's Vitals   08/07/24 1034  Weight: 280 lb (127 kg)  Height: 5' 7.5 (1.715 m)   Body mass index is 43.21 kg/m.  Current Medications (verified) Outpatient Encounter Medications as of 08/07/2024  Medication Sig   allopurinol  (ZYLOPRIM ) 300 MG tablet TAKE 1 TABLET BY MOUTH EVERY DAY   aspirin  81 MG EC tablet TAKE 1 TABLET EVERY DAY (Patient taking differently: Take 81 mg by mouth daily.)   cholecalciferol  (VITAMIN D ) 1000 UNITS tablet Take 1,000 Units by mouth daily.   ELDERBERRY PO Take 1 tablet by mouth daily.    fluticasone  (FLONASE ) 50 MCG/ACT nasal  spray PLACE 2 SPRAYS INTO BOTH NOSTRILS 2 (TWO) TIMES DAILY   furosemide  (LASIX ) 20 MG tablet TAKE 1 TABLET BY MOUTH EVERY DAY   losartan  (COZAAR ) 25 MG tablet TAKE 1 TABLET BY MOUTH EVERY DAY   meloxicam  (MOBIC ) 15 MG tablet TAKE 1 TABLET (15 MG TOTAL) BY MOUTH DAILY.   metFORMIN  (GLUCOPHAGE ) 500 MG tablet TAKE 1 TABLET BY MOUTH EVERY DAY WITH BREAKFAST   Multiple Vitamin (MULTIVITAMIN WITH MINERALS) TABS tablet Take 1 tablet by mouth daily.   NIFEdipine  (PROCARDIA  XL/NIFEDICAL-XL) 90 MG 24 hr tablet TAKE 1 TABLET BY MOUTH EVERY DAY   potassium chloride  (KLOR-CON ) 10 MEQ tablet TAKE 2 TABLETS BY MOUTH EVERY DAY   sildenafil  (VIAGRA ) 100 MG tablet Take 0.5-1 tablets (50-100 mg total) by mouth daily as needed for erectile dysfunction.   sildenafil  (VIAGRA ) 100 MG tablet Take 0.5-1 tablets (50-100 mg total) by mouth daily as needed for erectile dysfunction.   simvastatin  (ZOCOR ) 5 MG tablet TAKE 1 TABLET BY MOUTH EVERYDAY AT BEDTIME   No facility-administered encounter medications on file as of 08/07/2024.   Hearing/Vision screen Hearing Screening - Comments:: Patient denies any hearing difficulties.   Vision Screening - Comments:: Wears rx glasses - up to date with routine eye exams with  [facility] Immunizations and Health Maintenance Health Maintenance  Topic Date Due   COVID-19 Vaccine (7 - 2025-26 season) 05/15/2024   Diabetic kidney evaluation - Urine ACR  07/12/2024   Medicare Annual Wellness (AWV)  08/03/2024   HEMOGLOBIN A1C  11/16/2024   OPHTHALMOLOGY EXAM  12/21/2024   DTaP/Tdap/Td (3 - Td or Tdap) 02/26/2025   Diabetic kidney evaluation - eGFR measurement  05/19/2025   Colonoscopy  03/23/2028   Pneumococcal Vaccine: 50+ Years  Completed   Influenza Vaccine  Completed   Hepatitis C Screening  Completed   Zoster Vaccines- Shingrix  Completed   Meningococcal B Vaccine  Aged Out   FOOT EXAM  Discontinued        Assessment/Plan:  This is a routine wellness  examination for Egidio.  Patient Care Team: Antonetta Rollene BRAVO, MD as PCP - General Neysa Reggy BIRCH, MD as Consulting Physician (Pulmonary Disease) Vertell Pont, RN as Oncology Nurse Navigator Gaynel Delon CROME, DPM as Consulting Physician (Podiatry) McKenzie, Belvie CROME, MD as Consulting Physician (Urology) Pa, [facility]) [place] Collar, MD as Consulting Physician (Ophthalmology)  I have personally reviewed and noted the following in the patient's chart:   Medical and social history Use of alcohol, tobacco or illicit drugs  Current medications and supplements including opioid prescriptions. Functional ability and status Nutritional status Physical  activity Advanced directives List of other physicians Hospitalizations, surgeries, and ER visits in previous 12 months Vitals Screenings to include cognitive, depression, and falls Referrals and appointments  Orders Placed This Encounter  Procedures   Microalbumin / creatinine urine ratio   In addition, I have reviewed and discussed with patient certain preventive protocols, quality metrics, and best practice recommendations. A written personalized care plan for preventive services as well as general preventive health recommendations were provided to patient.   Obie Silos, CMA   08/07/2024   No follow-ups on file.  After Visit Summary: (MyChart) Due to this being a telephonic visit, the after visit summary with patients personalized plan was offered to patient via MyChart   Nurse Notes: na

## 2024-08-26 ENCOUNTER — Other Ambulatory Visit: Payer: Self-pay | Admitting: Family Medicine

## 2024-08-31 ENCOUNTER — Other Ambulatory Visit

## 2024-08-31 DIAGNOSIS — C61 Malignant neoplasm of prostate: Secondary | ICD-10-CM

## 2024-09-01 ENCOUNTER — Other Ambulatory Visit: Payer: Self-pay | Admitting: Family Medicine

## 2024-09-01 LAB — PSA: Prostate Specific Ag, Serum: 1.4 ng/mL (ref 0.0–4.0)

## 2024-09-14 ENCOUNTER — Other Ambulatory Visit: Payer: Self-pay | Admitting: Family Medicine

## 2024-09-15 ENCOUNTER — Other Ambulatory Visit

## 2024-09-20 ENCOUNTER — Ambulatory Visit: Admitting: Urology

## 2024-09-20 VITALS — BP 148/76 | HR 102

## 2024-09-20 DIAGNOSIS — N529 Male erectile dysfunction, unspecified: Secondary | ICD-10-CM | POA: Diagnosis not present

## 2024-09-20 DIAGNOSIS — C61 Malignant neoplasm of prostate: Secondary | ICD-10-CM

## 2024-09-20 DIAGNOSIS — N5201 Erectile dysfunction due to arterial insufficiency: Secondary | ICD-10-CM

## 2024-09-20 LAB — URINALYSIS, ROUTINE W REFLEX MICROSCOPIC
Bilirubin, UA: NEGATIVE
Ketones, UA: NEGATIVE
Leukocytes,UA: NEGATIVE
Nitrite, UA: NEGATIVE
Protein,UA: NEGATIVE
Specific Gravity, UA: 1.025 (ref 1.005–1.030)
Urobilinogen, Ur: 0.2 mg/dL (ref 0.2–1.0)
pH, UA: 6 (ref 5.0–7.5)

## 2024-09-20 LAB — MICROSCOPIC EXAMINATION
Bacteria, UA: NONE SEEN
Epithelial Cells (non renal): NONE SEEN /HPF (ref 0–10)
WBC, UA: NONE SEEN /HPF (ref 0–5)

## 2024-09-20 MED ORDER — TADALAFIL 20 MG PO TABS: 20.0000 mg | ORAL_TABLET | ORAL | 5 refills | Status: AC | PRN

## 2024-09-20 NOTE — Progress Notes (Signed)
 "  09/20/2024 10:03 AM   Christopher Lambert 1956/05/10 [id number]  Referring provider: Antonetta Rollene BRAVO, MD [address] [place],  [place] [address]  Followup prostate cancer   HPI: Christopher Lambert is a 69yo here for followup for prostate cancer and erectile dysfunction . PSa increased to 1.4 from 0.6. He has issues maintaining an erection which are bothersome. IPSS 2 QOL 1. Urine stream strong. No straining to urinate. Nocturia 1x.    PMH: Past Medical History:  Diagnosis Date   Abnormal cardiovascular function study 09/21/2011   Allergy    Arthritis of knee    Bilateral   Cancer (HCC)    Cataract    Diabetes mellitus 06/2008   type 2 NIDDM x 7 yrs; off meds   Diabetes mellitus without complication (HCC)    Phreesia 08/18/2020   ED (erectile dysfunction)    Essential hypertension, benign 1990   H/O: whooping cough    aschild   Hemorrhoids, internal    Hypertension    Phreesia 08/18/2020   Metabolic syndrome 01/23/2012   Obesity    Obstructive sleep apnea 11/09/2007   NPSG 08/26/04 AHI 92/hr, [place], [place] NPSG 01/31/15- severe OSA AHI 49.4/ hr, CPAP suggested 13, AHI 0.0, weight 294 lbs CPAP Advanced     Palpitations 08/24/2011   Piles (hemorrhoids) 06/19/2014   Seasonal allergies    Sleep apnea 2004   wears CPAP nightly      Surgical History: Past Surgical History:  Procedure Laterality Date   CARDIAC CATHETERIZATION  09/25/2011   CARPAL TUNNEL RELEASE     Right    COLON SURGERY     COLONOSCOPY N/A 03/23/2018   Procedure: COLONOSCOPY;  Surgeon: Shaaron Lamar HERO, MD;  [facility];  Service: Endoscopy;  Laterality: N/A;  8:00   ENDOVENOUS ABLATION SAPHENOUS VEIN W/ LASER Right 10/10/2020   endovenous laser ablation right greater saphenous vein by Medford Blade MD    HERNIA REPAIR  2006 approx   umbilical   JOINT REPLACEMENT  2011   right knee   JOINT REPLACEMENT  2013   left knee   left knee replacement   05/2012   POLYPECTOMY  03/23/2018    Procedure: POLYPECTOMY;  Surgeon: Shaaron Lamar HERO, MD;  [facility];  Service: Endoscopy;;  colon   RADIOACTIVE SEED IMPLANT N/A 02/11/2023   Procedure: RADIOACTIVE SEED IMPLANT/BRACHYTHERAPY IMPLANT;  Surgeon: Sherrilee Belvie CROME, MD;  Location[facility];  Service: Urology;  Laterality: N/A;   Right knee replacement  06/2010   SPACE OAR INSTILLATION N/A 02/11/2023   Procedure: SPACE OAR INSTILLATION;  Surgeon: Sherrilee Belvie CROME, MD;  Location: [facility];  Service: Urology;  Laterality: N/A;   TOTAL KNEE ARTHROPLASTY  05/18/2012   Procedure: TOTAL KNEE ARTHROPLASTY;  Surgeon: Toribio JULIANNA Chancy, MD;  Location: [facility] OR;  Service: Orthopedics;  Laterality: Left;  left total knee arthroplasty   UMBILICAL HERNIA REPAIR      Home Medications:  Allergies as of 09/20/2024   No Known Allergies      Medication List        Accurate as of September 20, 2024 10:03 AM. If you have any questions, ask your nurse or doctor.          allopurinol  300 MG tablet Commonly known as: ZYLOPRIM  TAKE 1 TABLET BY MOUTH EVERY DAY   aspirin  EC 81 MG tablet TAKE 1 TABLET EVERY DAY   cholecalciferol  1000 units tablet Commonly known  as: VITAMIN D  Take 1,000 Units by mouth daily.   ELDERBERRY PO Take 1 tablet by mouth daily.   fluticasone  50 MCG/ACT nasal spray Commonly known as: FLONASE  PLACE 2 SPRAYS INTO BOTH NOSTRILS 2 (TWO) TIMES DAILY   furosemide  20 MG tablet Commonly known as: LASIX  TAKE 1 TABLET BY MOUTH EVERY DAY   losartan  25 MG tablet Commonly known as: COZAAR  TAKE 1 TABLET BY MOUTH EVERY DAY   meloxicam  15 MG tablet Commonly known as: MOBIC  TAKE 1 TABLET (15 MG TOTAL) BY MOUTH DAILY.   metFORMIN  500 MG tablet Commonly known as: GLUCOPHAGE  TAKE 1 TABLET BY MOUTH EVERY DAY WITH BREAKFAST   multivitamin with minerals Tabs tablet Take 1 tablet by mouth daily.   NIFEdipine  90 MG 24 hr tablet Commonly known as: PROCARDIA   XL/NIFEDICAL-XL TAKE 1 TABLET BY MOUTH EVERY DAY   potassium chloride  10 MEQ tablet Commonly known as: KLOR-CON  TAKE 2 TABLETS BY MOUTH EVERY DAY   sildenafil  100 MG tablet Commonly known as: Viagra  Take 0.5-1 tablets (50-100 mg total) by mouth daily as needed for erectile dysfunction.   sildenafil  100 MG tablet Commonly known as: Viagra  Take 0.5-1 tablets (50-100 mg total) by mouth daily as needed for erectile dysfunction.   simvastatin  5 MG tablet Commonly known as: ZOCOR  TAKE 1 TABLET BY MOUTH EVERYDAY AT BEDTIME        Allergies: Allergies[1]  Family History: Family History  Problem Relation Age of Onset   Coronary artery disease Brother 63       Premature   Hypertension Brother    Hypertension Father    Cancer Father 14       colon    Hypertension Mother    Diabetes Brother    Hypertension Sister    Hypertension Sister    Hypertension Sister    Hypertension Sister     Social History:  reports that he has never smoked. He has never used smokeless tobacco. He reports current alcohol use. He reports that he does not use drugs.  ROS: All other review of systems were reviewed and are negative except what is noted above in HPI  Physical Exam: BP (!) 148/76   Pulse (!) 102   Constitutional:  Alert and oriented, No acute distress. HEENT: [place] AT, moist mucus membranes.  Trachea midline, no masses. Cardiovascular: No clubbing, cyanosis, or edema. Respiratory: Normal respiratory effort, no increased work of breathing. GI: Abdomen is soft, nontender, nondistended, no abdominal masses GU: No CVA tenderness.  Lymph: No cervical or inguinal lymphadenopathy. Skin: No rashes, bruises or suspicious lesions. Neurologic: Grossly intact, no focal deficits, moving all 4 extremities. Psychiatric: Normal mood and affect.  Laboratory Data: Lab Results  Component Value Date   WBC 3.3 (L) 11/12/2023   HGB 13.7 11/12/2023   HCT 41.0 11/12/2023   MCV 94 11/12/2023   PLT 210  11/12/2023    Lab Results  Component Value Date   CREATININE 1.10 05/19/2024    Lab Results  Component Value Date   PSA 2.7 11/30/2019   PSA 4.5 (H) 04/11/2019   PSA 3.0 09/20/2017    No results found for: TESTOSTERONE   Lab Results  Component Value Date   HGBA1C 6.2 (H) 05/19/2024    Urinalysis    Component Value Date/Time   COLORURINE AMBER (A) 04/16/2019 1642   APPEARANCEUR Clear 03/08/2024 0951   LABSPEC 1.019 04/16/2019 1642   PHURINE 5.0 04/16/2019 1642   GLUCOSEU Negative 03/08/2024 0951   HGBUR LARGE (A) 04/16/2019 1642  BILIRUBINUR Negative 03/08/2024 0951   KETONESUR NEGATIVE 04/16/2019 1642   PROTEINUR Negative 03/08/2024 0951   PROTEINUR 30 (A) 04/16/2019 1642   UROBILINOGEN 1.0 06/22/2016 1310   UROBILINOGEN 0.2 05/12/2012 0901   NITRITE Negative 03/08/2024 0951   NITRITE NEGATIVE 04/16/2019 1642   LEUKOCYTESUR Negative 03/08/2024 0951   LEUKOCYTESUR MODERATE (A) 04/16/2019 1642    Lab Results  Component Value Date   LABMICR See below: 03/08/2024   WBCUA None seen 03/08/2024   LABEPIT 0-10 03/08/2024   MUCUS Present 04/14/2022   BACTERIA None seen 03/08/2024    Pertinent Imaging:  No results found for this or any previous visit.  No results found for this or any previous visit.  No results found for this or any previous visit.  No results found for this or any previous visit.  No results found for this or any previous visit.  No results found for this or any previous visit.  No results found for this or any previous visit.  Results for orders placed during the hospital encounter of 04/16/19  CT RENAL STONE STUDY  Narrative CLINICAL DATA:  Weakness fever body aches  EXAM: CT ABDOMEN AND PELVIS WITHOUT CONTRAST  TECHNIQUE: Multidetector CT imaging of the abdomen and pelvis was performed following the standard protocol without IV contrast.  COMPARISON:  August 12, 2015  FINDINGS: The study is limited due to patient  motion.  Lower chest: The visualized heart size within normal limits. No pericardial fluid/thickening.  No hiatal hernia.  The visualized portions of the lungs are clear.  Hepatobiliary: Although limited due to the lack of intravenous contrast, normal in appearance without gross focal abnormality. No evidence of calcified gallstones or biliary ductal dilatation.  Pancreas:  Unremarkable.  No surrounding inflammatory changes.  Spleen: Normal in size. Although limited due to the lack of intravenous contrast, normal in appearance.  Adrenals/Urinary Tract: Both adrenal glands appear normal. There is perinephric stranding seen around both kidneys and proximal ureteral stranding. Again noted are bilateral partially at exophytic low-density lesions, the largest is off the lower pole of the right kidney measuring 4.4 cm. The bladder is partially decompressed with diffuse bladder wall thickening. No calculi are noted.  Stomach/Bowel: The stomach, small bowel, and colon are normal in appearance. No inflammatory changes or obstructive findings. appendix is normal.  Vascular/Lymphatic: There are no enlarged abdominal or pelvic lymph nodes. No significant gross vascular findings are present.  Reproductive: The prostate is unremarkable.  Other: No evidence of abdominal wall mass or hernia.  Musculoskeletal: No acute or significant osseous findings.  IMPRESSION: 1. Bilateral perinephric and proximal periureteral stranding with diffuse mild bladder wall thickening. This could be due to bilateral pyelonephritis and chronic cystitis. 2. No renal calculi or hydronephrosis 3. Bilateral simple renal cysts.   Electronically Signed By: Merlyn Rajas M.D. On: 04/17/2019 17:11   Assessment & Plan:    1. Prostate cancer (HCC) (Primary) Followup 6 months with PSA - Urinalysis, Routine w reflex microscopic  2. Erectile dysfunction -tadalafil  20mg  prn   No follow-ups on  file.  Belvie Clara, MD  [facility] [place]       [1] No Known Allergies  "

## 2024-09-22 LAB — TESTOSTERONE,FREE AND TOTAL
Testosterone, Free: 7.2 pg/mL (ref 6.6–18.1)
Testosterone: 264 ng/dL (ref 264–916)

## 2024-09-25 ENCOUNTER — Telehealth: Payer: Self-pay

## 2024-09-25 NOTE — Telephone Encounter (Signed)
 Copied from CRM #[id number]. Topic: Appointments - Transfer of Care >> Sep 21, 2024  1:38 PM Rilla B wrote: Pt is requesting to transfer FROM: Dr Neysa Pt is requesting to transfer TO: Dr Catherine Reason for requested transfer: Dr Neysa Retired It is the responsibility of the team the patient would like to transfer to (Dr. Catherine) to reach out to the patient if for any reason this transfer is not acceptable. * Patient lives in [place] and requested seeing provider.

## 2024-09-26 ENCOUNTER — Encounter: Payer: Self-pay | Admitting: Urology

## 2024-09-26 NOTE — Patient Instructions (Signed)

## 2024-09-26 NOTE — Telephone Encounter (Signed)
 Copied from CRM #[id number]. Topic: Clinical - Order For Equipment >> Sep 21, 2024  1:41 PM Rilla B wrote: Reason for CRM: Patient of Dr Neysa. Would like to know how old his CPAP machine is. May be time for a new one per patietn...SABRASABRAI completed a TOC to Dr Catherine in [place].  Called and informed pt of how old his cpap machine is pt confirmed understanding

## 2024-09-27 NOTE — Telephone Encounter (Signed)
 Pt needs appt with Alghanim for sleep

## 2024-10-12 ENCOUNTER — Encounter: Payer: Self-pay | Admitting: Family Medicine

## 2024-10-12 ENCOUNTER — Ambulatory Visit: Admitting: Family Medicine

## 2024-10-12 VITALS — BP 130/82 | HR 87 | Resp 18 | Ht 67.5 in | Wt 287.1 lb

## 2024-10-12 DIAGNOSIS — M25551 Pain in right hip: Secondary | ICD-10-CM

## 2024-10-12 DIAGNOSIS — E785 Hyperlipidemia, unspecified: Secondary | ICD-10-CM | POA: Diagnosis not present

## 2024-10-12 DIAGNOSIS — I10 Essential (primary) hypertension: Secondary | ICD-10-CM

## 2024-10-12 DIAGNOSIS — Z6841 Body Mass Index (BMI) 40.0 and over, adult: Secondary | ICD-10-CM

## 2024-10-12 DIAGNOSIS — E559 Vitamin D deficiency, unspecified: Secondary | ICD-10-CM | POA: Diagnosis not present

## 2024-10-12 DIAGNOSIS — Z23 Encounter for immunization: Secondary | ICD-10-CM

## 2024-10-12 DIAGNOSIS — M545 Low back pain, unspecified: Secondary | ICD-10-CM | POA: Diagnosis not present

## 2024-10-12 DIAGNOSIS — E1169 Type 2 diabetes mellitus with other specified complication: Secondary | ICD-10-CM

## 2024-10-12 DIAGNOSIS — G8929 Other chronic pain: Secondary | ICD-10-CM | POA: Diagnosis not present

## 2024-10-12 DIAGNOSIS — E790 Hyperuricemia without signs of inflammatory arthritis and tophaceous disease: Secondary | ICD-10-CM | POA: Diagnosis not present

## 2024-10-12 MED ORDER — UNABLE TO FIND: 0 refills | Status: AC

## 2024-10-12 NOTE — Patient Instructions (Addendum)
 Annual exam in mid to end August  Nurse pls print rx for covid vaccine  for pt   You are referred to Orthopedics re right hip and leg pain  Labs today, Urine ACR, cBc, lipid, cmp and EGFr, TSH, vit D and uric acid and hBA1C  All the best for 2026  Keep active and make healthy food choices  Thanks for choosing [facility], we consider it a privelige to serve you.

## 2024-10-13 ENCOUNTER — Ambulatory Visit: Payer: Self-pay | Admitting: Family Medicine

## 2024-10-14 LAB — URIC ACID: Uric Acid: 4.9 mg/dL (ref 3.8–8.4)

## 2024-10-14 LAB — CBC WITH DIFFERENTIAL/PLATELET
Basophils Absolute: 0 10*3/uL (ref 0.0–0.2)
Basos: 1 %
EOS (ABSOLUTE): 0.3 10*3/uL (ref 0.0–0.4)
Eos: 5 %
Hematocrit: 45.7 % (ref 37.5–51.0)
Hemoglobin: 15.4 g/dL (ref 13.0–17.7)
Immature Grans (Abs): 0 10*3/uL (ref 0.0–0.1)
Immature Granulocytes: 0 %
Lymphocytes Absolute: 1.3 10*3/uL (ref 0.7–3.1)
Lymphs: 24 %
MCH: 32.4 pg (ref 26.6–33.0)
MCHC: 33.7 g/dL (ref 31.5–35.7)
MCV: 96 fL (ref 79–97)
Monocytes Absolute: 0.4 10*3/uL (ref 0.1–0.9)
Monocytes: 8 %
Neutrophils Absolute: 3.3 10*3/uL (ref 1.4–7.0)
Neutrophils: 62 %
Platelets: 244 10*3/uL (ref 150–450)
RBC: 4.75 x10E6/uL (ref 4.14–5.80)
RDW: 13.1 % (ref 11.6–15.4)
WBC: 5.3 10*3/uL (ref 3.4–10.8)

## 2024-10-14 LAB — CMP14+EGFR
ALT: 27 [IU]/L (ref 0–44)
AST: 21 [IU]/L (ref 0–40)
Albumin: 4.4 g/dL (ref 3.9–4.9)
Alkaline Phosphatase: 50 [IU]/L (ref 47–123)
BUN/Creatinine Ratio: 12 (ref 10–24)
BUN: 14 mg/dL (ref 8–27)
Bilirubin Total: 0.6 mg/dL (ref 0.0–1.2)
CO2: 24 mmol/L (ref 20–29)
Calcium: 9.5 mg/dL (ref 8.6–10.2)
Chloride: 102 mmol/L (ref 96–106)
Creatinine, Ser: 1.15 mg/dL (ref 0.76–1.27)
Globulin, Total: 3 g/dL (ref 1.5–4.5)
Glucose: 134 mg/dL — ABNORMAL HIGH (ref 70–99)
Potassium: 4.1 mmol/L (ref 3.5–5.2)
Sodium: 143 mmol/L (ref 134–144)
Total Protein: 7.4 g/dL (ref 6.0–8.5)
eGFR: 70 mL/min/{1.73_m2}

## 2024-10-14 LAB — MICROALBUMIN / CREATININE URINE RATIO
Creatinine, Urine: 49.7 mg/dL
Microalb/Creat Ratio: <6 mg/g{creat} (ref 0–29)
Microalbumin, Urine: <3 ug/mL

## 2024-10-14 LAB — HEMOGLOBIN A1C
Est. average glucose Bld gHb Est-mCnc: 134 mg/dL
Hgb A1c MFr Bld: 6.3 % — ABNORMAL HIGH (ref 4.8–5.6)

## 2024-10-14 LAB — LIPID PANEL
Chol/HDL Ratio: 3.4 ratio (ref 0.0–5.0)
Cholesterol, Total: 168 mg/dL (ref 100–199)
HDL: 49 mg/dL
LDL Chol Calc (NIH): 101 mg/dL — ABNORMAL HIGH (ref 0–99)
Triglycerides: 95 mg/dL (ref 0–149)
VLDL Cholesterol Cal: 18 mg/dL (ref 5–40)

## 2024-10-14 LAB — VITAMIN D 25 HYDROXY (VIT D DEFICIENCY, FRACTURES): Vit D, 25-Hydroxy: 35.4 ng/mL (ref 30.0–100.0)

## 2024-10-14 LAB — TSH: TSH: 2.19 u[IU]/mL (ref 0.450–4.500)

## 2024-10-15 ENCOUNTER — Encounter: Payer: Self-pay | Admitting: Family Medicine

## 2024-10-15 DIAGNOSIS — G8929 Other chronic pain: Secondary | ICD-10-CM | POA: Insufficient documentation

## 2024-10-15 NOTE — Assessment & Plan Note (Signed)
 Increased x 4 months radiates to right knee , disturbs sleep refer Ortho

## 2024-10-15 NOTE — Progress Notes (Signed)
 "  Christopher Lambert     MRN: [id number]      DOB: 18-Feb-1956  Chief Complaint  Patient presents with   Medical Management of Chronic Issues    Follow up    Leg Pain    Pt complains of ongoing right sided hip and leg pain, causing problems sleeping at night     HPI [name] is here for follow up and re-evaluation of chronic medical conditions, medication management and review of any available recent lab and radiology data.  Preventive health is updated, specifically  Cancer screening and Immunization.   C/o right hip and leg pain which at times disturbs sleep  ROS Denies recent fever or chills. Denies sinus pressure, nasal congestion, ear pain or sore throat. Denies chest congestion, productive cough or wheezing. Denies chest pains, palpitations and leg swelling Denies abdominal pain, nausea, vomiting,diarrhea or constipation.   Denies dysuria, frequency, hesitancy or incontinence. y. Denies headaches, seizures, numbness, or tingling. Denies depression, anxiety or insomnia. Denies skin break down or rash.   PE  BP 130/82   Pulse 87   Resp 18   Ht 5' 7.5 (1.715 m)   Wt 287 lb 1.3 oz (130.2 kg)   SpO2 95%   BMI 44.30 kg/m   Patient alert and oriented and in no cardiopulmonary distress.  HEENT: No facial asymmetry, EOMI,     Neck supple .  Chest: Clear to auscultation bilaterally.  CVS: S1, S2 no murmurs, no S3.Regular rate.  ABD: Soft non tender.   Ext: No edema  MS: decreased  ROM lumbar spine, right  hips and normal in left hip, shoulders and knees  Skin: Intact, no ulcerations or rash noted.  Psych: Good eye contact, normal affect. Memory intact not anxious or depressed appearing.  CNS: CN 2-12 intact, power,  normal throughout.no focal deficits noted.   Assessment & Plan  Right hip pain Increased x 4 months , disturbs sleep and ;limits ambulation , refer Ortho  Low back pain Increased x 4 months radiates to right knee , disturbs sleep refer  Ortho  Dyslipidemia, goal LDL below 100 Hyperlipidemia:Low fat diet discussed and encouraged.   Lipid Panel  Lab Results  Component Value Date   CHOL 168 10/12/2024   HDL 49 10/12/2024   LDLCALC 101 (H) 10/12/2024   TRIG 95 10/12/2024   CHOLHDL 3.4 10/12/2024     Needs to reduce fat in diet  Essential hypertension, benign DASH diet and commitment to daily physical activity for a minimum of 30 minutes discussed and encouraged, as a part of hypertension management. The importance of attaining a healthy weight is also discussed.     10/12/2024   10:00 AM 10/12/2024    9:12 AM 09/20/2024    9:51 AM 09/20/2024    9:50 AM 08/07/2024   10:34 AM 05/19/2024    9:40 AM 05/19/2024    8:55 AM  BP/Weight  Systolic BP 130 141 148 144 -- 142 143  Diastolic BP 82 77 76 82 -- 88 82  Wt. (Lbs)  287.08   280  280  BMI  44.3 kg/m2   43.21 kg/m2  43.85 kg/m2     Controlled, no change in medication   Morbid obesity (HCC)  Patient re-educated about  the importance of commitment to a  minimum of 150 minutes of exercise per week as able.  The importance of healthy food choices with portion control discussed, as well as eating regularly and within a 12 hour window  most days. The need to choose clean , green food 50 to 75% of the time is discussed, as well as to make water  the primary drink and set a goal of 64 ounces water  daily.       10/12/2024    9:12 AM 08/07/2024   10:34 AM 05/19/2024    8:55 AM  Weight /BMI  Weight 287 lb 1.3 oz 280 lb 280 lb  Height 5' 7.5 (1.715 m) 5' 7.5 (1.715 m) 5' 7 (1.702 m)  BMI 44.3 kg/m2 43.21 kg/m2 43.85 kg/m2    Deteriorated , needs to reduce calories  Hyperuricemia Updated lab needed at/ before next visit.   DM type 2 with diabetic dyslipidemia (HCC) Diabetes associated with hypertension, hyperlipidemia, obesity, and arthritis  [name] is reminded of the importance of commitment to daily physical activity for 30 minutes or more, as able and  the need to limit carbohydrate intake to 30 to 60 grams per meal to help with blood sugar control.   The need to take medication as prescribed, test blood sugar as directed, and to call between visits if there is a concern that blood sugar is uncontrolled is also discussed.   [name] is reminded of the importance of daily foot exam, annual eye examination, and good blood sugar, blood pressure and cholesterol control.     Latest Ref Rng & Units 10/12/2024   10:32 AM 05/19/2024    9:58 AM 11/12/2023   10:44 AM 07/13/2023   10:32 AM 02/11/2023    9:05 AM  Diabetic Labs  HbA1c 4.8 - 5.6 % 6.3  6.2  6.3  6.6    Micro/Creat Ratio 0 - 29 mg/g creat <6    6    Chol 100 - 199 mg/dL 831  847  862  858    HDL >39 mg/dL 49  48  41  41    Calc LDL 0 - 99 mg/dL 898  87  81  84    Triglycerides 0 - 149 mg/dL 95  91  78  80    Creatinine 0.76 - 1.27 mg/dL 8.84  8.89  8.95  8.98  0.90       10/12/2024   10:00 AM 10/12/2024    9:12 AM 09/20/2024    9:51 AM 09/20/2024    9:50 AM 08/07/2024   10:34 AM 05/19/2024    9:40 AM 05/19/2024    8:55 AM  BP/Weight  Systolic BP 130 141 148 144 -- 142 143  Diastolic BP 82 77 76 82 -- 88 82  Wt. (Lbs)  287.08   280  280  BMI  44.3 kg/m2   43.21 kg/m2  43.85 kg/m2      Latest Ref Rng & Units 12/22/2023   12:00 AM 02/25/2023   12:00 AM  Foot/eye exam completion dates  Eye Exam No Retinopathy No Retinopathy     No Retinopathy         This result is from an external source.       "

## 2024-10-15 NOTE — Assessment & Plan Note (Signed)
 Updated lab needed at/ before next visit.

## 2024-10-15 NOTE — Assessment & Plan Note (Signed)
 DASH diet and commitment to daily physical activity for a minimum of 30 minutes discussed and encouraged, as a part of hypertension management. The importance of attaining a healthy weight is also discussed.     10/12/2024   10:00 AM 10/12/2024    9:12 AM 09/20/2024    9:51 AM 09/20/2024    9:50 AM 08/07/2024   10:34 AM 05/19/2024    9:40 AM 05/19/2024    8:55 AM  BP/Weight  Systolic BP 130 141 148 144 -- 142 143  Diastolic BP 82 77 76 82 -- 88 82  Wt. (Lbs)  287.08   280  280  BMI  44.3 kg/m2   43.21 kg/m2  43.85 kg/m2     Controlled, no change in medication

## 2024-10-15 NOTE — Assessment & Plan Note (Signed)
" °  Patient re-educated about  the importance of commitment to a  minimum of 150 minutes of exercise per week as able.  The importance of healthy food choices with portion control discussed, as well as eating regularly and within a 12 hour window most days. The need to choose clean , green food 50 to 75% of the time is discussed, as well as to make water  the primary drink and set a goal of 64 ounces water  daily.       10/12/2024    9:12 AM 08/07/2024   10:34 AM 05/19/2024    8:55 AM  Weight /BMI  Weight 287 lb 1.3 oz 280 lb 280 lb  Height 5' 7.5 (1.715 m) 5' 7.5 (1.715 m) 5' 7 (1.702 m)  BMI 44.3 kg/m2 43.21 kg/m2 43.85 kg/m2    Deteriorated , needs to reduce calories "

## 2024-10-15 NOTE — Assessment & Plan Note (Signed)
 Diabetes associated with hypertension, hyperlipidemia, obesity, and arthritis  Christopher Lambert is reminded of the importance of commitment to daily physical activity for 30 minutes or more, as able and the need to limit carbohydrate intake to 30 to 60 grams per meal to help with blood sugar control.   The need to take medication as prescribed, test blood sugar as directed, and to call between visits if there is a concern that blood sugar is uncontrolled is also discussed.   Christopher Lambert is reminded of the importance of daily foot exam, annual eye examination, and good blood sugar, blood pressure and cholesterol control.     Latest Ref Rng & Units 10/12/2024   10:32 AM 05/19/2024    9:58 AM 11/12/2023   10:44 AM 07/13/2023   10:32 AM 02/11/2023    9:05 AM  Diabetic Labs  HbA1c 4.8 - 5.6 % 6.3  6.2  6.3  6.6    Micro/Creat Ratio 0 - 29 mg/g creat <6    6    Chol 100 - 199 mg/dL 831  847  862  858    HDL >39 mg/dL 49  48  41  41    Calc LDL 0 - 99 mg/dL 898  87  81  84    Triglycerides 0 - 149 mg/dL 95  91  78  80    Creatinine 0.76 - 1.27 mg/dL 8.84  8.89  8.95  8.98  0.90       10/12/2024   10:00 AM 10/12/2024    9:12 AM 09/20/2024    9:51 AM 09/20/2024    9:50 AM 08/07/2024   10:34 AM 05/19/2024    9:40 AM 05/19/2024    8:55 AM  BP/Weight  Systolic BP 130 141 148 144 -- 142 143  Diastolic BP 82 77 76 82 -- 88 82  Wt. (Lbs)  287.08   280  280  BMI  44.3 kg/m2   43.21 kg/m2  43.85 kg/m2      Latest Ref Rng & Units 12/22/2023   12:00 AM 02/25/2023   12:00 AM  Foot/eye exam completion dates  Eye Exam No Retinopathy No Retinopathy     No Retinopathy         This result is from an external source.

## 2024-10-15 NOTE — Assessment & Plan Note (Signed)
 Increased x 4 months , disturbs sleep and ;limits ambulation , refer Ortho

## 2024-10-15 NOTE — Assessment & Plan Note (Signed)
 Hyperlipidemia:Low fat diet discussed and encouraged.   Lipid Panel  Lab Results  Component Value Date   CHOL 168 10/12/2024   HDL 49 10/12/2024   LDLCALC 101 (H) 10/12/2024   TRIG 95 10/12/2024   CHOLHDL 3.4 10/12/2024     Needs to reduce fat in diet

## 2024-10-17 ENCOUNTER — Other Ambulatory Visit: Payer: Self-pay

## 2024-10-17 DIAGNOSIS — E785 Hyperlipidemia, unspecified: Secondary | ICD-10-CM

## 2024-10-17 DIAGNOSIS — E1169 Type 2 diabetes mellitus with other specified complication: Secondary | ICD-10-CM

## 2024-10-17 DIAGNOSIS — I10 Essential (primary) hypertension: Secondary | ICD-10-CM

## 2024-10-18 ENCOUNTER — Ambulatory Visit: Admitting: Podiatry

## 2024-10-18 ENCOUNTER — Encounter: Payer: Self-pay | Admitting: Podiatry

## 2024-10-18 DIAGNOSIS — E1169 Type 2 diabetes mellitus with other specified complication: Secondary | ICD-10-CM

## 2024-10-18 DIAGNOSIS — B351 Tinea unguium: Secondary | ICD-10-CM

## 2024-10-19 ENCOUNTER — Encounter: Payer: Self-pay | Admitting: Podiatry

## 2024-10-19 NOTE — Progress Notes (Signed)
"  °  Subjective:  Patient ID: Christopher Lambert, male    DOB: 13-Mar-1956,  MRN: [id number]  Christopher Lambert presents to clinic today for preventative diabetic foot care and painful thick toenails that are difficult to trim. Pain interferes with ambulation. Aggravating factors include wearing enclosed shoe gear. Pain is relieved with periodic professional debridement.  Chief Complaint  Patient presents with   Diabetes    DFC NIDDM A1C 6.3. Toenail trim. LOV with PCP 10/13/24.   New problem(s): None.   PCP is Antonetta Rollene BRAVO, MD.  Allergies[1]  Review of Systems: Negative except as noted in the HPI.  Objective: No changes noted in today's physical examination. There were no vitals filed for this visit. Christopher Lambert is a pleasant 69 y.o. male in NAD. AAO x 3.  Vascular Examination: Capillary refill time immediate b/l. Palpable pedal pulses. Pedal hair present b/l. Pedal edema absent. No pain with calf compression b/l. Skin temperature gradient WNL b/l. No cyanosis or clubbing b/l. No ischemia or gangrene noted b/l.   Neurological Examination: Sensation grossly intact b/l with 10 gram monofilament. Vibratory sensation intact b/l.   Dermatological Examination: Pedal skin with normal turgor, texture and tone b/l.  No open wounds. No interdigital macerations.   Toenails 1-5 b/l thick, discolored, elongated with subungual debris and pain on dorsal palpation.   No corns, calluses, nor porokeratotic lesions.  Musculoskeletal Examination: Muscle strength 5/5 to all lower extremity muscle groups bilaterally. No pain, crepitus or joint limitation noted with ROM bilateral LE. No gross bony deformities bilaterally. Patient ambulates independent of any assistive aids.  Radiographs: None  Assessment/Plan: 1. Pain due to onychomycosis of toenails of both feet   2. DM type 2 with diabetic dyslipidemia (HCC)   Patient was evaluated and treated. All patient's and/or POA's questions/concerns addressed  on today's visit. Toenails 1-5 b/l debrided in length and girth without incident. Treatment was provided by assistant Andrez Manchester under my supervision. Continue soft, supportive shoe gear daily. Report any pedal injuries to medical professional. Call office if there are any questions/concerns. Patient/POA to call should there be question/concern in the interim.   Return in about 3 months (around 01/15/2025).  Christopher Lambert, DPM      [facility] LOCATION: [address] [place] [address]                   Office [phone]   [facility] LOCATION: [address] [place], [place] [address] Office [phone]     [1] No Known Allergies  "

## 2024-12-22 ENCOUNTER — Ambulatory Visit: Admitting: Internal Medicine

## 2024-12-22 ENCOUNTER — Ambulatory Visit: Admitting: Pulmonary Disease

## 2025-01-23 ENCOUNTER — Ambulatory Visit: Admitting: Podiatry

## 2025-03-22 ENCOUNTER — Other Ambulatory Visit

## 2025-03-30 ENCOUNTER — Ambulatory Visit: Admitting: Urology

## 2025-05-03 ENCOUNTER — Encounter: Payer: Self-pay | Admitting: Family Medicine

## 2025-08-08 ENCOUNTER — Ambulatory Visit

## 2025-08-31 ENCOUNTER — Other Ambulatory Visit
